# Patient Record
Sex: Female | Born: 1950 | Race: White | Hispanic: No | Smoking: Former smoker
Health system: Southern US, Community
[De-identification: ages and names within clinical notes are randomized; demographics above are authoritative.]

## PROBLEM LIST (undated history)

## (undated) DIAGNOSIS — T8859XA Other complications of anesthesia, initial encounter: Secondary | ICD-10-CM

## (undated) DIAGNOSIS — R251 Tremor, unspecified: Secondary | ICD-10-CM

## (undated) DIAGNOSIS — M1990 Unspecified osteoarthritis, unspecified site: Secondary | ICD-10-CM

## (undated) DIAGNOSIS — R519 Headache, unspecified: Secondary | ICD-10-CM

## (undated) DIAGNOSIS — G709 Myoneural disorder, unspecified: Secondary | ICD-10-CM

## (undated) DIAGNOSIS — Z9889 Other specified postprocedural states: Secondary | ICD-10-CM

## (undated) DIAGNOSIS — R112 Nausea with vomiting, unspecified: Secondary | ICD-10-CM

## (undated) DIAGNOSIS — F419 Anxiety disorder, unspecified: Secondary | ICD-10-CM

## (undated) DIAGNOSIS — K219 Gastro-esophageal reflux disease without esophagitis: Secondary | ICD-10-CM

## (undated) DIAGNOSIS — I10 Essential (primary) hypertension: Secondary | ICD-10-CM

## (undated) DIAGNOSIS — Z8719 Personal history of other diseases of the digestive system: Secondary | ICD-10-CM

## (undated) HISTORY — PX: BUNIONECTOMY: SHX129

## (undated) HISTORY — PX: COLONOSCOPY: SHX174

---

## 1998-06-17 ENCOUNTER — Other Ambulatory Visit: Admission: RE | Admit: 1998-06-17 | Discharge: 1998-06-17 | Payer: Self-pay | Admitting: Internal Medicine

## 2001-07-12 HISTORY — PX: OTHER SURGICAL HISTORY: SHX169

## 2008-07-12 DIAGNOSIS — Z8711 Personal history of peptic ulcer disease: Secondary | ICD-10-CM

## 2008-07-12 HISTORY — DX: Personal history of peptic ulcer disease: Z87.11

## 2008-12-11 ENCOUNTER — Encounter (INDEPENDENT_AMBULATORY_CARE_PROVIDER_SITE_OTHER): Payer: Self-pay | Admitting: *Deleted

## 2009-08-15 ENCOUNTER — Telehealth: Payer: Self-pay | Admitting: Internal Medicine

## 2010-02-09 ENCOUNTER — Encounter (INDEPENDENT_AMBULATORY_CARE_PROVIDER_SITE_OTHER): Payer: Self-pay | Admitting: *Deleted

## 2010-03-24 ENCOUNTER — Encounter: Payer: Self-pay | Admitting: Internal Medicine

## 2010-03-24 ENCOUNTER — Telehealth: Payer: Self-pay | Admitting: Internal Medicine

## 2010-04-03 ENCOUNTER — Encounter (INDEPENDENT_AMBULATORY_CARE_PROVIDER_SITE_OTHER): Payer: Self-pay | Admitting: *Deleted

## 2010-04-15 ENCOUNTER — Encounter (INDEPENDENT_AMBULATORY_CARE_PROVIDER_SITE_OTHER): Payer: Self-pay | Admitting: *Deleted

## 2010-04-16 ENCOUNTER — Ambulatory Visit: Payer: Self-pay | Admitting: Internal Medicine

## 2010-04-22 ENCOUNTER — Telehealth: Payer: Self-pay | Admitting: Internal Medicine

## 2010-08-11 NOTE — Letter (Signed)
Summary: Previsit letter  [facility]  [address] [place], [place] [address]   Phone: [phone]  Fax: [phone]       02/09/2010 MRN: [id number]  Mckenzie Guerrero [address] [place], [place]  [address]  [place]  Dear Ms. [name],  Welcome to the Gastroenterology Division at [facility].    You are scheduled to see a nurse for your pre-procedure visit on March 19, 2010 at 4:00pm on the 3rd floor at [facility], [address].  We ask that you try to arrive at our office 15 minutes prior to your appointment time to allow for check-in.  Your nurse visit will consist of discussing your medical and surgical history, your immediate family medical history, and your medications.    Please bring a complete list of all your medications or, if you prefer, bring the medication bottles and we will list them.  We will need to be aware of both prescribed and over the counter drugs.  We will need to know exact dosage information as well.  If you are on blood thinners (Coumadin, Plavix, Aggrenox, Ticlid, etc.) please call our office today/prior to your appointment, as we need to consult with your physician about holding your medication.   Please be prepared to read and sign documents such as consent forms, a financial agreement, and acknowledgement forms.  If necessary, and with your consent, a friend or relative is welcome to sit-in on the nurse visit with you.  Please bring your insurance card so that we may make a copy of it.  If your insurance requires a referral to see a specialist, please bring your referral form from your primary care physician.  No co-pay is required for this nurse visit.     If you cannot keep your appointment, please call [phone] to cancel or reschedule prior to your appointment date.  This allows [place] the opportunity to schedule an appointment for another patient in need of care.    Thank you for choosing [facility] for your  medical needs.  We appreciate the opportunity to care for you.  Please visit [place] at our website  to learn more about our practice.                     Sincerely.                                                                                                                   The Gastroenterology Division

## 2010-08-11 NOTE — Miscellaneous (Signed)
Summary: [facility] previsit  Clinical Lists Changes  Medications: Added new medication of MOVIPREP 100 GM  SOLR (PEG-KCL-NACL-NASULF-NA ASC-C) As per prep instructions. - Signed Rx of MOVIPREP 100 GM  SOLR (PEG-KCL-NACL-NASULF-NA ASC-C) As per prep instructions.;  #1 x 0;  Signed;  Entered by: Karl Bales RN;  Authorized by: Hart Carwin MD;  Method used: Electronically to [facility]. [phone]*, [address], [place], [place], [place]  [address], Ph: [phone] or [phone], Fax: [phone] Allergies: Added new allergy or adverse reaction of CODEINE Added new allergy or adverse reaction of STEROIDS Added new allergy or adverse reaction of * SULFA DRUGS Observations: Added new observation of NKA: F (04/16/2010 15:59)    Prescriptions: MOVIPREP 100 GM  SOLR (PEG-KCL-NACL-NASULF-NA ASC-C) As per prep instructions.  #1 x 0   Entered by:   Karl Bales RN   Authorized by:   Hart Carwin MD   Signed by:   Karl Bales RN on 04/16/2010   Method used:   Electronically to        [facility]. [phone]* (retail)       [address]       [place], [place]  [address]       Ph: [phone] or [phone]       Fax: [phone]   RxID:   [id number]

## 2010-08-11 NOTE — Progress Notes (Signed)
Summary: Schedule recall colon  Phone Note Outgoing Call Call back at [facility] Phone [phone]   Call placed by: Christie Nottingham CMA Duncan Dull),  August 15, 2009 10:45 AM Call placed to: Patient Summary of Call:  called pt on 08/05/09 and left message for pt  to call back to schedule recall colon Initial call taken by: Christie Nottingham CMA Duncan Dull),  August 15, 2009 10:45 AM  Follow-up for Phone Call        left message for pt  to call back  Follow-up by: Christie Nottingham CMA Duncan Dull),  August 15, 2009 10:47 AM

## 2010-08-11 NOTE — Progress Notes (Signed)
Summary: triage  Phone Note Call from Patient Call back at [phone]   Caller: Patient Call For: Dr. Juanda Chance Reason for Call: Talk to Nurse Summary of Call: would like to discuss dehydration and possible GI bleed Initial call taken by: Vallarie Mare,  April 22, 2010 12:12 PM  Follow-up for Phone Call        Patient  was seen in the ER for an inner ear infection and dehydration.  She was told that she is anemic and asked could her procedure be moved up.  They drew labs this am and will call her with the results tomorrow.  I have asked her to have her primary care office fax [place] the results of the blood work.  She will keep her colon as scheduled for 05/04/10 Follow-up by: Darcey Nora RN, CGRN,  April 22, 2010 1:26 PM

## 2010-08-11 NOTE — Letter (Signed)
Summary: New Patient letter  [facility]  [address] [place], [place] [address]   Phone: [phone]  Fax: [phone]       03/24/2010 MRN: [id number]  Mckenzie Guerrero [address] [place], [place]  [address]  [place]  Dear Ms. [name],  Welcome to the Gastroenterology Division at [facility].    You are scheduled to see Dr. Juanda Chance  on 04/03/10 at 10:15 a.m.  on the 3rd floor at [facility], [address].  We ask that you try to arrive at our office 15 minutes prior to your appointment time to allow for check-in.  We would like you to complete the enclosed self-administered evaluation form prior to your visit and bring it with you on the day of your appointment.  We will review it with you.  Also, please bring a complete list of all your medications or, if you prefer, bring the medication bottles and we will list them.  Please bring your insurance card so that we may make a copy of it.  If your insurance requires a referral to see a specialist, please bring your referral form from your primary care physician.  Co-payments are due at the time of your visit and may be paid by cash, check or credit card.     Your office visit will consist of a consult with your physician (includes a physical exam), any laboratory testing he/she may order, scheduling of any necessary diagnostic testing (e.g. x-ray, ultrasound, CT-scan), and scheduling of a procedure (e.g. Endoscopy, Colonoscopy) if required.  Please allow enough time on your schedule to allow for any/all of these possibilities.    If you cannot keep your appointment, please call [phone] to cancel or reschedule prior to your appointment date.  This allows [place] the opportunity to schedule an appointment for another patient in need of care.  If you do not cancel or reschedule by 5 p.m. the business day prior to your appointment date, you will be charged a $50.00 late cancellation/no-show fee.    Thank you for  choosing [facility] for your medical needs.  We appreciate the opportunity to care for you.  Please visit [place] at our website  to learn more about our practice.                     Sincerely,                                                             The Gastroenterology Division

## 2010-08-11 NOTE — Progress Notes (Signed)
Summary: hemorrhoids  Phone Note Call from Patient Call back at [phone]   Caller: Patient Call For: Dr. Juanda Chance Reason for Call: Talk to Nurse Summary of Call: hemorrhoids bothering pt and wants to know if she should see Dr. Juanda Chance or get something OTC Initial call taken by: Vallarie Mare,  March 24, 2010 3:14 PM  Follow-up for Phone Call        Patient  is scheduled to see Dr Juanda Chance on 04/03/10 10:00.  New patient letter sent to the patient  Follow-up by: Darcey Nora RN, CGRN,  March 24, 2010 3:36 PM

## 2010-08-11 NOTE — Letter (Signed)
Summary: Pre Visit Letter Revised  [facility]  [address] [place], [place] [address]   Phone: [phone]  Fax: [phone]        04/03/2010 MRN: [id number] Mckenzie Guerrero [address] [place], [place]  [address]  [place]             Procedure Date:  05/04/2010   Welcome to the Gastroenterology Division at [facility].    You are scheduled to see a nurse for your pre-procedure visit on 04/16/2010 at 4:30PM on the 3rd floor at [facility], [address].  We ask that you try to arrive at our office 15 minutes prior to your appointment time to allow for check-in.  Please take a minute to review the attached form.  If you answer "Yes" to one or more of the questions on the first page, we ask that you call the person listed at your earliest opportunity.  If you answer "No" to all of the questions, please complete the rest of the form and bring it to your appointment.    Your nurse visit will consist of discussing your medical and surgical history, your immediate family medical history, and your medications.   If you are unable to list all of your medications on the form, please bring the medication bottles to your appointment and we will list them.  We will need to be aware of both prescribed and over the counter drugs.  We will need to know exact dosage information as well.    Please be prepared to read and sign documents such as consent forms, a financial agreement, and acknowledgement forms.  If necessary, and with your consent, a friend or relative is welcome to sit-in on the nurse visit with you.  Please bring your insurance card so that we may make a copy of it.  If your insurance requires a referral to see a specialist, please bring your referral form from your primary care physician.  No co-pay is required for this nurse visit.     If you cannot keep your appointment, please call [phone] to cancel or reschedule prior to your appointment date.  This  allows [place] the opportunity to schedule an appointment for another patient in need of care.    Thank you for choosing  Gastroenterology for your medical needs.  We appreciate the opportunity to care for you.  Please visit [place] at our website  to learn more about our practice.  Sincerely, The Gastroenterology Division

## 2010-08-11 NOTE — Letter (Signed)
Summary: [facility] Instructions  [facility]  [address] [place], [place] [address]   Phone: [phone]  Fax: [phone]       Mckenzie Guerrero    1951-07-08    MRN: [id number]        Procedure Day /Date: Monday 05-04-10     Arrival Time: 1:30 p.m.     Procedure Time: 2:30 p.m.     Location of Procedure:                      [facility] (4th Floor)  PREPARATION FOR COLONOSCOPY WITH MOVIPREP   Starting 5 days prior to your procedure  04-29-10 do not eat nuts, seeds, popcorn, corn, beans, peas,  salads, or any raw vegetables.  Do not take any fiber supplements (e.g. Metamucil, Citrucel, and Benefiber).  THE DAY BEFORE YOUR PROCEDURE         DATE:  05-03-10   DAY:  Sunday  1.  Drink clear liquids the entire day-NO SOLID FOOD  2.  Do not drink anything colored red or purple.  Avoid juices with pulp.  No orange juice.  3.  Drink at least 64 oz. (8 glasses) of fluid/clear liquids during the day to prevent dehydration and help the prep work efficiently.  CLEAR LIQUIDS INCLUDE: Water Jello Ice Popsicles Tea (sugar ok, no milk/cream) Powdered fruit flavored drinks Coffee (sugar ok, no milk/cream) Gatorade Juice: apple, white grape, white cranberry  Lemonade Clear bullion, consomm, broth Carbonated beverages (any kind) Strained chicken noodle soup Hard Candy                             4.  In the morning, mix first dose of MoviPrep solution:    Empty 1 Pouch A and 1 Pouch B into the disposable container    Add lukewarm drinking water to the top line of the container. Mix to dissolve    Refrigerate (mixed solution should be used within 24 hrs)  5.  Begin drinking the prep at 5:00 p.m. The MoviPrep container is divided by 4 marks.   Every 15 minutes drink the solution down to the next mark (approximately 8 oz) until the full liter is complete.   6.  Follow completed prep with 16 oz of clear liquid of your choice (Nothing red or purple).   Continue to drink clear liquids until bedtime.  7.  Before going to bed, mix second dose of MoviPrep solution:    Empty 1 Pouch A and 1 Pouch B into the disposable container    Add lukewarm drinking water to the top line of the container. Mix to dissolve    Refrigerate  THE DAY OF YOUR PROCEDURE      DATE:  05-04-10   DAY: Monday  Beginning at  9:30 a.m. (5 hours before procedure):         1. Every 15 minutes, drink the solution down to the next mark (approx 8 oz) until the full liter is complete.  2. Follow completed prep with 16 oz. of clear liquid of your choice.    3. You may drink clear liquids until  12:30 p.m. (2 HOURS BEFORE PROCEDURE).   MEDICATION INSTRUCTIONS  Unless otherwise instructed, you should take regular prescription medications with a small sip of water   as early as possible the morning of your procedure.  _         OTHER INSTRUCTIONS  You will need a responsible adult at least 60 years of age to accompany you and drive you home.   This person must remain in the waiting room during your procedure.  Wear loose fitting clothing that is easily removed.  Leave jewelry and other valuables at home.  However, you may wish to bring a book to read or  an iPod/MP3 player to listen to music as you wait for your procedure to start.  Remove all body piercing jewelry and leave at home.  Total time from sign-in until discharge is approximately 2-3 hours.  You should go home directly after your procedure and rest.  You can resume normal activities the  day after your procedure.  The day of your procedure you should not:   Drive   Make legal decisions   Operate machinery   Drink alcohol   Return to work  You will receive specific instructions about eating, activities and medications before you leave.    The above instructions have been reviewed and explained to me by   Karl Bales RN  April 16, 2010 4:49 PM    I fully understand and can  verbalize these instructions _____________________________ Date _________

## 2012-01-24 ENCOUNTER — Encounter: Payer: Self-pay | Admitting: Internal Medicine

## 2016-01-20 DIAGNOSIS — H25813 Combined forms of age-related cataract, bilateral: Secondary | ICD-10-CM | POA: Diagnosis not present

## 2016-01-20 DIAGNOSIS — H401122 Primary open-angle glaucoma, left eye, moderate stage: Secondary | ICD-10-CM | POA: Diagnosis not present

## 2016-01-20 DIAGNOSIS — H40011 Open angle with borderline findings, low risk, right eye: Secondary | ICD-10-CM | POA: Diagnosis not present

## 2016-03-12 DIAGNOSIS — I10 Essential (primary) hypertension: Secondary | ICD-10-CM | POA: Diagnosis not present

## 2016-03-12 DIAGNOSIS — H6123 Impacted cerumen, bilateral: Secondary | ICD-10-CM | POA: Diagnosis not present

## 2016-03-12 DIAGNOSIS — J0140 Acute pansinusitis, unspecified: Secondary | ICD-10-CM | POA: Diagnosis not present

## 2016-03-16 DIAGNOSIS — H401122 Primary open-angle glaucoma, left eye, moderate stage: Secondary | ICD-10-CM | POA: Diagnosis not present

## 2016-03-16 DIAGNOSIS — H25813 Combined forms of age-related cataract, bilateral: Secondary | ICD-10-CM | POA: Diagnosis not present

## 2016-03-16 DIAGNOSIS — H40011 Open angle with borderline findings, low risk, right eye: Secondary | ICD-10-CM | POA: Diagnosis not present

## 2016-05-03 DIAGNOSIS — Z111 Encounter for screening for respiratory tuberculosis: Secondary | ICD-10-CM | POA: Diagnosis not present

## 2016-05-03 DIAGNOSIS — Z23 Encounter for immunization: Secondary | ICD-10-CM | POA: Diagnosis not present

## 2016-09-16 DIAGNOSIS — H40013 Open angle with borderline findings, low risk, bilateral: Secondary | ICD-10-CM | POA: Diagnosis not present

## 2016-09-16 DIAGNOSIS — H25813 Combined forms of age-related cataract, bilateral: Secondary | ICD-10-CM | POA: Diagnosis not present

## 2016-10-29 DIAGNOSIS — R251 Tremor, unspecified: Secondary | ICD-10-CM | POA: Diagnosis not present

## 2016-10-29 DIAGNOSIS — E7800 Pure hypercholesterolemia, unspecified: Secondary | ICD-10-CM | POA: Diagnosis not present

## 2016-10-29 DIAGNOSIS — R002 Palpitations: Secondary | ICD-10-CM | POA: Diagnosis not present

## 2016-10-29 DIAGNOSIS — R001 Bradycardia, unspecified: Secondary | ICD-10-CM | POA: Diagnosis not present

## 2016-10-29 DIAGNOSIS — F5101 Primary insomnia: Secondary | ICD-10-CM | POA: Diagnosis not present

## 2016-10-29 DIAGNOSIS — F320 Major depressive disorder, single episode, mild: Secondary | ICD-10-CM | POA: Diagnosis not present

## 2016-10-29 DIAGNOSIS — I10 Essential (primary) hypertension: Secondary | ICD-10-CM | POA: Diagnosis not present

## 2016-10-29 DIAGNOSIS — K219 Gastro-esophageal reflux disease without esophagitis: Secondary | ICD-10-CM | POA: Diagnosis not present

## 2016-10-29 DIAGNOSIS — G43709 Chronic migraine without aura, not intractable, without status migrainosus: Secondary | ICD-10-CM | POA: Diagnosis not present

## 2017-03-10 DIAGNOSIS — M1712 Unilateral primary osteoarthritis, left knee: Secondary | ICD-10-CM | POA: Diagnosis not present

## 2017-03-17 DIAGNOSIS — M2242 Chondromalacia patellae, left knee: Secondary | ICD-10-CM | POA: Diagnosis not present

## 2017-04-08 DIAGNOSIS — H25813 Combined forms of age-related cataract, bilateral: Secondary | ICD-10-CM | POA: Diagnosis not present

## 2017-04-08 DIAGNOSIS — H40013 Open angle with borderline findings, low risk, bilateral: Secondary | ICD-10-CM | POA: Diagnosis not present

## 2017-04-11 ENCOUNTER — Encounter: Payer: Self-pay | Admitting: Podiatry

## 2017-04-11 ENCOUNTER — Ambulatory Visit (INDEPENDENT_AMBULATORY_CARE_PROVIDER_SITE_OTHER): Payer: PPO | Admitting: Podiatry

## 2017-04-11 ENCOUNTER — Encounter (INDEPENDENT_AMBULATORY_CARE_PROVIDER_SITE_OTHER): Payer: Self-pay

## 2017-04-11 ENCOUNTER — Ambulatory Visit (INDEPENDENT_AMBULATORY_CARE_PROVIDER_SITE_OTHER): Payer: PPO

## 2017-04-11 VITALS — BP 139/85 | HR 66 | Ht 63.0 in | Wt 153.0 lb

## 2017-04-11 DIAGNOSIS — M779 Enthesopathy, unspecified: Secondary | ICD-10-CM

## 2017-04-11 DIAGNOSIS — M21962 Unspecified acquired deformity of left lower leg: Secondary | ICD-10-CM

## 2017-04-11 DIAGNOSIS — M7742 Metatarsalgia, left foot: Secondary | ICD-10-CM | POA: Diagnosis not present

## 2017-04-11 MED ORDER — DICLOFENAC SODIUM 1 % TD GEL: 2.0000 g | Freq: Four times a day (QID) | TRANSDERMAL | 0 refills | Status: DC

## 2017-04-11 NOTE — Progress Notes (Signed)
   Subjective:    Patient ID: Mckenzie Guerrero, female    DOB: 1950/08/10, 66 y.o.   MRN: [id number] Chief Complaint  Patient presents with  . Foot Pain    pain in ball of left foot hx 3-4 mo gradually getting worse internittent pain     HPI  I have been having pain in the ball of my left foot for 3 or 4 months.   Reports pain in the ball of the left foot pain occurs in all shoe gear. Your bunion correction by Dr. Amalia Hailey greater than 10 years ago. Complains she believes her left second toe is starting to overlap the great toe.Denies other pedal issues  66 y.o. female presents with the above complaint.  No past medical history on file. No past surgical history on file.  Current Outpatient Prescriptions:  .  ALPRAZolam (XANAX) 0.5 MG tablet, Take one-half to one by mouth at HS prn, Disp: , Rfl:  .  omeprazole (PRILOSEC) 20 MG capsule, TAKE ONE CAPSULE BY MOUTH TWICE DAILY, Disp: , Rfl:  .  ramipril (ALTACE) 5 MG capsule, TAKE 1 CAPSULE BY MOUTH ONCE DAILY, Disp: , Rfl:  .  SUMAtriptan (IMITREX) 50 MG tablet, TAKE 1 TABLET BY MOUTH FOR MIGRAINE HEADACHE. MAY REPEAT EVERY 2 HOURS. MAXIMUM OF 4 TABLETS DAILY., Disp: , Rfl:  .  diclofenac sodium (VOLTAREN) 1 % GEL, Apply 2 g topically 4 (four) times daily. To painful areas., Disp: 100 g, Rfl: 0  Allergies  Allergen Reactions  . Codeine     REACTION: itch and bumps  . Doxycycline Nausea And Vomiting  . Nsaids Other (See Comments)    Gi bleeding  . Sulfonamide Derivatives     REACTION: swelling     Review of Systems  HENT: Positive for hearing loss and tinnitus.   Neurological: Positive for tremors.  All other systems reviewed and are negative.     Objective:   Physical Exam Vitals:   04/11/17 1413  BP: 139/85  Pulse: 66     General AA&O x3. Normal mood and affect.  Vascular Dorsalis pedis and posterior tibial pulses  present 2+ bilaterally  Capillary refill normal to all digits. Pedal hair growth normal.  Neurologic  Epicritic sensation grossly present.  Dermatologic No open lesions. Interspaces clear of maceration. Nails well groomed and normal in appearance. No hyperkeratotic lesions   Orthopedic: MMT 5/5 in dorsiflexion, plantarflexion, inversion, and eversion. Normal joint ROM without pain or crepitus. Pain on palpation plantar left second third fourth metatarsal heads evidence of prior bunion correction left foot. Residual HAV deformity present. No pain on range of motion left first MPJ. Decreased range of motion noted to the left first MPJ      Radiographs: Taken and reviewed. No acute fractures or dislocations. Assessment & Plan:  Capsulitis/metatarsalgia left -X-rays reviewed with the patient -Discussed etiology including abnormal metatarsal parabola -Metatarsal pads dispensed -Voltaren gel eRxed. Patient states that she cannot take oral NSAIDs -If pain is alleviated by padding will consider custom molded orthotics with metatarsal pads -Ultimately patient would benefit from second third and fourth metatarsal osteotomies to realign the metatarsal parabola. Would consider concomitant bunion revision and second hammertoe correction due to overlapping second toe deformity. Patient wishes to avoid surgery at this time  Return in about 3 weeks (around 05/02/2017).

## 2017-04-19 DIAGNOSIS — Z23 Encounter for immunization: Secondary | ICD-10-CM | POA: Diagnosis not present

## 2017-05-03 ENCOUNTER — Ambulatory Visit (INDEPENDENT_AMBULATORY_CARE_PROVIDER_SITE_OTHER): Payer: PPO | Admitting: Podiatry

## 2017-05-03 DIAGNOSIS — M7742 Metatarsalgia, left foot: Secondary | ICD-10-CM | POA: Diagnosis not present

## 2017-05-03 DIAGNOSIS — M779 Enthesopathy, unspecified: Secondary | ICD-10-CM | POA: Diagnosis not present

## 2017-05-03 NOTE — Progress Notes (Signed)
  Subjective:  Patient ID: Mckenzie Guerrero, female    DOB: [birth year],  MRN: [id number]  Chief Complaint  Patient presents with  . Foot Problem    Pain in the left foot is not any better    66 y.o. female returns for the above complaint.  Her pain is not any better.  The Voltaren gel has not been working.  Unable to take oral NSAIDs . Complains of bruising near her big toenail from her first and second toes rubbing  Objective:   General AA&O x3. Normal mood and affect.  Vascular Foot warm and well-perfused  Neurologic Epicritic sensation grossly present.  Dermatologic  skin irritation lateral aspect of the left hallux  Orthopedic: Pain on palpation plantar left second third fourth metatarsal heads evidence of prior bunion correction left foot. Residual HAV deformity present. No pain on range of motion left first MPJ. Decreased range of motion noted to the left first MPJ      Assessment & Plan:  Patient was evaluated and treated and all questions answered.  Capsulitis/metatarsalgia; HAV deformity -Again discussed with custom molded orthotics.  Patient declined. -Patient was offered prefabricated orthotics.  Patient declined. -Patient was offered surgical intervention.  Patient declined. -Padding dispensed including metatarsal pad and bunion shield left foot -Discussed with patient that due to structural deformities the options for alleviating her pain include accommodating for areas of high pressure with padding and/or orthotics versus realignment of the bones with surgery.  Patient declines both options.  Discussed with patient that there is not much more I can offer her system alleviating her pain since she has failed padding, anti-inflammatory medicines, shoegear changes.  15 minutes of face to face time were spent with the patient. >50% of this was spent on counseling and coordination of care. Specifically discussed with patient the above treatment plan.  Patient declining suggested  therapy.  Advised that pain will likely persist without intervention.  Patient verbalized understanding.

## 2017-05-05 DIAGNOSIS — Z111 Encounter for screening for respiratory tuberculosis: Secondary | ICD-10-CM | POA: Diagnosis not present

## 2017-06-06 DIAGNOSIS — I10 Essential (primary) hypertension: Secondary | ICD-10-CM | POA: Diagnosis not present

## 2017-06-06 DIAGNOSIS — H66001 Acute suppurative otitis media without spontaneous rupture of ear drum, right ear: Secondary | ICD-10-CM | POA: Diagnosis not present

## 2017-06-06 DIAGNOSIS — J0100 Acute maxillary sinusitis, unspecified: Secondary | ICD-10-CM | POA: Diagnosis not present

## 2017-06-06 DIAGNOSIS — H65192 Other acute nonsuppurative otitis media, left ear: Secondary | ICD-10-CM | POA: Diagnosis not present

## 2017-08-31 DIAGNOSIS — I10 Essential (primary) hypertension: Secondary | ICD-10-CM | POA: Diagnosis not present

## 2017-08-31 DIAGNOSIS — J3089 Other allergic rhinitis: Secondary | ICD-10-CM | POA: Diagnosis not present

## 2017-10-03 DIAGNOSIS — Z79899 Other long term (current) drug therapy: Secondary | ICD-10-CM | POA: Diagnosis not present

## 2017-10-03 DIAGNOSIS — R5383 Other fatigue: Secondary | ICD-10-CM | POA: Diagnosis not present

## 2017-10-03 DIAGNOSIS — E7800 Pure hypercholesterolemia, unspecified: Secondary | ICD-10-CM | POA: Diagnosis not present

## 2017-10-03 DIAGNOSIS — I10 Essential (primary) hypertension: Secondary | ICD-10-CM | POA: Diagnosis not present

## 2017-10-03 DIAGNOSIS — G43709 Chronic migraine without aura, not intractable, without status migrainosus: Secondary | ICD-10-CM | POA: Diagnosis not present

## 2017-10-03 DIAGNOSIS — K219 Gastro-esophageal reflux disease without esophagitis: Secondary | ICD-10-CM | POA: Diagnosis not present

## 2017-10-03 DIAGNOSIS — R5381 Other malaise: Secondary | ICD-10-CM | POA: Diagnosis not present

## 2017-10-03 DIAGNOSIS — F320 Major depressive disorder, single episode, mild: Secondary | ICD-10-CM | POA: Diagnosis not present

## 2017-10-06 DIAGNOSIS — H40013 Open angle with borderline findings, low risk, bilateral: Secondary | ICD-10-CM | POA: Diagnosis not present

## 2017-10-06 DIAGNOSIS — H25813 Combined forms of age-related cataract, bilateral: Secondary | ICD-10-CM | POA: Diagnosis not present

## 2017-11-16 DIAGNOSIS — M546 Pain in thoracic spine: Secondary | ICD-10-CM | POA: Diagnosis not present

## 2017-11-16 DIAGNOSIS — M545 Low back pain: Secondary | ICD-10-CM | POA: Diagnosis not present

## 2017-11-24 DIAGNOSIS — M4306 Spondylolysis, lumbar region: Secondary | ICD-10-CM | POA: Diagnosis not present

## 2017-11-24 DIAGNOSIS — M9905 Segmental and somatic dysfunction of pelvic region: Secondary | ICD-10-CM | POA: Diagnosis not present

## 2017-11-24 DIAGNOSIS — M4304 Spondylolysis, thoracic region: Secondary | ICD-10-CM | POA: Diagnosis not present

## 2017-11-24 DIAGNOSIS — M4308 Spondylolysis, sacral and sacrococcygeal region: Secondary | ICD-10-CM | POA: Diagnosis not present

## 2017-11-24 DIAGNOSIS — M9902 Segmental and somatic dysfunction of thoracic region: Secondary | ICD-10-CM | POA: Diagnosis not present

## 2017-11-24 DIAGNOSIS — M9903 Segmental and somatic dysfunction of lumbar region: Secondary | ICD-10-CM | POA: Diagnosis not present

## 2017-11-29 DIAGNOSIS — M9903 Segmental and somatic dysfunction of lumbar region: Secondary | ICD-10-CM | POA: Diagnosis not present

## 2017-11-29 DIAGNOSIS — M9902 Segmental and somatic dysfunction of thoracic region: Secondary | ICD-10-CM | POA: Diagnosis not present

## 2017-11-29 DIAGNOSIS — M9905 Segmental and somatic dysfunction of pelvic region: Secondary | ICD-10-CM | POA: Diagnosis not present

## 2017-11-29 DIAGNOSIS — M4304 Spondylolysis, thoracic region: Secondary | ICD-10-CM | POA: Diagnosis not present

## 2017-11-29 DIAGNOSIS — M4308 Spondylolysis, sacral and sacrococcygeal region: Secondary | ICD-10-CM | POA: Diagnosis not present

## 2017-11-29 DIAGNOSIS — M4306 Spondylolysis, lumbar region: Secondary | ICD-10-CM | POA: Diagnosis not present

## 2017-12-06 DIAGNOSIS — M4308 Spondylolysis, sacral and sacrococcygeal region: Secondary | ICD-10-CM | POA: Diagnosis not present

## 2017-12-06 DIAGNOSIS — M4304 Spondylolysis, thoracic region: Secondary | ICD-10-CM | POA: Diagnosis not present

## 2017-12-06 DIAGNOSIS — M9903 Segmental and somatic dysfunction of lumbar region: Secondary | ICD-10-CM | POA: Diagnosis not present

## 2017-12-06 DIAGNOSIS — M9905 Segmental and somatic dysfunction of pelvic region: Secondary | ICD-10-CM | POA: Diagnosis not present

## 2017-12-06 DIAGNOSIS — M4306 Spondylolysis, lumbar region: Secondary | ICD-10-CM | POA: Diagnosis not present

## 2017-12-06 DIAGNOSIS — M9902 Segmental and somatic dysfunction of thoracic region: Secondary | ICD-10-CM | POA: Diagnosis not present

## 2017-12-22 DIAGNOSIS — M4306 Spondylolysis, lumbar region: Secondary | ICD-10-CM | POA: Diagnosis not present

## 2017-12-22 DIAGNOSIS — M9902 Segmental and somatic dysfunction of thoracic region: Secondary | ICD-10-CM | POA: Diagnosis not present

## 2017-12-22 DIAGNOSIS — M9905 Segmental and somatic dysfunction of pelvic region: Secondary | ICD-10-CM | POA: Diagnosis not present

## 2017-12-22 DIAGNOSIS — M9903 Segmental and somatic dysfunction of lumbar region: Secondary | ICD-10-CM | POA: Diagnosis not present

## 2017-12-22 DIAGNOSIS — M4308 Spondylolysis, sacral and sacrococcygeal region: Secondary | ICD-10-CM | POA: Diagnosis not present

## 2017-12-22 DIAGNOSIS — M4304 Spondylolysis, thoracic region: Secondary | ICD-10-CM | POA: Diagnosis not present

## 2018-01-10 DIAGNOSIS — M5388 Other specified dorsopathies, sacral and sacrococcygeal region: Secondary | ICD-10-CM | POA: Diagnosis not present

## 2018-01-10 DIAGNOSIS — M4724 Other spondylosis with radiculopathy, thoracic region: Secondary | ICD-10-CM | POA: Diagnosis not present

## 2018-01-10 DIAGNOSIS — M9903 Segmental and somatic dysfunction of lumbar region: Secondary | ICD-10-CM | POA: Diagnosis not present

## 2018-01-10 DIAGNOSIS — M4726 Other spondylosis with radiculopathy, lumbar region: Secondary | ICD-10-CM | POA: Diagnosis not present

## 2018-01-10 DIAGNOSIS — M9904 Segmental and somatic dysfunction of sacral region: Secondary | ICD-10-CM | POA: Diagnosis not present

## 2018-01-10 DIAGNOSIS — M9902 Segmental and somatic dysfunction of thoracic region: Secondary | ICD-10-CM | POA: Diagnosis not present

## 2018-02-15 DIAGNOSIS — M5388 Other specified dorsopathies, sacral and sacrococcygeal region: Secondary | ICD-10-CM | POA: Diagnosis not present

## 2018-02-15 DIAGNOSIS — M9904 Segmental and somatic dysfunction of sacral region: Secondary | ICD-10-CM | POA: Diagnosis not present

## 2018-02-15 DIAGNOSIS — M9902 Segmental and somatic dysfunction of thoracic region: Secondary | ICD-10-CM | POA: Diagnosis not present

## 2018-02-15 DIAGNOSIS — M4306 Spondylolysis, lumbar region: Secondary | ICD-10-CM | POA: Diagnosis not present

## 2018-02-15 DIAGNOSIS — M4304 Spondylolysis, thoracic region: Secondary | ICD-10-CM | POA: Diagnosis not present

## 2018-02-15 DIAGNOSIS — M9903 Segmental and somatic dysfunction of lumbar region: Secondary | ICD-10-CM | POA: Diagnosis not present

## 2018-03-09 DIAGNOSIS — L570 Actinic keratosis: Secondary | ICD-10-CM | POA: Diagnosis not present

## 2018-03-09 DIAGNOSIS — C4441 Basal cell carcinoma of skin of scalp and neck: Secondary | ICD-10-CM | POA: Diagnosis not present

## 2018-03-09 DIAGNOSIS — L209 Atopic dermatitis, unspecified: Secondary | ICD-10-CM | POA: Diagnosis not present

## 2018-03-09 DIAGNOSIS — D2239 Melanocytic nevi of other parts of face: Secondary | ICD-10-CM | POA: Diagnosis not present

## 2018-03-09 DIAGNOSIS — C44311 Basal cell carcinoma of skin of nose: Secondary | ICD-10-CM | POA: Diagnosis not present

## 2018-03-23 DIAGNOSIS — Z111 Encounter for screening for respiratory tuberculosis: Secondary | ICD-10-CM | POA: Diagnosis not present

## 2018-03-30 DIAGNOSIS — M9901 Segmental and somatic dysfunction of cervical region: Secondary | ICD-10-CM | POA: Diagnosis not present

## 2018-03-30 DIAGNOSIS — M4306 Spondylolysis, lumbar region: Secondary | ICD-10-CM | POA: Diagnosis not present

## 2018-03-30 DIAGNOSIS — M4722 Other spondylosis with radiculopathy, cervical region: Secondary | ICD-10-CM | POA: Diagnosis not present

## 2018-03-30 DIAGNOSIS — M9903 Segmental and somatic dysfunction of lumbar region: Secondary | ICD-10-CM | POA: Diagnosis not present

## 2018-03-30 DIAGNOSIS — M4304 Spondylolysis, thoracic region: Secondary | ICD-10-CM | POA: Diagnosis not present

## 2018-03-30 DIAGNOSIS — M9902 Segmental and somatic dysfunction of thoracic region: Secondary | ICD-10-CM | POA: Diagnosis not present

## 2018-04-04 DIAGNOSIS — D044 Carcinoma in situ of skin of scalp and neck: Secondary | ICD-10-CM | POA: Diagnosis not present

## 2018-04-06 DIAGNOSIS — H40013 Open angle with borderline findings, low risk, bilateral: Secondary | ICD-10-CM | POA: Diagnosis not present

## 2018-04-06 DIAGNOSIS — H25813 Combined forms of age-related cataract, bilateral: Secondary | ICD-10-CM | POA: Diagnosis not present

## 2018-04-10 DIAGNOSIS — E7800 Pure hypercholesterolemia, unspecified: Secondary | ICD-10-CM | POA: Diagnosis not present

## 2018-04-10 DIAGNOSIS — I10 Essential (primary) hypertension: Secondary | ICD-10-CM | POA: Diagnosis not present

## 2018-04-10 DIAGNOSIS — G43709 Chronic migraine without aura, not intractable, without status migrainosus: Secondary | ICD-10-CM | POA: Diagnosis not present

## 2018-04-10 DIAGNOSIS — Z23 Encounter for immunization: Secondary | ICD-10-CM | POA: Diagnosis not present

## 2018-04-10 DIAGNOSIS — K219 Gastro-esophageal reflux disease without esophagitis: Secondary | ICD-10-CM | POA: Diagnosis not present

## 2018-04-10 DIAGNOSIS — R5381 Other malaise: Secondary | ICD-10-CM | POA: Diagnosis not present

## 2018-04-10 DIAGNOSIS — R5383 Other fatigue: Secondary | ICD-10-CM | POA: Diagnosis not present

## 2018-04-10 DIAGNOSIS — F5101 Primary insomnia: Secondary | ICD-10-CM | POA: Diagnosis not present

## 2018-04-10 DIAGNOSIS — J3089 Other allergic rhinitis: Secondary | ICD-10-CM | POA: Diagnosis not present

## 2018-04-10 DIAGNOSIS — R251 Tremor, unspecified: Secondary | ICD-10-CM | POA: Diagnosis not present

## 2018-04-10 DIAGNOSIS — Z0000 Encounter for general adult medical examination without abnormal findings: Secondary | ICD-10-CM | POA: Diagnosis not present

## 2018-04-10 DIAGNOSIS — F320 Major depressive disorder, single episode, mild: Secondary | ICD-10-CM | POA: Diagnosis not present

## 2018-04-24 DIAGNOSIS — C44311 Basal cell carcinoma of skin of nose: Secondary | ICD-10-CM | POA: Diagnosis not present

## 2018-05-16 DIAGNOSIS — M4306 Spondylolysis, lumbar region: Secondary | ICD-10-CM | POA: Diagnosis not present

## 2018-05-16 DIAGNOSIS — M9902 Segmental and somatic dysfunction of thoracic region: Secondary | ICD-10-CM | POA: Diagnosis not present

## 2018-05-16 DIAGNOSIS — M9903 Segmental and somatic dysfunction of lumbar region: Secondary | ICD-10-CM | POA: Diagnosis not present

## 2018-05-16 DIAGNOSIS — M4304 Spondylolysis, thoracic region: Secondary | ICD-10-CM | POA: Diagnosis not present

## 2018-05-16 DIAGNOSIS — M9904 Segmental and somatic dysfunction of sacral region: Secondary | ICD-10-CM | POA: Diagnosis not present

## 2018-05-16 DIAGNOSIS — M5388 Other specified dorsopathies, sacral and sacrococcygeal region: Secondary | ICD-10-CM | POA: Diagnosis not present

## 2018-08-03 DIAGNOSIS — M4307 Spondylolysis, lumbosacral region: Secondary | ICD-10-CM | POA: Diagnosis not present

## 2018-08-03 DIAGNOSIS — M9902 Segmental and somatic dysfunction of thoracic region: Secondary | ICD-10-CM | POA: Diagnosis not present

## 2018-08-03 DIAGNOSIS — M4305 Spondylolysis, thoracolumbar region: Secondary | ICD-10-CM | POA: Diagnosis not present

## 2018-08-03 DIAGNOSIS — M9904 Segmental and somatic dysfunction of sacral region: Secondary | ICD-10-CM | POA: Diagnosis not present

## 2018-08-03 DIAGNOSIS — M5388 Other specified dorsopathies, sacral and sacrococcygeal region: Secondary | ICD-10-CM | POA: Diagnosis not present

## 2018-08-03 DIAGNOSIS — M9903 Segmental and somatic dysfunction of lumbar region: Secondary | ICD-10-CM | POA: Diagnosis not present

## 2018-08-17 DIAGNOSIS — J0100 Acute maxillary sinusitis, unspecified: Secondary | ICD-10-CM | POA: Diagnosis not present

## 2018-09-18 DIAGNOSIS — R0981 Nasal congestion: Secondary | ICD-10-CM | POA: Diagnosis not present

## 2018-09-18 DIAGNOSIS — R05 Cough: Secondary | ICD-10-CM | POA: Diagnosis not present

## 2018-09-25 DIAGNOSIS — M9902 Segmental and somatic dysfunction of thoracic region: Secondary | ICD-10-CM | POA: Diagnosis not present

## 2018-09-25 DIAGNOSIS — M9901 Segmental and somatic dysfunction of cervical region: Secondary | ICD-10-CM | POA: Diagnosis not present

## 2018-09-25 DIAGNOSIS — M4304 Spondylolysis, thoracic region: Secondary | ICD-10-CM | POA: Diagnosis not present

## 2018-09-25 DIAGNOSIS — M4306 Spondylolysis, lumbar region: Secondary | ICD-10-CM | POA: Diagnosis not present

## 2018-09-25 DIAGNOSIS — M9903 Segmental and somatic dysfunction of lumbar region: Secondary | ICD-10-CM | POA: Diagnosis not present

## 2018-09-25 DIAGNOSIS — M4302 Spondylolysis, cervical region: Secondary | ICD-10-CM | POA: Diagnosis not present

## 2018-11-13 DIAGNOSIS — J0100 Acute maxillary sinusitis, unspecified: Secondary | ICD-10-CM | POA: Diagnosis not present

## 2018-11-28 DIAGNOSIS — M4302 Spondylolysis, cervical region: Secondary | ICD-10-CM | POA: Diagnosis not present

## 2018-11-28 DIAGNOSIS — M4306 Spondylolysis, lumbar region: Secondary | ICD-10-CM | POA: Diagnosis not present

## 2018-11-28 DIAGNOSIS — M9902 Segmental and somatic dysfunction of thoracic region: Secondary | ICD-10-CM | POA: Diagnosis not present

## 2018-11-28 DIAGNOSIS — M4304 Spondylolysis, thoracic region: Secondary | ICD-10-CM | POA: Diagnosis not present

## 2018-11-28 DIAGNOSIS — M9903 Segmental and somatic dysfunction of lumbar region: Secondary | ICD-10-CM | POA: Diagnosis not present

## 2018-11-28 DIAGNOSIS — M9901 Segmental and somatic dysfunction of cervical region: Secondary | ICD-10-CM | POA: Diagnosis not present

## 2019-01-01 DIAGNOSIS — E7800 Pure hypercholesterolemia, unspecified: Secondary | ICD-10-CM | POA: Diagnosis not present

## 2019-01-01 DIAGNOSIS — R5381 Other malaise: Secondary | ICD-10-CM | POA: Diagnosis not present

## 2019-01-01 DIAGNOSIS — I10 Essential (primary) hypertension: Secondary | ICD-10-CM | POA: Diagnosis not present

## 2019-01-01 DIAGNOSIS — R5383 Other fatigue: Secondary | ICD-10-CM | POA: Diagnosis not present

## 2019-01-01 DIAGNOSIS — M4722 Other spondylosis with radiculopathy, cervical region: Secondary | ICD-10-CM | POA: Diagnosis not present

## 2019-01-30 DIAGNOSIS — I10 Essential (primary) hypertension: Secondary | ICD-10-CM | POA: Diagnosis not present

## 2019-01-30 DIAGNOSIS — F5101 Primary insomnia: Secondary | ICD-10-CM | POA: Diagnosis not present

## 2019-01-30 DIAGNOSIS — G43709 Chronic migraine without aura, not intractable, without status migrainosus: Secondary | ICD-10-CM | POA: Diagnosis not present

## 2019-01-30 DIAGNOSIS — J0140 Acute pansinusitis, unspecified: Secondary | ICD-10-CM | POA: Diagnosis not present

## 2019-03-01 DIAGNOSIS — H40013 Open angle with borderline findings, low risk, bilateral: Secondary | ICD-10-CM | POA: Diagnosis not present

## 2019-03-01 DIAGNOSIS — H25813 Combined forms of age-related cataract, bilateral: Secondary | ICD-10-CM | POA: Diagnosis not present

## 2019-03-06 DIAGNOSIS — M19022 Primary osteoarthritis, left elbow: Secondary | ICD-10-CM | POA: Diagnosis not present

## 2019-03-06 DIAGNOSIS — I10 Essential (primary) hypertension: Secondary | ICD-10-CM | POA: Diagnosis not present

## 2019-03-06 DIAGNOSIS — M4722 Other spondylosis with radiculopathy, cervical region: Secondary | ICD-10-CM | POA: Diagnosis not present

## 2019-03-06 DIAGNOSIS — Z111 Encounter for screening for respiratory tuberculosis: Secondary | ICD-10-CM | POA: Diagnosis not present

## 2019-03-06 DIAGNOSIS — K219 Gastro-esophageal reflux disease without esophagitis: Secondary | ICD-10-CM | POA: Diagnosis not present

## 2019-03-06 DIAGNOSIS — R5381 Other malaise: Secondary | ICD-10-CM | POA: Diagnosis not present

## 2019-03-06 DIAGNOSIS — Z87891 Personal history of nicotine dependence: Secondary | ICD-10-CM | POA: Diagnosis not present

## 2019-03-06 DIAGNOSIS — M19021 Primary osteoarthritis, right elbow: Secondary | ICD-10-CM | POA: Diagnosis not present

## 2019-03-06 DIAGNOSIS — R5383 Other fatigue: Secondary | ICD-10-CM | POA: Diagnosis not present

## 2019-03-29 DIAGNOSIS — M9901 Segmental and somatic dysfunction of cervical region: Secondary | ICD-10-CM | POA: Diagnosis not present

## 2019-03-29 DIAGNOSIS — M4724 Other spondylosis with radiculopathy, thoracic region: Secondary | ICD-10-CM | POA: Diagnosis not present

## 2019-03-29 DIAGNOSIS — M5388 Other specified dorsopathies, sacral and sacrococcygeal region: Secondary | ICD-10-CM | POA: Diagnosis not present

## 2019-03-29 DIAGNOSIS — M9902 Segmental and somatic dysfunction of thoracic region: Secondary | ICD-10-CM | POA: Diagnosis not present

## 2019-03-29 DIAGNOSIS — M9904 Segmental and somatic dysfunction of sacral region: Secondary | ICD-10-CM | POA: Diagnosis not present

## 2019-03-29 DIAGNOSIS — M4722 Other spondylosis with radiculopathy, cervical region: Secondary | ICD-10-CM | POA: Diagnosis not present

## 2019-04-04 DIAGNOSIS — Z23 Encounter for immunization: Secondary | ICD-10-CM | POA: Diagnosis not present

## 2019-04-16 DIAGNOSIS — M19021 Primary osteoarthritis, right elbow: Secondary | ICD-10-CM | POA: Diagnosis not present

## 2019-04-16 DIAGNOSIS — M19022 Primary osteoarthritis, left elbow: Secondary | ICD-10-CM | POA: Diagnosis not present

## 2019-05-08 DIAGNOSIS — M159 Polyosteoarthritis, unspecified: Secondary | ICD-10-CM | POA: Diagnosis not present

## 2019-05-08 DIAGNOSIS — K219 Gastro-esophageal reflux disease without esophagitis: Secondary | ICD-10-CM | POA: Diagnosis not present

## 2019-05-08 DIAGNOSIS — I10 Essential (primary) hypertension: Secondary | ICD-10-CM | POA: Diagnosis not present

## 2019-05-24 DIAGNOSIS — M5136 Other intervertebral disc degeneration, lumbar region: Secondary | ICD-10-CM | POA: Diagnosis not present

## 2019-05-29 DIAGNOSIS — M9901 Segmental and somatic dysfunction of cervical region: Secondary | ICD-10-CM | POA: Diagnosis not present

## 2019-05-29 DIAGNOSIS — M4727 Other spondylosis with radiculopathy, lumbosacral region: Secondary | ICD-10-CM | POA: Diagnosis not present

## 2019-05-29 DIAGNOSIS — M9902 Segmental and somatic dysfunction of thoracic region: Secondary | ICD-10-CM | POA: Diagnosis not present

## 2019-05-29 DIAGNOSIS — M47894 Other spondylosis, thoracic region: Secondary | ICD-10-CM | POA: Diagnosis not present

## 2019-05-29 DIAGNOSIS — M9903 Segmental and somatic dysfunction of lumbar region: Secondary | ICD-10-CM | POA: Diagnosis not present

## 2019-05-29 DIAGNOSIS — M47892 Other spondylosis, cervical region: Secondary | ICD-10-CM | POA: Diagnosis not present

## 2019-06-05 DIAGNOSIS — M19021 Primary osteoarthritis, right elbow: Secondary | ICD-10-CM | POA: Diagnosis not present

## 2019-06-05 DIAGNOSIS — M159 Polyosteoarthritis, unspecified: Secondary | ICD-10-CM | POA: Diagnosis not present

## 2019-06-05 DIAGNOSIS — M19022 Primary osteoarthritis, left elbow: Secondary | ICD-10-CM | POA: Diagnosis not present

## 2019-06-05 DIAGNOSIS — I10 Essential (primary) hypertension: Secondary | ICD-10-CM | POA: Diagnosis not present

## 2019-06-18 DIAGNOSIS — M542 Cervicalgia: Secondary | ICD-10-CM | POA: Diagnosis not present

## 2019-06-18 DIAGNOSIS — M159 Polyosteoarthritis, unspecified: Secondary | ICD-10-CM | POA: Diagnosis not present

## 2019-07-12 DIAGNOSIS — M5136 Other intervertebral disc degeneration, lumbar region: Secondary | ICD-10-CM | POA: Diagnosis not present

## 2019-07-26 DIAGNOSIS — M5412 Radiculopathy, cervical region: Secondary | ICD-10-CM | POA: Diagnosis not present

## 2019-07-26 DIAGNOSIS — M4802 Spinal stenosis, cervical region: Secondary | ICD-10-CM | POA: Diagnosis not present

## 2019-07-31 DIAGNOSIS — K5733 Diverticulitis of large intestine without perforation or abscess with bleeding: Secondary | ICD-10-CM | POA: Diagnosis not present

## 2019-07-31 DIAGNOSIS — K922 Gastrointestinal hemorrhage, unspecified: Secondary | ICD-10-CM | POA: Diagnosis not present

## 2019-08-02 DIAGNOSIS — M5136 Other intervertebral disc degeneration, lumbar region: Secondary | ICD-10-CM | POA: Diagnosis not present

## 2019-08-23 DIAGNOSIS — M4802 Spinal stenosis, cervical region: Secondary | ICD-10-CM | POA: Diagnosis not present

## 2019-09-05 DIAGNOSIS — R5381 Other malaise: Secondary | ICD-10-CM | POA: Diagnosis not present

## 2019-09-05 DIAGNOSIS — K219 Gastro-esophageal reflux disease without esophagitis: Secondary | ICD-10-CM | POA: Diagnosis not present

## 2019-09-05 DIAGNOSIS — E7800 Pure hypercholesterolemia, unspecified: Secondary | ICD-10-CM | POA: Diagnosis not present

## 2019-09-05 DIAGNOSIS — F5101 Primary insomnia: Secondary | ICD-10-CM | POA: Diagnosis not present

## 2019-09-05 DIAGNOSIS — F320 Major depressive disorder, single episode, mild: Secondary | ICD-10-CM | POA: Diagnosis not present

## 2019-09-05 DIAGNOSIS — R5383 Other fatigue: Secondary | ICD-10-CM | POA: Diagnosis not present

## 2019-09-05 DIAGNOSIS — I10 Essential (primary) hypertension: Secondary | ICD-10-CM | POA: Diagnosis not present

## 2019-09-06 ENCOUNTER — Other Ambulatory Visit: Payer: Self-pay | Admitting: Neurosurgery

## 2019-09-25 NOTE — Progress Notes (Signed)
Your procedure is scheduled on Friday September 28, 2019.  Report to [facility] Main Entrance "A" at 06:00 A.M., and check in at the Admitting office.  Call this number if you have problems the morning of surgery:  [phone]  Call [phone] if you have any questions prior to your surgery date Monday-Friday 8am-4pm   Remember: Do not eat or drink after midnight the night before your surgery  No medicines needed the morning of surgery.  Take as needed: acetaminophen (TYLENOL)  omeprazole (PRILOSEC)  SUMAtriptan (IMITREX)    As of today, STOP taking any diclofenac sodium (VOLTAREN) gel, Aspirin (unless otherwise instructed by your surgeon), Aleve, Naproxen, Ibuprofen, Motrin, Advil, Goody's, BC's, all herbal medications, fish oil, and all vitamins. (This includes your multi-vitamin, vitamin C, vitamin D3, vitamin E, and probiotic)    The Morning of Surgery  Do not wear jewelry, make-up or nail polish.  Do not wear lotions, powders, perfumes, or deodorant  Do not shave 48 hours prior to surgery.   Do not bring valuables to the hospital.  [facility] is not responsible for any belongings or valuables.  If you are a smoker, DO NOT Smoke 24 hours prior to surgery  If you wear a CPAP at night please bring your mask the morning of surgery   Remember that you must have someone to transport you home after your surgery, and remain with you for 24 hours if you are discharged the same day.   Please bring cases for contacts, glasses, hearing aids, dentures or bridgework because it cannot be worn into surgery.    Leave your suitcase in the car.  After surgery it may be brought to your room.  For patients admitted to the hospital, discharge time will be determined by your treatment team.  Patients discharged the day of surgery will not be allowed to drive home.    Special instructions:   [facility]- Preparing For Surgery  Before surgery, you can play an important role. Because  skin is not sterile, your skin needs to be as free of germs as possible. You can reduce the number of germs on your skin by washing with CHG (chlorahexidine gluconate) Soap before surgery.  CHG is an antiseptic cleaner which kills germs and bonds with the skin to continue killing germs even after washing.    Oral Hygiene is also important to reduce your risk of infection.  Remember - BRUSH YOUR TEETH THE MORNING OF SURGERY WITH YOUR REGULAR TOOTHPASTE  Please do not use if you have an allergy to CHG or antibacterial soaps. If your skin becomes reddened/irritated stop using the CHG.  Do not shave (including legs and underarms) for at least 48 hours prior to first CHG shower. It is OK to shave your face.  Please follow these instructions carefully.   1. Shower the NIGHT BEFORE SURGERY and the MORNING OF SURGERY with CHG Soap.   2. If you chose to wash your hair, wash your hair first as usual with your normal shampoo.  3. After you shampoo, rinse your hair and body thoroughly to remove the shampoo.  4. Use CHG as you would any other liquid soap. You can apply CHG directly to the skin and wash gently with a scrungie or a clean washcloth.   5. Apply the CHG Soap to your body ONLY FROM THE NECK DOWN.  Do not use on open wounds or open sores. Avoid contact with your eyes, ears, mouth and genitals (private parts). Wash  Face and genitals (private parts)  with your normal soap.   6. Wash thoroughly, paying special attention to the area where your surgery will be performed.  7. Thoroughly rinse your body with warm water from the neck down.  8. DO NOT shower/wash with your normal soap after using and rinsing off the CHG Soap.  9. Pat yourself dry with a CLEAN TOWEL.  10. Wear CLEAN PAJAMAS to bed the night before surgery.  11. Place CLEAN SHEETS on your bed the night of your first shower and DO NOT SLEEP WITH PETS. 12.  Wear comfortable clothes the morning of surgery    Day of  Surgery:  Please shower the morning of surgery with the CHG soap Do not apply any deodorants/lotions. Please wear clean clothes to the hospital/surgery center.   Remember to brush your teeth WITH YOUR REGULAR TOOTHPASTE.   Please read over the following fact sheets that you were given.

## 2019-09-26 ENCOUNTER — Other Ambulatory Visit (HOSPITAL_COMMUNITY)
Admission: RE | Admit: 2019-09-26 | Discharge: 2019-09-26 | Disposition: A | Discharge status: Home / Self Care | Payer: PPO | Source: Ambulatory Visit | Attending: Neurosurgery | Admitting: Neurosurgery

## 2019-09-26 ENCOUNTER — Encounter (HOSPITAL_COMMUNITY): Payer: Self-pay

## 2019-09-26 ENCOUNTER — Other Ambulatory Visit (HOSPITAL_COMMUNITY): Payer: Self-pay

## 2019-09-26 ENCOUNTER — Other Ambulatory Visit: Payer: Self-pay

## 2019-09-26 ENCOUNTER — Encounter (HOSPITAL_COMMUNITY)
Admission: RE | Admit: 2019-09-26 | Discharge: 2019-09-26 | Disposition: A | Discharge status: Home / Self Care | Payer: PPO | Source: Ambulatory Visit | Attending: Neurosurgery | Admitting: Neurosurgery

## 2019-09-26 DIAGNOSIS — M4802 Spinal stenosis, cervical region: Secondary | ICD-10-CM | POA: Diagnosis not present

## 2019-09-26 DIAGNOSIS — Z20822 Contact with and (suspected) exposure to covid-19: Secondary | ICD-10-CM | POA: Insufficient documentation

## 2019-09-26 DIAGNOSIS — Z01818 Encounter for other preprocedural examination: Secondary | ICD-10-CM | POA: Insufficient documentation

## 2019-09-26 DIAGNOSIS — I10 Essential (primary) hypertension: Secondary | ICD-10-CM | POA: Insufficient documentation

## 2019-09-26 HISTORY — DX: Essential (primary) hypertension: I10

## 2019-09-26 HISTORY — DX: Anxiety disorder, unspecified: F41.9

## 2019-09-26 HISTORY — DX: Personal history of other diseases of the digestive system: Z87.19

## 2019-09-26 HISTORY — DX: Tremor, unspecified: R25.1

## 2019-09-26 HISTORY — DX: Unspecified osteoarthritis, unspecified site: M19.90

## 2019-09-26 HISTORY — DX: Headache, unspecified: R51.9

## 2019-09-26 HISTORY — DX: Myoneural disorder, unspecified: G70.9

## 2019-09-26 HISTORY — DX: Gastro-esophageal reflux disease without esophagitis: K21.9

## 2019-09-26 LAB — BASIC METABOLIC PANEL
Anion gap: 10 (ref 5–15)
BUN: 12 mg/dL (ref 8–23)
CO2: 27 mmol/L (ref 22–32)
Calcium: 9.7 mg/dL (ref 8.9–10.3)
Chloride: 101 mmol/L (ref 98–111)
Creatinine, Ser: 0.69 mg/dL (ref 0.44–1.00)
GFR calc Af Amer: >60 mL/min (ref >60)
GFR calc non Af Amer: >60 mL/min (ref >60)
Glucose, Bld: 103 mg/dL — ABNORMAL HIGH (ref 70–99)
Potassium: 4.4 mmol/L (ref 3.5–5.1)
Sodium: 138 mmol/L (ref 135–145)

## 2019-09-26 LAB — CBC WITH DIFFERENTIAL/PLATELET
Abs Immature Granulocytes: 0.02 10*3/uL (ref 0.00–0.07)
Basophils Absolute: 0.1 10*3/uL (ref 0.0–0.1)
Basophils Relative: 1 %
Eosinophils Absolute: 0.3 10*3/uL (ref 0.0–0.5)
Eosinophils Relative: 3 %
HCT: 44.2 % (ref 36.0–46.0)
Hemoglobin: 14.1 g/dL (ref 12.0–15.0)
Immature Granulocytes: 0 %
Lymphocytes Relative: 29 %
Lymphs Abs: 2.9 10*3/uL (ref 0.7–4.0)
MCH: 30.3 pg (ref 26.0–34.0)
MCHC: 31.9 g/dL (ref 30.0–36.0)
MCV: 95.1 fL (ref 80.0–100.0)
Monocytes Absolute: 1.3 10*3/uL — ABNORMAL HIGH (ref 0.1–1.0)
Monocytes Relative: 13 %
Neutro Abs: 5.5 10*3/uL (ref 1.7–7.7)
Neutrophils Relative %: 54 %
Platelets: 444 10*3/uL — ABNORMAL HIGH (ref 150–400)
RBC: 4.65 MIL/uL (ref 3.87–5.11)
RDW: 12.6 % (ref 11.5–15.5)
WBC: 10.2 10*3/uL (ref 4.0–10.5)
nRBC: 0 % (ref 0.0–0.2)

## 2019-09-26 LAB — SARS CORONAVIRUS 2 (TAT 6-24 HRS): SARS Coronavirus 2: NEGATIVE

## 2019-09-26 LAB — SURGICAL PCR SCREEN
MRSA, PCR: NEGATIVE
Staphylococcus aureus: NEGATIVE

## 2019-09-26 NOTE — Progress Notes (Addendum)
PCP - Gilford Rile, MD Cardiologist - Denies  PPM/ICD - Denies  Chest x-ray - N/A EKG - 09/26/19 (*Note: patient has muscle tremors; slight artifact noted)  Stress Test - Patient unsure, thinks she may have had one done at [facility]. Records Requested.  ECHO - Denies Cardiac Cath - Denies  Sleep Study - Denies  Patient denies being a diabetic.  Blood Thinner Instructions: N/A Aspirin Instructions: N/A  ERAS Protcol - N/A PRE-SURGERY Ensure or G2- N/A  COVID TEST- 09/26/19   Anesthesia review: Yes, Stress test records requested; needs review, if available.  Patient denies shortness of breath, fever, cough and chest pain at PAT appointment   All instructions explained to the patient, with a verbal understanding of the material. Patient agrees to go over the instructions while at home for a better understanding. Patient also instructed to self quarantine after being tested for COVID-19. The opportunity to ask questions was provided.

## 2019-09-27 NOTE — Anesthesia Preprocedure Evaluation (Addendum)
Anesthesia Evaluation  Patient identified by MRN, date of birth, ID band Patient awake    Reviewed: Allergy & Precautions, NPO status , Patient's Chart, lab work & pertinent test results  Airway Mallampati: III  TM Distance: >3 FB Neck ROM: Limited    Dental  (+) Edentulous Upper, Edentulous Lower   Pulmonary former smoker,    Pulmonary exam normal breath sounds clear to auscultation       Cardiovascular hypertension, Pt. on medications Normal cardiovascular exam Rhythm:Regular Rate:Normal  ECG: NSR, rate 64   Neuro/Psych  Headaches, Anxiety    GI/Hepatic Neg liver ROS, hiatal hernia, GERD  Medicated and Controlled,  Endo/Other  negative endocrine ROS  Renal/GU negative Renal ROS     Musculoskeletal negative musculoskeletal ROS (+)   Abdominal   Peds  Hematology negative hematology ROS (+)   Anesthesia Other Findings Stenosis  Reproductive/Obstetrics                           Anesthesia Physical Anesthesia Plan  ASA: II  Anesthesia Plan: General   Post-op Pain Management:    Induction: Intravenous  PONV Risk Score and Plan: 3 and Midazolam, Dexamethasone, Ondansetron and Treatment may vary due to age or medical condition  Airway Management Planned: Oral ETT and Video Laryngoscope Planned  Additional Equipment:   Intra-op Plan:   Post-operative Plan: Extubation in OR  Informed Consent: I have reviewed the patients History and Physical, chart, labs and discussed the procedure including the risks, benefits and alternatives for the proposed anesthesia with the patient or authorized representative who has indicated his/her understanding and acceptance.       Plan Discussed with: CRNA  Anesthesia Plan Comments: (Nuclear stress 04/22/2014 (copy on chart): Summary and conclusions: 1.  No ischemia seen on the scan. 2.  Normal gated imaging. 3.  Normal left ventricle ejection  fraction calculated to be of 79%. Comments: Fixed defect involving inferior lateral wall is most likely related to diaphragmatic attenuation.)      Anesthesia Quick Evaluation

## 2019-09-28 ENCOUNTER — Encounter (HOSPITAL_COMMUNITY): Payer: Self-pay | Admitting: Neurosurgery

## 2019-09-28 ENCOUNTER — Encounter (HOSPITAL_COMMUNITY)
Admission: RE | Disposition: A | Discharge status: Home / Self Care | Payer: Self-pay | Source: Ambulatory Visit | Attending: Neurosurgery

## 2019-09-28 ENCOUNTER — Other Ambulatory Visit: Payer: Self-pay

## 2019-09-28 ENCOUNTER — Ambulatory Visit (HOSPITAL_COMMUNITY): Payer: PPO

## 2019-09-28 ENCOUNTER — Observation Stay (HOSPITAL_COMMUNITY)
Admission: RE | Admit: 2019-09-28 | Discharge: 2019-09-29 | Disposition: A | Discharge status: Home / Self Care | Payer: PPO | Source: Ambulatory Visit | Attending: Neurosurgery | Admitting: Neurosurgery

## 2019-09-28 ENCOUNTER — Ambulatory Visit (HOSPITAL_COMMUNITY): Payer: PPO | Admitting: Physician Assistant

## 2019-09-28 ENCOUNTER — Ambulatory Visit (HOSPITAL_COMMUNITY): Payer: PPO | Admitting: Certified Registered"

## 2019-09-28 DIAGNOSIS — Z886 Allergy status to analgesic agent status: Secondary | ICD-10-CM | POA: Diagnosis not present

## 2019-09-28 DIAGNOSIS — Z419 Encounter for procedure for purposes other than remedying health state, unspecified: Secondary | ICD-10-CM

## 2019-09-28 DIAGNOSIS — I10 Essential (primary) hypertension: Secondary | ICD-10-CM | POA: Diagnosis not present

## 2019-09-28 DIAGNOSIS — Z885 Allergy status to narcotic agent status: Secondary | ICD-10-CM | POA: Diagnosis not present

## 2019-09-28 DIAGNOSIS — Z888 Allergy status to other drugs, medicaments and biological substances status: Secondary | ICD-10-CM | POA: Insufficient documentation

## 2019-09-28 DIAGNOSIS — Z881 Allergy status to other antibiotic agents status: Secondary | ICD-10-CM | POA: Diagnosis not present

## 2019-09-28 DIAGNOSIS — M4712 Other spondylosis with myelopathy, cervical region: Principal | ICD-10-CM | POA: Diagnosis present

## 2019-09-28 DIAGNOSIS — M1990 Unspecified osteoarthritis, unspecified site: Secondary | ICD-10-CM | POA: Diagnosis not present

## 2019-09-28 DIAGNOSIS — F419 Anxiety disorder, unspecified: Secondary | ICD-10-CM | POA: Diagnosis not present

## 2019-09-28 DIAGNOSIS — M4722 Other spondylosis with radiculopathy, cervical region: Secondary | ICD-10-CM | POA: Insufficient documentation

## 2019-09-28 DIAGNOSIS — Z882 Allergy status to sulfonamides status: Secondary | ICD-10-CM | POA: Insufficient documentation

## 2019-09-28 DIAGNOSIS — Z87891 Personal history of nicotine dependence: Secondary | ICD-10-CM | POA: Diagnosis not present

## 2019-09-28 DIAGNOSIS — Z79899 Other long term (current) drug therapy: Secondary | ICD-10-CM | POA: Insufficient documentation

## 2019-09-28 DIAGNOSIS — M4322 Fusion of spine, cervical region: Secondary | ICD-10-CM | POA: Diagnosis not present

## 2019-09-28 DIAGNOSIS — Z981 Arthrodesis status: Secondary | ICD-10-CM | POA: Diagnosis not present

## 2019-09-28 DIAGNOSIS — M4802 Spinal stenosis, cervical region: Secondary | ICD-10-CM | POA: Diagnosis not present

## 2019-09-28 HISTORY — PX: ANTERIOR CERVICAL DECOMP/DISCECTOMY FUSION: SHX1161

## 2019-09-28 SURGERY — ANTERIOR CERVICAL DECOMPRESSION/DISCECTOMY FUSION 2 LEVELS
Anesthesia: General

## 2019-09-28 MED ORDER — SODIUM CHLORIDE 0.9% FLUSH: 3.0000 mL | INTRAVENOUS | Status: DC | PRN

## 2019-09-28 MED ORDER — 0.9 % SODIUM CHLORIDE (POUR BTL) OPTIME
TOPICAL | Status: DC | PRN
  Administered 2019-09-28: 1000 mL

## 2019-09-28 MED ORDER — CEFAZOLIN SODIUM-DEXTROSE 2-4 GM/100ML-% IV SOLN
2.0000 g | INTRAVENOUS | Status: AC
  Administered 2019-09-28: 08:00:00 2 g via INTRAVENOUS
  Filled 2019-09-28: qty 100

## 2019-09-28 MED ORDER — PHENYLEPHRINE 40 MCG/ML (10ML) SYRINGE FOR IV PUSH (FOR BLOOD PRESSURE SUPPORT)
PREFILLED_SYRINGE | INTRAVENOUS | Status: DC | PRN
  Administered 2019-09-28: 160 ug via INTRAVENOUS

## 2019-09-28 MED ORDER — DEXAMETHASONE SODIUM PHOSPHATE 10 MG/ML IJ SOLN
10.0000 mg | Freq: Once | INTRAMUSCULAR | Status: DC
  Filled 2019-09-28: qty 1

## 2019-09-28 MED ORDER — PHENYLEPHRINE HCL-NACL 10-0.9 MG/250ML-% IV SOLN
INTRAVENOUS | Status: DC | PRN
  Administered 2019-09-28: 25 ug/min via INTRAVENOUS

## 2019-09-28 MED ORDER — ADULT MULTIVITAMIN W/MINERALS CH: 1.0000 | ORAL_TABLET | Freq: Every day | ORAL | Status: DC

## 2019-09-28 MED ORDER — FENTANYL CITRATE (PF) 100 MCG/2ML IJ SOLN
INTRAMUSCULAR | Status: DC | PRN
  Administered 2019-09-28 (×2): 50 ug via INTRAVENOUS

## 2019-09-28 MED ORDER — LACTATED RINGERS IV SOLN: INTRAVENOUS | Status: DC

## 2019-09-28 MED ORDER — MIDAZOLAM HCL 2 MG/2ML IJ SOLN
INTRAMUSCULAR | Status: DC | PRN
  Administered 2019-09-28: 1 mg via INTRAVENOUS

## 2019-09-28 MED ORDER — POLYETHYL GLYCOL-PROPYL GLYCOL 0.4-0.3 % OP GEL: Freq: Once | OPHTHALMIC | Status: DC

## 2019-09-28 MED ORDER — DEXAMETHASONE SODIUM PHOSPHATE 10 MG/ML IJ SOLN
INTRAMUSCULAR | Status: AC
  Filled 2019-09-28: qty 1

## 2019-09-28 MED ORDER — HYDROCODONE-ACETAMINOPHEN 5-325 MG PO TABS: 1.0000 | ORAL_TABLET | ORAL | Status: DC | PRN

## 2019-09-28 MED ORDER — CYCLOBENZAPRINE HCL 10 MG PO TABS
10.0000 mg | ORAL_TABLET | Freq: Three times a day (TID) | ORAL | Status: DC | PRN
  Administered 2019-09-28: 10 mg via ORAL
  Filled 2019-09-28: qty 1

## 2019-09-28 MED ORDER — PHENYLEPHRINE-ACETAMINOPHEN 5-325 MG PO TABS: 1.0000 | ORAL_TABLET | Freq: Every day | ORAL | Status: DC | PRN

## 2019-09-28 MED ORDER — FENTANYL CITRATE (PF) 100 MCG/2ML IJ SOLN
25.0000 ug | INTRAMUSCULAR | Status: DC | PRN
  Administered 2019-09-28: 25 ug via INTRAVENOUS

## 2019-09-28 MED ORDER — HYDROCODONE-ACETAMINOPHEN 10-325 MG PO TABS
2.0000 | ORAL_TABLET | ORAL | Status: DC | PRN
  Administered 2019-09-28: 2 via ORAL
  Filled 2019-09-28: qty 2

## 2019-09-28 MED ORDER — ROCURONIUM BROMIDE 10 MG/ML (PF) SYRINGE
PREFILLED_SYRINGE | INTRAVENOUS | Status: AC
  Filled 2019-09-28: qty 10

## 2019-09-28 MED ORDER — SUMATRIPTAN SUCCINATE 50 MG PO TABS
50.0000 mg | ORAL_TABLET | ORAL | Status: DC | PRN
  Filled 2019-09-28: qty 1

## 2019-09-28 MED ORDER — THROMBIN 5000 UNITS EX SOLR
CUTANEOUS | Status: AC
  Filled 2019-09-28: qty 15000

## 2019-09-28 MED ORDER — HYDROCODONE-ACETAMINOPHEN 5-325 MG PO TABS: 1.0000 | ORAL_TABLET | ORAL | 0 refills | Status: DC | PRN

## 2019-09-28 MED ORDER — SUGAMMADEX SODIUM 200 MG/2ML IV SOLN
INTRAVENOUS | Status: DC | PRN
  Administered 2019-09-28: 140 mg via INTRAVENOUS

## 2019-09-28 MED ORDER — SALINE SPRAY 0.65 % NA SOLN
1.0000 | NASAL | Status: DC | PRN
  Filled 2019-09-28: qty 44

## 2019-09-28 MED ORDER — SODIUM CHLORIDE 0.9 % IV SOLN
INTRAVENOUS | Status: DC | PRN
  Administered 2019-09-28: 500 mL

## 2019-09-28 MED ORDER — FENTANYL CITRATE (PF) 250 MCG/5ML IJ SOLN
INTRAMUSCULAR | Status: AC
  Filled 2019-09-28: qty 5

## 2019-09-28 MED ORDER — HYDROMORPHONE HCL 1 MG/ML IJ SOLN: 1.0000 mg | INTRAMUSCULAR | Status: DC | PRN

## 2019-09-28 MED ORDER — THROMBIN 5000 UNITS EX SOLR: OROMUCOSAL | Status: DC | PRN

## 2019-09-28 MED ORDER — LIDOCAINE 2% (20 MG/ML) 5 ML SYRINGE
INTRAMUSCULAR | Status: AC
  Filled 2019-09-28: qty 5

## 2019-09-28 MED ORDER — CYCLOBENZAPRINE HCL 10 MG PO TABS: 10.0000 mg | ORAL_TABLET | Freq: Three times a day (TID) | ORAL | 0 refills | Status: DC | PRN

## 2019-09-28 MED ORDER — RISAQUAD PO CAPS
1.0000 | ORAL_CAPSULE | Freq: Every day | ORAL | Status: DC
  Filled 2019-09-28: qty 1

## 2019-09-28 MED ORDER — SODIUM CHLORIDE 0.9 % IV SOLN: 250.0000 mL | INTRAVENOUS | Status: DC

## 2019-09-28 MED ORDER — ASCORBIC ACID 500 MG PO TABS: 500.0000 mg | ORAL_TABLET | Freq: Every day | ORAL | Status: DC

## 2019-09-28 MED ORDER — ONDANSETRON HCL 4 MG PO TABS: 4.0000 mg | ORAL_TABLET | Freq: Four times a day (QID) | ORAL | Status: DC | PRN

## 2019-09-28 MED ORDER — THROMBIN 5000 UNITS EX SOLR
CUTANEOUS | Status: DC | PRN
  Administered 2019-09-28: 10000 [IU] via TOPICAL

## 2019-09-28 MED ORDER — FENTANYL CITRATE (PF) 100 MCG/2ML IJ SOLN
INTRAMUSCULAR | Status: AC
  Filled 2019-09-28: qty 2

## 2019-09-28 MED ORDER — PANTOPRAZOLE SODIUM 40 MG PO TBEC: 40.0000 mg | DELAYED_RELEASE_TABLET | Freq: Every day | ORAL | Status: DC

## 2019-09-28 MED ORDER — VITAMIN E 180 MG (400 UNIT) PO CAPS: 400.0000 [IU] | ORAL_CAPSULE | ORAL | Status: DC

## 2019-09-28 MED ORDER — ACETAMINOPHEN 650 MG RE SUPP: 650.0000 mg | RECTAL | Status: DC | PRN

## 2019-09-28 MED ORDER — CHLORHEXIDINE GLUCONATE CLOTH 2 % EX PADS: 6.0000 | MEDICATED_PAD | Freq: Once | CUTANEOUS | Status: DC

## 2019-09-28 MED ORDER — MENTHOL 3 MG MT LOZG: 1.0000 | LOZENGE | OROMUCOSAL | Status: DC | PRN

## 2019-09-28 MED ORDER — HEMOSTATIC AGENTS (NO CHARGE) OPTIME
TOPICAL | Status: DC | PRN
  Administered 2019-09-28: 1 via TOPICAL

## 2019-09-28 MED ORDER — SODIUM CHLORIDE 0.9% FLUSH
3.0000 mL | Freq: Two times a day (BID) | INTRAVENOUS | Status: DC
  Administered 2019-09-28: 3 mL via INTRAVENOUS

## 2019-09-28 MED ORDER — LIDOCAINE 2% (20 MG/ML) 5 ML SYRINGE
INTRAMUSCULAR | Status: DC | PRN
  Administered 2019-09-28: 60 mg via INTRAVENOUS

## 2019-09-28 MED ORDER — THROMBIN 5000 UNITS EX SOLR
CUTANEOUS | Status: AC
  Filled 2019-09-28: qty 5000

## 2019-09-28 MED ORDER — ALPRAZOLAM 0.25 MG PO TABS
0.1250 mg | ORAL_TABLET | Freq: Every evening | ORAL | Status: DC | PRN
  Administered 2019-09-28: 0.125 mg via ORAL
  Filled 2019-09-28: qty 1

## 2019-09-28 MED ORDER — HYDROXYZINE HCL 50 MG/ML IM SOLN
50.0000 mg | Freq: Four times a day (QID) | INTRAMUSCULAR | Status: DC | PRN
  Administered 2019-09-28: 50 mg via INTRAMUSCULAR
  Filled 2019-09-28: qty 1

## 2019-09-28 MED ORDER — MIDAZOLAM HCL 2 MG/2ML IJ SOLN
INTRAMUSCULAR | Status: AC
  Filled 2019-09-28: qty 2

## 2019-09-28 MED ORDER — PROPOFOL 10 MG/ML IV BOLUS
INTRAVENOUS | Status: AC
  Filled 2019-09-28: qty 20

## 2019-09-28 MED ORDER — DEXAMETHASONE SODIUM PHOSPHATE 10 MG/ML IJ SOLN
INTRAMUSCULAR | Status: DC | PRN
  Administered 2019-09-28: 10 mg via INTRAVENOUS

## 2019-09-28 MED ORDER — ONDANSETRON HCL 4 MG/2ML IJ SOLN
INTRAMUSCULAR | Status: DC | PRN
  Administered 2019-09-28: 4 mg via INTRAVENOUS

## 2019-09-28 MED ORDER — VITAMIN D 25 MCG (1000 UNIT) PO TABS: 1000.0000 [IU] | ORAL_TABLET | Freq: Every day | ORAL | Status: DC

## 2019-09-28 MED ORDER — ONDANSETRON HCL 4 MG/2ML IJ SOLN: 4.0000 mg | Freq: Four times a day (QID) | INTRAMUSCULAR | Status: DC | PRN

## 2019-09-28 MED ORDER — ACETAMINOPHEN 325 MG PO TABS
650.0000 mg | ORAL_TABLET | ORAL | Status: DC | PRN
  Administered 2019-09-28 – 2019-09-29 (×3): 650 mg via ORAL
  Filled 2019-09-28 (×3): qty 2

## 2019-09-28 MED ORDER — ACETAMINOPHEN 500 MG PO TABS
1000.0000 mg | ORAL_TABLET | Freq: Once | ORAL | Status: AC
  Administered 2019-09-28: 1000 mg via ORAL
  Filled 2019-09-28: qty 2

## 2019-09-28 MED ORDER — ONDANSETRON HCL 4 MG/2ML IJ SOLN
INTRAMUSCULAR | Status: AC
  Filled 2019-09-28: qty 2

## 2019-09-28 MED ORDER — PHENOL 1.4 % MT LIQD: 1.0000 | OROMUCOSAL | Status: DC | PRN

## 2019-09-28 MED ORDER — ROCURONIUM BROMIDE 10 MG/ML (PF) SYRINGE
PREFILLED_SYRINGE | INTRAVENOUS | Status: DC | PRN
  Administered 2019-09-28: 20 mg via INTRAVENOUS
  Administered 2019-09-28: 40 mg via INTRAVENOUS
  Administered 2019-09-28 (×2): 20 mg via INTRAVENOUS

## 2019-09-28 MED ORDER — CEFAZOLIN SODIUM-DEXTROSE 1-4 GM/50ML-% IV SOLN
1.0000 g | Freq: Three times a day (TID) | INTRAVENOUS | Status: AC
  Administered 2019-09-28 (×2): 1 g via INTRAVENOUS
  Filled 2019-09-28 (×2): qty 50

## 2019-09-28 MED ORDER — RAMIPRIL 5 MG PO CAPS
5.0000 mg | ORAL_CAPSULE | Freq: Two times a day (BID) | ORAL | Status: DC
  Administered 2019-09-28 (×2): 5 mg via ORAL
  Filled 2019-09-28 (×2): qty 1

## 2019-09-28 MED ORDER — PROPOFOL 10 MG/ML IV BOLUS
INTRAVENOUS | Status: DC | PRN
  Administered 2019-09-28: 150 mg via INTRAVENOUS

## 2019-09-28 MED ORDER — ONDANSETRON HCL 4 MG/2ML IJ SOLN: 4.0000 mg | Freq: Once | INTRAMUSCULAR | Status: DC | PRN

## 2019-09-28 SURGICAL SUPPLY — 57 items
BAG DECANTER FOR FLEXI CONT (MISCELLANEOUS) ×3 IMPLANT
BENZOIN TINCTURE PRP APPL 2/3 (GAUZE/BANDAGES/DRESSINGS) ×3 IMPLANT
BIT DRILL 13 (BIT) ×1 IMPLANT
BIT DRILL 13MM (BIT) ×1
BUR MATCHSTICK NEURO 3.0 LAGG (BURR) ×3 IMPLANT
CAGE PEEK 7X14X11 (Cage) ×3 IMPLANT
CANISTER SUCT 3000ML PPV (MISCELLANEOUS) ×3 IMPLANT
CARTRIDGE OIL MAESTRO DRILL (MISCELLANEOUS) ×1 IMPLANT
CLOSURE WOUND 1/2 X4 (GAUZE/BANDAGES/DRESSINGS) ×1
COVER WAND RF STERILE (DRAPES) ×3 IMPLANT
DIFFUSER DRILL AIR PNEUMATIC (MISCELLANEOUS) ×3 IMPLANT
DRAPE C-ARM 42X72 X-RAY (DRAPES) ×6 IMPLANT
DRAPE LAPAROTOMY 100X72 PEDS (DRAPES) ×3 IMPLANT
DRAPE MICROSCOPE LEICA (MISCELLANEOUS) ×3 IMPLANT
DURAPREP 6ML APPLICATOR 50/CS (WOUND CARE) ×3 IMPLANT
ELECT COATED BLADE 2.86 ST (ELECTRODE) ×3 IMPLANT
ELECT REM PT RETURN 9FT ADLT (ELECTROSURGICAL) ×3
ELECTRODE REM PT RTRN 9FT ADLT (ELECTROSURGICAL) ×1 IMPLANT
GAUZE 4X4 16PLY RFD (DISPOSABLE) IMPLANT
GAUZE SPONGE 4X4 12PLY STRL (GAUZE/BANDAGES/DRESSINGS) ×3 IMPLANT
GAUZE SPONGE 4X4 12PLY STRL LF (GAUZE/BANDAGES/DRESSINGS) ×2 IMPLANT
GLOVE ECLIPSE 9.0 STRL (GLOVE) ×3 IMPLANT
GLOVE EXAM NITRILE XL STR (GLOVE) IMPLANT
GOWN STRL REUS W/ TWL LRG LVL3 (GOWN DISPOSABLE) IMPLANT
GOWN STRL REUS W/ TWL XL LVL3 (GOWN DISPOSABLE) ×1 IMPLANT
GOWN STRL REUS W/TWL 2XL LVL3 (GOWN DISPOSABLE) IMPLANT
GOWN STRL REUS W/TWL LRG LVL3 (GOWN DISPOSABLE)
GOWN STRL REUS W/TWL XL LVL3 (GOWN DISPOSABLE) ×3
HALTER HD/CHIN CERV TRACTION D (MISCELLANEOUS) ×3 IMPLANT
HEMOSTAT POWDER KIT SURGIFOAM (HEMOSTASIS) ×2 IMPLANT
HEMOSTAT SURGICEL 2X14 (HEMOSTASIS) IMPLANT
KIT BASIN OR (CUSTOM PROCEDURE TRAY) ×3 IMPLANT
KIT TURNOVER KIT B (KITS) ×3 IMPLANT
NDL SPNL 20GX3.5 QUINCKE YW (NEEDLE) ×1 IMPLANT
NEEDLE SPNL 20GX3.5 QUINCKE YW (NEEDLE) ×3 IMPLANT
NS IRRIG 1000ML POUR BTL (IV SOLUTION) ×3 IMPLANT
OIL CARTRIDGE MAESTRO DRILL (MISCELLANEOUS) ×3
PACK LAMINECTOMY NEURO (CUSTOM PROCEDURE TRAY) ×3 IMPLANT
PAD ARMBOARD 7.5X6 YLW CONV (MISCELLANEOUS) ×9 IMPLANT
PEEK CAGE 7X14X11 (Cage) ×2 IMPLANT
PLATE ELITE 42MM (Plate) ×2 IMPLANT
RUBBERBAND STERILE (MISCELLANEOUS) ×6 IMPLANT
SCREW ST 13X4XST VA NS SPNE (Screw) IMPLANT
SCREW ST VAR 4 ATL (Screw) ×18 IMPLANT
SPACER SPNL 11X14X7XPEEK CVD (Cage) IMPLANT
SPCR SPNL 11X14X7XPEEK CVD (Cage) ×1 IMPLANT
SPONGE INTESTINAL PEANUT (DISPOSABLE) ×3 IMPLANT
SPONGE SURGIFOAM ABS GEL SZ50 (HEMOSTASIS) ×3 IMPLANT
STRIP CLOSURE SKIN 1/2X4 (GAUZE/BANDAGES/DRESSINGS) ×2 IMPLANT
SUT VIC AB 3-0 SH 8-18 (SUTURE) ×3 IMPLANT
SUT VIC AB 4-0 RB1 18 (SUTURE) ×3 IMPLANT
TAPE CLOTH 4X10 WHT NS (GAUZE/BANDAGES/DRESSINGS) ×3 IMPLANT
TAPE CLOTH SURG 6X10 WHT LF (GAUZE/BANDAGES/DRESSINGS) ×2 IMPLANT
TOWEL GREEN STERILE (TOWEL DISPOSABLE) ×3 IMPLANT
TOWEL GREEN STERILE FF (TOWEL DISPOSABLE) ×3 IMPLANT
TRAP SPECIMEN MUCOUS 40CC (MISCELLANEOUS) ×3 IMPLANT
WATER STERILE IRR 1000ML POUR (IV SOLUTION) ×3 IMPLANT

## 2019-09-28 NOTE — Transfer of Care (Signed)
Immediate Anesthesia Transfer of Care Note  Patient: Mckenzie Guerrero  Procedure(s) Performed: Anterior Cervical Discectomy Fusion - Cervical Four-Cervical Five- Cervical Five-Cervical Six (N/A )  Patient Location: PACU  Anesthesia Type:General  Level of Consciousness: awake  Airway & Oxygen Therapy: Patient Spontanous Breathing  Post-op Assessment: Report given to RN and Patient moving all extremities X 4  Post vital signs: Reviewed and stable  Last Vitals:  Vitals Value Taken Time  BP 137/71 09/28/19 1015  Temp    Pulse 80 09/28/19 1015  Resp 14 09/28/19 1015  SpO2 91 % 09/28/19 1015  Vitals shown include unvalidated device data.  Last Pain: There were no vitals filed for this visit.       Complications: No apparent anesthesia complications

## 2019-09-28 NOTE — Op Note (Signed)
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  Asst. Surgeon: Reinaldo Meeker, NP  Anesthesia: General  Indication: 69 year old female with progressive neck and bilateral upper extremity pain paresthesias and weakness consistent with a myelo radiculopathy involving both upper extremities.  Work-up demonstrates evidence of marked disc degeneration with associated spondylosis and moderately severe central stenosis and severe foraminal stenosis bilaterally at C4-5 and C5-6.  Patient presents now for two-level anterior cervical decompression and fusion in hopes of improving her symptoms.  Operative note: After induction anesthesia, patient positioned supine with neck slightly extended and held in place with halter traction.  Patient's anterior cervical region prepped draped sterilely.  Incision made overlying C5.  Dissection performed on the right.  Retractor placed.  Fluoroscopy used.  Levels confirmed.  The spaces incised at both levels.  Discectomy was performed using various instruments down to level the posterior annulus.  Microscope then brought to the field and used throughout the remainder of the annulus.  Remaining aspects annulus and osteophytes removed using high-speed drill down to level the posterior longitudinal ligament.  Posterior logical is not elevated and resected in piecemeal fashion.  Underlying thecal sac was identified.  Wide central decompression was then performed undercutting the bodies of C4 and C5.  Decompression then proceeded into each neural foramina.  Wide anterior foraminotomies were performed along course exiting C5 nerve roots bilaterally.  At this  point a very thorough decompression had been achieved.  There was no evidence of injury to the thecal sac or nerve roots.  Wound is then irrigated one final time.  Gelfoam was placed topically and hemostasis was assured.  Procedures then repeated at C5-6 again without complications.  Gelfoam was removed.  7 mm Medtronic anatomic peek cages were packed with locally harvested autograft and impacted in the place at both levels.  Each cage was recessed slightly from the anterior cortical margin.  Medtronic anterior cervical plate was then placed over the C4, C5 and C6 levels.  The plate was then attached under fluoroscopic guidance using 13 mm variable angle screws to each at all 3 levels.  All 6 screws given final tightening found to be solid within the bone.  Locking screws engaged at all levels.  Final images reveal good position of the cages and the hardware at the proper upper level with normal alignment of the spine.  Wound is then irrigated one final time.  Hemostasis was assured.  Wound is closed in layers with Vicryl sutures.  Steri-Strips and sterile dressing were applied.  No apparent complications.  Patient tolerated the procedure well and she returned to the recovery room postop.

## 2019-09-28 NOTE — Progress Notes (Signed)
Orthopedic Tech Progress Note Patient Details:  Mckenzie Guerrero 03/16/1951 [id number]  Ortho Devices Type of Ortho Device: Soft collar Ortho Device/Splint Interventions: Application   Post Interventions Patient Tolerated: Well Instructions Provided: Care of device   [place] Pink 09/28/2019, 12:33 PM

## 2019-09-28 NOTE — Discharge Summary (Signed)
Physician Discharge Summary  Patient ID: Mckenzie Guerrero MRN: [id number] DOB/AGE: [birth year] 69 y.o.  Admit date: 09/28/2019 Discharge date: 09/28/2019  Admission Diagnoses:  Discharge Diagnoses:  Active Problems:   Cervical spondylosis with myelopathy and radiculopathy   Discharged Condition: good  Hospital Course: Patient mated to the hospital where she underwent uncomplicated two-level anterior cervical decompression and fusion.  Postop Mckenzie Guerrero doing well.  Some posterior neck pain but no upper extremity pain.  Strength and sensation improved.  Standing and walking well.  Patient desires discharge home tonight.  Consults:   Significant Diagnostic Studies:   Treatments:   Discharge Exam: Blood pressure 121/65, pulse 71, temperature 97.7 F (36.5 C), temperature source Oral, resp. rate 18, height 5\' 5"  (1.651 m), weight 70.8 kg, SpO2 95 %. Awake and alert.  Oriented and appropriate.  Motor and sensory function intact.  Wound clean and dry.  Neck soft.  Chest and abdomen benign  Disposition: Discharge disposition: 01-Home or Self Care        Allergies as of 09/28/2019      Reactions   Codeine Hives, Itching   Doxycycline Nausea And Vomiting   Nsaids Other (See Comments)   Gi bleeding   Sulfonamide Derivatives Swelling   Prednisone Anxiety      Medication List    TAKE these medications   acetaminophen 500 MG tablet Commonly known as: TYLENOL Take 500-1,000 mg by mouth 2 (two) times daily as needed for moderate pain.   ALPRAZolam 0.25 MG tablet Commonly known as: XANAX Take 0.125-0.25 mg by mouth at bedtime as needed for anxiety or sleep.   ascorbic acid 500 MG tablet Commonly known as: VITAMIN C Take 500 mg by mouth daily.   cholecalciferol 25 MCG (1000 UNIT) tablet Commonly known as: VITAMIN D3 Take 1,000 Units by mouth daily.   cyclobenzaprine 10 MG tablet Commonly known as: FLEXERIL Take 1 tablet (10 mg total) by mouth 3 (three) times daily as needed for  muscle spasms.   diclofenac sodium 1 % Gel Commonly known as: Voltaren Apply 2 g topically 4 (four) times daily. To painful areas.   HYDROcodone-acetaminophen 5-325 MG tablet Commonly known as: NORCO/VICODIN Take 1-2 tablets by mouth every 4 (four) hours as needed for moderate pain ((score 4 to 6)).   multivitamin with minerals Tabs tablet Take 1 tablet by mouth daily.   omeprazole 20 MG capsule Commonly known as: PRILOSEC Take 20 mg by mouth See admin instructions. Take 20 mg daily, may take a second 20 mg dose as needed for acid reflux   Probiotic Caps Take 1 capsule by mouth daily.   ramipril 5 MG capsule Commonly known as: ALTACE Take 5 mg by mouth 2 (two) times daily.   Sinus Pressure + Pain 5-325 MG Tabs Generic drug: Phenylephrine-Acetaminophen Take 1-2 tablets by mouth daily as needed (congestion).   sodium chloride 0.65 % Soln nasal spray Commonly known as: OCEAN Place 1 spray into both nostrils as needed for congestion.   SUMAtriptan 50 MG tablet Commonly known as: IMITREX Take 50 mg by mouth every 2 (two) hours as needed for migraine.   SYSTANE OP Place 1 drop into both eyes in the morning and at bedtime.   vitamin E 180 MG (400 UNITS) capsule Take 400 Units by mouth 3 (three) times a week.        Signed: Cooper Render Jenissa Tyrell 09/28/2019, 3:15 PM

## 2019-09-28 NOTE — Brief Op Note (Signed)
09/28/2019  10:01 AM  PATIENT:  Mckenzie Guerrero  69 y.o. female  PRE-OPERATIVE DIAGNOSIS:  Stenosis  POST-OPERATIVE DIAGNOSIS:  Stenosis  PROCEDURE:  Procedure(s) with comments: Anterior Cervical Discectomy Fusion - Cervical Four-Cervical Five- Cervical Five-Cervical Six (N/A) - Anterior Cervical Discectomy Fusion - Cervical Four-Cervical Five- Cervical Five-Cervical Six  SURGEON:  Surgeon(s) and Role:    Earnie Larsson, MD - Primary  PHYSICIAN ASSISTANT:   ASSISTANTS: Bergman,np   ANESTHESIA:   general  EBL:  150 mL   BLOOD ADMINISTERED:none  DRAINS: none   LOCAL MEDICATIONS USED:  MARCAINE     SPECIMEN:  No Specimen  DISPOSITION OF SPECIMEN:  N/A  COUNTS:  YES  TOURNIQUET:  * No tourniquets in log *  DICTATION: .Dragon Dictation  PLAN OF CARE: Admit for overnight observation  PATIENT DISPOSITION:  PACU - hemodynamically stable.   Delay start of Pharmacological VTE agent (>24hrs) due to surgical blood loss or risk of bleeding: yes

## 2019-09-28 NOTE — H&P (Signed)
Mckenzie Guerrero is an 69 y.o. female.   Chief Complaint: Neck pain HPI: 69 year old female with neck pain with radiation to both upper extremities.  Patient with associated numbness and some weakness.  Work-up demonstrates evidence of significant disc degeneration with disc space collapse and associated spondylosis with stenosis at C4-5 and C5-6.  Patient presents now for two-level anterior cervical decompression and fusion in hopes of improving her symptoms.  Past Medical History:  Diagnosis Date  . Anxiety   . Arthritis    osteoarthritis  . GERD (gastroesophageal reflux disease)   . Headache    migraines  . History of bleeding ulcers 2010  . History of hiatal hernia   . Hypertension   . Neuromuscular disorder (HCC)    tremors  . Occasional tremors     Past Surgical History:  Procedure Laterality Date  . benign tumor removal from left breast 2003  2003  . BUNIONECTOMY Left    Great toe  . COLONOSCOPY      History reviewed. No pertinent family history. Social History:  reports that she has quit smoking. She has never used smokeless tobacco. She reports that she does not drink alcohol or use drugs.  Allergies:  Allergies  Allergen Reactions  . Codeine Hives and Itching  . Doxycycline Nausea And Vomiting  . Nsaids Other (See Comments)    Gi bleeding  . Sulfonamide Derivatives Swelling  . Prednisone Anxiety    Medications Prior to Admission  Medication Sig Dispense Refill  . acetaminophen (TYLENOL) 500 MG tablet Take 500-1,000 mg by mouth 2 (two) times daily as needed for moderate pain.    Marland Kitchen ALPRAZolam (XANAX) 0.25 MG tablet Take 0.125-0.25 mg by mouth at bedtime as needed for anxiety or sleep.    Marland Kitchen ascorbic acid (VITAMIN C) 500 MG tablet Take 500 mg by mouth daily.    . cholecalciferol (VITAMIN D3) 25 MCG (1000 UNIT) tablet Take 1,000 Units by mouth daily.    . Multiple Vitamin (MULTIVITAMIN WITH MINERALS) TABS tablet Take 1 tablet by mouth daily.    Marland Kitchen omeprazole  (PRILOSEC) 20 MG capsule Take 20 mg by mouth See admin instructions. Take 20 mg daily, may take a second 20 mg dose as needed for acid reflux    . Phenylephrine-Acetaminophen (SINUS PRESSURE + PAIN) 5-325 MG TABS Take 1-2 tablets by mouth daily as needed (congestion).    Vladimir Faster Glycol-Propyl Glycol (SYSTANE OP) Place 1 drop into both eyes in the morning and at bedtime.    . Probiotic CAPS Take 1 capsule by mouth daily.    . ramipril (ALTACE) 5 MG capsule Take 5 mg by mouth 2 (two) times daily.     . sodium chloride (OCEAN) 0.65 % SOLN nasal spray Place 1 spray into both nostrils as needed for congestion.    . vitamin E 180 MG (400 UNITS) capsule Take 400 Units by mouth 3 (three) times a week.    . diclofenac sodium (VOLTAREN) 1 % GEL Apply 2 g topically 4 (four) times daily. To painful areas. (Patient not taking: Reported on 09/19/2019) 100 g 0  . SUMAtriptan (IMITREX) 50 MG tablet Take 50 mg by mouth every 2 (two) hours as needed for migraine.       Results for orders placed or performed during the hospital encounter of 09/26/19 (from the past 48 hour(s))  SARS CORONAVIRUS 2 (TAT 6-24 HRS) Nasopharyngeal Nasopharyngeal Swab     Status: None   Collection Time: 09/26/19 11:58 AM  Specimen: Nasopharyngeal Swab  Result Value Ref Range   SARS Coronavirus 2 NEGATIVE NEGATIVE    Comment: (NOTE) SARS-CoV-2 target nucleic acids are NOT DETECTED. The SARS-CoV-2 RNA is generally detectable in upper and lower respiratory specimens during the acute phase of infection. Negative results do not preclude SARS-CoV-2 infection, do not rule out co-infections with other pathogens, and should not be used as the sole basis for treatment or other patient management decisions. Negative results must be combined with clinical observations, patient history, and epidemiological information. The expected result is Negative. Fact Sheet for Patients: SugarRoll.be Fact Sheet for  Healthcare Providers: https://www.woods-mathews.com/ This test is not yet approved or cleared by the [place] FDA and  has been authorized for detection and/or diagnosis of SARS-CoV-2 by FDA under an Emergency Use Authorization (EUA). This EUA will remain  in effect (meaning this test can be used) for the duration of the COVID-19 declaration under Section 56 4(b)(1) of the Act, 21 U.S.C. section 360bbb-3(b)(1), unless the authorization is terminated or revoked sooner. Performed at [facility], [place] [address] [place], [place] [address]    No results found.  Pertinent items noted in HPI and remainder of comprehensive ROS otherwise negative.  Blood pressure (!) 174/62, pulse 78, temperature 98.4 F (36.9 C), resp. rate 18, height 5\' 5"  (1.651 m), weight 70.8 kg, SpO2 95 %.  Patient is awake and alert.  She is oriented and appropriate.  Speech is fluent.  Judgment and insight are intact.  Cranial nerve function normal bilateral.  Motor examination reveals some mild weakness in her grips and intrinsics bilaterally otherwise motor strength intact.  Sensory damage with patchy distal sensory loss in both upper extremities.  Reflexes are brisk.  No evidence of long track signs.  Gait and posture reasonably normal peer examination head ears eyes nose and throat is unremarked.  Chest and abdomen are benign.  Extremities are free from injury or deformity. Assessment/Plan C4-5, C5-6 spondylosis with stenosis.  Plan C4-5, C5-6 anterior cervical decompression and fusion with interbody cage, locally harvested autograft, and anterior plate instrumentation.  Risks and benefits of been explained.  Patient wishes to proceed.  Mallie Mussel A Kassey Laforest 09/28/2019, 7:56 AM

## 2019-09-28 NOTE — Evaluation (Addendum)
Occupational Therapy Evaluation Patient Details Name: Mckenzie Guerrero MRN: [id number] DOB: 21-Sep-1950 Today's Date: 09/28/2019    History of Present Illness 69 yo female s/p C4-5 C5-6 ACDF PMH anxiety, arthritis, hiatal hernia, tremors, bunionectomy L great toe   Clinical Impression   Patient is s/p C4-5 C5-6 ACDF surgery resulting in functional limitations due to the deficits listed below (see OT problem list). Pt educated on cervical precautions and need to sustain soft collar Patient will benefit from skilled OT acutely to increase independence and safety with ADLS to allow discharge home.     Follow Up Recommendations  No OT follow up    Equipment Recommendations  None recommended by OT    Recommendations for Other Services       Precautions / Restrictions Precautions Precautions: Cervical Precaution Booklet Issued: Yes (comment) Precaution Comments: adls with soft collar Required Braces or Orthoses: Cervical Brace Cervical Brace: Soft collar;At all times      Mobility Bed Mobility Overal bed mobility: Needs Assistance Bed Mobility: Supine to Sit;Rolling;Sit to Supine Rolling: Min assist   Supine to sit: Min assist Sit to supine: Min assist   General bed mobility comments: cues for sequence and avoid rolling onto back prior to lifting legs  Transfers Overall transfer level: Needs assistance Equipment used: 1 person hand held assist Transfers: Sit to/from Stand Sit to Stand: Min assist         General transfer comment: pt able to power up but needs (A) to balance    Balance                                           ADL either performed or assessed with clinical judgement   ADL Overall ADL's : Needs assistance/impaired Eating/Feeding: Set up;Sitting Eating/Feeding Details (indicate cue type and reason): pt near drop of cup drinking from straw with bil ue Grooming: Set up   Upper Body Bathing: Minimal assistance   Lower Body  Bathing: Minimal assistance   Upper Body Dressing : Minimal assistance   Lower Body Dressing: Minimal assistance   Toilet Transfer: Minimal assistance           Functional mobility during ADLs: Minimal assistance General ADL Comments: pt supine on arrival with soft collar. pt educated on wearing collar at all times against gravity and sleeping next session to focus on don doff brace. pt too sleepy to sustain attention to brace at that portion of eval.      Vision Baseline Vision/History: Wears glasses Wears Glasses: At all times       Perception     Praxis      Pertinent Vitals/Pain Pain Assessment: Faces Faces Pain Scale: Hurts a little bit Pain Location: neck Pain Descriptors / Indicators: Operative site guarding Pain Intervention(s): Monitored during session;Repositioned     Hand Dominance Right   Extremity/Trunk Assessment Upper Extremity Assessment Upper Extremity Assessment: RUE deficits/detail;LUE deficits/detail RUE Deficits / Details: decrease speed with fine motor pad to pad and decrease grasp on vup RUE Coordination: decreased fine motor;decreased gross motor LUE Coordination: decreased fine motor;decreased gross motor   Lower Extremity Assessment Lower Extremity Assessment: Defer to PT evaluation   Cervical / Trunk Assessment Cervical / Trunk Assessment: Other exceptions Cervical / Trunk Exceptions: s/p surg   Communication Communication Communication: No difficulties   Cognition Arousal/Alertness: Awake/alert Behavior During Therapy: Flat affect Overall Cognitive Status: Within Functional  Limits for tasks assessed                                 General Comments: pt reports feeling "sleepy"   General Comments  soft collar in place    Exercises  pt educated on pad to pad especially R hand for fine motor and scapula retractions ( lap slides)   Shoulder Instructions      Home Living Family/patient expects to be discharged to::  Private residence Living Arrangements: Alone Available Help at Discharge: Family;Available PRN/intermittently Type of Home: House       Home Layout: One level;Able to live on main level with bedroom/bathroom     Bathroom Shower/Tub: Teacher, early years/pre: Standard     Home Equipment: None   Additional Comments: reports son sister and boyfriend can help her upon d/c. drives at baseline      Prior Functioning/Environment Level of Independence: Independent                 OT Problem List: Decreased strength;Decreased activity tolerance;Impaired balance (sitting and/or standing);Decreased cognition;Decreased safety awareness;Decreased knowledge of use of DME or AE;Pain;Impaired UE functional use      OT Treatment/Interventions: Self-care/ADL training;Therapeutic exercise;Neuromuscular education;Energy conservation;DME and/or AE instruction;Manual therapy;Modalities;Therapeutic activities;Patient/family education;Balance training    OT Goals(Current goals can be found in the care plan section) Acute Rehab OT Goals Patient Stated Goal: to take a nap OT Goal Formulation: With patient/family Time For Goal Achievement: 10/12/19 Potential to Achieve Goals: Good  OT Frequency: Min 2X/week   Barriers to D/C:            Co-evaluation              AM-PAC OT "6 Clicks" Daily Activity     Outcome Measure Help from another person eating meals?: A Little Help from another person taking care of personal grooming?: A Little Help from another person toileting, which includes using toliet, bedpan, or urinal?: A Lot Help from another person bathing (including washing, rinsing, drying)?: A Lot Help from another person to put on and taking off regular upper body clothing?: A Little Help from another person to put on and taking off regular lower body clothing?: A Lot 6 Click Score: 15   End of Session Equipment Utilized During Treatment: Cervical collar Nurse  Communication: Mobility status;Precautions  Activity Tolerance: Patient tolerated treatment well Patient left: in bed;with call bell/phone within reach;with bed alarm set;with family/visitor present  OT Visit Diagnosis: Unsteadiness on feet (R26.81);Muscle weakness (generalized) (M62.81)                Time: [id number] OT Time Calculation (min): 17 min Charges:  OT General Charges $OT Visit: 1 Visit OT Evaluation $OT Eval Moderate Complexity: 1 Mod   Mckenzie Guerrero, OTR/L  Acute Rehabilitation Services Pager: [phone] Office: [phone] .   Jeri Modena 09/28/2019, 3:28 PM

## 2019-09-28 NOTE — Anesthesia Procedure Notes (Signed)
Procedure Name: Intubation Date/Time: 09/28/2019 8:17 AM Performed by: Barrington Ellison, CRNA Pre-anesthesia Checklist: Patient identified, Emergency Drugs available, Suction available and Patient being monitored Patient Re-evaluated:Patient Re-evaluated prior to induction Oxygen Delivery Method: Circle System Utilized Preoxygenation: Pre-oxygenation with 100% oxygen Induction Type: IV induction Ventilation: Mask ventilation without difficulty Laryngoscope Size: Glidescope and 3 Grade View: Grade I Tube type: Oral Tube size: 7.0 mm Number of attempts: 1 Airway Equipment and Method: Stylet and Oral airway Placement Confirmation: ETT inserted through vocal cords under direct vision,  positive ETCO2 and breath sounds checked- equal and bilateral Secured at: 21 cm Tube secured with: Tape Dental Injury: Teeth and Oropharynx as per pre-operative assessment  Comments: Elective glidescope used d/t neck surgery

## 2019-09-28 NOTE — Discharge Instructions (Addendum)
Wound Care °Keep incision area dry.  °You may remove outer bandage after 2 days and shower.  ° If you shower prior cover incision with plastic wrap.  °Do not put any creams, lotions, or ointments on incision. °Leave steri-strips on neck.  They will fall off by themselves. °Activity °Walk each and every day, increasing distance each day. °No lifting greater than 5 lbs.  Avoid excessive neck motion. °No driving for 2 weeks; may ride as a passenger locally. °Wear neck brace at all times except when showering. °Diet °Resume your normal diet.  °Return to Work °Will be discussed at you follow up appointment. °Call Your Doctor If Any of These Occur °Redness, drainage, or swelling at the wound.  °Temperature greater than 101 degrees. °Severe pain not relieved by pain medication. °Increased difficulty swallowing.  °Incision starts to come apart. °Follow Up Appt °Call [phone]) or for problems.  If you have any hardware placed in your spine, you will need an x-ray before your appointment.  °

## 2019-09-28 NOTE — Anesthesia Postprocedure Evaluation (Signed)
Anesthesia Post Note  Patient: RHYANNON BARRINEAU  Procedure(s) Performed: Anterior Cervical Discectomy Fusion - Cervical Four-Cervical Five- Cervical Five-Cervical Six (N/A )     Patient location during evaluation: PACU Anesthesia Type: General Level of consciousness: awake and alert Pain management: pain level controlled Vital Signs Assessment: post-procedure vital signs reviewed and stable Respiratory status: spontaneous breathing, nonlabored ventilation, respiratory function stable and patient connected to nasal cannula oxygen Cardiovascular status: blood pressure returned to baseline and stable Postop Assessment: no apparent nausea or vomiting Anesthetic complications: no    Last Vitals:  Vitals:   09/28/19 1243 09/28/19 1532  BP: 121/65 127/84  Pulse: 71 (!) 57  Resp: 18 18  Temp: 36.5 C 36.5 C  SpO2: 95% 95%    Last Pain:  Vitals:   09/28/19 1532  TempSrc: Oral  PainSc:                  Adonai Selsor P Tanara Turvey

## 2019-09-29 DIAGNOSIS — M4712 Other spondylosis with myelopathy, cervical region: Secondary | ICD-10-CM | POA: Diagnosis not present

## 2019-09-29 NOTE — Progress Notes (Signed)
Occupational Therapy Treatment Patient Details Name: Mckenzie Guerrero MRN: [id number] DOB: January 12, 1951 Today's Date: 09/29/2019    History of present illness 69 yo female s/p C4-5 C5-6 ACDF PMH anxiety, arthritis, hiatal hernia, tremors, bunionectomy L great toe   OT comments  Pt progressing to OOB ADL and ADL routine. Pt requiring assist to lift BUE upward to donn shirt; pt able to donn button up shirt and manage buttons Pt requiring assist for figure 4 technique; pt able to donn all LB dressing with set-upA to minA due to BUE weakness and poor ROM at shoulders. Pt performing own toilet hygiene abiding by precautions. Pt shown her and her son the process of donning/doffing soft collar and ways to clean it. Back handout provided and reviewed  ADL in detail. Pt educated on: clothing between brace, never sleep in brace, set an alarm at night for medication, avoid sitting for long periods of time, correct bed positioning for sleeping, correct sequence for bed mobility, avoiding lifting more than 5 pounds and never wash directly over incision. All education is complete and patient indicates understanding. Pt does not require continued OT skilled services. Education and goals met. OT d/c at this time.    Follow Up Recommendations  No OT follow up    Equipment Recommendations  None recommended by OT    Recommendations for Other Services      Precautions / Restrictions Precautions Precautions: Cervical Precaution Booklet Issued: Yes (comment) Precaution Comments: pt requiring cues to resist twisting Required Braces or Orthoses: Cervical Brace Cervical Brace: Soft collar;At all times Restrictions Weight Bearing Restrictions: No       Mobility Bed Mobility Overal bed mobility: Needs Assistance Bed Mobility: Supine to Sit     Supine to sit: Modified independent (Device/Increase time)     General bed mobility comments: pt use of rail minimally  Transfers Overall transfer level: Needs  assistance Equipment used: None Transfers: Sit to/from Stand Sit to Stand: Supervision              Balance Overall balance assessment: No apparent balance deficits (not formally assessed)                                         ADL either performed or assessed with clinical judgement   ADL Overall ADL's : Needs assistance/impaired     Grooming: Modified independent;Standing           Upper Body Dressing : Minimal assistance;Sitting Upper Body Dressing Details (indicate cue type and reason): Pt requiring assist to lift BUE upward to donn shirt; pt able to donn button up shirt and manage buttons Lower Body Dressing: Min guard;Minimal assistance;Cueing for safety;Sitting/lateral leans;Sit to/from stand Lower Body Dressing Details (indicate cue type and reason): Assist for figure 4 technique; pt able to donn all LB dressing with set-upA to [employer] Toilet Transfer: Min guard   Toileting- Clothing Manipulation and Hygiene: Supervision/safety       Functional mobility during ADLs: Modified independent General ADL Comments: Pt shown her and her son the process of donning/doffing soft collar and ways to clean it. Pt performing LB dress technique with a combination of hip hike and figure 4 technique.     Vision   Vision Assessment?: No apparent visual deficits   Perception     Praxis      Cognition Arousal/Alertness: Awake/alert Behavior During Therapy: Flat affect Overall Cognitive Status:  Within Functional Limits for tasks assessed                                 General Comments: HOH having to repeat instructions        Exercises     Shoulder Instructions       General Comments      Pertinent Vitals/ Pain       Pain Assessment: Faces Faces Pain Scale: Hurts a little bit Pain Location: neck; arms Pain Descriptors / Indicators: Discomfort Pain Intervention(s): Limited activity within patient's tolerance;Monitored during  session  Home Living                                          Prior Functioning/Environment              Frequency  Min 2X/week        Progress Toward Goals  OT Goals(current goals can now be found in the care plan section)  Progress towards OT goals: Progressing toward goals  Acute Rehab OT Goals Patient Stated Goal: to go home OT Goal Formulation: With patient/family Time For Goal Achievement: 10/12/19 Potential to Achieve Goals: Good ADL Goals Pt Will Transfer to Toilet: with supervision Pt/caregiver will Perform Home Exercise Program: Independently;With written HEP provided Additional ADL Goal #1: pt will complete don doff cervical collar mod i  Plan All goals met and education completed, patient discharged from OT services    Co-evaluation                 AM-PAC OT "6 Clicks" Daily Activity     Outcome Measure   Help from another person eating meals?: A Little Help from another person taking care of personal grooming?: A Little Help from another person toileting, which includes using toliet, bedpan, or urinal?: A Lot Help from another person bathing (including washing, rinsing, drying)?: A Lot Help from another person to put on and taking off regular upper body clothing?: A Little Help from another person to put on and taking off regular lower body clothing?: A Lot 6 Click Score: 15    End of Session Equipment Utilized During Treatment: Cervical collar  OT Visit Diagnosis: Unsteadiness on feet (R26.81);Muscle weakness (generalized) (M62.81)   Activity Tolerance Patient tolerated treatment well   Patient Left in bed;with call bell/phone within reach;with family/visitor present   Nurse Communication Mobility status;Precautions        Time: 9935-7017 OT Time Calculation (min): 29 min  Charges: OT General Charges $OT Visit: 1 Visit OT Treatments $Self Care/Home Management : 23-37 mins  Jefferey Pica, OTR/L Acute  Rehabilitation Services Pager: [phone] Office: [phone]    Emanual Lamountain C 09/29/2019, 10:41 AM

## 2019-09-29 NOTE — Plan of Care (Signed)
Patient alert and oriented, mae's well, voiding adequate amount of urine, swallowing without difficulty, no c/o pain at time of discharge. Patient discharged home with family. Script and discharged instructions given to patient. Patient and family stated understanding of instructions given. Patient has an appointment with Dr. Pool  

## 2019-10-02 ENCOUNTER — Encounter: Payer: Self-pay | Admitting: *Deleted

## 2019-11-01 DIAGNOSIS — Z981 Arthrodesis status: Secondary | ICD-10-CM | POA: Diagnosis not present

## 2019-11-29 DIAGNOSIS — M4802 Spinal stenosis, cervical region: Secondary | ICD-10-CM | POA: Diagnosis not present

## 2019-12-27 DIAGNOSIS — M4802 Spinal stenosis, cervical region: Secondary | ICD-10-CM | POA: Diagnosis not present

## 2020-01-21 DIAGNOSIS — R5381 Other malaise: Secondary | ICD-10-CM | POA: Diagnosis not present

## 2020-01-21 DIAGNOSIS — M25532 Pain in left wrist: Secondary | ICD-10-CM | POA: Diagnosis not present

## 2020-01-21 DIAGNOSIS — M25531 Pain in right wrist: Secondary | ICD-10-CM | POA: Diagnosis not present

## 2020-01-21 DIAGNOSIS — I10 Essential (primary) hypertension: Secondary | ICD-10-CM | POA: Diagnosis not present

## 2020-01-21 DIAGNOSIS — R5383 Other fatigue: Secondary | ICD-10-CM | POA: Diagnosis not present

## 2020-01-21 DIAGNOSIS — F5101 Primary insomnia: Secondary | ICD-10-CM | POA: Diagnosis not present

## 2020-01-21 DIAGNOSIS — M159 Polyosteoarthritis, unspecified: Secondary | ICD-10-CM | POA: Diagnosis not present

## 2020-01-21 DIAGNOSIS — F330 Major depressive disorder, recurrent, mild: Secondary | ICD-10-CM | POA: Diagnosis not present

## 2020-01-21 DIAGNOSIS — E7800 Pure hypercholesterolemia, unspecified: Secondary | ICD-10-CM | POA: Diagnosis not present

## 2020-01-21 DIAGNOSIS — J3089 Other allergic rhinitis: Secondary | ICD-10-CM | POA: Diagnosis not present

## 2020-03-13 DIAGNOSIS — M4802 Spinal stenosis, cervical region: Secondary | ICD-10-CM | POA: Diagnosis not present

## 2020-03-18 DIAGNOSIS — R300 Dysuria: Secondary | ICD-10-CM | POA: Diagnosis not present

## 2020-03-18 DIAGNOSIS — R5383 Other fatigue: Secondary | ICD-10-CM | POA: Diagnosis not present

## 2020-03-18 DIAGNOSIS — R5381 Other malaise: Secondary | ICD-10-CM | POA: Diagnosis not present

## 2020-03-18 DIAGNOSIS — I10 Essential (primary) hypertension: Secondary | ICD-10-CM | POA: Diagnosis not present

## 2020-03-27 DIAGNOSIS — H40013 Open angle with borderline findings, low risk, bilateral: Secondary | ICD-10-CM | POA: Diagnosis not present

## 2020-03-27 DIAGNOSIS — H25813 Combined forms of age-related cataract, bilateral: Secondary | ICD-10-CM | POA: Diagnosis not present

## 2020-04-28 DIAGNOSIS — Z23 Encounter for immunization: Secondary | ICD-10-CM | POA: Diagnosis not present

## 2020-05-09 DIAGNOSIS — M79641 Pain in right hand: Secondary | ICD-10-CM | POA: Diagnosis not present

## 2020-05-09 DIAGNOSIS — M180 Bilateral primary osteoarthritis of first carpometacarpal joints: Secondary | ICD-10-CM | POA: Diagnosis not present

## 2020-05-09 DIAGNOSIS — M79642 Pain in left hand: Secondary | ICD-10-CM | POA: Diagnosis not present

## 2020-06-03 DIAGNOSIS — J0140 Acute pansinusitis, unspecified: Secondary | ICD-10-CM | POA: Diagnosis not present

## 2020-07-12 HISTORY — PX: ESOPHAGOGASTRODUODENOSCOPY: SHX1529

## 2020-08-12 DIAGNOSIS — Z0001 Encounter for general adult medical examination with abnormal findings: Secondary | ICD-10-CM | POA: Diagnosis not present

## 2020-08-12 DIAGNOSIS — K219 Gastro-esophageal reflux disease without esophagitis: Secondary | ICD-10-CM | POA: Diagnosis not present

## 2020-08-12 DIAGNOSIS — G43709 Chronic migraine without aura, not intractable, without status migrainosus: Secondary | ICD-10-CM | POA: Diagnosis not present

## 2020-08-12 DIAGNOSIS — R5381 Other malaise: Secondary | ICD-10-CM | POA: Diagnosis not present

## 2020-08-12 DIAGNOSIS — I10 Essential (primary) hypertension: Secondary | ICD-10-CM | POA: Diagnosis not present

## 2020-08-12 DIAGNOSIS — E7800 Pure hypercholesterolemia, unspecified: Secondary | ICD-10-CM | POA: Diagnosis not present

## 2020-08-12 DIAGNOSIS — Z8249 Family history of ischemic heart disease and other diseases of the circulatory system: Secondary | ICD-10-CM | POA: Diagnosis not present

## 2020-08-12 DIAGNOSIS — Z0000 Encounter for general adult medical examination without abnormal findings: Secondary | ICD-10-CM | POA: Diagnosis not present

## 2020-08-12 DIAGNOSIS — Z1321 Encounter for screening for nutritional disorder: Secondary | ICD-10-CM | POA: Diagnosis not present

## 2020-08-12 DIAGNOSIS — M25531 Pain in right wrist: Secondary | ICD-10-CM | POA: Diagnosis not present

## 2020-08-12 DIAGNOSIS — M25532 Pain in left wrist: Secondary | ICD-10-CM | POA: Diagnosis not present

## 2020-08-12 DIAGNOSIS — R5383 Other fatigue: Secondary | ICD-10-CM | POA: Diagnosis not present

## 2020-08-12 DIAGNOSIS — F5101 Primary insomnia: Secondary | ICD-10-CM | POA: Diagnosis not present

## 2020-08-21 DIAGNOSIS — R0981 Nasal congestion: Secondary | ICD-10-CM | POA: Diagnosis not present

## 2020-08-21 DIAGNOSIS — R070 Pain in throat: Secondary | ICD-10-CM | POA: Diagnosis not present

## 2020-08-21 DIAGNOSIS — Z20828 Contact with and (suspected) exposure to other viral communicable diseases: Secondary | ICD-10-CM | POA: Diagnosis not present

## 2020-08-21 DIAGNOSIS — R051 Acute cough: Secondary | ICD-10-CM | POA: Diagnosis not present

## 2020-08-21 DIAGNOSIS — J0100 Acute maxillary sinusitis, unspecified: Secondary | ICD-10-CM | POA: Diagnosis not present

## 2020-08-21 DIAGNOSIS — M791 Myalgia, unspecified site: Secondary | ICD-10-CM | POA: Diagnosis not present

## 2020-09-22 DIAGNOSIS — U099 Post covid-19 condition, unspecified: Secondary | ICD-10-CM | POA: Diagnosis not present

## 2020-09-22 DIAGNOSIS — M19022 Primary osteoarthritis, left elbow: Secondary | ICD-10-CM | POA: Diagnosis not present

## 2020-09-22 DIAGNOSIS — M19021 Primary osteoarthritis, right elbow: Secondary | ICD-10-CM | POA: Diagnosis not present

## 2020-09-22 DIAGNOSIS — M19042 Primary osteoarthritis, left hand: Secondary | ICD-10-CM | POA: Diagnosis not present

## 2020-09-22 DIAGNOSIS — J0140 Acute pansinusitis, unspecified: Secondary | ICD-10-CM | POA: Diagnosis not present

## 2020-09-22 DIAGNOSIS — M19041 Primary osteoarthritis, right hand: Secondary | ICD-10-CM | POA: Diagnosis not present

## 2020-10-14 DIAGNOSIS — K2100 Gastro-esophageal reflux disease with esophagitis, without bleeding: Secondary | ICD-10-CM | POA: Diagnosis not present

## 2020-10-14 DIAGNOSIS — R1013 Epigastric pain: Secondary | ICD-10-CM | POA: Diagnosis not present

## 2020-10-14 DIAGNOSIS — Z8719 Personal history of other diseases of the digestive system: Secondary | ICD-10-CM | POA: Diagnosis not present

## 2020-11-24 DIAGNOSIS — D500 Iron deficiency anemia secondary to blood loss (chronic): Secondary | ICD-10-CM | POA: Diagnosis not present

## 2020-12-05 DIAGNOSIS — H40013 Open angle with borderline findings, low risk, bilateral: Secondary | ICD-10-CM | POA: Diagnosis not present

## 2020-12-05 DIAGNOSIS — H25813 Combined forms of age-related cataract, bilateral: Secondary | ICD-10-CM | POA: Diagnosis not present

## 2020-12-19 DIAGNOSIS — R1013 Epigastric pain: Secondary | ICD-10-CM | POA: Diagnosis not present

## 2020-12-19 DIAGNOSIS — I10 Essential (primary) hypertension: Secondary | ICD-10-CM | POA: Diagnosis not present

## 2020-12-19 DIAGNOSIS — K5733 Diverticulitis of large intestine without perforation or abscess with bleeding: Secondary | ICD-10-CM | POA: Diagnosis not present

## 2020-12-19 DIAGNOSIS — K222 Esophageal obstruction: Secondary | ICD-10-CM | POA: Diagnosis not present

## 2020-12-19 DIAGNOSIS — K5730 Diverticulosis of large intestine without perforation or abscess without bleeding: Secondary | ICD-10-CM | POA: Diagnosis not present

## 2020-12-19 DIAGNOSIS — K648 Other hemorrhoids: Secondary | ICD-10-CM | POA: Diagnosis not present

## 2020-12-19 DIAGNOSIS — Z1211 Encounter for screening for malignant neoplasm of colon: Secondary | ICD-10-CM | POA: Diagnosis not present

## 2020-12-19 DIAGNOSIS — K219 Gastro-esophageal reflux disease without esophagitis: Secondary | ICD-10-CM | POA: Diagnosis not present

## 2020-12-19 DIAGNOSIS — K643 Fourth degree hemorrhoids: Secondary | ICD-10-CM | POA: Diagnosis not present

## 2020-12-19 DIAGNOSIS — Z800 Family history of malignant neoplasm of digestive organs: Secondary | ICD-10-CM | POA: Diagnosis not present

## 2020-12-19 DIAGNOSIS — K449 Diaphragmatic hernia without obstruction or gangrene: Secondary | ICD-10-CM | POA: Diagnosis not present

## 2020-12-29 DIAGNOSIS — R1310 Dysphagia, unspecified: Secondary | ICD-10-CM | POA: Diagnosis not present

## 2020-12-29 DIAGNOSIS — R102 Pelvic and perineal pain: Secondary | ICD-10-CM | POA: Diagnosis not present

## 2021-02-02 DIAGNOSIS — Z800 Family history of malignant neoplasm of digestive organs: Secondary | ICD-10-CM | POA: Diagnosis not present

## 2021-02-02 DIAGNOSIS — K649 Unspecified hemorrhoids: Secondary | ICD-10-CM | POA: Diagnosis not present

## 2021-02-02 DIAGNOSIS — K5790 Diverticulosis of intestine, part unspecified, without perforation or abscess without bleeding: Secondary | ICD-10-CM | POA: Diagnosis not present

## 2021-02-02 DIAGNOSIS — K219 Gastro-esophageal reflux disease without esophagitis: Secondary | ICD-10-CM | POA: Diagnosis not present

## 2021-02-02 DIAGNOSIS — K449 Diaphragmatic hernia without obstruction or gangrene: Secondary | ICD-10-CM | POA: Diagnosis not present

## 2021-02-02 DIAGNOSIS — R1013 Epigastric pain: Secondary | ICD-10-CM | POA: Diagnosis not present

## 2021-02-10 DIAGNOSIS — R350 Frequency of micturition: Secondary | ICD-10-CM | POA: Diagnosis not present

## 2021-02-10 DIAGNOSIS — R5381 Other malaise: Secondary | ICD-10-CM | POA: Diagnosis not present

## 2021-02-10 DIAGNOSIS — K219 Gastro-esophageal reflux disease without esophagitis: Secondary | ICD-10-CM | POA: Diagnosis not present

## 2021-02-10 DIAGNOSIS — M159 Polyosteoarthritis, unspecified: Secondary | ICD-10-CM | POA: Diagnosis not present

## 2021-02-10 DIAGNOSIS — I10 Essential (primary) hypertension: Secondary | ICD-10-CM | POA: Diagnosis not present

## 2021-02-10 DIAGNOSIS — F320 Major depressive disorder, single episode, mild: Secondary | ICD-10-CM | POA: Diagnosis not present

## 2021-02-10 DIAGNOSIS — R5383 Other fatigue: Secondary | ICD-10-CM | POA: Diagnosis not present

## 2021-02-10 DIAGNOSIS — G43709 Chronic migraine without aura, not intractable, without status migrainosus: Secondary | ICD-10-CM | POA: Diagnosis not present

## 2021-02-10 DIAGNOSIS — F5101 Primary insomnia: Secondary | ICD-10-CM | POA: Diagnosis not present

## 2021-02-10 DIAGNOSIS — Z87898 Personal history of other specified conditions: Secondary | ICD-10-CM | POA: Diagnosis not present

## 2021-02-10 DIAGNOSIS — Z8719 Personal history of other diseases of the digestive system: Secondary | ICD-10-CM | POA: Diagnosis not present

## 2021-03-01 IMAGING — RF DG CERVICAL SPINE 1V
1 series · 1 of 1 positions shown · non-contrast
Comparison: MR cervical spine 07/26/2019

FLUOROSCOPY TIME:  0 minutes 5 seconds

Images submitted: 1

CLINICAL DATA: Anterior cervical discectomy and fusion C4-C6

EXAM:
DG CERVICAL SPINE - 1 VIEW; DG C-ARM 1-60 MIN

[Series 1: run · 1 of 1 slices shown]
[im 1/1]
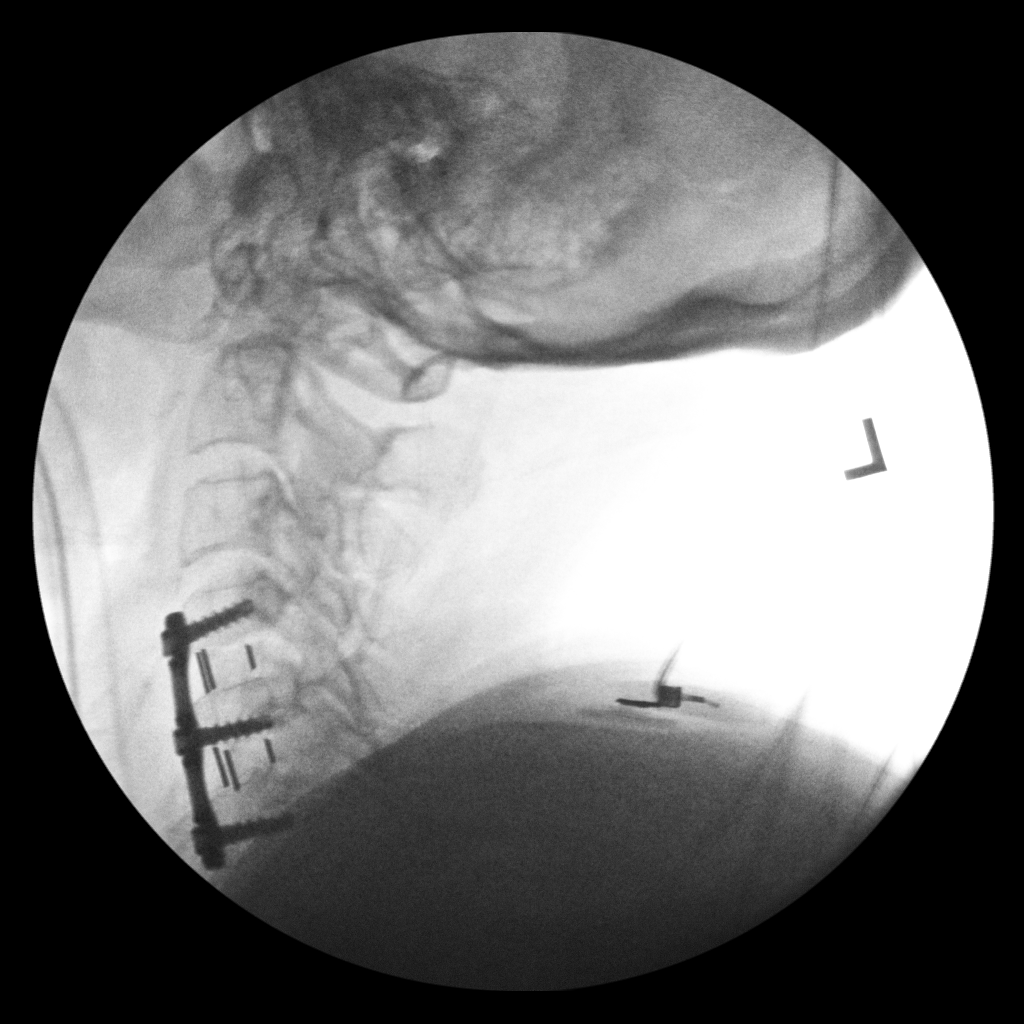

[1 of 1 positions shown; findings below may reference images not displayed]

FINDINGS: Anterior plate and screws identified at C4-C5-C6 with intervening
disc prostheses at the C4-C5 and C5-C6 disc spaces.

Bones demineralized.

Vertebral body heights maintained.

No fracture or subluxation.
IMPRESSION: Anterior fusion C4-C6.

## 2021-04-10 DIAGNOSIS — K449 Diaphragmatic hernia without obstruction or gangrene: Secondary | ICD-10-CM | POA: Diagnosis not present

## 2021-05-04 NOTE — Progress Notes (Signed)
Sent message, via epic in basket, requesting orders in epic from surgeon.  

## 2021-05-05 ENCOUNTER — Ambulatory Visit: Payer: Self-pay | Admitting: Surgery

## 2021-05-06 NOTE — Progress Notes (Addendum)
Anesthesia Review:  PCP: DR Gilford Rile  Cardiologist : Chest x-ray : EKG :05/08/21  Echo : Stress test: Cardiac Cath :  Activity level: can do a flight of stairs witout difficulty  Sleep Study/ CPAP : none  Fasting Blood Sugar :      / Checks Blood Sugar -- times a day:   Blood Thinner/ Instructions /Last Dose: ASA / Instructions/ Last Dose :   Covid tstr on 05/21/2021.

## 2021-05-06 NOTE — Progress Notes (Signed)
DUE TO COVID-19 ONLY ONE VISITOR IS ALLOWED TO COME WITH YOU AND STAY IN THE WAITING ROOM ONLY DURING PRE OP AND PROCEDURE DAY OF SURGERY.  2 VISITOR  MAY VISIT WITH YOU AFTER SURGERY IN YOUR PRIVATE ROOM DURING VISITING HOURS ONLY!  YOU NEED TO HAVE A COVID 19 TEST ON__ 05/21/2021 @_  @_from  8am-3pm _____, THIS TEST MUST BE DONE BEFORE SURGERY,  Covid test is done at [place], [place] [address].  This is a drive thru.  No appt required. Please see map.                 Your procedure is scheduled on:  05/25/2021   Report to [facility] Main  Entrance   Report to admitting at    1100AM     Call this number if you have problems the morning of surgery [phone]    REMEMBER: NO  SOLID FOOD CANDY OR GUM AFTER MIDNIGHT. CLEAR LIQUIDS UNTIL   1015 AM         . NOTHING BY MOUTH EXCEPT CLEAR LIQUIDS UNTIL  1015AM   . PLEASE FINISH ENSURE DRINK PER SURGEON ORDER  WHICH NEEDS TO BE COMPLETED AT  1015AM     .      CLEAR LIQUID DIET   Foods Allowed                                                                    Coffee and tea, regular and decaf                            Fruit ices (not with fruit pulp)                                      Iced Popsicles                                    Carbonated beverages, regular and diet                                    Cranberry, grape and apple juices Sports drinks like Gatorade Lightly seasoned clear broth or consume(fat free) Sugar, honey syrup ___________________________________________________________________      BRUSH YOUR TEETH MORNING OF SURGERY AND RINSE YOUR MOUTH OUT, NO CHEWING GUM CANDY OR MINTS.     Take these medicines the morning of surgery with A SIP OF WATER:  CLARITIN, PRILOSEC   DO NOT TAKE ANY DIABETIC MEDICATIONS DAY OF YOUR SURGERY                               You may not have any metal on your body including hair pins and              piercings  Do not wear jewelry, make-up, lotions,  powders or perfumes, deodorant  Do not wear nail polish on your fingernails.  Do not shave  48 hours prior to surgery.              Men may shave face and neck.   Do not bring valuables to the hospital. [place].  Contacts, dentures or bridgework may not be worn into surgery.  Leave suitcase in the car. After surgery it may be brought to your room.     Patients discharged the day of surgery will not be allowed to drive home. IF YOU ARE HAVING SURGERY AND GOING HOME THE SAME DAY, YOU MUST HAVE AN ADULT TO DRIVE YOU HOME AND BE WITH YOU FOR 24 HOURS. YOU MAY GO HOME BY TAXI OR UBER OR ORTHERWISE, BUT AN ADULT MUST ACCOMPANY YOU HOME AND STAY WITH YOU FOR 24 HOURS.  Name and phone number of your driver:  Special Instructions: N/A              Please read over the following fact sheets you were given: _____________________________________________________________________  [facility] - Preparing for Surgery Before surgery, you can play an important role.  Because skin is not sterile, your skin needs to be as free of germs as possible.  You can reduce the number of germs on your skin by washing with CHG (chlorahexidine gluconate) soap before surgery.  CHG is an antiseptic cleaner which kills germs and bonds with the skin to continue killing germs even after washing. Please DO NOT use if you have an allergy to CHG or antibacterial soaps.  If your skin becomes reddened/irritated stop using the CHG and inform your nurse when you arrive at Short Stay. Do not shave (including legs and underarms) for at least 48 hours prior to the first CHG shower.  You may shave your face/neck. Please follow these instructions carefully:  1.  Shower with CHG Soap the night before surgery and the  morning of Surgery.  2.  If you choose to wash your hair, wash your hair first as usual with your  normal  shampoo.  3.  After you shampoo, rinse your hair and body  thoroughly to remove the  shampoo.                           4.  Use CHG as you would any other liquid soap.  You can apply chg directly  to the skin and wash                       Gently with a scrungie or clean washcloth.  5.  Apply the CHG Soap to your body ONLY FROM THE NECK DOWN.   Do not use on face/ open                           Wound or open sores. Avoid contact with eyes, ears mouth and genitals (private parts).                       Wash face,  Genitals (private parts) with your normal soap.             6.  Wash thoroughly, paying special attention to the area where your surgery  will be performed.  7.  Thoroughly rinse your body with  warm water from the neck down.  8.  DO NOT shower/wash with your normal soap after using and rinsing off  the CHG Soap.                9.  Pat yourself dry with a clean towel.            10.  Wear clean pajamas.            11.  Place clean sheets on your bed the night of your first shower and do not  sleep with pets. Day of Surgery : Do not apply any lotions/deodorants the morning of surgery.  Please wear clean clothes to the hospital/surgery center.  FAILURE TO FOLLOW THESE INSTRUCTIONS MAY RESULT IN THE CANCELLATION OF YOUR SURGERY PATIENT SIGNATURE_________________________________  NURSE SIGNATURE__________________________________  ________________________________________________________________________

## 2021-05-08 ENCOUNTER — Encounter (HOSPITAL_COMMUNITY)
Admission: RE | Admit: 2021-05-08 | Discharge: 2021-05-08 | Disposition: A | Discharge status: Home / Self Care | Payer: PPO | Source: Ambulatory Visit | Attending: Surgery | Admitting: Surgery

## 2021-05-08 ENCOUNTER — Encounter (HOSPITAL_COMMUNITY): Payer: Self-pay

## 2021-05-08 ENCOUNTER — Other Ambulatory Visit: Payer: Self-pay

## 2021-05-08 VITALS — BP 156/90 | HR 67 | Temp 98.2°F | Resp 16 | Ht 65.0 in

## 2021-05-08 DIAGNOSIS — I10 Essential (primary) hypertension: Secondary | ICD-10-CM

## 2021-05-08 DIAGNOSIS — Z01818 Encounter for other preprocedural examination: Secondary | ICD-10-CM | POA: Insufficient documentation

## 2021-05-08 HISTORY — DX: Other specified postprocedural states: Z98.890

## 2021-05-08 HISTORY — DX: Nausea with vomiting, unspecified: R11.2

## 2021-05-08 HISTORY — DX: Other complications of anesthesia, initial encounter: T88.59XA

## 2021-05-08 LAB — BASIC METABOLIC PANEL
Anion gap: 7 (ref 5–15)
BUN: 17 mg/dL (ref 8–23)
CO2: 27 mmol/L (ref 22–32)
Calcium: 9.6 mg/dL (ref 8.9–10.3)
Chloride: 103 mmol/L (ref 98–111)
Creatinine, Ser: 0.73 mg/dL (ref 0.44–1.00)
GFR, Estimated: >60 mL/min (ref >60)
Glucose, Bld: 87 mg/dL (ref 70–99)
Potassium: 4.6 mmol/L (ref 3.5–5.1)
Sodium: 137 mmol/L (ref 135–145)

## 2021-05-08 LAB — CBC
HCT: 44.5 % (ref 36.0–46.0)
Hemoglobin: 14.5 g/dL (ref 12.0–15.0)
MCH: 30.4 pg (ref 26.0–34.0)
MCHC: 32.6 g/dL (ref 30.0–36.0)
MCV: 93.3 fL (ref 80.0–100.0)
Platelets: 393 10*3/uL (ref 150–400)
RBC: 4.77 MIL/uL (ref 3.87–5.11)
RDW: 12.6 % (ref 11.5–15.5)
WBC: 11.3 10*3/uL — ABNORMAL HIGH (ref 4.0–10.5)
nRBC: 0 % (ref 0.0–0.2)

## 2021-05-12 DIAGNOSIS — Z23 Encounter for immunization: Secondary | ICD-10-CM | POA: Diagnosis not present

## 2021-05-21 ENCOUNTER — Other Ambulatory Visit: Payer: Self-pay | Admitting: Surgery

## 2021-05-21 LAB — SARS CORONAVIRUS 2 (TAT 6-24 HRS): SARS Coronavirus 2: NEGATIVE

## 2021-05-22 NOTE — H&P (Signed)
REFERRING PHYSICIAN: Misenheimer, Kathlene November,*  PROVIDER: Joya San, MD  MRN: [id number] DOB: 1950/12/10 DATE OF ENCOUNTER: 04/10/2021  Subjective   Chief Complaint: Hernia   History of Present Illness: Mckenzie Guerrero is a 70 y.o. female who is seen today as an office consultation at the request of Dr. Lyda Jester for evaluation of Hernia .   She has an upper GI series that shows about half or more of her stomach in her chest. She has a history of gastroesophageal reflux disease. She is got more problems with shifting pain when she moves from side to side and the way she sleeps. She is on multiple medications. She had an upper GI endoscopy with biopsy by Dr. Lyda Jester and he kept her on omeprazole 40 mg every morning and as needed antacids. It was after that finding the large hiatal hernia that he got an upper GI series that showed half of her stomach in her chest.  I explained how we do robotic and laparoscopic hiatal hernia repair. She is at a point where she would like to go ahead and proceed with that. We will look towards scheduling on the XI robot at [facility] under general anesthesia.  Review of Systems: See HPI as well for other ROS.  ROS   Medical History: Past Medical History:  Diagnosis Date   Anxiety   Arthritis   GERD (gastroesophageal reflux disease)   Patient Active Problem List  Diagnosis   Arthrodesis status   Cervical spondylosis with myelopathy and radiculopathy   Essential hypertension   GERD without esophagitis   History of hiatal hernia   Major depressive disorder with current active episode   Pure hypercholesterolemia   Past Surgical History:  Procedure Laterality Date   Bone spers  09/28/2019   skin cancer on nose  2 1/2 years ago    Allergies  Allergen Reactions   Aspirin Other (See Comments)  GI bleeding   Sulfa (Sulfonamide Antibiotics) Swelling   Prednisone Anxiety   Current Outpatient Medications on File Prior to Visit   Medication Sig Dispense Refill   ramipriL (ALTACE) 5 MG capsule TAKE 1 CAPSULE BY MOUTH bid   SUMAtriptan (IMITREX) 50 MG tablet Take by mouth every 2 (two) hours as needed   ALPRAZolam (XANAX) 0.25 MG tablet TAKE 1/2 TO 1 (ONE-HALF TO ONE) TABLET BY MOUTH AT NIGHT AS NEEDED FOR RESCUE/ANXIETY   omeprazole (PRILOSEC) 40 MG DR capsule   No current facility-administered medications on file prior to visit.   Family History  Family history unknown: Yes    Social History   Tobacco Use  Smoking Status Former Smoker   Quit date: 1985   Years since quitting: 37.7  Smokeless Tobacco Former Systems developer    Social History   Socioeconomic History   Marital status: Divorced  Tobacco Use   Smoking status: Former Smoker  Quit date: 1985  Years since quitting: 37.7   Smokeless tobacco: Former Network engineer and Sexual Activity   Alcohol use: Never   Drug use: Never   Objective:   Vitals:  04/10/21 1634  BP: (!) 176/88  Pulse: 87  Temp: 36.8 C (98.2 F)  SpO2: 98%  Weight: 64.1 kg (141 lb 6.4 oz)  Height: 165.1 cm (5\' 5" )   Body mass index is 23.53 kg/m.  Physical Exam General: A thin white female no acute distress HEENT : Unremarkable Chest: Clear to auscultation Heart: Sinus rhythm without murmurs Breast: Not examined Abdomen: She has some tenderness at her xiphoid  but no masses noted GU not examined Rectal not performed Extremities full range of motion Neuro alert and oriented x3. Motor and sensory function grossly intact  Labs, Imaging and Diagnostic Testing: Upper GI [facility] [place] [facility] was reviewed  Assessment and Plan:   Hiatal hernia-I have discussed repair of diaphragm with possible fundoplication with her.      Large type III hiatal hernia that is symptomatic. We will schedule for robotic repair of hiatal hernia at [facility].    Jamekia Gannett Donia Pounds, MD

## 2021-05-25 ENCOUNTER — Inpatient Hospital Stay (HOSPITAL_COMMUNITY)
Admission: RE | Admit: 2021-05-25 | Discharge: 2021-05-27 | DRG: 328 | Disposition: A | Discharge status: Home / Self Care | Payer: PPO | Attending: Surgery | Admitting: Surgery

## 2021-05-25 ENCOUNTER — Ambulatory Visit (HOSPITAL_COMMUNITY): Payer: PPO | Admitting: Anesthesiology

## 2021-05-25 ENCOUNTER — Encounter (HOSPITAL_COMMUNITY)
Admission: RE | Disposition: A | Discharge status: Home / Self Care | Payer: Self-pay | Source: Home / Self Care | Attending: Surgery

## 2021-05-25 ENCOUNTER — Encounter (HOSPITAL_COMMUNITY): Payer: Self-pay | Admitting: Surgery

## 2021-05-25 DIAGNOSIS — Z886 Allergy status to analgesic agent status: Secondary | ICD-10-CM

## 2021-05-25 DIAGNOSIS — Z981 Arthrodesis status: Secondary | ICD-10-CM | POA: Diagnosis not present

## 2021-05-25 DIAGNOSIS — K219 Gastro-esophageal reflux disease without esophagitis: Secondary | ICD-10-CM | POA: Diagnosis present

## 2021-05-25 DIAGNOSIS — Z882 Allergy status to sulfonamides status: Secondary | ICD-10-CM

## 2021-05-25 DIAGNOSIS — Z888 Allergy status to other drugs, medicaments and biological substances status: Secondary | ICD-10-CM

## 2021-05-25 DIAGNOSIS — K449 Diaphragmatic hernia without obstruction or gangrene: Principal | ICD-10-CM | POA: Diagnosis present

## 2021-05-25 DIAGNOSIS — I10 Essential (primary) hypertension: Secondary | ICD-10-CM | POA: Diagnosis present

## 2021-05-25 DIAGNOSIS — Z79899 Other long term (current) drug therapy: Secondary | ICD-10-CM | POA: Diagnosis not present

## 2021-05-25 DIAGNOSIS — F419 Anxiety disorder, unspecified: Secondary | ICD-10-CM | POA: Diagnosis not present

## 2021-05-25 DIAGNOSIS — Z85828 Personal history of other malignant neoplasm of skin: Secondary | ICD-10-CM | POA: Diagnosis not present

## 2021-05-25 DIAGNOSIS — Z9889 Other specified postprocedural states: Secondary | ICD-10-CM

## 2021-05-25 DIAGNOSIS — F329 Major depressive disorder, single episode, unspecified: Secondary | ICD-10-CM | POA: Diagnosis present

## 2021-05-25 DIAGNOSIS — E7800 Pure hypercholesterolemia, unspecified: Secondary | ICD-10-CM | POA: Diagnosis present

## 2021-05-25 DIAGNOSIS — Z87891 Personal history of nicotine dependence: Secondary | ICD-10-CM | POA: Diagnosis not present

## 2021-05-25 HISTORY — PX: ESOPHAGOGASTRODUODENOSCOPY: SHX5428

## 2021-05-25 HISTORY — PX: XI ROBOTIC ASSISTED HIATAL HERNIA REPAIR: SHX6889

## 2021-05-25 LAB — CBC
HCT: 40.3 % (ref 36.0–46.0)
Hemoglobin: 13.2 g/dL (ref 12.0–15.0)
MCH: 30.4 pg (ref 26.0–34.0)
MCHC: 32.8 g/dL (ref 30.0–36.0)
MCV: 92.9 fL (ref 80.0–100.0)
Platelets: 362 10*3/uL (ref 150–400)
RBC: 4.34 MIL/uL (ref 3.87–5.11)
RDW: 12.9 % (ref 11.5–15.5)
WBC: 23.3 10*3/uL — ABNORMAL HIGH (ref 4.0–10.5)
nRBC: 0 % (ref 0.0–0.2)

## 2021-05-25 LAB — CREATININE, SERUM
Creatinine, Ser: 0.79 mg/dL (ref 0.44–1.00)
GFR, Estimated: >60 mL/min (ref >60)

## 2021-05-25 SURGERY — REPAIR, HERNIA, HIATAL, ROBOT-ASSISTED
Anesthesia: General | Site: Esophagus

## 2021-05-25 MED ORDER — ROCURONIUM BROMIDE 10 MG/ML (PF) SYRINGE
PREFILLED_SYRINGE | INTRAVENOUS | Status: AC
  Filled 2021-05-25: qty 10

## 2021-05-25 MED ORDER — SODIUM CHLORIDE 0.9 % IV SOLN
2.0000 g | Freq: Two times a day (BID) | INTRAVENOUS | Status: AC
  Administered 2021-05-26: 2 g via INTRAVENOUS
  Filled 2021-05-25: qty 2

## 2021-05-25 MED ORDER — ONDANSETRON HCL 4 MG/2ML IJ SOLN
4.0000 mg | Freq: Four times a day (QID) | INTRAMUSCULAR | Status: DC | PRN
  Administered 2021-05-26: 4 mg via INTRAVENOUS
  Filled 2021-05-25: qty 2

## 2021-05-25 MED ORDER — PANTOPRAZOLE SODIUM 40 MG IV SOLR
40.0000 mg | Freq: Every day | INTRAVENOUS | Status: DC
  Administered 2021-05-25: 40 mg via INTRAVENOUS
  Filled 2021-05-25: qty 40

## 2021-05-25 MED ORDER — PROCHLORPERAZINE EDISYLATE 10 MG/2ML IJ SOLN: 5.0000 mg | Freq: Four times a day (QID) | INTRAMUSCULAR | Status: DC | PRN

## 2021-05-25 MED ORDER — PHENYLEPHRINE HCL-NACL 20-0.9 MG/250ML-% IV SOLN
INTRAVENOUS | Status: DC | PRN
  Administered 2021-05-25: 60 ug/min via INTRAVENOUS

## 2021-05-25 MED ORDER — DEXAMETHASONE SODIUM PHOSPHATE 10 MG/ML IJ SOLN
INTRAMUSCULAR | Status: AC
  Filled 2021-05-25: qty 1

## 2021-05-25 MED ORDER — FENTANYL CITRATE (PF) 100 MCG/2ML IJ SOLN
INTRAMUSCULAR | Status: AC
  Filled 2021-05-25: qty 2

## 2021-05-25 MED ORDER — FENTANYL CITRATE (PF) 100 MCG/2ML IJ SOLN
INTRAMUSCULAR | Status: DC | PRN
  Administered 2021-05-25 (×2): 50 ug via INTRAVENOUS

## 2021-05-25 MED ORDER — 0.9 % SODIUM CHLORIDE (POUR BTL) OPTIME
TOPICAL | Status: DC | PRN
  Administered 2021-05-25: 1000 mL

## 2021-05-25 MED ORDER — PROPOFOL 10 MG/ML IV BOLUS
INTRAVENOUS | Status: AC
  Filled 2021-05-25: qty 20

## 2021-05-25 MED ORDER — KETAMINE HCL 10 MG/ML IJ SOLN
INTRAMUSCULAR | Status: AC
  Filled 2021-05-25: qty 1

## 2021-05-25 MED ORDER — ONDANSETRON 4 MG PO TBDP: 4.0000 mg | ORAL_TABLET | Freq: Four times a day (QID) | ORAL | Status: DC | PRN

## 2021-05-25 MED ORDER — EPHEDRINE SULFATE-NACL 50-0.9 MG/10ML-% IV SOSY
PREFILLED_SYRINGE | INTRAVENOUS | Status: DC | PRN
  Administered 2021-05-25 (×3): 5 mg via INTRAVENOUS

## 2021-05-25 MED ORDER — HYDROMORPHONE HCL 1 MG/ML IJ SOLN: 0.2500 mg | INTRAMUSCULAR | Status: DC | PRN

## 2021-05-25 MED ORDER — BUPIVACAINE LIPOSOME 1.3 % IJ SUSP
INTRAMUSCULAR | Status: DC | PRN
  Administered 2021-05-25: 20 mL

## 2021-05-25 MED ORDER — EPHEDRINE 5 MG/ML INJ
INTRAVENOUS | Status: AC
  Filled 2021-05-25: qty 5

## 2021-05-25 MED ORDER — LACTATED RINGERS IV SOLN
INTRAVENOUS | Status: AC | PRN
  Administered 2021-05-25: 1

## 2021-05-25 MED ORDER — BUPIVACAINE LIPOSOME 1.3 % IJ SUSP: 20.0000 mL | Freq: Once | INTRAMUSCULAR | Status: DC

## 2021-05-25 MED ORDER — SUCCINYLCHOLINE CHLORIDE 200 MG/10ML IV SOSY
PREFILLED_SYRINGE | INTRAVENOUS | Status: DC | PRN
  Administered 2021-05-25: 120 mg via INTRAVENOUS

## 2021-05-25 MED ORDER — ACETAMINOPHEN 500 MG PO TABS
1000.0000 mg | ORAL_TABLET | ORAL | Status: AC
  Administered 2021-05-25: 1000 mg via ORAL
  Filled 2021-05-25: qty 2

## 2021-05-25 MED ORDER — KCL IN DEXTROSE-NACL 20-5-0.45 MEQ/L-%-% IV SOLN
INTRAVENOUS | Status: DC
  Filled 2021-05-25 (×3): qty 1000

## 2021-05-25 MED ORDER — HEPARIN SODIUM (PORCINE) 5000 UNIT/ML IJ SOLN
5000.0000 [IU] | Freq: Once | INTRAMUSCULAR | Status: AC
  Administered 2021-05-25: 5000 [IU] via SUBCUTANEOUS
  Filled 2021-05-25: qty 1

## 2021-05-25 MED ORDER — SUGAMMADEX SODIUM 200 MG/2ML IV SOLN
INTRAVENOUS | Status: DC | PRN
  Administered 2021-05-25: 200 mg via INTRAVENOUS

## 2021-05-25 MED ORDER — SCOPOLAMINE 1 MG/3DAYS TD PT72
1.0000 | MEDICATED_PATCH | TRANSDERMAL | Status: DC
  Administered 2021-05-25: 1.5 mg via TRANSDERMAL
  Filled 2021-05-25: qty 1

## 2021-05-25 MED ORDER — METOPROLOL TARTRATE 5 MG/5ML IV SOLN: 5.0000 mg | Freq: Four times a day (QID) | INTRAVENOUS | Status: DC | PRN

## 2021-05-25 MED ORDER — SODIUM CHLORIDE 0.9 % IV SOLN
2.0000 g | INTRAVENOUS | Status: AC
  Administered 2021-05-25: 2 g via INTRAVENOUS
  Filled 2021-05-25: qty 2

## 2021-05-25 MED ORDER — ONDANSETRON HCL 4 MG/2ML IJ SOLN
INTRAMUSCULAR | Status: AC
  Filled 2021-05-25: qty 2

## 2021-05-25 MED ORDER — PROCHLORPERAZINE MALEATE 10 MG PO TABS
10.0000 mg | ORAL_TABLET | Freq: Four times a day (QID) | ORAL | Status: DC | PRN
  Filled 2021-05-25: qty 1

## 2021-05-25 MED ORDER — LACTATED RINGERS IV SOLN: INTRAVENOUS | Status: DC

## 2021-05-25 MED ORDER — KETAMINE HCL 10 MG/ML IJ SOLN
INTRAMUSCULAR | Status: DC | PRN
  Administered 2021-05-25 (×2): 20 mg via INTRAVENOUS

## 2021-05-25 MED ORDER — PROPOFOL 10 MG/ML IV BOLUS
INTRAVENOUS | Status: DC | PRN
  Administered 2021-05-25: 110 mg via INTRAVENOUS

## 2021-05-25 MED ORDER — DEXAMETHASONE SODIUM PHOSPHATE 10 MG/ML IJ SOLN
INTRAMUSCULAR | Status: DC | PRN
  Administered 2021-05-25: 4 mg via INTRAVENOUS

## 2021-05-25 MED ORDER — CHLORHEXIDINE GLUCONATE CLOTH 2 % EX PADS: 6.0000 | MEDICATED_PAD | Freq: Once | CUTANEOUS | Status: DC

## 2021-05-25 MED ORDER — ROCURONIUM BROMIDE 10 MG/ML (PF) SYRINGE
PREFILLED_SYRINGE | INTRAVENOUS | Status: DC | PRN
Start: 2021-05-25 — End: 2021-05-25
  Administered 2021-05-25: 50 mg via INTRAVENOUS
  Administered 2021-05-25 (×2): 20 mg via INTRAVENOUS

## 2021-05-25 MED ORDER — ORAL CARE MOUTH RINSE
15.0000 mL | Freq: Once | OROMUCOSAL | Status: AC
  Administered 2021-05-25: 15 mL via OROMUCOSAL

## 2021-05-25 MED ORDER — OXYCODONE HCL 5 MG PO TABS
5.0000 mg | ORAL_TABLET | ORAL | Status: DC | PRN
  Administered 2021-05-25 – 2021-05-26 (×3): 5 mg via ORAL
  Filled 2021-05-25 (×3): qty 1

## 2021-05-25 MED ORDER — HEPARIN SODIUM (PORCINE) 5000 UNIT/ML IJ SOLN
5000.0000 [IU] | Freq: Three times a day (TID) | INTRAMUSCULAR | Status: DC
  Administered 2021-05-26 – 2021-05-27 (×4): 5000 [IU] via SUBCUTANEOUS
  Filled 2021-05-25 (×4): qty 1

## 2021-05-25 MED ORDER — SODIUM CHLORIDE (PF) 0.9 % IJ SOLN
INTRAMUSCULAR | Status: DC | PRN
  Administered 2021-05-25: 10 mL

## 2021-05-25 MED ORDER — BUPIVACAINE LIPOSOME 1.3 % IJ SUSP
INTRAMUSCULAR | Status: AC
  Filled 2021-05-25: qty 20

## 2021-05-25 MED ORDER — FENTANYL CITRATE PF 50 MCG/ML IJ SOSY: 12.5000 ug | PREFILLED_SYRINGE | INTRAMUSCULAR | Status: DC | PRN

## 2021-05-25 MED ORDER — CHLORHEXIDINE GLUCONATE 0.12 % MT SOLN: 15.0000 mL | Freq: Once | OROMUCOSAL | Status: AC

## 2021-05-25 MED ORDER — ONDANSETRON HCL 4 MG/2ML IJ SOLN
INTRAMUSCULAR | Status: DC | PRN
  Administered 2021-05-25: 4 mg via INTRAVENOUS

## 2021-05-25 MED ORDER — ONDANSETRON HCL 4 MG/2ML IJ SOLN: 4.0000 mg | Freq: Once | INTRAMUSCULAR | Status: DC | PRN

## 2021-05-25 MED ORDER — LIDOCAINE HCL (PF) 2 % IJ SOLN
INTRAMUSCULAR | Status: DC | PRN
  Administered 2021-05-25: 100 mg via INTRADERMAL

## 2021-05-25 SURGICAL SUPPLY — 72 items
ADH SKN CLS APL DERMABOND .7 (GAUZE/BANDAGES/DRESSINGS) ×2
APPLIER CLIP 5 13 M/L LIGAMAX5 (MISCELLANEOUS)
APPLIER CLIP ROT 13.4 12 LRG (CLIP)
APR CLP LRG 13.4X12 ROT 20 MLT (CLIP)
APR CLP MED LRG 5 ANG JAW (MISCELLANEOUS)
BLADE SURG 15 STRL LF DISP TIS (BLADE) ×2 IMPLANT
BLADE SURG 15 STRL SS (BLADE) ×4
BUTTON OLYMPUS DEFENDO 5 PIECE (MISCELLANEOUS) ×4 IMPLANT
CLIP APPLIE 5 13 M/L LIGAMAX5 (MISCELLANEOUS) IMPLANT
CLIP APPLIE ROT 13.4 12 LRG (CLIP) IMPLANT
COVER SURGICAL LIGHT HANDLE (MISCELLANEOUS) ×4 IMPLANT
COVER TIP SHEARS 8 DVNC (MISCELLANEOUS) ×2 IMPLANT
COVER TIP SHEARS 8MM DA VINCI (MISCELLANEOUS) ×4
DECANTER SPIKE VIAL GLASS SM (MISCELLANEOUS) ×4 IMPLANT
DERMABOND ADVANCED (GAUZE/BANDAGES/DRESSINGS) ×2
DERMABOND ADVANCED .7 DNX12 (GAUZE/BANDAGES/DRESSINGS) ×2 IMPLANT
DEVICE TROCAR PUNCTURE CLOSURE (ENDOMECHANICALS) IMPLANT
DRAIN PENROSE 0.5X18 (DRAIN) ×4 IMPLANT
DRAPE ARM DVNC X/XI (DISPOSABLE) ×8 IMPLANT
DRAPE COLUMN DVNC XI (DISPOSABLE) ×2 IMPLANT
DRAPE DA VINCI XI ARM (DISPOSABLE) ×16
DRAPE DA VINCI XI COLUMN (DISPOSABLE) ×4
ELECT REM PT RETURN 15FT ADLT (MISCELLANEOUS) ×4 IMPLANT
GAUZE 4X4 16PLY ~~LOC~~+RFID DBL (SPONGE) ×4 IMPLANT
GLOVE SURG ENC MOIS LTX SZ6.5 (GLOVE) ×4 IMPLANT
GLOVE SURG ENC MOIS LTX SZ7 (GLOVE) ×4 IMPLANT
GLOVE SURG ENC TEXT LTX SZ8 (GLOVE) ×8 IMPLANT
GLOVE SURG POLY ORTHO LF SZ7.5 (GLOVE) ×4 IMPLANT
GLOVE SURG UNDER POLY LF SZ6.5 (GLOVE) ×4 IMPLANT
GLOVE SURG UNDER POLY LF SZ7 (GLOVE) ×4 IMPLANT
GOWN STRL REUS W/TWL LRG LVL3 (GOWN DISPOSABLE) ×8 IMPLANT
GOWN STRL REUS W/TWL XL LVL3 (GOWN DISPOSABLE) ×4 IMPLANT
GRASPER SUT TROCAR 14GX15 (MISCELLANEOUS) IMPLANT
IRRIG SUCT STRYKERFLOW 2 WTIP (MISCELLANEOUS) ×4
IRRIGATION SUCT STRKRFLW 2 WTP (MISCELLANEOUS) ×2 IMPLANT
IV LACTATED RINGERS 1000ML (IV SOLUTION) ×4 IMPLANT
KIT BASIN OR (CUSTOM PROCEDURE TRAY) ×4 IMPLANT
KIT TURNOVER KIT A (KITS) IMPLANT
MARKER SKIN DUAL TIP RULER LAB (MISCELLANEOUS) ×4 IMPLANT
NEEDLE HYPO 22GX1.5 SAFETY (NEEDLE) ×4 IMPLANT
NS IRRIG 1000ML POUR BTL (IV SOLUTION) ×4 IMPLANT
OBTURATOR OPTICAL STANDARD 8MM (TROCAR) ×4
OBTURATOR OPTICAL STND 8 DVNC (TROCAR) ×2
OBTURATOR OPTICALSTD 8 DVNC (TROCAR) ×2 IMPLANT
PACK CARDIOVASCULAR III (CUSTOM PROCEDURE TRAY) ×4 IMPLANT
PAD POSITIONING PINK XL (MISCELLANEOUS) IMPLANT
SCISSORS LAP 5X45 EPIX DISP (ENDOMECHANICALS) IMPLANT
SEAL CANN UNIV 5-8 DVNC XI (MISCELLANEOUS) ×8 IMPLANT
SEAL XI 5MM-8MM UNIVERSAL (MISCELLANEOUS) ×16
SEALER VESSEL DA VINCI XI (MISCELLANEOUS) ×4
SEALER VESSEL EXT DVNC XI (MISCELLANEOUS) ×2 IMPLANT
SOL ANTI FOG 6CC (MISCELLANEOUS) ×2 IMPLANT
SOLUTION ANTI FOG 6CC (MISCELLANEOUS) ×2
SOLUTION ELECTROLUBE (MISCELLANEOUS) ×4 IMPLANT
SUT ETHIBOND 0 36 GRN (SUTURE) ×16 IMPLANT
SUT MNCRL AB 4-0 PS2 18 (SUTURE) ×8 IMPLANT
SUT VICRYL 0 UR6 27IN ABS (SUTURE) IMPLANT
SYR 10ML ECCENTRIC (SYRINGE) ×4 IMPLANT
SYR 20ML LL LF (SYRINGE) ×4 IMPLANT
TIP INNERVISION DETACH 40FR (MISCELLANEOUS) IMPLANT
TIP INNERVISION DETACH 50FR (MISCELLANEOUS) IMPLANT
TIP INNERVISION DETACH 56FR (MISCELLANEOUS) IMPLANT
TIPS INNERVISION DETACH 40FR (MISCELLANEOUS)
TOWEL OR 17X26 10 PK STRL BLUE (TOWEL DISPOSABLE) ×4 IMPLANT
TRAY FOLEY MTR SLVR 16FR STAT (SET/KITS/TRAYS/PACK) IMPLANT
TROCAR ADV FIXATION 12X100MM (TROCAR) ×4 IMPLANT
TROCAR ADV FIXATION 5X100MM (TROCAR) IMPLANT
TROCAR BLADELESS OPT 5 100 (ENDOMECHANICALS) ×4 IMPLANT
TUBING CONNECTING 10 (TUBING) ×3 IMPLANT
TUBING CONNECTING 10' (TUBING) ×1
TUBING ENDO SMARTCAP (MISCELLANEOUS) ×4 IMPLANT
TUBING INSUFFLATION 10FT LAP (TUBING) ×4 IMPLANT

## 2021-05-25 NOTE — Interval H&P Note (Signed)
History and Physical Interval Note:  05/25/2021 12:54 PM  Mckenzie Guerrero  has presented today for surgery, with the diagnosis of LARGE TYPE III HIATAL HERNIA.  The various methods of treatment have been discussed with the patient and family. After consideration of risks, benefits and other options for treatment, the patient has consented to  Procedure(s): XI ROBOTIC ASSISTED HIATAL HERNIA REPAIR WITH FUNDOPLICATION (N/A) as a surgical intervention.  The patient's history has been reviewed, patient examined, no change in status, stable for surgery.  I have reviewed the patient's chart and labs.  Questions were answered to the patient's satisfaction.     Pedro Earls

## 2021-05-25 NOTE — Transfer of Care (Signed)
Immediate Anesthesia Transfer of Care Note  Patient: Mckenzie Guerrero  Procedure(s) Performed: Procedure(s): XI ROBOTIC ASSISTED HIATAL HERNIA REPAIR WITH FUNDOPLICATION (N/A) ESOPHAGOGASTRODUODENOSCOPY (EGD) (N/A)  Patient Location: PACU  Anesthesia Type:General  Level of Consciousness: Alert, Awake, Oriented  Airway & Oxygen Therapy: Patient Spontanous Breathing  Post-op Assessment: Report given to RN  Post vital signs: Reviewed and stable  Last Vitals: There were no vitals filed for this visit.  Complications: No apparent anesthesia complications

## 2021-05-25 NOTE — Anesthesia Procedure Notes (Signed)
Procedure Name: Intubation Date/Time: 05/25/2021 2:01 PM Performed by: Milford Cage, CRNA Pre-anesthesia Checklist: Patient identified, Emergency Drugs available, Suction available and Patient being monitored Patient Re-evaluated:Patient Re-evaluated prior to induction Oxygen Delivery Method: Circle system utilized Preoxygenation: Pre-oxygenation with 100% oxygen Induction Type: IV induction, Rapid sequence and Cricoid Pressure applied Laryngoscope Size: Miller and 2 Grade View: Grade I Tube type: Oral Tube size: 7.0 mm Number of attempts: 1 Airway Equipment and Method: Stylet Placement Confirmation: ETT inserted through vocal cords under direct vision, positive ETCO2 and breath sounds checked- equal and bilateral Secured at: 21 cm Tube secured with: Tape Dental Injury: Teeth and Oropharynx as per pre-operative assessment

## 2021-05-25 NOTE — Anesthesia Postprocedure Evaluation (Signed)
Anesthesia Post Note  Patient: Mckenzie Guerrero  Procedure(s) Performed: XI ROBOTIC ASSISTED HIATAL HERNIA REPAIR WITH FUNDOPLICATION (Abdomen) ESOPHAGOGASTRODUODENOSCOPY (EGD) (Esophagus)     Patient location during evaluation: PACU Anesthesia Type: General Level of consciousness: awake and alert Pain management: pain level controlled Vital Signs Assessment: post-procedure vital signs reviewed and stable Respiratory status: spontaneous breathing, nonlabored ventilation, respiratory function stable and patient connected to nasal cannula oxygen Cardiovascular status: blood pressure returned to baseline and stable Postop Assessment: no apparent nausea or vomiting Anesthetic complications: no   No notable events documented.  Last Vitals:  Vitals:   05/25/21 1830 05/25/21 1845  BP: 123/64 127/71  Pulse: 83 70  Resp: 16 14  Temp:    SpO2: 96% 91%    Last Pain: There were no vitals filed for this visit.               Willow Reczek S

## 2021-05-25 NOTE — Anesthesia Preprocedure Evaluation (Signed)
Anesthesia Evaluation  Patient identified by MRN, date of birth, ID band Patient awake    Reviewed: Allergy & Precautions, NPO status , Patient's Chart, lab work & pertinent test results  History of Anesthesia Complications (+) PONV  Airway Mallampati: II  TM Distance: >3 FB Neck ROM: Full    Dental no notable dental hx.    Pulmonary neg pulmonary ROS, former smoker,    Pulmonary exam normal breath sounds clear to auscultation       Cardiovascular hypertension, Pt. on medications Normal cardiovascular exam Rhythm:Regular Rate:Normal     Neuro/Psych negative neurological ROS  negative psych ROS   GI/Hepatic Neg liver ROS, GERD  Medicated,  Endo/Other  negative endocrine ROS  Renal/GU negative Renal ROS  negative genitourinary   Musculoskeletal  (+) Arthritis , Osteoarthritis,    Abdominal   Peds negative pediatric ROS (+)  Hematology negative hematology ROS (+)   Anesthesia Other Findings   Reproductive/Obstetrics negative OB ROS                             Anesthesia Physical Anesthesia Plan  ASA: 2  Anesthesia Plan: General   Post-op Pain Management:    Induction: Intravenous  PONV Risk Score and Plan: 3 and Ondansetron, Dexamethasone, Droperidol and Treatment may vary due to age or medical condition  Airway Management Planned: Oral ETT  Additional Equipment:   Intra-op Plan:   Post-operative Plan: Extubation in OR  Informed Consent: I have reviewed the patients History and Physical, chart, labs and discussed the procedure including the risks, benefits and alternatives for the proposed anesthesia with the patient or authorized representative who has indicated his/her understanding and acceptance.     Dental advisory given  Plan Discussed with: CRNA and Surgeon  Anesthesia Plan Comments:         Anesthesia Quick Evaluation

## 2021-05-25 NOTE — Op Note (Signed)
CAYDEN RAUTIO  1950-09-21   05/25/2021    PCP:  Raina Mina., MD   Surgeon: Kaylyn Lim, MD, FACS  Asst:  Gurney Maxin, MD, FACS  Anes:  general  Preop Dx: Large type III mixed hiatal hernia Postop Dx: same  Procedure: Xi robotic takedown and reduction of hiatal hernia; endoscopy x 2, Nissen fundoplication over a 28 Fr bougie Location Surgery: [facility] OR 2 Complications: None noted  EBL:   30 cc  Drains: none  Description of Procedure:  The patient was taken to OR 2 .  After anesthesia was administered and the patient was prepped  with chloroprep  and a timeout was performed.  Access to the abdomen was achieved with a 5 mm Optiview through the left upper quadrant.  I then put in a tap block on both sides with a total of 30 cc of Exparel diluted by 10 cc's with saline.  The 4 robotic trocar trochars were placed approximately 8 cm away from each other transversely and an assist port was placed on the right side which was a 10 and then of a placed the Nathanson retractor initially and upon docking found that there was clotting with the medium liver retractor.  I went ahead and removed it and placed the small Susan Moore and this gave the profile low enough height so that the robot could be successfully docked.  Following docking of the scope we could not get it to target but went ahead and docked the remaining arms.  The robot was brought in from the patient's right side.  The instruments included a fenestrated bipolar on the far right the camera the vessel sealer and then a tip up on the far left.  Dr. Kieth Brightly had Prestige grasper.  Patient had an extremely large type III mixed hiatal hernia.  It was large on both the right and particularly the side where the hernia went up into the right chest.  The right gastric was actually pulled up into the hernia.  I went ahead and began removing the sac which turned out to be very thin and stringy and did not just stripped out so easily.  However we  got this down and then mobilized the esophagus laterally posteriorly to get length.  I passed a Penrose around.  The opening of the hiatus was more transverse than vertical.  I elected to close that with a series of figure-of-eight sutures using the 0 Ethibond with 3 figure-of-eight's and then 2 samples with the last one placed after I had passed the 56 bougie.  I had the 50s that 6 bougie and pulled it back in the esophagus.  Prior to doing this closure I did pass the endoscope to verify the anatomy and make sure that the esophagus was at length which it was.  I will add a Penrose around the the esophagogastric junction and after doing several maneuvers came around from the patient's right side with the tip up and grasp a portion of the stomach and brought it around and then created a Nissen fundoplication with 3 sutures over the 56 lighted dilator.  When this was completed it was removed.  I passed the endoscope a second time to check for any evidence of perforations and leaks and an and looked at where the wrap was not appear to be right of the esophagogastric junction.  The robot was undocked.  The trochars were removed and the skin was closed with 4 Monocryl and Dermabond  The patient tolerated  the procedure well and was taken to the PACU in stable condition.     Matt B. Hassell Done, [place], [facility] Surgery, [place]

## 2021-05-26 ENCOUNTER — Encounter (HOSPITAL_COMMUNITY): Payer: Self-pay | Admitting: Surgery

## 2021-05-26 ENCOUNTER — Other Ambulatory Visit: Payer: Self-pay

## 2021-05-26 LAB — CBC
HCT: 37.7 % (ref 36.0–46.0)
Hemoglobin: 12.5 g/dL (ref 12.0–15.0)
MCH: 30.9 pg (ref 26.0–34.0)
MCHC: 33.2 g/dL (ref 30.0–36.0)
MCV: 93.1 fL (ref 80.0–100.0)
Platelets: 316 10*3/uL (ref 150–400)
RBC: 4.05 MIL/uL (ref 3.87–5.11)
RDW: 13 % (ref 11.5–15.5)
WBC: 16.7 10*3/uL — ABNORMAL HIGH (ref 4.0–10.5)
nRBC: 0 % (ref 0.0–0.2)

## 2021-05-26 MED ORDER — ACETAMINOPHEN 500 MG PO TABS: 1000.0000 mg | ORAL_TABLET | Freq: Four times a day (QID) | ORAL | Status: DC | PRN

## 2021-05-26 MED ORDER — PANTOPRAZOLE SODIUM 40 MG PO TBEC
40.0000 mg | DELAYED_RELEASE_TABLET | Freq: Every day | ORAL | Status: DC
  Administered 2021-05-26: 40 mg via ORAL
  Filled 2021-05-26: qty 1

## 2021-05-26 MED ORDER — ACETAMINOPHEN 325 MG PO TABS
650.0000 mg | ORAL_TABLET | Freq: Four times a day (QID) | ORAL | Status: DC | PRN
  Administered 2021-05-26 – 2021-05-27 (×2): 650 mg via ORAL
  Filled 2021-05-26 (×2): qty 2

## 2021-05-26 NOTE — Progress Notes (Signed)
Patient ID: Mckenzie Guerrero, female   DOB: [birth year], 70 y.o.   MRN: [id number] [facility] Surgery Progress Note:   1 Day Post-Op  Subjective: Mental status is clear .  Complaints shoulder pain. Objective: Vital signs in last 24 hours: Temp:  [97.3 F (36.3 C)-99.1 F (37.3 C)] 98.4 F (36.9 C) (11/15 1000) Pulse Rate:  [64-83] 65 (11/15 1000) Resp:  [14-18] 18 (11/15 1000) BP: (123-155)/(64-89) 145/80 (11/15 1000) SpO2:  [94 %-100 %] 98 % (11/15 1000) Weight:  [68.3 kg] 68.3 kg (11/14 2040)  Intake/Output from previous day: 11/14 0701 - 11/15 0700 In: 2031.7 [I.V.:1831.7; IV Piggyback:200] Out: 1270 [Urine:1250; Blood:20] Intake/Output this shift: Total I/O In: 627.5 [P.O.:240; I.V.:387.5] Out: 200 [Urine:200]  Physical Exam: Work of breathing is normal.  Incisions OK.  Advancing diet to full liquids and hopeful discharge tomorrow  Lab Results:  Results for orders placed or performed during the hospital encounter of 05/25/21 (from the past 48 hour(s))  CBC     Status: Abnormal   Collection Time: 05/25/21  8:28 PM  Result Value Ref Range   WBC 23.3 (H) 4.0 - 10.5 K/uL   RBC 4.34 3.87 - 5.11 MIL/uL   Hemoglobin 13.2 12.0 - 15.0 g/dL   HCT 40.3 36.0 - 46.0 %   MCV 92.9 80.0 - 100.0 fL   MCH 30.4 26.0 - 34.0 pg   MCHC 32.8 30.0 - 36.0 g/dL   RDW 12.9 11.5 - 15.5 %   Platelets 362 150 - 400 K/uL   nRBC 0.0 0.0 - 0.2 %    Comment: Performed at [facility], [place] [address] [place], [place] [address]  Creatinine, serum     Status: None   Collection Time: 05/25/21  8:28 PM  Result Value Ref Range   Creatinine, Ser 0.79 0.44 - 1.00 mg/dL   GFR, Estimated >60 >60 mL/min    Comment: (NOTE) Calculated using the CKD-EPI Creatinine Equation (2021) Performed at [facility], [place] [address] [place], [place] [address]   CBC     Status: Abnormal   Collection Time: 05/26/21  4:22 AM  Result Value Ref Range   WBC 16.7 (H) 4.0 - 10.5  K/uL   RBC 4.05 3.87 - 5.11 MIL/uL   Hemoglobin 12.5 12.0 - 15.0 g/dL   HCT 37.7 36.0 - 46.0 %   MCV 93.1 80.0 - 100.0 fL   MCH 30.9 26.0 - 34.0 pg   MCHC 33.2 30.0 - 36.0 g/dL   RDW 13.0 11.5 - 15.5 %   Platelets 316 150 - 400 K/uL   nRBC 0.0 0.0 - 0.2 %    Comment: Performed at [facility], [place] [address] [place], [place] [address]    Radiology/Results: No results found.  Anti-infectives: Anti-infectives (From admission, onward)    Start     Dose/Rate Route Frequency Ordered Stop   05/26/21 0200  cefoTEtan (CEFOTAN) 2 g in sodium chloride 0.9 % 100 mL IVPB        2 g 200 mL/hr over 30 Minutes Intravenous Every 12 hours 05/25/21 2000 05/26/21 0209   05/25/21 1200  cefoTEtan (CEFOTAN) 2 g in sodium chloride 0.9 % 100 mL IVPB        2 g 200 mL/hr over 30 Minutes Intravenous On call to O.R. 05/25/21 1153 05/25/21 1429       Assessment/Plan: Problem List: Patient Active Problem List   Diagnosis Date Noted   Status post laparoscopic Nissen fundoplication  05/25/2021   Cervical spondylosis with myelopathy and radiculopathy 09/28/2019    Doing well.  Not adequate po intake today for discharge.   1 Day Post-Op    LOS: 1 day   Matt B. Hassell Done, MD, [facility] [phone] to reach the surgeon on call.    05/26/2021 1:49 PM

## 2021-05-27 MED ORDER — HYDROCODONE-ACETAMINOPHEN 5-325 MG PO TABS: 1.0000 | ORAL_TABLET | Freq: Four times a day (QID) | ORAL | 0 refills | Status: DC | PRN

## 2021-05-27 MED ORDER — ONDANSETRON 4 MG PO TBDP: 4.0000 mg | ORAL_TABLET | Freq: Three times a day (TID) | ORAL | 0 refills | Status: DC | PRN

## 2021-05-27 NOTE — Discharge Summary (Signed)
Physician Discharge Summary  Patient ID: Mckenzie Guerrero MRN: [id number] DOB/AGE: [birth year] 70 y.o.  PCP: Raina Mina., MD  Admit date: 05/25/2021 Discharge date: 05/27/2021  Admission Diagnoses:  hiatal hernia and GER  Discharge Diagnoses:  same  Active Problems:   Status post laparoscopic Nissen fundoplication   Surgery:  Xi robotic repair of large type III mixed hiatal hernia with Nissen fundoplication  Discharged Condition: improved  Hospital Course:   Had surgery on Monday afternoon and begun on clears and advanced to full liquids.  Ready for discharge on Wednesday.    Consults: none  Significant Diagnostic Studies: none    Discharge Exam: Blood pressure (!) 141/80, pulse 64, temperature 98.9 F (37.2 C), temperature source Oral, resp. rate 18, height 5\' 5"  (1.651 m), weight 68.3 kg, SpO2 93 %. Incisions healing OK  Disposition: Discharge disposition: 01-Home or Self Care       Discharge Instructions     Call MD for:  redness, tenderness, or signs of infection (pain, swelling, redness, odor or green/yellow discharge around incision site)   Complete by: As directed    Diet full liquid   Complete by: As directed    Full liquids for one week then pureed foods for three weeks.   Discharge instructions   Complete by: As directed    You may shower when you get home.  The Dermabond will peel off   Increase activity slowly   Complete by: As directed       Allergies as of 05/27/2021       Reactions   Codeine Hives, Itching   Doxycycline Nausea And Vomiting   Nsaids Other (See Comments)   Gi bleeding   Sulfonamide Derivatives Swelling   Prednisone Anxiety        Medication List     STOP taking these medications    ALPRAZolam 0.25 MG tablet Commonly known as: XANAX   omeprazole 20 MG capsule Commonly known as: PRILOSEC   ramipril 5 MG capsule Commonly known as: ALTACE       TAKE these medications    acetaminophen 500 MG  tablet Commonly known as: TYLENOL Take 1,000 mg by mouth 2 (two) times daily. Rapid release   ASCORBIC ACID PO Take 1 mg by mouth daily. Gummy   cholecalciferol 25 MCG (1000 UNIT) tablet Commonly known as: VITAMIN D3 Take 1,000 Units by mouth daily.   diclofenac Sodium 1 % Gel Commonly known as: VOLTAREN Apply 2 g topically daily as needed (pain on Knees and hands).   HYDROcodone-acetaminophen 5-325 MG tablet Commonly known as: NORCO/VICODIN Take 1 tablet by mouth every 6 (six) hours as needed for moderate pain.   loratadine 10 MG tablet Commonly known as: CLARITIN Take 10 mg by mouth daily.   multivitamin with minerals Tabs tablet Take 1 tablet by mouth daily. Centrum Silver Woman   ondansetron 4 MG disintegrating tablet Commonly known as: Zofran ODT Take 1 tablet (4 mg total) by mouth every 8 (eight) hours as needed for nausea or vomiting.   Probiotic Caps Take 1 capsule by mouth daily. renew life probiotics   Sinus Pressure + Pain 5-325 MG Tabs Generic drug: Phenylephrine-Acetaminophen Take 1-2 tablets by mouth daily as needed (congestion).   sodium chloride 0.65 % Soln nasal spray Commonly known as: OCEAN Place 1 spray into both nostrils daily as needed for congestion (allergies). Simple saline   SUMAtriptan 50 MG tablet Commonly known as: IMITREX Take 50 mg by mouth every 2 (two) hours as  needed for migraine.   SYSTANE OP Place 1 drop into both eyes daily as needed (dry eyes).        Follow-up Information     Johnathan Hausen, MD Follow up.   Specialty: General Surgery Why: For routine follow up with Dr. Carter Kitten information: [address] [place] [address] [place] [id number] [phone]                 Signed: Pedro Earls 05/27/2021, 6:50 AM

## 2021-05-27 NOTE — Plan of Care (Signed)

## 2021-06-08 ENCOUNTER — Emergency Department (HOSPITAL_COMMUNITY): Payer: PPO

## 2021-06-08 ENCOUNTER — Inpatient Hospital Stay (HOSPITAL_COMMUNITY)
Admission: EM | Admit: 2021-06-08 | Discharge: 2021-06-13 | DRG: 064 | Disposition: A | Discharge status: Rehabilitation Facility | Payer: PPO | Attending: Internal Medicine | Admitting: Internal Medicine

## 2021-06-08 ENCOUNTER — Inpatient Hospital Stay (HOSPITAL_COMMUNITY): Payer: PPO

## 2021-06-08 DIAGNOSIS — I63412 Cerebral infarction due to embolism of left middle cerebral artery: Secondary | ICD-10-CM | POA: Diagnosis not present

## 2021-06-08 DIAGNOSIS — I4891 Unspecified atrial fibrillation: Secondary | ICD-10-CM | POA: Diagnosis present

## 2021-06-08 DIAGNOSIS — G8191 Hemiplegia, unspecified affecting right dominant side: Secondary | ICD-10-CM | POA: Diagnosis not present

## 2021-06-08 DIAGNOSIS — K219 Gastro-esophageal reflux disease without esophagitis: Secondary | ICD-10-CM | POA: Diagnosis present

## 2021-06-08 DIAGNOSIS — M4722 Other spondylosis with radiculopathy, cervical region: Secondary | ICD-10-CM | POA: Diagnosis present

## 2021-06-08 DIAGNOSIS — Z981 Arthrodesis status: Secondary | ICD-10-CM

## 2021-06-08 DIAGNOSIS — Z882 Allergy status to sulfonamides status: Secondary | ICD-10-CM | POA: Diagnosis not present

## 2021-06-08 DIAGNOSIS — Z87891 Personal history of nicotine dependence: Secondary | ICD-10-CM

## 2021-06-08 DIAGNOSIS — I10 Essential (primary) hypertension: Secondary | ICD-10-CM | POA: Diagnosis not present

## 2021-06-08 DIAGNOSIS — K5909 Other constipation: Secondary | ICD-10-CM | POA: Diagnosis present

## 2021-06-08 DIAGNOSIS — Z885 Allergy status to narcotic agent status: Secondary | ICD-10-CM

## 2021-06-08 DIAGNOSIS — Z881 Allergy status to other antibiotic agents status: Secondary | ICD-10-CM

## 2021-06-08 DIAGNOSIS — Z8669 Personal history of other diseases of the nervous system and sense organs: Secondary | ICD-10-CM

## 2021-06-08 DIAGNOSIS — K449 Diaphragmatic hernia without obstruction or gangrene: Secondary | ICD-10-CM | POA: Diagnosis not present

## 2021-06-08 DIAGNOSIS — F419 Anxiety disorder, unspecified: Secondary | ICD-10-CM | POA: Diagnosis present

## 2021-06-08 DIAGNOSIS — R32 Unspecified urinary incontinence: Secondary | ICD-10-CM | POA: Diagnosis present

## 2021-06-08 DIAGNOSIS — R4701 Aphasia: Secondary | ICD-10-CM | POA: Diagnosis not present

## 2021-06-08 DIAGNOSIS — Z8719 Personal history of other diseases of the digestive system: Secondary | ICD-10-CM | POA: Diagnosis not present

## 2021-06-08 DIAGNOSIS — R29818 Other symptoms and signs involving the nervous system: Secondary | ICD-10-CM | POA: Diagnosis not present

## 2021-06-08 DIAGNOSIS — I63312 Cerebral infarction due to thrombosis of left middle cerebral artery: Secondary | ICD-10-CM | POA: Diagnosis not present

## 2021-06-08 DIAGNOSIS — I69391 Dysphagia following cerebral infarction: Secondary | ICD-10-CM | POA: Diagnosis not present

## 2021-06-08 DIAGNOSIS — M545 Low back pain, unspecified: Secondary | ICD-10-CM | POA: Diagnosis present

## 2021-06-08 DIAGNOSIS — R220 Localized swelling, mass and lump, head: Secondary | ICD-10-CM | POA: Diagnosis not present

## 2021-06-08 DIAGNOSIS — G319 Degenerative disease of nervous system, unspecified: Secondary | ICD-10-CM | POA: Diagnosis not present

## 2021-06-08 DIAGNOSIS — Z79899 Other long term (current) drug therapy: Secondary | ICD-10-CM

## 2021-06-08 DIAGNOSIS — Z515 Encounter for palliative care: Secondary | ICD-10-CM | POA: Diagnosis not present

## 2021-06-08 DIAGNOSIS — E7800 Pure hypercholesterolemia, unspecified: Secondary | ICD-10-CM | POA: Diagnosis not present

## 2021-06-08 DIAGNOSIS — I639 Cerebral infarction, unspecified: Principal | ICD-10-CM | POA: Diagnosis present

## 2021-06-08 DIAGNOSIS — I6501 Occlusion and stenosis of right vertebral artery: Secondary | ICD-10-CM | POA: Diagnosis not present

## 2021-06-08 DIAGNOSIS — R339 Retention of urine, unspecified: Secondary | ICD-10-CM

## 2021-06-08 DIAGNOSIS — E871 Hypo-osmolality and hyponatremia: Secondary | ICD-10-CM

## 2021-06-08 DIAGNOSIS — Z886 Allergy status to analgesic agent status: Secondary | ICD-10-CM

## 2021-06-08 DIAGNOSIS — R7303 Prediabetes: Secondary | ICD-10-CM | POA: Diagnosis present

## 2021-06-08 DIAGNOSIS — I6389 Other cerebral infarction: Secondary | ICD-10-CM | POA: Diagnosis not present

## 2021-06-08 DIAGNOSIS — E785 Hyperlipidemia, unspecified: Secondary | ICD-10-CM

## 2021-06-08 DIAGNOSIS — I480 Paroxysmal atrial fibrillation: Secondary | ICD-10-CM | POA: Diagnosis not present

## 2021-06-08 DIAGNOSIS — R29810 Facial weakness: Secondary | ICD-10-CM | POA: Diagnosis present

## 2021-06-08 DIAGNOSIS — I619 Nontraumatic intracerebral hemorrhage, unspecified: Secondary | ICD-10-CM | POA: Diagnosis present

## 2021-06-08 DIAGNOSIS — I739 Peripheral vascular disease, unspecified: Secondary | ICD-10-CM | POA: Diagnosis present

## 2021-06-08 DIAGNOSIS — R29724 NIHSS score 24: Secondary | ICD-10-CM | POA: Diagnosis present

## 2021-06-08 DIAGNOSIS — D72829 Elevated white blood cell count, unspecified: Secondary | ICD-10-CM | POA: Diagnosis not present

## 2021-06-08 DIAGNOSIS — I69320 Aphasia following cerebral infarction: Secondary | ICD-10-CM | POA: Diagnosis not present

## 2021-06-08 DIAGNOSIS — R1311 Dysphagia, oral phase: Secondary | ICD-10-CM | POA: Diagnosis not present

## 2021-06-08 DIAGNOSIS — G936 Cerebral edema: Secondary | ICD-10-CM | POA: Diagnosis present

## 2021-06-08 DIAGNOSIS — I69351 Hemiplegia and hemiparesis following cerebral infarction affecting right dominant side: Secondary | ICD-10-CM | POA: Diagnosis present

## 2021-06-08 DIAGNOSIS — I69392 Facial weakness following cerebral infarction: Secondary | ICD-10-CM | POA: Diagnosis not present

## 2021-06-08 DIAGNOSIS — Z20822 Contact with and (suspected) exposure to covid-19: Secondary | ICD-10-CM | POA: Diagnosis not present

## 2021-06-08 DIAGNOSIS — R1310 Dysphagia, unspecified: Secondary | ICD-10-CM | POA: Diagnosis present

## 2021-06-08 DIAGNOSIS — G43909 Migraine, unspecified, not intractable, without status migrainosus: Secondary | ICD-10-CM | POA: Diagnosis present

## 2021-06-08 DIAGNOSIS — I63512 Cerebral infarction due to unspecified occlusion or stenosis of left middle cerebral artery: Secondary | ICD-10-CM | POA: Diagnosis not present

## 2021-06-08 DIAGNOSIS — D75839 Thrombocytosis, unspecified: Secondary | ICD-10-CM

## 2021-06-08 DIAGNOSIS — Z9889 Other specified postprocedural states: Secondary | ICD-10-CM

## 2021-06-08 DIAGNOSIS — Z888 Allergy status to other drugs, medicaments and biological substances status: Secondary | ICD-10-CM

## 2021-06-08 DIAGNOSIS — I4892 Unspecified atrial flutter: Secondary | ICD-10-CM | POA: Diagnosis not present

## 2021-06-08 DIAGNOSIS — R451 Restlessness and agitation: Secondary | ICD-10-CM | POA: Diagnosis present

## 2021-06-08 DIAGNOSIS — D75838 Other thrombocytosis: Secondary | ICD-10-CM | POA: Diagnosis present

## 2021-06-08 DIAGNOSIS — F802 Mixed receptive-expressive language disorder: Secondary | ICD-10-CM | POA: Diagnosis present

## 2021-06-08 DIAGNOSIS — M4712 Other spondylosis with myelopathy, cervical region: Secondary | ICD-10-CM | POA: Diagnosis present

## 2021-06-08 LAB — DIFFERENTIAL
Abs Immature Granulocytes: 0.06 10*3/uL (ref 0.00–0.07)
Basophils Absolute: 0.1 10*3/uL (ref 0.0–0.1)
Basophils Relative: 1 %
Eosinophils Absolute: 0 10*3/uL (ref 0.0–0.5)
Eosinophils Relative: 0 %
Immature Granulocytes: 0 %
Lymphocytes Relative: 11 %
Lymphs Abs: 1.7 10*3/uL (ref 0.7–4.0)
Monocytes Absolute: 1 10*3/uL (ref 0.1–1.0)
Monocytes Relative: 7 %
Neutro Abs: 12.5 10*3/uL — ABNORMAL HIGH (ref 1.7–7.7)
Neutrophils Relative %: 81 %

## 2021-06-08 LAB — COMPREHENSIVE METABOLIC PANEL
ALT: 20 U/L (ref 0–44)
AST: 21 U/L (ref 15–41)
Albumin: 3.5 g/dL (ref 3.5–5.0)
Alkaline Phosphatase: 77 U/L (ref 38–126)
Anion gap: 8 (ref 5–15)
BUN: 10 mg/dL (ref 8–23)
CO2: 23 mmol/L (ref 22–32)
Calcium: 8.7 mg/dL — ABNORMAL LOW (ref 8.9–10.3)
Chloride: 105 mmol/L (ref 98–111)
Creatinine, Ser: 0.61 mg/dL (ref 0.44–1.00)
GFR, Estimated: >60 mL/min (ref >60)
Glucose, Bld: 125 mg/dL — ABNORMAL HIGH (ref 70–99)
Potassium: 3.5 mmol/L (ref 3.5–5.1)
Sodium: 136 mmol/L (ref 135–145)
Total Bilirubin: 0.5 mg/dL (ref 0.3–1.2)
Total Protein: 6.3 g/dL — ABNORMAL LOW (ref 6.5–8.1)

## 2021-06-08 LAB — CBC
HCT: 39.9 % (ref 36.0–46.0)
Hemoglobin: 13 g/dL (ref 12.0–15.0)
MCH: 30.3 pg (ref 26.0–34.0)
MCHC: 32.6 g/dL (ref 30.0–36.0)
MCV: 93 fL (ref 80.0–100.0)
Platelets: 442 10*3/uL — ABNORMAL HIGH (ref 150–400)
RBC: 4.29 MIL/uL (ref 3.87–5.11)
RDW: 12.5 % (ref 11.5–15.5)
WBC: 15.4 10*3/uL — ABNORMAL HIGH (ref 4.0–10.5)
nRBC: 0 % (ref 0.0–0.2)

## 2021-06-08 LAB — I-STAT CHEM 8, ED
BUN: 10 mg/dL (ref 8–23)
Calcium, Ion: 1.05 mmol/L — ABNORMAL LOW (ref 1.15–1.40)
Chloride: 107 mmol/L (ref 98–111)
Creatinine, Ser: 0.5 mg/dL (ref 0.44–1.00)
Glucose, Bld: 121 mg/dL — ABNORMAL HIGH (ref 70–99)
HCT: 39 % (ref 36.0–46.0)
Hemoglobin: 13.3 g/dL (ref 12.0–15.0)
Potassium: 3.6 mmol/L (ref 3.5–5.1)
Sodium: 138 mmol/L (ref 135–145)
TCO2: 22 mmol/L (ref 22–32)

## 2021-06-08 LAB — URINALYSIS, ROUTINE W REFLEX MICROSCOPIC
Bilirubin Urine: NEGATIVE
Glucose, UA: NEGATIVE mg/dL
Hgb urine dipstick: NEGATIVE
Ketones, ur: 5 mg/dL — AB
Leukocytes,Ua: NEGATIVE
Nitrite: NEGATIVE
Protein, ur: NEGATIVE mg/dL
Specific Gravity, Urine: >1.046 — ABNORMAL HIGH (ref 1.005–1.030)
pH: 9 — ABNORMAL HIGH (ref 5.0–8.0)

## 2021-06-08 LAB — PROTIME-INR
INR: 1.1 (ref 0.8–1.2)
Prothrombin Time: 13.9 seconds (ref 11.4–15.2)

## 2021-06-08 LAB — APTT: aPTT: 24 seconds (ref 24–36)

## 2021-06-08 LAB — CBG MONITORING, ED: Glucose-Capillary: 122 mg/dL — ABNORMAL HIGH (ref 70–99)

## 2021-06-08 MED ORDER — LABETALOL HCL 5 MG/ML IV SOLN
5.0000 mg | INTRAVENOUS | Status: DC | PRN
  Filled 2021-06-08: qty 4

## 2021-06-08 MED ORDER — LABETALOL HCL 5 MG/ML IV SOLN: 5.0000 mg | INTRAVENOUS | Status: DC | PRN

## 2021-06-08 MED ORDER — ASPIRIN EC 81 MG PO TBEC: 81.0000 mg | DELAYED_RELEASE_TABLET | Freq: Every day | ORAL | Status: DC

## 2021-06-08 MED ORDER — ACETAMINOPHEN 650 MG RE SUPP: 650.0000 mg | RECTAL | Status: DC | PRN

## 2021-06-08 MED ORDER — ACETAMINOPHEN 160 MG/5ML PO SOLN: 650.0000 mg | ORAL | Status: DC | PRN

## 2021-06-08 MED ORDER — STROKE: EARLY STAGES OF RECOVERY BOOK
Freq: Once | Status: AC
  Filled 2021-06-08: qty 1

## 2021-06-08 MED ORDER — ACETAMINOPHEN 325 MG PO TABS
650.0000 mg | ORAL_TABLET | ORAL | Status: DC | PRN
  Administered 2021-06-12: 650 mg via ORAL
  Filled 2021-06-08: qty 2

## 2021-06-08 MED ORDER — SODIUM CHLORIDE 0.9 % IV SOLN: INTRAVENOUS | Status: DC

## 2021-06-08 MED ORDER — ATORVASTATIN CALCIUM 80 MG PO TABS
80.0000 mg | ORAL_TABLET | Freq: Every day | ORAL | Status: DC
  Administered 2021-06-10 – 2021-06-11 (×2): 80 mg via ORAL
  Filled 2021-06-08 (×3): qty 1

## 2021-06-08 MED ORDER — ASPIRIN EC 325 MG PO TBEC: 325.0000 mg | DELAYED_RELEASE_TABLET | Freq: Every day | ORAL | Status: DC

## 2021-06-08 MED ORDER — ASPIRIN 81 MG PO CHEW: 81.0000 mg | CHEWABLE_TABLET | Freq: Every day | ORAL | Status: DC

## 2021-06-08 MED ORDER — ASPIRIN 300 MG RE SUPP
300.0000 mg | Freq: Every day | RECTAL | Status: DC
Start: 2021-06-09 — End: 2021-06-09
  Administered 2021-06-09: 300 mg via RECTAL

## 2021-06-08 MED ORDER — IOHEXOL 350 MG/ML SOLN
100.0000 mL | Freq: Once | INTRAVENOUS | Status: AC | PRN
  Administered 2021-06-08: 19:00:00 100 mL via INTRAVENOUS

## 2021-06-08 MED ORDER — SODIUM CHLORIDE 0.9% FLUSH
3.0000 mL | Freq: Once | INTRAVENOUS | Status: AC
Start: 2021-06-08 — End: 2021-06-08
  Administered 2021-06-08: 19:00:00 3 mL via INTRAVENOUS

## 2021-06-08 NOTE — Consult Note (Signed)
NEURO HOSPITALIST CONSULT NOTE   Requestig physician: Dr. Melina Copa  Reason for Consult: Acute onset of aphasia, right hemiplegia, right facial droop and left gaze preference.  History obtained from:  EMS and Chart     HPI:                                                                                                                                          Mckenzie Guerrero is an 70 y.o. female with a PMHx of migraine headaches, anxiety, gleeding ulcers, HTN and tremor, presenting to the ED via EMS as a Code Stroke after family found her in her recliner at home with aphasia, right hemiplegia, right facial droop and left gaze preference. LKN per family was 0900 when they spoke with her on the phone. Later in the day family was unable to contact her by phone and she was also not answering her door, so a family member went to the house and opened the door with a spare key to check on her. When he entered, he found her as above and EMS was called. EMS noted the same on scene. Code Stroke was called in the field. On arrival to the ED her condition was unchanged. No obvious signs of trauma. STAT CT head was obtained. CBG 122.   Past Medical History:  Diagnosis Date   Anxiety    Arthritis    osteoarthritis   Complication of anesthesia    GERD (gastroesophageal reflux disease)    Headache    migraines   History of bleeding ulcers 2010   History of hiatal hernia    Hypertension    Neuromuscular disorder (HCC)    tremors   Occasional tremors    PONV (postoperative nausea and vomiting)     Past Surgical History:  Procedure Laterality Date   ANTERIOR CERVICAL DECOMP/DISCECTOMY FUSION N/A 09/28/2019   Procedure: Anterior Cervical Discectomy Fusion - Cervical Four-Cervical Five- Cervical Five-Cervical Six;  Surgeon: Earnie Larsson, MD;  Location: [place];  Service: Neurosurgery;  Laterality: N/A;  Anterior Cervical Discectomy Fusion - Cervical Four-Cervical Five- Cervical Five-Cervical  Six   benign tumor removal from left breast 2003  2003   BUNIONECTOMY Left    Great toe   COLONOSCOPY     ESOPHAGOGASTRODUODENOSCOPY N/A 05/25/2021   Procedure: ESOPHAGOGASTRODUODENOSCOPY (EGD);  Surgeon: Johnathan Hausen, MD;  Location: [facility];  Service: General;  Laterality: N/A;   XI ROBOTIC ASSISTED HIATAL HERNIA REPAIR N/A 05/25/2021   Procedure: XI ROBOTIC ASSISTED HIATAL HERNIA REPAIR WITH FUNDOPLICATION;  Surgeon: Johnathan Hausen, MD;  Location: [facility];  Service: General;  Laterality: N/A;    No family history on file.           Social History:  reports that she has quit smoking. She has  never used smokeless tobacco. She reports that she does not drink alcohol and does not use drugs.  Allergies  Allergen Reactions   Codeine Hives and Itching   Doxycycline Nausea And Vomiting   Nsaids Other (See Comments)    Gi bleeding   Sulfonamide Derivatives Swelling   Prednisone Anxiety    HOME MEDICATIONS:                                                                                                                      No current facility-administered medications on file prior to encounter.   Current Outpatient Medications on File Prior to Encounter  Medication Sig Dispense Refill   acetaminophen (TYLENOL) 500 MG tablet Take 1,000 mg by mouth 2 (two) times daily. Rapid release     ASCORBIC ACID PO Take 1 mg by mouth daily. Gummy     cholecalciferol (VITAMIN D3) 25 MCG (1000 UNIT) tablet Take 1,000 Units by mouth daily.     diclofenac Sodium (VOLTAREN) 1 % GEL Apply 2 g topically daily as needed (pain on Knees and hands).     HYDROcodone-acetaminophen (NORCO/VICODIN) 5-325 MG tablet Take 1 tablet by mouth every 6 (six) hours as needed for moderate pain. 15 tablet 0   loratadine (CLARITIN) 10 MG tablet Take 10 mg by mouth daily.     Multiple Vitamin (MULTIVITAMIN WITH MINERALS) TABS tablet Take 1 tablet by mouth daily. Centrum Silver Woman     ondansetron (ZOFRAN ODT) 4 MG  disintegrating tablet Take 1 tablet (4 mg total) by mouth every 8 (eight) hours as needed for nausea or vomiting. 20 tablet 0   Phenylephrine-Acetaminophen (SINUS PRESSURE + PAIN) 5-325 MG TABS Take 1-2 tablets by mouth daily as needed (congestion).     Polyethyl Glycol-Propyl Glycol (SYSTANE OP) Place 1 drop into both eyes daily as needed (dry eyes).     Probiotic CAPS Take 1 capsule by mouth daily. renew life probiotics     sodium chloride (OCEAN) 0.65 % SOLN nasal spray Place 1 spray into both nostrils daily as needed for congestion (allergies). Simple saline     SUMAtriptan (IMITREX) 50 MG tablet Take 50 mg by mouth every 2 (two) hours as needed for migraine.       ROS:  Unable to obtain due to aphasia.   Weight 61.4 kg.  General Examination:                                                                                                       Physical Exam  HEENT-  [place]/AT   Lungs- Respirations unlabored Extremities- No edema  Neurological Examination Mental Status: Awake with decreased level of alertness. Dense expressive and receptive aphasia. Unable to follow any commands. Mute.  Cranial Nerves: II: No blink to threat on the right. Blinks to threat on the left. PERRL.  III,IV, VI: Leftward gaze. Does not volitionally cross to the right but can be overcome with oculocephalic reflex. No nystagmus.  V: No reaction to stimulation on the right.  VII: Right facial droop.  VIII: Attends to some left sided vocal stimuli. .  IX,X: Deferred gag reflex. Does not open mouth for assessment of palate/uvula.  XI: Head is turned to the left.  XII: Does not protrude tongue Motor/Sensory: RUE with no movement to any stimuli; drifts within 1 second to bed when elevated and released; some tone is present. RLE: Weak withdrawal to noxious rated as 2/5 LUE and LLE: Move  normally to noxious stimuli Deep Tendon Reflexes: Unremarkable Plantars: Equivocal bilaterally  Cerebellar: Unable to assess in the context of inability to follow commands.  Gait: Unable to assess   Lab Results: Basic Metabolic Panel: No results for input(s): NA, K, CL, CO2, GLUCOSE, BUN, CREATININE, CALCIUM, MG, PHOS in the last 168 hours.  CBC: No results for input(s): WBC, NEUTROABS, HGB, HCT, MCV, PLT in the last 168 hours.  Cardiac Enzymes: No results for input(s): CKTOTAL, CKMB, CKMBINDEX, TROPONINI in the last 168 hours.  Lipid Panel: No results for input(s): CHOL, TRIG, HDL, CHOLHDL, VLDL, LDLCALC in the last 168 hours.  Imaging: No results found.   Assessment: 70 y.o. female presenting to the ED via EMS as a Code Stroke after family found her in her recliner at home with aphasia, right hemiplegia, right facial droop and left gaze preference. LKN per family was 0900 when they spoke with her on the phone. Later in the day family was unable to contact her by phone and she was also not answering her door, so a family member went to the house and opened the door with a spare key to check on her. When he entered, he found her as above and EMS was called. EMS noted the same on scene. Code Stroke was called in the field. On arrival to the ED her condition was unchanged. No obvious signs of trauma.   1. Exam reveals dense receptive and expressive aphasia, right hemiplegia, right facial droop and left gaze preference. 2. CT head preliminary read reveals a large left MCA territory ischemic infarction. ASPECTS of 3.  3. CTA of head and neck with CTP: No intracranial large vessel occlusion. 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 4. Out of the IV thrombolysis time window.  5. Not a thrombectomy candidate due to large completed stroke with ASPECTS of 3.   Recommendations: 1.  HgbA1c, fasting lipid panel 2. MRI of the brain without contrast 3. PT consult, OT consult, Speech consult 4. TTE 5. Atorvastatin 6. Start ASA 81 mg po qd.  7. Risk factor modification 8. Telemetry monitoring 9. Frequent neuro checks 10. NPO until passes stroke swallow screen 11. BP management with modified permissive HTN protocol given large stroke with high risk of bleeding. Treat SBP if > 180. After 24 hours reduce SBP by 15% per day to goal of 120-140.     Electronically signed: Dr. Kerney Elbe 06/08/2021, 6:43 PM

## 2021-06-08 NOTE — Code Documentation (Signed)
Stroke Response Nurse Documentation Code Documentation  Mckenzie Guerrero is a 70 y.o. female arriving to [facility] ED via [place] [facility] on 06/08/21 with past medical hx of GERD, HTN, recent hiatal hernia repair. On No antithrombotic. Code stroke was activated by EMS.   Patient from home where she was LKW at 0900 talking on phone to family. Family became concerned later in day when patient did not answer phone or door. She was found sitting in chair. It is unknown whether patient fell at any point as small red area noted to right forehead and abrasion to right elbow. Patient mute, weak on right side, left gaze preference.  Stroke team at the bedside on patient arrival. Labs drawn and patient cleared for CT by Dr. Melina Copa. Patient to CT with team. NIHSS 24, see documentation for details and code stroke times. Patient with disoriented, not following commands, left gaze preference , right hemianopia, right facial droop, right arm weakness, bilateral leg weakness, right decreased sensation, Global aphasia , dysarthria , and right neglect on exam.  The following imaging was completed: CT, CTA head and neck, CTP. Patient is not a candidate for IV Thrombolytic due to out of window. Patient is not a candidate for IR due to ASPECTS 3.   Care/Plan: q2h NIH, SBP <180.   Bedside handoff with ED RN Margreta Journey.    Candace Cruise K  Stroke Response RN

## 2021-06-08 NOTE — ED Triage Notes (Signed)
Brought by [facility] EMS after determining code stroke. LKW 0900 per family who spoke to her on the phone at that time. Family was then unable to reach her throughout the day and went to her house where they found her down in the floor, unresponsive. Pt has a small abrasion to right eye socket. Right-sided facial droop and weakness noted; right foot extension to stimulation, left foot flexion. Bilateral 18ga IVs present in [facility] on arrival. Able to follow some commands but no verbalization and complete right-sided gaze inattention. NIHSS 25 on arrival. Large MCA stroke identified on CT scan per provider.

## 2021-06-08 NOTE — ED Notes (Signed)
Unable to obtain MSE signature at time of triage due to pt's condition.

## 2021-06-08 NOTE — H&P (Signed)
History and Physical    Mckenzie Guerrero ZOX:[id number] DOB: 07/30/1950 DOA: 06/08/2021  PCP: Raina Mina., MD Patient coming from: Home  Chief Complaint: Code stroke  HPI: Mckenzie Guerrero is a 70 y.o. female with medical history significant of migraine headaches, bleeding ulcers, hypertension, tremors, hiatal hernia and GERD status post recent laparoscopic Nissen fundoplication on 81/19/1478.  She presents to the ED today via EMS as code stroke after family found her in her recliner at home with aphasia, right hemiplegia, right facial droop, and left gaze preference. LKN 0900 per family when they spoke to her on the phone.  Later in the day family was not able to contact her by phone and she was not answering her door, so a family member went into the house and open the door with a spare key to check on her.  When he entered, he found her as above and EMS was called.  EMS noted the same on scene.  Code stroke was called and on arrival to the ED patient's condition was unchanged.  CT head revealed acute to early subacute left MCA territory infarct without evidence of hemorrhagic transformation.  CTA head and neck with CTP: No intracranial LVO.  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  Out of tPA window at the time of presentation.  Not a thrombectomy candidate due to large completed stroke.  Labs notable for WBC 15.4, platelet count 442k.   Review of Systems:  All systems reviewed and apart from history of presenting illness, are negative.  Past Medical History:  Diagnosis Date   Anxiety    Arthritis    osteoarthritis   Complication of anesthesia    GERD (gastroesophageal reflux disease)    Headache    migraines   History of bleeding ulcers 2010   History of hiatal hernia    Hypertension    Neuromuscular disorder (HCC)    tremors   Occasional tremors    PONV  (postoperative nausea and vomiting)     Past Surgical History:  Procedure Laterality Date   ANTERIOR CERVICAL DECOMP/DISCECTOMY FUSION N/A 09/28/2019   Procedure: Anterior Cervical Discectomy Fusion - Cervical Four-Cervical Five- Cervical Five-Cervical Six;  Surgeon: Earnie Larsson, MD;  Location: [place] [facility];  Service: Neurosurgery;  Laterality: N/A;  Anterior Cervical Discectomy Fusion - Cervical Four-Cervical Five- Cervical Five-Cervical Six   benign tumor removal from left breast 2003  2003   BUNIONECTOMY Left    Great toe   COLONOSCOPY     ESOPHAGOGASTRODUODENOSCOPY N/A 05/25/2021   Procedure: ESOPHAGOGASTRODUODENOSCOPY (EGD);  Surgeon: Johnathan Hausen, MD;  Location: [facility];  Service: General;  Laterality: N/A;   XI ROBOTIC ASSISTED HIATAL HERNIA REPAIR N/A 05/25/2021   Procedure: XI ROBOTIC ASSISTED HIATAL HERNIA REPAIR WITH FUNDOPLICATION;  Surgeon: Johnathan Hausen, MD;  Location: [facility];  Service: General;  Laterality: N/A;     reports that she has quit smoking. She has never used smokeless tobacco. She reports that she does not drink alcohol and does not use drugs.  Allergies  Allergen Reactions   Codeine Hives and Itching   Doxycycline Nausea And Vomiting   Nsaids Other (See Comments)    Gi bleeding   Sulfonamide Derivatives Swelling   Prednisone Anxiety    Family history: Unable to obtain at this time due to patient's aphasia.  Prior to Admission medications   Medication Sig Start Date  End Date Taking? Authorizing Provider  acetaminophen (TYLENOL) 500 MG tablet Take 1,000 mg by mouth 2 (two) times daily. Rapid release    [provider]  ASCORBIC ACID PO Take 1 mg by mouth daily. Gummy    [provider]  cholecalciferol (VITAMIN D3) 25 MCG (1000 UNIT) tablet Take 1,000 Units by mouth daily.    [provider]  diclofenac Sodium (VOLTAREN) 1 % GEL Apply 2 g topically daily as needed (pain on Knees and hands).    [provider]   HYDROcodone-acetaminophen (NORCO/VICODIN) 5-325 MG tablet Take 1 tablet by mouth every 6 (six) hours as needed for moderate pain. 05/27/21   Johnathan Hausen, MD  loratadine (CLARITIN) 10 MG tablet Take 10 mg by mouth daily.    [provider]  Multiple Vitamin (MULTIVITAMIN WITH MINERALS) TABS tablet Take 1 tablet by mouth daily. Centrum [place]    [provider]  ondansetron (ZOFRAN ODT) 4 MG disintegrating tablet Take 1 tablet (4 mg total) by mouth every 8 (eight) hours as needed for nausea or vomiting. 05/27/21   Johnathan Hausen, MD  Phenylephrine-Acetaminophen (SINUS PRESSURE + PAIN) 5-325 MG TABS Take 1-2 tablets by mouth daily as needed (congestion).    [provider]  Polyethyl Glycol-Propyl Glycol (SYSTANE OP) Place 1 drop into both eyes daily as needed (dry eyes).    [provider]  Probiotic CAPS Take 1 capsule by mouth daily. renew life probiotics    [provider]  sodium chloride (OCEAN) 0.65 % SOLN nasal spray Place 1 spray into both nostrils daily as needed for congestion (allergies). Simple saline    [provider]  SUMAtriptan (IMITREX) 50 MG tablet Take 50 mg by mouth every 2 (two) hours as needed for migraine.  03/13/17   [provider]    Physical Exam: Vitals:   06/08/21 1930 06/08/21 2011 06/08/21 2145 06/08/21 2200  BP: (!) 164/92 (!) 164/107 (!) 174/86 (!) 167/94  Pulse: 91 89 85 77  Resp: (!) 21 (!) 21 18 16   Temp:      TempSrc:      SpO2: 97% 97% 94% 94%  Weight:      Height:        Physical Exam Constitutional:      General: She is not in acute distress. HENT:     Head: Normocephalic and atraumatic.  Eyes:     Conjunctiva/sclera: Conjunctivae normal.  Cardiovascular:     Rate and Rhythm: Normal rate and regular rhythm.     Pulses: Normal pulses.  Pulmonary:     Effort: Pulmonary effort is normal. No respiratory distress.     Breath sounds: No wheezing or rales.  Abdominal:      General: Bowel sounds are normal. There is no distension.     Palpations: Abdomen is soft.     Tenderness: There is no abdominal tenderness.  Musculoskeletal:        General: No swelling or tenderness.     Cervical back: Normal range of motion and neck supple.  Skin:    General: Skin is warm and dry.  Neurological:     Comments: Aphasia Left gaze preference Right facial droop Right hemiplegia     Labs on Admission: I have personally reviewed following labs and imaging studies  CBC: Recent Labs  Lab 06/08/21 1839 06/08/21 1844  WBC 15.4*  --   NEUTROABS 12.5*  --   HGB 13.0 13.3  HCT 39.9 39.0  MCV 93.0  --  PLT 442*  --    Basic Metabolic Panel: Recent Labs  Lab 06/08/21 1839 06/08/21 1844  NA 136 138  K 3.5 3.6  CL 105 107  CO2 23  --   GLUCOSE 125* 121*  BUN 10 10  CREATININE 0.61 0.50  CALCIUM 8.7*  --    GFR: Estimated Creatinine Clearance: 58.9 mL/min (by C-G formula based on SCr of 0.5 mg/dL). Liver Function Tests: Recent Labs  Lab 06/08/21 1839  AST 21  ALT 20  ALKPHOS 77  BILITOT 0.5  PROT 6.3*  ALBUMIN 3.5   No results for input(s): LIPASE, AMYLASE in the last 168 hours. No results for input(s): AMMONIA in the last 168 hours. Coagulation Profile: Recent Labs  Lab 06/08/21 1839  INR 1.1   Cardiac Enzymes: No results for input(s): CKTOTAL, CKMB, CKMBINDEX, TROPONINI in the last 168 hours. BNP (last 3 results) No results for input(s): PROBNP in the last 8760 hours. HbA1C: No results for input(s): HGBA1C in the last 72 hours. CBG: Recent Labs  Lab 06/08/21 1838  GLUCAP 122*   Lipid Profile: No results for input(s): CHOL, HDL, LDLCALC, TRIG, CHOLHDL, LDLDIRECT in the last 72 hours. Thyroid Function Tests: No results for input(s): TSH, T4TOTAL, FREET4, T3FREE, THYROIDAB in the last 72 hours. Anemia Panel: No results for input(s): VITAMINB12, FOLATE, FERRITIN, TIBC, IRON, RETICCTPCT in the last 72 hours. Urine analysis: No results  found for: COLORURINE, APPEARANCEUR, LABSPEC, [place], GLUCOSEU, [place], [place], [place], PROTEINUR, UROBILINOGEN, NITRITE, LEUKOCYTESUR  Radiological Exams on Admission: CT HEAD CODE STROKE WO CONTRAST  Result Date: 06/08/2021 CLINICAL DATA:  Code stroke.  Right-sided weakness EXAM: CT HEAD WITHOUT CONTRAST TECHNIQUE: Contiguous axial images were obtained from the base of the skull through the vertex without intravenous contrast. COMPARISON:  CT head 04/19/2010 FINDINGS: Brain: There is confluent hypodensity throughout the left MCA territory consistent with acute to early subacute infarct. There is no evidence of hemorrhagic transformation. There is swelling of the involved gyri with effacement of the sulci but no midline shift. There is no evidence of acute intracranial hemorrhage. The ventricles are normal in size. There is no solid mass lesion. Vascular: No definite dense vessel sign is seen. Skull: Normal. Negative for fracture or focal lesion. Sinuses/Orbits: The paranasal sinuses are clear. The globes and orbits are unremarkable. Other: None. ASPECTS [facility] Stroke Program Early CT Score) - Ganglionic level infarction (caudate, lentiform nuclei, internal capsule, insula, M1-M3 cortex): 3 - Supraganglionic infarction (M4-M6 cortex): 1 (M5 appears to be spared) Total score (0-10 with 10 being normal): 4 IMPRESSION: 1. Acute to early subacute left MCA territorial infarct without evidence of hemorrhagic transformation. 2. ASPECTS is 4 These results were called by telephone at the time of interpretation on 06/08/2021 at 6:56 pm to provider Dr Cheral Marker, who verbally acknowledged these results. Electronically Signed   By: Valetta Mole M.D.   On: 06/08/2021 19:05   CT ANGIO HEAD NECK W WO CM W PERF (CODE STROKE)  Result Date: 06/08/2021 CLINICAL DATA:  Acute neurologic deficit EXAM: CT ANGIOGRAPHY HEAD AND NECK CT PERFUSION BRAIN TECHNIQUE: Multidetector CT imaging of the head and neck was performed  using the standard protocol during bolus administration of intravenous contrast. Multiplanar CT image reconstructions and MIPs were obtained to evaluate the vascular anatomy. Carotid stenosis measurements (when applicable) are obtained utilizing NASCET criteria, using the distal internal carotid diameter as the denominator. Multiphase CT imaging of the brain was performed following IV bolus contrast injection. Subsequent parametric perfusion maps were calculated  using RAPID software. CONTRAST:  136mL OMNIPAQUE IOHEXOL 350 MG/ML SOLN COMPARISON:  Head CT 06/08/2021 FINDINGS: CTA NECK FINDINGS SKELETON: There is no bony spinal canal stenosis. No lytic or blastic lesion. OTHER NECK: Normal pharynx, larynx and major salivary glands. No cervical lymphadenopathy. Unremarkable thyroid gland. UPPER CHEST: No pneumothorax or pleural effusion. No nodules or masses. AORTIC ARCH: There is calcific atherosclerosis of the aortic arch. There is no aneurysm, dissection or hemodynamically significant stenosis of the visualized portion of the aorta. Conventional 3 vessel aortic branching pattern. The visualized proximal subclavian arteries are widely patent. RIGHT CAROTID SYSTEM: Normal without aneurysm, dissection or stenosis. LEFT CAROTID SYSTEM: Normal without aneurysm, dissection or stenosis. VERTEBRAL ARTERIES: Left dominant configuration. 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 Left vertebral artery is normal. CTA HEAD FINDINGS POSTERIOR CIRCULATION: --Vertebral arteries: Normal V4 segments. --Inferior cerebellar arteries: Normal. --Basilar artery: Normal. --Superior cerebellar arteries: Normal. --Posterior cerebral arteries (PCA): Normal. ANTERIOR CIRCULATION: --Intracranial internal carotid arteries: Normal. --Anterior cerebral arteries (ACA): Normal. Both A1 segments are present. Patent anterior communicating artery (a-comm). --Middle cerebral arteries (MCA): Normal. VENOUS SINUSES: As permitted  by contrast timing, patent. ANATOMIC VARIANTS: None Review of the MIP images confirms the above findings. CT Brain Perfusion Findings: ASPECTS: 4 CBF (<30%) Volume: 57mL Perfusion (Tmax>6.0s) volume: 83mL Mismatch Volume: 90mL Infarction Location:Left MCA territory IMPRESSION: 1. No intracranial large vessel occlusion. 2. 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 3. 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 Aortic Atherosclerosis (ICD10-I70.0). Electronically Signed   By: Ulyses Jarred M.D.   On: 06/08/2021 19:23    EKG: Independently reviewed.  Sinus rhythm with first-degree AV block, borderline QT prolongation.  No acute ischemic changes.  Assessment/Plan Principal Problem:   Acute CVA (cerebrovascular accident) ([place]) Active Problems:   HTN (hypertension)   Leukocytosis   Acute CVA Patient is here for evaluation of aphasia, right hemiplegia, right facial droop, and left gaze preference. LKW 0900 per family. CT head revealed acute to early subacute left MCA territory infarct without evidence of hemorrhagic transformation.  CTA head and neck with CTP: No intracranial LVO.  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  Out of tPA window at the time of presentation.  Not a thrombectomy candidate due to large completed stroke.  -Neurology following, appreciate recommendations -Telemetry monitoring -MRI of the brain without contrast -Transthoracic echocardiogram -Hemoglobin A1c, fasting lipid panel -Start aspirin 81 mg daily and atorvastatin 80 mg daily. -Frequent neurochecks -PT, OT, speech therapy. -N.p.o. until cleared by bedside swallow evaluation or formal speech evaluation   Hypertension -Allow for permissive hypertension at this time, treat only if SBP >180.  After 24 hours, reduce SBP by 15 % per day to goal  of 120-140.  Leukocytosis WBC 15.4.  No fever or tachycardia to suggest sepsis.  Recently underwent laparoscopic Nissen fundoplication for hiatal hernia 2 weeks ago, no obvious signs of infection at surgical incision site.  Abdominal exam benign. -Chest x-ray, UA  DVT prophylaxis: SCDs Code Status: Full code Family Communication: No family available at this time. Disposition Plan: Status is: Inpatient  Remains inpatient appropriate because: Acute CVA  Level of care:  Level of care: Telemetry Medical  The medical decision making on this patient was of high complexity and the patient is at high risk  for clinical deterioration, therefore this is a level 3 visit.  Shela Leff MD [facility]  If 7PM-7AM, please contact night-coverage [email or web address]  06/08/2021, 10:17 PM

## 2021-06-08 NOTE — ED Provider Notes (Signed)
[facility] EMERGENCY DEPARTMENT Provider Note   CSN: [id number] Arrival date & time: 06/08/21  2876  An emergency department physician performed an initial assessment on this suspected stroke patient at [facility].  History Chief Complaint  Patient presents with   Code Stroke    Mckenzie Guerrero is a 70 y.o. female.  The history is provided by the EMS personnel and medical records.  70 year old female with history of GERD and recent hiatal hernia repair who presents as an activated code stroke.  She was last seen normal at 9:00 this morning.  Family had called to check in on her around 5:00 this evening, when they noted that she was aphasic and slurring her speech.  EMS was called and found the patient to be aphasic w/ right upper and lower extremity weakness and L sided gaze.  She had no signs of head injury.  She is not on any anticoagulation.  She was brought to the ED for further evaluation.  POC glucose normal in route.  She was otherwise afebrile and hemodynamically stable.     Past Medical History:  Diagnosis Date   Anxiety    Arthritis    osteoarthritis   Complication of anesthesia    GERD (gastroesophageal reflux disease)    Headache    migraines   History of bleeding ulcers 2010   History of hiatal hernia    Hypertension    Neuromuscular disorder (HCC)    tremors   Occasional tremors    PONV (postoperative nausea and vomiting)     Patient Active Problem List   Diagnosis Date Noted   Acute CVA (cerebrovascular accident) ([place]) 06/08/2021   Status post laparoscopic Nissen fundoplication 81/15/7262   Cervical spondylosis with myelopathy and radiculopathy 09/28/2019    Past Surgical History:  Procedure Laterality Date   ANTERIOR CERVICAL DECOMP/DISCECTOMY FUSION N/A 09/28/2019   Procedure: Anterior Cervical Discectomy Fusion - Cervical Four-Cervical Five- Cervical Five-Cervical Six;  Surgeon: Earnie Larsson, MD;  Location: [facility];  Service: Neurosurgery;   Laterality: N/A;  Anterior Cervical Discectomy Fusion - Cervical Four-Cervical Five- Cervical Five-Cervical Six   benign tumor removal from left breast 2003  2003   BUNIONECTOMY Left    Great toe   COLONOSCOPY     ESOPHAGOGASTRODUODENOSCOPY N/A 05/25/2021   Procedure: ESOPHAGOGASTRODUODENOSCOPY (EGD);  Surgeon: Johnathan Hausen, MD;  Location: [facility];  Service: General;  Laterality: N/A;   XI ROBOTIC ASSISTED HIATAL HERNIA REPAIR N/A 05/25/2021   Procedure: XI ROBOTIC ASSISTED HIATAL HERNIA REPAIR WITH FUNDOPLICATION;  Surgeon: Johnathan Hausen, MD;  Location: [facility];  Service: General;  Laterality: N/A;     OB History   No obstetric history on file.     No family history on file.  Social History   Tobacco Use   Smoking status: Former   Smokeless tobacco: Never  Scientific laboratory technician Use: Never used  Substance Use Topics   Alcohol use: Never   Drug use: Never    Home Medications Prior to Admission medications   Medication Sig Start Date End Date Taking? Authorizing Provider  acetaminophen (TYLENOL) 500 MG tablet Take 1,000 mg by mouth 2 (two) times daily. Rapid release    [provider]  ASCORBIC ACID PO Take 1 mg by mouth daily. Gummy    [provider]  cholecalciferol (VITAMIN D3) 25 MCG (1000 UNIT) tablet Take 1,000 Units by mouth daily.    [provider]  diclofenac Sodium (VOLTAREN) 1 % GEL Apply 2  g topically daily as needed (pain on Knees and hands).    [provider]  HYDROcodone-acetaminophen (NORCO/VICODIN) 5-325 MG tablet Take 1 tablet by mouth every 6 (six) hours as needed for moderate pain. 05/27/21   Johnathan Hausen, MD  loratadine (CLARITIN) 10 MG tablet Take 10 mg by mouth daily.    [provider]  Multiple Vitamin (MULTIVITAMIN WITH MINERALS) TABS tablet Take 1 tablet by mouth daily. Centrum Silver Woman    [provider]  ondansetron (ZOFRAN ODT) 4 MG disintegrating tablet Take 1 tablet (4 mg total) by  mouth every 8 (eight) hours as needed for nausea or vomiting. 05/27/21   Johnathan Hausen, MD  Phenylephrine-Acetaminophen (SINUS PRESSURE + PAIN) 5-325 MG TABS Take 1-2 tablets by mouth daily as needed (congestion).    [provider]  Polyethyl Glycol-Propyl Glycol (SYSTANE OP) Place 1 drop into both eyes daily as needed (dry eyes).    [provider]  Probiotic CAPS Take 1 capsule by mouth daily. renew life probiotics    [provider]  sodium chloride (OCEAN) 0.65 % SOLN nasal spray Place 1 spray into both nostrils daily as needed for congestion (allergies). Simple saline    [provider]  SUMAtriptan (IMITREX) 50 MG tablet Take 50 mg by mouth every 2 (two) hours as needed for migraine.  03/13/17   [provider]    Allergies    Codeine, Doxycycline, Nsaids, Sulfonamide derivatives, and Prednisone  Review of Systems   Review of Systems  Unable to perform ROS: Mental status change  Neurological:  Positive for speech difficulty and weakness.   Physical Exam Updated Vital Signs BP (!) 164/107   Pulse 89   Temp 99.1 F (37.3 C) (Oral)   Resp (!) 21   Ht 5\' 5"  (1.651 m)   Wt 61.4 kg   SpO2 97%   BMI 22.53 kg/m   Physical Exam Vitals and nursing note reviewed.  Constitutional:      General: She is not in acute distress.    Appearance: She is well-developed.     Comments: Leftward gaze deviation, will not track to the right  HENT:     Head: Normocephalic and atraumatic.     Right Ear: External ear normal.     Left Ear: External ear normal.     Nose: Nose normal. No congestion or rhinorrhea.     Mouth/Throat:     Mouth: Mucous membranes are moist.  Eyes:     Conjunctiva/sclera: Conjunctivae normal.     Pupils: Pupils are equal, round, and reactive to light.  Cardiovascular:     Rate and Rhythm: Normal rate and regular rhythm.     Pulses: Normal pulses.     Heart sounds: No murmur heard. Pulmonary:     Effort: Pulmonary effort  is normal. No respiratory distress.     Breath sounds: Normal breath sounds. No wheezing, rhonchi or rales.  Abdominal:     General: Abdomen is flat. Bowel sounds are normal.     Palpations: Abdomen is soft.     Tenderness: There is no abdominal tenderness. There is no guarding or rebound.  Musculoskeletal:        General: No swelling, tenderness or deformity.     Cervical back: Normal range of motion and neck supple. No rigidity.  Skin:    General: Skin is warm and dry.     Capillary Refill: Capillary refill takes less than 2 seconds.  Neurological:     Mental  Status: She is alert. She is confused.     GCS: GCS eye subscore is 4. GCS verbal subscore is 2. GCS motor subscore is 5.     Cranial Nerves: Dysarthria and facial asymmetry (L facial droop) present.     Motor: Weakness (R upper and lower extremity 1/5 strength compared to L) present. No tremor, abnormal muscle tone or seizure activity.     Coordination: Coordination is intact.    ED Results / Procedures / Treatments   Labs (all labs ordered are listed, but only abnormal results are displayed) Labs Reviewed  CBC - Abnormal; Notable for the following components:      Result Value   WBC 15.4 (*)    Platelets 442 (*)    All other components within normal limits  DIFFERENTIAL - Abnormal; Notable for the following components:   Neutro Abs 12.5 (*)    All other components within normal limits  COMPREHENSIVE METABOLIC PANEL - Abnormal; Notable for the following components:   Glucose, Bld 125 (*)    Calcium 8.7 (*)    Total Protein 6.3 (*)    All other components within normal limits  I-STAT CHEM 8, ED - Abnormal; Notable for the following components:   Glucose, Bld 121 (*)    Calcium, Ion 1.05 (*)    All other components within normal limits  CBG MONITORING, ED - Abnormal; Notable for the following components:   Glucose-Capillary 122 (*)    All other components within normal limits  PROTIME-INR  APTT    EKG EKG  Interpretation  Date/Time:  Monday June 08 2021 19:21:15 EST Ventricular Rate:  90 PR Interval:  256 QRS Duration: 102 QT Interval:  398 QTC Calculation: 487 R Axis:   37 Text Interpretation: Sinus rhythm Prolonged PR interval Borderline T wave abnormalities Borderline prolonged QT interval Confirmed by Aletta Edouard [phone]) on 06/08/2021 7:29:33 PM  Radiology CT HEAD CODE STROKE WO CONTRAST  Result Date: 06/08/2021 CLINICAL DATA:  Code stroke.  Right-sided weakness EXAM: CT HEAD WITHOUT CONTRAST TECHNIQUE: Contiguous axial images were obtained from the base of the skull through the vertex without intravenous contrast. COMPARISON:  CT head 04/19/2010 FINDINGS: Brain: There is confluent hypodensity throughout the left MCA territory consistent with acute to early subacute infarct. There is no evidence of hemorrhagic transformation. There is swelling of the involved gyri with effacement of the sulci but no midline shift. There is no evidence of acute intracranial hemorrhage. The ventricles are normal in size. There is no solid mass lesion. Vascular: No definite dense vessel sign is seen. Skull: Normal. Negative for fracture or focal lesion. Sinuses/Orbits: The paranasal sinuses are clear. The globes and orbits are unremarkable. Other: None. ASPECTS [facility] Stroke Program Early CT Score) - Ganglionic level infarction (caudate, lentiform nuclei, internal capsule, insula, M1-M3 cortex): 3 - Supraganglionic infarction (M4-M6 cortex): 1 (M5 appears to be spared) Total score (0-10 with 10 being normal): 4 IMPRESSION: 1. Acute to early subacute left MCA territorial infarct without evidence of hemorrhagic transformation. 2. ASPECTS is 4 These results were called by telephone at the time of interpretation on 06/08/2021 at 6:56 pm to provider Dr Cheral Marker, who verbally acknowledged these results. Electronically Signed   By: Valetta Mole M.D.   On: 06/08/2021 19:05   CT ANGIO HEAD NECK W WO CM W PERF (CODE  STROKE)  Result Date: 06/08/2021 CLINICAL DATA:  Acute neurologic deficit EXAM: CT ANGIOGRAPHY HEAD AND NECK CT PERFUSION BRAIN TECHNIQUE: Multidetector CT imaging of the head  and neck was performed using the standard protocol during bolus administration of intravenous contrast. Multiplanar CT image reconstructions and MIPs were obtained to evaluate the vascular anatomy. Carotid stenosis measurements (when applicable) are obtained utilizing NASCET criteria, using the distal internal carotid diameter as the denominator. Multiphase CT imaging of the brain was performed following IV bolus contrast injection. Subsequent parametric perfusion maps were calculated using RAPID software. CONTRAST:  139mL OMNIPAQUE IOHEXOL 350 MG/ML SOLN COMPARISON:  Head CT 06/08/2021 FINDINGS: CTA NECK FINDINGS SKELETON: There is no bony spinal canal stenosis. No lytic or blastic lesion. OTHER NECK: Normal pharynx, larynx and major salivary glands. No cervical lymphadenopathy. Unremarkable thyroid gland. UPPER CHEST: No pneumothorax or pleural effusion. No nodules or masses. AORTIC ARCH: There is calcific atherosclerosis of the aortic arch. There is no aneurysm, dissection or hemodynamically significant stenosis of the visualized portion of the aorta. Conventional 3 vessel aortic branching pattern. The visualized proximal subclavian arteries are widely patent. RIGHT CAROTID SYSTEM: Normal without aneurysm, dissection or stenosis. LEFT CAROTID SYSTEM: Normal without aneurysm, dissection or stenosis. VERTEBRAL ARTERIES: Left dominant configuration. 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 Left vertebral artery is normal. CTA HEAD FINDINGS POSTERIOR CIRCULATION: --Vertebral arteries: Normal V4 segments. --Inferior cerebellar arteries: Normal. --Basilar artery: Normal. --Superior cerebellar arteries: Normal. --Posterior cerebral arteries (PCA): Normal. ANTERIOR CIRCULATION: --Intracranial internal carotid  arteries: Normal. --Anterior cerebral arteries (ACA): Normal. Both A1 segments are present. Patent anterior communicating artery (a-comm). --Middle cerebral arteries (MCA): Normal. VENOUS SINUSES: As permitted by contrast timing, patent. ANATOMIC VARIANTS: None Review of the MIP images confirms the above findings. CT Brain Perfusion Findings: ASPECTS: 4 CBF (<30%) Volume: 36mL Perfusion (Tmax>6.0s) volume: 34mL Mismatch Volume: 27mL Infarction Location:Left MCA territory IMPRESSION: 1. No intracranial large vessel occlusion. 2. 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 3. 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 Aortic Atherosclerosis (ICD10-I70.0). Electronically Signed   By: Ulyses Jarred M.D.   On: 06/08/2021 19:23    Procedures Procedures   Medications Ordered in ED Medications  labetalol (NORMODYNE) injection 5 mg (has no administration in time range)  sodium chloride flush (NS) 0.9 % injection 3 mL (3 mLs Intravenous Given 06/08/21 1925)  iohexol (OMNIPAQUE) 350 MG/ML injection 100 mL (100 mLs Intravenous Contrast Given 06/08/21 1903)    ED Course  I have reviewed the triage vital signs and the nursing notes.  Pertinent labs & imaging results that were available during my care of the patient were reviewed by me and considered in my medical decision making (see chart for details).   MDM Rules/Calculators/A&P                          70 year old female presents as code stroke.  Last known normal 9 AM.  Presented with right-sided extremity weakness and left gaze preference.  Exam as above.  She was rapidly assessed by myself and the neurology team before transport to CT for emergent head imaging.  CT showed left M1 infarct.  She is outside of the window for thrombolytics or mechanical intervention.  Recommend admission for every 2 hours neurochecks and strict blood pressure control with SBP goal less than 180.  Medicine  contacted for admission. Hand off given. As needed labetalol ordered.  Final Clinical Impression(s) / ED Diagnoses Final diagnoses:  Acute ischemic stroke [facility])    Rx / DC Orders ED Discharge  Orders     None        Idamae Lusher, MD 06/08/21 2042    Hayden Rasmussen, MD 06/09/21 1045

## 2021-06-08 NOTE — ED Notes (Signed)
Pt had hernia repair surgery 2 weeks ago per family at bedside. Laparoscopic incisions are well-approximated and appear to be healing very well.

## 2021-06-08 NOTE — ED Notes (Addendum)
Pt unable to complete swallow screen safely per RN judgment. She is able to take very small sips of water with assistance but cannot safely swallow independently at this time. Will hold PO meds for now and place order for SLP eval per protocol. Admitting MD notified.

## 2021-06-09 ENCOUNTER — Inpatient Hospital Stay (HOSPITAL_COMMUNITY): Payer: PPO

## 2021-06-09 DIAGNOSIS — I6389 Other cerebral infarction: Secondary | ICD-10-CM

## 2021-06-09 DIAGNOSIS — I639 Cerebral infarction, unspecified: Secondary | ICD-10-CM

## 2021-06-09 LAB — ECHOCARDIOGRAM COMPLETE
AV Mean grad: 3 mmHg
AV Peak grad: 4.7 mmHg
Ao pk vel: 1.08 m/s
Area-P 1/2: 3.45 cm2
Calc EF: 59.3 %
Height: 65 in
S' Lateral: 1.9 cm
Single Plane A2C EF: 58.4 %
Single Plane A4C EF: 60.7 %
Weight: 2165.8 oz

## 2021-06-09 LAB — CBC
HCT: 44.7 % (ref 36.0–46.0)
Hemoglobin: 14.6 g/dL (ref 12.0–15.0)
MCH: 29.9 pg (ref 26.0–34.0)
MCHC: 32.7 g/dL (ref 30.0–36.0)
MCV: 91.6 fL (ref 80.0–100.0)
Platelets: 515 10*3/uL — ABNORMAL HIGH (ref 150–400)
RBC: 4.88 MIL/uL (ref 3.87–5.11)
RDW: 12.5 % (ref 11.5–15.5)
WBC: 21.9 10*3/uL — ABNORMAL HIGH (ref 4.0–10.5)
nRBC: 0 % (ref 0.0–0.2)

## 2021-06-09 LAB — CBG MONITORING, ED: Glucose-Capillary: 132 mg/dL — ABNORMAL HIGH (ref 70–99)

## 2021-06-09 LAB — RESP PANEL BY RT-PCR (FLU A&B, COVID) ARPGX2
Influenza A by PCR: NEGATIVE
Influenza B by PCR: NEGATIVE
SARS Coronavirus 2 by RT PCR: NEGATIVE

## 2021-06-09 LAB — LIPID PANEL
Cholesterol: 200 mg/dL (ref 0–200)
HDL: 46 mg/dL (ref >40)
LDL Cholesterol: 139 mg/dL — ABNORMAL HIGH (ref 0–99)
Total CHOL/HDL Ratio: 4.3 RATIO
Triglycerides: 75 mg/dL (ref <150)
VLDL: 15 mg/dL (ref 0–40)

## 2021-06-09 LAB — HEMOGLOBIN A1C
Hgb A1c MFr Bld: 5.8 % — ABNORMAL HIGH (ref 4.8–5.6)
Mean Plasma Glucose: 120 mg/dL

## 2021-06-09 LAB — HIV ANTIBODY (ROUTINE TESTING W REFLEX): HIV Screen 4th Generation wRfx: NONREACTIVE

## 2021-06-09 MED ORDER — LORAZEPAM 2 MG/ML IJ SOLN
0.5000 mg | Freq: Once | INTRAMUSCULAR | Status: AC
  Administered 2021-06-09: 0.5 mg via INTRAVENOUS

## 2021-06-09 MED ORDER — LABETALOL HCL 5 MG/ML IV SOLN
5.0000 mg | INTRAVENOUS | Status: DC | PRN
  Administered 2021-06-09: 5 mg via INTRAVENOUS

## 2021-06-09 MED ORDER — ASPIRIN 300 MG RE SUPP
300.0000 mg | Freq: Every day | RECTAL | Status: DC
  Filled 2021-06-09: qty 1

## 2021-06-09 MED ORDER — DEXTROSE IN LACTATED RINGERS 5 % IV SOLN: INTRAVENOUS | Status: AC

## 2021-06-09 MED ORDER — LORAZEPAM 2 MG/ML IJ SOLN
INTRAMUSCULAR | Status: AC
  Filled 2021-06-09: qty 1

## 2021-06-09 MED ORDER — ASPIRIN 300 MG RE SUPP
RECTAL | Status: AC
  Filled 2021-06-09: qty 1

## 2021-06-09 MED ORDER — SODIUM CHLORIDE 0.9 % IV SOLN: INTRAVENOUS | Status: DC

## 2021-06-09 NOTE — Progress Notes (Signed)
Inpatient Rehab Admissions Coordinator:   Per PT recommendation, patient was screened for CIR candidacy by Clemens Catholic, MS, CCC-SLP. At this time, Pt. Has not yet attempted OOB and it is not clear if she is able to tolerate the intensity of CIR.   Pt. may have potential to progress to becoming a potential CIR candidate, so CIR admissions team will follow and monitor for progress and participation with therapies and place consult order if Pt. appears to be an appropriate candidate. Please contact me with any questions.   Clemens Catholic, [place], [place] Admissions Coordinator  [phone] ([place]) [phone] (office)

## 2021-06-09 NOTE — Evaluation (Signed)
Physical Therapy Evaluation Patient Details Name: Mckenzie Guerrero MRN: [id number] DOB: 01/05/1951 Today's Date: 06/09/2021  History of Present Illness  Pt is a 70 y/o female admitted 11/28 secondary to R weakness and decreased responsiveness. CT showed L MCA infarct. PMH includes HTN, migraines, tremors, recent hernia repair and s/p ACDF.  Clinical Impression  Pt admitted secondary to problem above with deficits below. Pt requiring mod A to perform bed mobility tasks. Pt with R lateral lean requiring at least min A to maintain sitting balance. Pt not following verbal commands this session, but with manual cues, would perform some BLE movement. Pt was previously independent with mobility tasks. Recommending CIR level therapies to address current deficits. Will continue to follow acutely.        Recommendations for follow up therapy are one component of a multi-disciplinary discharge planning process, led by the attending physician.  Recommendations may be updated based on patient status, additional functional criteria and insurance authorization.  Follow Up Recommendations Acute inpatient rehab (3hours/day)    Assistance Recommended at Discharge Frequent or constant Supervision/Assistance  Functional Status Assessment Patient has had a recent decline in their functional status and demonstrates the ability to make significant improvements in function in a reasonable and predictable amount of time.  Equipment Recommendations  Wheelchair (measurements PT);Wheelchair cushion (measurements PT);Hospital bed    Recommendations for Other Services       Precautions / Restrictions Precautions Precautions: Fall Precaution Comments: Pt with recent R hernia repair Restrictions Weight Bearing Restrictions: No      Mobility  Bed Mobility Overal bed mobility: Needs Assistance Bed Mobility: Supine to Sit;Sit to Supine     Supine to sit: Mod assist Sit to supine: Mod assist   General bed  mobility comments: Required assist for trunk and LEs. Increased time required. Upon sitting, pt with R lateral lean and required at least min A to maintain balance. Pt closing eyes in sitting, so further mobility deferred.    Transfers                        Ambulation/Gait                  Stairs            Wheelchair Mobility    Modified Rankin (Stroke Patients Only) Modified Rankin (Stroke Patients Only) Pre-Morbid Rankin Score: No symptoms Modified Rankin: Moderately severe disability     Balance Overall balance assessment: Needs assistance Sitting-balance support: Feet supported Sitting balance-Leahy Scale: Poor Sitting balance - Comments: R lateral lean in sitting, reliant on at least min A.                                     Pertinent Vitals/Pain Pain Assessment: Faces Faces Pain Scale: No hurt    Home Living Family/patient expects to be discharged to:: Private residence Living Arrangements: Alone Available Help at Discharge: Family Type of Home: House Home Access: Stairs to enter Entrance Stairs-Rails: None Entrance Stairs-Number of Steps: 1 Alternate Level Stairs-Number of Steps: flight Home Layout: Two level Home Equipment: None      Prior Function Prior Level of Function : Independent/Modified Independent                     Hand Dominance        Extremity/Trunk Assessment   Upper Extremity Assessment Upper Extremity  Assessment: Difficult to assess due to impaired cognition    Lower Extremity Assessment Lower Extremity Assessment: RLE deficits/detail;Difficult to assess due to impaired cognition RLE Deficits / Details: Pt with non purposeful movement at times. Also would move RLE when PT was assisting. Could not respond to verbal cues to move RLE appropriately. Did not have response to painful stimuli    Cervical / Trunk Assessment Cervical / Trunk Assessment: Kyphotic  Communication    Communication: Expressive difficulties;Receptive difficulties  Cognition Arousal/Alertness: Lethargic Behavior During Therapy: Flat affect Overall Cognitive Status: Difficult to assess                                 General Comments: Pt very lethargic throughout. Would open eyes, but not following verbal commands. When moving LEs, pt would assist with movement in BLE, but could not follow verbal commands to perform independently.        General Comments      Exercises     Assessment/Plan    PT Assessment Patient needs continued PT services  PT Problem List Decreased strength;Decreased activity tolerance;Decreased balance;Decreased mobility;Decreased cognition;Decreased coordination;Decreased knowledge of use of DME;Decreased safety awareness;Decreased knowledge of precautions;Impaired sensation       PT Treatment Interventions DME instruction;Gait training;Functional mobility training;Therapeutic activities;Therapeutic exercise;Balance training;Cognitive remediation;Neuromuscular re-education;Patient/family education    PT Goals (Current goals can be found in the Care Plan section)  Acute Rehab PT Goals Patient Stated Goal: for pt to get better per son PT Goal Formulation: With family Time For Goal Achievement: 06/23/21 Potential to Achieve Goals: Fair    Frequency Min 4X/week   Barriers to discharge        Co-evaluation               AM-PAC PT "6 Clicks" Mobility  Outcome Measure Help needed turning from your back to your side while in a flat bed without using bedrails?: A Lot Help needed moving from lying on your back to sitting on the side of a flat bed without using bedrails?: A Lot Help needed moving to and from a bed to a chair (including a wheelchair)?: A Lot Help needed standing up from a chair using your arms (e.g., wheelchair or bedside chair)?: A Lot Help needed to walk in hospital room?: Total Help needed climbing 3-5 steps with a  railing? : Total 6 Click Score: 10    End of Session   Activity Tolerance: Patient limited by lethargy Patient left: in bed;with call bell/phone within reach;with family/visitor present;with bed alarm set (on stretcher in ED with chair alarm pad under sheet) Nurse Communication: Mobility status PT Visit Diagnosis: Other abnormalities of gait and mobility (R26.89);Muscle weakness (generalized) (M62.81);Difficulty in walking, not elsewhere classified (R26.2);Other symptoms and signs involving the nervous system (C78.938)    Time: 1017-5102 PT Time Calculation (min) (ACUTE ONLY): 19 min   Charges:   PT Evaluation $PT Eval Moderate Complexity: 1 Mod          Reuel Derby, PT, DPT  Acute Rehabilitation Services  Pager: [phone] Office: [phone]   Rudean Hitt 06/09/2021, 1:22 PM

## 2021-06-09 NOTE — ED Notes (Signed)
Report given to Calton Dach, RN of [phone]

## 2021-06-09 NOTE — Progress Notes (Signed)
Bilateral lower extremity venous duplex completed. Refer to "CV Proc" under chart review to view preliminary results.  06/09/2021 4:25 PM Kelby Aline., MHA, RVT, RDCS, RDMS

## 2021-06-09 NOTE — Progress Notes (Addendum)
STROKE TEAM PROGRESS NOTE   ATTENDING NOTE: I reviewed above note and agree with the assessment and plan. Pt was seen and examined.   70 year old female with history of migraine, hypertension, tremor, gastric ulcer admitted for aphasia, right-sided weakness, right facial droop and left gaze.  CT showed left large MCA infarct.  CTA head and neck no LVO, right VA chronic occlusion.  CTP 42 cc core and 56 cc of penumbra.  MRI showed large left MCA infarct with petechial hemorrhage.  EF 60 to 65%.  LE venous Doppler negative for DVT.  LDL 139, A1c pending.  Creatinine 0.50.  WBC 15.4.  On exam, patient somnolent, not open eyes on voice or pain summation.  With forced eye opening, left gaze preference, not able to have right gaze.  Not blinking to visual threat.  Right facial droop, right upper and lower extremity hemiplegia.  Left upper extremity able to against gravity briefly, left lower extremity strong withdraw to pain. Sensation, coordination and gait not tested.  Etiology for patient stroke likely cardioembolic given no clear vascular stenosis/occlusion.  Will consider loop recorder versus 30-day cardiac event monitoring for A. fib evaluation.  She did not pass swallow, currently on aspirin PR 300 and IV fluid.  MRI showed petechial hemorrhagic conversion.  We will repeat CT in a.m.  PT/OT recommend CIR.  We will follow.  For detailed assessment and plan, please refer to above as I have made changes wherever appropriate.   Rosalin Hawking, MD PhD Stroke Neurology 06/09/2021 8:07 PM  I discussed with Dr. Nevada Crane. I spent  35 minutes in total face-to-face time with the patient, more than 50% of which was spent in counseling and coordination of care, reviewing test results, images and medication, and discussing the diagnosis, treatment plan and potential prognosis. This patient's care requiresreview of multiple databases, neurological assessment, discussion with family, other specialists and medical  decision making of high complexity.      INTERVAL HISTORY Pt seen in ED after MRI. Exam highly limited by sedation prior to MRI. Patient's son presented to room during my exam. All questions answered.   Vitals:   06/09/21 0745 06/09/21 0800 06/09/21 0815 06/09/21 0830  BP: (!) 156/90 (!) 159/95 (!) 165/84 (!) 154/92  Pulse: 74 88 72 68  Resp: 17 18 14 15   Temp:      TempSrc:      SpO2: 95% 95% 97% 98%  Weight:      Height:       CBC:  Recent Labs  Lab 06/08/21 1839 06/08/21 1844 06/09/21 0433  WBC 15.4*  --  21.9*  NEUTROABS 12.5*  --   --   HGB 13.0 13.3 14.6  HCT 39.9 39.0 44.7  MCV 93.0  --  91.6  PLT 442*  --  170*   Basic Metabolic Panel:  Recent Labs  Lab 06/08/21 1839 06/08/21 1844  NA 136 138  K 3.5 3.6  CL 105 107  CO2 23  --   GLUCOSE 125* 121*  BUN 10 10  CREATININE 0.61 0.50  CALCIUM 8.7*  --      Lipid Panel:  Recent Labs  Lab 06/09/21 0433  CHOL 200  TRIG 75  HDL 46  CHOLHDL 4.3  VLDL 15  LDLCALC 139*    HgbA1c: No results for input(s): HGBA1C in the last 168 hours. Urine Drug Screen: No results for input(s): LABOPIA, COCAINSCRNUR, LABBENZ, AMPHETMU, THCU, LABBARB in the last 168 hours.  Alcohol Level No results for  input(s): ETH in the last 168 hours.  IMAGING past 24 hours DG CHEST PORT 1 VIEW  Result Date: 06/08/2021 CLINICAL DATA:  Leukocytosis, code stroke. EXAM: PORTABLE CHEST 1 VIEW COMPARISON:  Chest x-ray 04/22/2014. FINDINGS: There is a very large hiatal hernia which has increased in size compared to the prior study. The lungs are clear. There is no pleural effusion or pneumothorax. Cardiomediastinal silhouette is grossly unchanged. Cervical spinal fusion plate is present. No acute fractures are seen. IMPRESSION: 1. Very large hiatal hernia which has increased in size compared to the prior study. 2. The lungs are clear. Electronically Signed   By: Ronney Asters M.D.   On: 06/08/2021 22:58   CT HEAD CODE STROKE WO  CONTRAST  Result Date: 06/08/2021 CLINICAL DATA:  Code stroke.  Right-sided weakness EXAM: CT HEAD WITHOUT CONTRAST TECHNIQUE: Contiguous axial images were obtained from the base of the skull through the vertex without intravenous contrast. COMPARISON:  CT head 04/19/2010 FINDINGS: Brain: There is confluent hypodensity throughout the left MCA territory consistent with acute to early subacute infarct. There is no evidence of hemorrhagic transformation. There is swelling of the involved gyri with effacement of the sulci but no midline shift. There is no evidence of acute intracranial hemorrhage. The ventricles are normal in size. There is no solid mass lesion. Vascular: No definite dense vessel sign is seen. Skull: Normal. Negative for fracture or focal lesion. Sinuses/Orbits: The paranasal sinuses are clear. The globes and orbits are unremarkable. Other: None. ASPECTS [facility] Stroke Program Early CT Score) - Ganglionic level infarction (caudate, lentiform nuclei, internal capsule, insula, M1-M3 cortex): 3 - Supraganglionic infarction (M4-M6 cortex): 1 (M5 appears to be spared) Total score (0-10 with 10 being normal): 4 IMPRESSION: 1. Acute to early subacute left MCA territorial infarct without evidence of hemorrhagic transformation. 2. ASPECTS is 4 These results were called by telephone at the time of interpretation on 06/08/2021 at 6:56 pm to provider Dr Cheral Marker, who verbally acknowledged these results. Electronically Signed   By: Valetta Mole M.D.   On: 06/08/2021 19:05   CT ANGIO HEAD NECK W WO CM W PERF (CODE STROKE)  Result Date: 06/08/2021 CLINICAL DATA:  Acute neurologic deficit EXAM: CT ANGIOGRAPHY HEAD AND NECK CT PERFUSION BRAIN TECHNIQUE: Multidetector CT imaging of the head and neck was performed using the standard protocol during bolus administration of intravenous contrast. Multiplanar CT image reconstructions and MIPs were obtained to evaluate the vascular anatomy. Carotid stenosis  measurements (when applicable) are obtained utilizing NASCET criteria, using the distal internal carotid diameter as the denominator. Multiphase CT imaging of the brain was performed following IV bolus contrast injection. Subsequent parametric perfusion maps were calculated using RAPID software. CONTRAST:  142mL OMNIPAQUE IOHEXOL 350 MG/ML SOLN COMPARISON:  Head CT 06/08/2021 FINDINGS: CTA NECK FINDINGS SKELETON: There is no bony spinal canal stenosis. No lytic or blastic lesion. OTHER NECK: Normal pharynx, larynx and major salivary glands. No cervical lymphadenopathy. Unremarkable thyroid gland. UPPER CHEST: No pneumothorax or pleural effusion. No nodules or masses. AORTIC ARCH: There is calcific atherosclerosis of the aortic arch. There is no aneurysm, dissection or hemodynamically significant stenosis of the visualized portion of the aorta. Conventional 3 vessel aortic branching pattern. The visualized proximal subclavian arteries are widely patent. RIGHT CAROTID SYSTEM: Normal without aneurysm, dissection or stenosis. LEFT CAROTID SYSTEM: Normal without aneurysm, dissection or stenosis. VERTEBRAL ARTERIES: Left dominant configuration. 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 Left vertebral artery is normal. CTA HEAD  FINDINGS POSTERIOR CIRCULATION: --Vertebral arteries: Normal V4 segments. --Inferior cerebellar arteries: Normal. --Basilar artery: Normal. --Superior cerebellar arteries: Normal. --Posterior cerebral arteries (PCA): Normal. ANTERIOR CIRCULATION: --Intracranial internal carotid arteries: Normal. --Anterior cerebral arteries (ACA): Normal. Both A1 segments are present. Patent anterior communicating artery (a-comm). --Middle cerebral arteries (MCA): Normal. VENOUS SINUSES: As permitted by contrast timing, patent. ANATOMIC VARIANTS: None Review of the MIP images confirms the above findings. CT Brain Perfusion Findings: ASPECTS: 4 CBF (<30%) Volume: 62mL Perfusion (Tmax>6.0s)  volume: 85mL Mismatch Volume: 23mL Infarction Location:Left MCA territory IMPRESSION: 1. No intracranial large vessel occlusion. 2. 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 3. 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 Aortic Atherosclerosis (ICD10-I70.0). Electronically Signed   By: Ulyses Jarred M.D.   On: 06/08/2021 19:23    PHYSICAL EXAM   Mental Status: Awake with decreased level of alertness. Dense expressive and receptive aphasia. Unable to follow any commands. Mute.  Cranial Nerves: II: No blink to threat on the right. Blinks to threat on the left. PERRL.  III,IV, VI: Leftward gaze. Does not volitionally cross to the right but can be overcome with oculocephalic reflex. No nystagmus.  V: No reaction to stimulation on the right.  VII: Right facial droop.  VIII: Attends to some left sided vocal stimuli. .  IX,X: Deferred gag reflex. Does not open mouth for assessment of palate/uvula.  XI: Head is turned to the left.  XII: Does not protrude tongue Motor/Sensory: RUE with no movement to any stimuli; drifts within 1 second to bed when elevated and released; some tone is present. RLE: Weak withdrawal to noxious rated as 2/5 LUE and LLE: Move normally to noxious stimuli Deep Tendon Reflexes: Unremarkable Plantars: Equivocal bilaterally  Cerebellar: Unable to assess in the context of inability to follow commands.  Gait: Unable to assess   Lab Results: ASSESSMENT/PLAN Ms. CHANI GHANEM is a 70 y.o. female with history of migraine headaches, anxiety, gleeding ulcers, HTN and tremor, presenting to the ED via EMS as a Code Stroke after family found her in her recliner at home with aphasia, right hemiplegia, right facial droop and left gaze preference. LKN per family was 0900 when they spoke with her on the phone. Later in the day family was unable to contact her by phone and she was also not answering her door, so a family  member went to the house and opened the door with a spare key to check on her. When he entered, he found her as above and EMS was called. EMS noted the same on scene. Code Stroke was called in the field. On arrival to the ED her condition was unchanged. No obvious signs of trauma. STAT CT head was obtained.    Stroke:   of left middle cerebral artery infarct   likely secondary due to thrombosis  Code Stroke  CT head : acute to early subacute left MCA territorial infarct without hemorrhagic transformation. Aspects 4.  CTA head & neck   1. No intracranial large vessel occlusion. 2. 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  CT perfusion   3. 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  MRI  BRAIN: Evolving left MCA territorial infarct with evidence of hemorrhagic transformation and trace rightward midline shift. 2D Echo results pending    LDL 139 HgbA1c No results found for requested labs within last 26280  hours. VTE prophylaxis -  scd     Diet   Diet NPO time specified   No antithrombotic prior to admission, now on aspirin 300 mg suppository daily.   Therapy recommendations:  pending Disposition:  pending   Hypertension Home meds:  ramipril Unstable Permissive hypertension (OK if < 220/120) but gradually normalize in 5-7 days Long-term BP goal normotensive  Hyperlipidemia Home meds:  none LDL 139, goal < 70 Add lipitor   High intensity statin   Continue statin at discharge    HgbA1c No results found for requested labs within last 26280 hours., goal < 7.0 CBGs Recent Labs    06/08/21 1838  GLUCAP 122*    SSI  Other Stroke Risk Factors  Advanced Age >/= 52  Migraines     Other Active Problems Leukocytosis  WBC 15.4.  No fever or tachycardia to suggest sepsis.  Recently underwent laparoscopic Nissen fundoplication for hiatal hernia 2 weeks ago, no obvious signs of infection at surgical incision  site.  Abdominal exam benign.  Hospital day # 1

## 2021-06-09 NOTE — ED Notes (Signed)
Pt is resting intermittently in bed with bed alarm in place. Purewick is well-positioned with minimal urine output; IV fluids currently infusing at 87mL/hr. No reaction to threat towards right eye noted, unchanged since arrival to ED. Pt has attempted to make a few sounds in response to speech but has not verbalized any recognizable words. No apparent sensation to right lower leg but she does react to touch on bilateral feet, left greater than right. Pt is able to move all extremities but has no or minimal effort against gravity on the right side. See charted mNIHSS assessment scores.

## 2021-06-09 NOTE — Progress Notes (Signed)
Pt arrived to 3W16 at this time.  Responds to pain.  Restless.  Placed on progressive monitor, CCMD called and second verification completed.  Bed alarm set.  No family at bedside.

## 2021-06-09 NOTE — Progress Notes (Signed)
SLP Cancellation Note  Patient Details Name: Mckenzie Guerrero MRN: [id number] DOB: 1950-07-28   Cancelled treatment:       Reason Eval/Treat Not Completed: Patient's level of consciousness. Pt sedated and about to travel to MRI.    Maridee Slape, Katherene Ponto 06/09/2021, 10:33 AM

## 2021-06-09 NOTE — ED Notes (Signed)
Pt's family member took pt's bottom dentures home

## 2021-06-09 NOTE — Progress Notes (Addendum)
PROGRESS NOTE  Mckenzie Mckenzie Guerrero WYO:[id number] DOB: 05-04-1951 DOA: 06/08/2021 PCP: Mckenzie Mina., MD  HPI/Recap of past 24 hours: Mckenzie Mckenzie Guerrero is a 70 y.o. female with medical history significant of migraine headaches, bleeding ulcers, hypertension, tremors, hiatal hernia and GERD status post recent laparoscopic Nissen fundoplication on 77/41/2878.  She presents to [facility] ED on 06/08/2021 from home via EMS as code stroke after family found her in her recliner at home with aphasia, right hemiplegia, right facial droop, and left gaze preference. LKN 0900 per family when they spoke to her on the phone.  No prior history of TIA or CVA.  Not on aspirin prior to presentation.  Code stroke called in the field.  Upon presentation to the ED, work-up revealed a large left MCA territory ischemic infarction.  No intracranial large vessel occlusion.  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  She was out of window for IV tPA at the time of presentation, past 4.5 hours and she was not a thrombectomy candidate due to large completed stroke with aspects of 3.  Seen by stroke team.  Admitted for stroke work-up.  06/09/2021: Patient was seen and examined at her bedside in the ED.  She is somnolent but arousable to voices and stimuli.  She has some difficulty following commands.  She has persistent right-sided weakness.  One of her twin sons was present in the room.  She has 3 sons.    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 Code stroke called in the field.   CT head showed large left MCA territory ischemic infarction.   No  intracranial large vessel occlusion seen on CTA head and neck.  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   She was out of window for IV tPA at the time of presentation, past 4.5 hours and she was not a thrombectomy candidate due to large completed stroke with aspects of 3.   MRI brain showing petechial hemorrhage at the site of the acute CVA Discussed with neurology, continue aspirin 300 mg per rectum or 81 mg daily p.o. if passes swallow evaluation. Goal SBP less than 160, IV labetalol as needed with parameters. LDL 139, goal less than 70, continue high intensity statin, Lipitor 80 mg daily, if she passes a swallow evaluation Hemoglobin A1c, pending, goal less than 7.0. Continue frequent neurochecks 2D echo pending, follow results PT/OT/speech therapist assessment pending Will likely require therapies Aspiration/fall precautions. Continue to monitor on telemetry, currently in sinus rhythm. Repeat CT head without contrast in the morning due to hemorrhagic conversion, follow results.  Hyperlipidemia LDL 139 Goal LDL less than 70 High intensity statin, Lipitor 80 mg daily after she passes her swallow evaluation.  Hyperglycemia Follow hemoglobin A1c Monitor for now and avoid hypoglycemia She is n.p.o. until she passes her swallow evaluation Currently on LR D5W at 50 cc/h x 2 days until she passes her swallow evaluation. Monitor CBGs every 6 hours  Leukocytosis, suspect reactive in the setting of acute stroke UA negative for pyuria Chest x-ray, personally reviewed  shows very large hiatal hernia which has increased in size compared to the prior study The lungs are clear Monitor WBC and monitor for fever  Repeat CBC in the morning  Thrombocytosis, likely reactive Platelet count 515K Repeat CBC in the morning  History of hypertension Home oral antihypertensive, ramipril  on hold. Continue to closely monitor vital signs  History of migraines Hold off home regimen for now Closely monitor for now.     Critical care time: 55 minutes.    Code Status: Full code  Family Communication: Son at bedside  Disposition Plan: Likely will discharge to rehab   Consultants: Neurology  Procedures: None  Antimicrobials: None  DVT prophylaxis: SCDs  Status is: Inpatient  Inpatient status.  Patient will require at least 2 midnights for further evaluation and treatment of present condition.      Objective: Vitals:   06/09/21 1000 06/09/21 1007 06/09/21 1015 06/09/21 1115  BP: (!) 152/85  (!) 145/88 (!) 157/80  Pulse: 71 90 66 74  Resp: (!) 22 17 16 19   Temp:  99.8 F (37.7 C)    TempSrc:  Rectal    SpO2: 96% 99% 97% 96%  Weight:      Height:        Intake/Output Summary (Last 24 hours) at 06/09/2021 1136 Last data filed at 06/09/2021 0759 Gross per 24 hour  Intake 668.24 ml  Output --  Net 668.24 ml   Filed Weights   06/08/21 1842 06/08/21 1922  Weight: 61.4 kg 61.4 kg    Exam:  General: 70 y.o. year-old female well developed well nourished in no acute distress.  Somnolent but arousable to voices and stimuli. Cardiovascular: Regular rate and rhythm with no rubs or gallops.  No thyromegaly or JVD noted.   Respiratory: Clear to auscultation with no wheezes or rales. Good inspiratory effort. Abdomen: Soft nontender nondistended with normal bowel sounds x4 quadrants. Musculoskeletal: No lower extremity edema. 2/4 pulses in all 4 extremities. Skin: No ulcerative lesions noted or rashes Psychiatry: Mood is appropriate for condition and setting Neuro: Not following commands.  Notable right sided weakness.   Data Reviewed: CBC: Recent Labs  Lab 06/08/21 1839 06/08/21 1844 06/09/21 0433  WBC 15.4*  --  21.9*  NEUTROABS 12.5*  --   --   HGB 13.0 13.3 14.6  HCT 39.9 39.0 44.7  MCV 93.0  --  91.6  PLT 442*  --  515*   Mckenzie Guerrero  Metabolic Panel: Recent Labs  Lab 06/08/21 1839 06/08/21 1844  NA 136 138  K 3.5 3.6  CL 105 107  CO2 23  --   GLUCOSE 125* 121*  BUN 10 10  CREATININE 0.61 0.50  CALCIUM 8.7*  --    GFR: Estimated Creatinine Clearance: 58.9 mL/min (by C-G formula based on SCr of 0.5 mg/dL). Liver Function Tests: Recent Labs  Lab 06/08/21 1839  AST 21  ALT 20  ALKPHOS 77  BILITOT 0.5  PROT 6.3*  ALBUMIN 3.5   No results for input(s): LIPASE, AMYLASE in the last 168 hours. No results for input(s): AMMONIA in the last 168 hours. Coagulation Profile: Recent Labs  Lab 06/08/21 1839  INR 1.1   Cardiac Enzymes: No results for input(s): CKTOTAL, CKMB, CKMBINDEX, TROPONINI in the last 168 hours. BNP (last 3 results) No results for input(s): PROBNP in the last 8760 hours. HbA1C: No results for input(s): HGBA1C in the last 72 hours. CBG: Recent Labs  Lab 06/08/21 1838  GLUCAP 122*   Lipid  Profile: Recent Labs    06/09/21 0433  CHOL 200  HDL 46  LDLCALC 139*  TRIG 75  CHOLHDL 4.3   Thyroid Function Tests: No results for input(s): TSH, T4TOTAL, FREET4, T3FREE, THYROIDAB in the last 72 hours. Anemia Panel: No results for input(s): VITAMINB12, FOLATE, FERRITIN, TIBC, IRON, RETICCTPCT in the last 72 hours. Urine analysis:    Component Value Date/Time   COLORURINE YELLOW 06/08/2021 2226   APPEARANCEUR CLOUDY (A) 06/08/2021 2226   LABSPEC >1.046 (H) 06/08/2021 2226   PHURINE 9.0 (H) 06/08/2021 2226   GLUCOSEU NEGATIVE 06/08/2021 2226   HGBUR NEGATIVE 06/08/2021 2226   BILIRUBINUR NEGATIVE 06/08/2021 2226   KETONESUR 5 (A) 06/08/2021 2226   PROTEINUR NEGATIVE 06/08/2021 2226   NITRITE NEGATIVE 06/08/2021 2226   LEUKOCYTESUR NEGATIVE 06/08/2021 2226   Sepsis Labs: @LABRCNTIP (procalcitonin:4,lacticidven:4)  ) Recent Results (from the past 240 hour(s))  Resp Panel by RT-PCR (Flu A&B, Covid) Urine, Clean Catch     Status: None   Collection Time: 06/08/21 10:45 PM    Specimen: Urine, Clean Catch; Nasopharyngeal(NP) swabs in vial transport medium  Result Value Ref Range Status   SARS Coronavirus 2 by RT PCR NEGATIVE NEGATIVE Final    Comment: (NOTE) SARS-CoV-2 target nucleic acids are NOT DETECTED.  The SARS-CoV-2 RNA is generally detectable in upper respiratory specimens during the acute phase of infection. The lowest concentration of SARS-CoV-2 viral copies this assay can detect is 138 copies/mL. A negative result does not preclude SARS-Cov-2 infection and should not be used as the sole basis for treatment or other patient management decisions. A negative result may occur with  improper specimen collection/handling, submission of specimen other than nasopharyngeal swab, presence of viral mutation(s) within the areas targeted by this assay, and inadequate number of viral copies(<138 copies/mL). A negative result must be combined with clinical observations, patient history, and epidemiological information. The expected result is Negative.  Fact Sheet for Patients:  EntrepreneurPulse.com.au  Fact Sheet for Healthcare Providers:  IncredibleEmployment.be  This test is no t yet approved or cleared by the [place] FDA and  has been authorized for detection and/or diagnosis of SARS-CoV-2 by FDA under an Emergency Use Authorization (EUA). This EUA will remain  in effect (meaning this test can be used) for the duration of the COVID-19 declaration under Section 564(b)(1) of the Act, 21 U.S.C.section 360bbb-3(b)(1), unless the authorization is terminated  or revoked sooner.       Influenza A by PCR NEGATIVE NEGATIVE Final   Influenza B by PCR NEGATIVE NEGATIVE Final    Comment: (NOTE) The Xpert Xpress SARS-CoV-2/FLU/RSV plus assay is intended as an aid in the diagnosis of influenza from Nasopharyngeal swab specimens and should not be used as a sole basis for treatment. Nasal washings and aspirates are  unacceptable for Xpert Xpress SARS-CoV-2/FLU/RSV testing.  Fact Sheet for Patients: EntrepreneurPulse.com.au  Fact Sheet for Healthcare Providers: IncredibleEmployment.be  This test is not yet approved or cleared by the [place] FDA and has been authorized for detection and/or diagnosis of SARS-CoV-2 by FDA under an Emergency Use Authorization (EUA). This EUA will remain in effect (meaning this test can be used) for the duration of the COVID-19 declaration under Section 564(b)(1) of the Act, 21 U.S.C. section 360bbb-3(b)(1), unless the authorization is terminated or revoked.  Performed at [facility], [place] [address] [place], [place] [address]       Studies: MR BRAIN WO CONTRAST  Result Date: 06/09/2021 CLINICAL DATA:  Stroke, follow-up, aphasia, right  hemiplegia, right facial droop EXAM: MRI HEAD WITHOUT CONTRAST TECHNIQUE: Multiplanar, multiecho pulse sequences of the brain and surrounding structures were obtained without intravenous contrast. COMPARISON:  CT/CTA head and neck dated 1 day prior FINDINGS: Brain: There is extensive diffusion restriction throughout the left MCA territory consistent with evolving early subacute MCA territory infarct there is significant T2* signal dropout consistent with associated hemorrhage. Edema from the infarct results in diffuse sulcal effacement, partial effacement of the left lateral ventricle, and minimal rightward midline shift. There is no other infarct. The ventricles are otherwise normal in size and configuration. There is no solid mass lesion. Vascular: The right V4 segment flow void is diminutive common better evaluated on the CTA from 1 day prior. The other major flow voids are present. Skull and upper cervical spine: Normal marrow signal. Sinuses/Orbits: Paranasal sinuses are clear. The globes and orbits are unremarkable. Other: None. IMPRESSION: Evolving left MCA territorial infarct with  evidence of hemorrhagic transformation and trace rightward midline shift. These results were called by telephone at the time of interpretation on 06/09/2021 at 11:21 am to provider Dr. Nevada Crane, who verbally acknowledged these results. Electronically Signed   By: Valetta Mole M.D.   On: 06/09/2021 11:22   DG CHEST PORT 1 VIEW  Result Date: 06/08/2021 CLINICAL DATA:  Leukocytosis, code stroke. EXAM: PORTABLE CHEST 1 VIEW COMPARISON:  Chest x-ray 04/22/2014. FINDINGS: There is a very large hiatal hernia which has increased in size compared to the prior study. The lungs are clear. There is no pleural effusion or pneumothorax. Cardiomediastinal silhouette is grossly unchanged. Cervical spinal fusion plate is present. No acute fractures are seen. IMPRESSION: 1. Very large hiatal hernia which has increased in size compared to the prior study. 2. The lungs are clear. Electronically Signed   By: Ronney Asters M.D.   On: 06/08/2021 22:58   CT HEAD CODE STROKE WO CONTRAST  Result Date: 06/08/2021 CLINICAL DATA:  Code stroke.  Right-sided weakness EXAM: CT HEAD WITHOUT CONTRAST TECHNIQUE: Contiguous axial images were obtained from the base of the skull through the vertex without intravenous contrast. COMPARISON:  CT head 04/19/2010 FINDINGS: Brain: There is confluent hypodensity throughout the left MCA territory consistent with acute to early subacute infarct. There is no evidence of hemorrhagic transformation. There is swelling of the involved gyri with effacement of the sulci but no midline shift. There is no evidence of acute intracranial hemorrhage. The ventricles are normal in size. There is no solid mass lesion. Vascular: No definite dense vessel sign is seen. Skull: Normal. Negative for fracture or focal lesion. Sinuses/Orbits: The paranasal sinuses are clear. The globes and orbits are unremarkable. Other: None. ASPECTS [facility] Stroke Program Early CT Score) - Ganglionic level infarction (caudate, lentiform  nuclei, internal capsule, insula, M1-M3 cortex): 3 - Supraganglionic infarction (M4-M6 cortex): 1 (M5 appears to be spared) Total score (0-10 with 10 being normal): 4 IMPRESSION: 1. Acute to early subacute left MCA territorial infarct without evidence of hemorrhagic transformation. 2. ASPECTS is 4 These results were called by telephone at the time of interpretation on 06/08/2021 at 6:56 pm to provider Dr Cheral Marker, who verbally acknowledged these results. Electronically Signed   By: Valetta Mole M.D.   On: 06/08/2021 19:05   CT ANGIO HEAD NECK W WO CM W PERF (CODE STROKE)  Result Date: 06/08/2021 CLINICAL DATA:  Acute neurologic deficit EXAM: CT ANGIOGRAPHY HEAD AND NECK CT PERFUSION BRAIN TECHNIQUE: Multidetector CT imaging of the head and neck was performed using the standard protocol  during bolus administration of intravenous contrast. Multiplanar CT image reconstructions and MIPs were obtained to evaluate the vascular anatomy. Carotid stenosis measurements (when applicable) are obtained utilizing NASCET criteria, using the distal internal carotid diameter as the denominator. Multiphase CT imaging of the brain was performed following IV bolus contrast injection. Subsequent parametric perfusion maps were calculated using RAPID software. CONTRAST:  178mL OMNIPAQUE IOHEXOL 350 MG/ML SOLN COMPARISON:  Head CT 06/08/2021 FINDINGS: CTA NECK FINDINGS SKELETON: There is no bony spinal canal stenosis. No lytic or blastic lesion. OTHER NECK: Normal pharynx, larynx and major salivary glands. No cervical lymphadenopathy. Unremarkable thyroid gland. UPPER CHEST: No pneumothorax or pleural effusion. No nodules or masses. AORTIC ARCH: There is calcific atherosclerosis of the aortic arch. There is no aneurysm, dissection or hemodynamically significant stenosis of the visualized portion of the aorta. Conventional 3 vessel aortic branching pattern. The visualized proximal subclavian arteries are widely patent. RIGHT CAROTID  SYSTEM: Normal without aneurysm, dissection or stenosis. LEFT CAROTID SYSTEM: Normal without aneurysm, dissection or stenosis. VERTEBRAL ARTERIES: Left dominant configuration. 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 Left vertebral artery is normal. CTA HEAD FINDINGS POSTERIOR CIRCULATION: --Vertebral arteries: Normal V4 segments. --Inferior cerebellar arteries: Normal. --Basilar artery: Normal. --Superior cerebellar arteries: Normal. --Posterior cerebral arteries (PCA): Normal. ANTERIOR CIRCULATION: --Intracranial internal carotid arteries: Normal. --Anterior cerebral arteries (ACA): Normal. Both A1 segments are present. Patent anterior communicating artery (a-comm). --Middle cerebral arteries (MCA): Normal. VENOUS SINUSES: As permitted by contrast timing, patent. ANATOMIC VARIANTS: None Review of the MIP images confirms the above findings. CT Brain Perfusion Findings: ASPECTS: 4 CBF (<30%) Volume: 20mL Perfusion (Tmax>6.0s) volume: 69mL Mismatch Volume: 39mL Infarction Location:Left MCA territory IMPRESSION: 1. No intracranial large vessel occlusion. 2. 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 3. 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 Aortic Atherosclerosis (ICD10-I70.0). Electronically Signed   By: Ulyses Jarred M.D.   On: 06/08/2021 19:23    Scheduled Meds:   stroke: mapping our early stages of recovery book   Does not apply Once   aspirin  300 mg Rectal Daily   atorvastatin  80 mg Oral Daily    Continuous Infusions:  sodium chloride 75 mL/hr at 06/09/21 0800   dextrose 5% lactated ringers       LOS: 1 day     Kayleen Memos, MD [facility] Pager [phone]  If 7PM-7AM, please contact night-coverage [email or web address] Password [facility] 06/09/2021, 11:36 AM

## 2021-06-10 ENCOUNTER — Inpatient Hospital Stay (HOSPITAL_COMMUNITY): Payer: PPO

## 2021-06-10 DIAGNOSIS — E785 Hyperlipidemia, unspecified: Secondary | ICD-10-CM

## 2021-06-10 DIAGNOSIS — Z8669 Personal history of other diseases of the nervous system and sense organs: Secondary | ICD-10-CM

## 2021-06-10 DIAGNOSIS — Z8719 Personal history of other diseases of the digestive system: Secondary | ICD-10-CM

## 2021-06-10 DIAGNOSIS — D75839 Thrombocytosis, unspecified: Secondary | ICD-10-CM

## 2021-06-10 LAB — GLUCOSE, CAPILLARY
Glucose-Capillary: 123 mg/dL — ABNORMAL HIGH (ref 70–99)
Glucose-Capillary: 133 mg/dL — ABNORMAL HIGH (ref 70–99)

## 2021-06-10 MED ORDER — HEPARIN SODIUM (PORCINE) 5000 UNIT/ML IJ SOLN
5000.0000 [IU] | Freq: Three times a day (TID) | INTRAMUSCULAR | Status: DC
  Administered 2021-06-10 – 2021-06-13 (×9): 5000 [IU] via SUBCUTANEOUS
  Filled 2021-06-10 (×9): qty 1

## 2021-06-10 MED ORDER — ASPIRIN 81 MG PO CHEW
81.0000 mg | CHEWABLE_TABLET | Freq: Every day | ORAL | Status: DC
  Administered 2021-06-10 – 2021-06-13 (×4): 81 mg via ORAL
  Filled 2021-06-10 (×4): qty 1

## 2021-06-10 NOTE — TOC Initial Note (Signed)
Transition of Care [facility]) - Initial/Assessment Note    Patient Details  Name: Mckenzie Guerrero MRN: [id number] Date of Birth: July 20, 1950  Transition of Care [facility]) CM/SW Contact:    Mckenzie Guerrero, [place] Phone Number: 06/10/2021, 2:56 PM  Clinical Narrative:                 CSW received consult for possible SNF placement at time of discharge of CIR is not able to accept patient. CSW spoke with patient sons at bedside. Family expressed understanding of PT recommendation and may be agreeable to SNF placement at time of discharge if CIR is unable to accept patient. No further questions reported at this time and CSW will continue to follow for medical readiness.   Skilled Nursing Rehab Facilities-   [email or web address]   Ratings out of 5 possible   Name Address  Phone # [employer] Ins[employer] Overall  [facility] [address] [place] 5 5 2 4   [facility]  [address], [place] [phone] 4 2 5 5   [facility] [place], [place] 4 1 1 1   [place], [place] 2 2 4 4   [facility] [address] [place] 2 1 1 1   [place] 3 1 4 3   [facility] [address], [place] 5 2 2 3   [facility] [address] [place] 4 1 2 1   [place] [facility] [place] 5 1 2 2   [facility] [phone], [place] [phone] 4 1 1 1   [facility] [address] [facility] [phone] 4 1 2 1   [place] [address] [place] 4 1 1 1       Expected Discharge Plan: IP Rehab Facility Barriers to Discharge: Continued Medical Work up, [phone]   Patient Goals and CMS Choice Patient states their goals for this  hospitalization and ongoing recovery are:: Rehab CMS Medicare.gov Compare Post Acute Care list provided to:: Patient Represenative (must comment) Choice offered to / list presented to : Adult Children  Expected Discharge Plan and Services Expected Discharge Plan: [place] In-house Referral: Clinical Social Work   Post Acute Care Choice: IP Rehab Living arrangements for the past 2 months: Single Family Home                                      Prior Living Arrangements/Services Living arrangements for the past 2 months: Single Family Home Lives with:: Self Patient language and need for interpreter reviewed:: Yes Do you feel safe going back to the place where you live?: Yes      Need for Family Participation in Patient Care: Yes (Comment) Care giver support system in place?: Yes (comment)   Criminal Activity/Legal Involvement Pertinent to Current Situation/Hospitalization: No - Comment as needed  Activities of Daily Living      Permission Sought/Granted Permission sought to share information with : Facility [phone], Family Supports Permission granted to share information with : Yes, Verbal Permission Granted  Share Information with NAME: Sport and exercise psychologist  Permission granted to share info w AGENCY: SNFs  Permission granted to share info w Relationship: Son  Permission granted to share info w Contact Information: [phone]  Emotional Assessment Appearance:: Appears stated age  Attitude/Demeanor/Rapport: Unable to Assess Affect (typically observed): Unable to Assess Orientation: :  (Aphasia) Alcohol / Substance Use: Not Applicable Psych Involvement: No (comment)  Admission diagnosis:  Leukocytosis [D72.829] Acute ischemic stroke [facility]) [I63.9] Acute CVA (cerebrovascular accident) [facility]) [I63.9] Patient Active Problem List   Diagnosis Date Noted   Acute CVA (cerebrovascular accident) ([place]) 06/08/2021   HTN (hypertension) 06/08/2021   Leukocytosis  06/08/2021   Status post laparoscopic Nissen fundoplication 51/83/4373   Cervical spondylosis with myelopathy and radiculopathy 09/28/2019   PCP:  Raina Mina., MD Pharmacy:   [facility] [address] [place] [address] [place] [address] Phone: [phone] Fax: [phone]     Social Determinants of Health (SDOH) Interventions    Readmission Risk Interventions No flowsheet data found.

## 2021-06-10 NOTE — Progress Notes (Signed)
Inpatient Rehab Admissions Coordinator:   Per therapy recommendations,  patient was screened for CIR candidacy by Clemens Catholic, MS, CCC-SLP. At this time, Pt. Appears to be a a potential candidate for CIR. I will place  order for rehab consult per protocol for full assessment. Note that Pt. Was living alone PTA, and family will need to be able to provide 24/7 support at d/c in order to be considered for CIR. Please contact me any with questions.  Clemens Catholic, [place], [place] Admissions Coordinator  [phone] ([place]) [phone] (office)

## 2021-06-10 NOTE — Progress Notes (Signed)
Physical Therapy Treatment Patient Details Name: Mckenzie Guerrero MRN: [id number] DOB: Mar 25, 1951 Today's Date: 06/10/2021   History of Present Illness Pt is a 70 y/o female admitted 11/28 secondary to R weakness and decreased responsiveness. CT showed L MCA infarct. PMH includes HTN, migraines, tremors, recent hernia repair and s/p ACDF.    PT Comments    Patient progressing this session able to stand, pivot to recliner and walk in the room with 2 assist.  She was nonverbal and requires extensive cues for participating in mobility.  She remains appropriate for acute inpatient rehab and family seems willing to work out plan for care even if needed long term.  PT will continue to follow acutely.     Recommendations for follow up therapy are one component of a multi-disciplinary discharge planning process, led by the attending physician.  Recommendations may be updated based on patient status, additional functional criteria and insurance authorization.  Follow Up Recommendations  Acute inpatient rehab (3hours/day)     Assistance Recommended at Discharge Frequent or constant Supervision/Assistance  Equipment Recommendations  Wheelchair (measurements PT);Wheelchair cushion (measurements PT);Hospital bed    Recommendations for Other Services       Precautions / Restrictions Precautions Precautions: Fall Precaution Comments: Pt with recent R hernia repairl R inattention, aphasic     Mobility  Bed Mobility Overal bed mobility: Needs Assistance Bed Mobility: Supine to Sit     Supine to sit: Mod assist;HOB elevated     General bed mobility comments: assist for initiation to bring legs off bed and for competing righting of her trunk    Transfers Overall transfer level: Needs assistance Equipment used: 2 person hand held assist Transfers: Sit to/from Stand;Bed to chair/wheelchair/BSC Sit to Stand: Mod assist;+2 physical assistance     Step pivot transfers: Mod assist;+2  safety/equipment     General transfer comment: Patient impulsive needing cues and hand for waiting to transfer to chair as she saw it on her L and immediately initiating movement; stood from chair to sink with +2 A for anterior weight shift to engage LE's; then stood again prior to ambulation in the room    Ambulation/Gait Ambulation/Gait assistance: Mod assist;+2 physical assistance Gait Distance (Feet): 6 Feet Assistive device: 2 person hand held assist Gait Pattern/deviations: Step-to pattern;Narrow base of support;Trunk flexed;Shuffle;Scissoring       General Gait Details: increased R side weight shift with facilitation to weight shift L for R foot progression, patient stepping more to R with L foot increaseing narrow BOS and eventualy scissoring   Stairs             Wheelchair Mobility    Modified Rankin (Stroke Patients Only) Modified Rankin (Stroke Patients Only) Pre-Morbid Rankin Score: No symptoms Modified Rankin: Moderately severe disability     Balance Overall balance assessment: Needs assistance Sitting-balance support: Feet supported Sitting balance-Leahy Scale: Poor Sitting balance - Comments: initially leaning R, then sitting with S after feet on floor Postural control: Right lateral lean Standing balance support: Bilateral upper extremity supported Standing balance-Leahy Scale: Poor Standing balance comment: assist for balance with bilateral UE support                            Cognition Arousal/Alertness: Lethargic Behavior During Therapy: Flat affect;Impulsive Overall Cognitive Status: Impaired/Different from baseline Area of Impairment: Attention;Following commands;Safety/judgement                   Current Attention Level:  Focused   Following Commands: Follows one step commands inconsistently Safety/Judgement: Decreased awareness of safety;Decreased awareness of deficits     General Comments: limited assessment due to  aphasia, patient able to follow commands with physical assist and max verbal/tactile and gestureing cues; three sons in the room and report patient completely independent prior        Exercises      General Comments General comments (skin integrity, edema, etc.): sons in the room; educated on rehab options and they continue to feel they will work out situation for her at d/c even if shorter LOS at acute inpatient rehab.      Pertinent Vitals/Pain Pain Assessment: Faces Faces Pain Scale: No hurt    Home Living                          Prior Function            PT Goals (current goals can now be found in the care plan section) Progress towards PT goals: Progressing toward goals    Frequency    Min 4X/week      PT Plan Current plan remains appropriate    Co-evaluation PT/OT/SLP Co-Evaluation/Treatment: Yes Reason for Co-Treatment: To address functional/ADL transfers;For patient/therapist safety PT goals addressed during session: Balance;Mobility/safety with mobility        AM-PAC PT "6 Clicks" Mobility   Outcome Measure  Help needed turning from your back to your side while in a flat bed without using bedrails?: A Lot Help needed moving from lying on your back to sitting on the side of a flat bed without using bedrails?: A Lot Help needed moving to and from a bed to a chair (including a wheelchair)?: A Lot Help needed standing up from a chair using your arms (e.g., wheelchair or bedside chair)?: A Lot Help needed to walk in hospital room?: Total Help needed climbing 3-5 steps with a railing? : Total 6 Click Score: 10    End of Session Equipment Utilized During Treatment: Gait belt Activity Tolerance: Patient tolerated treatment well Patient left: in chair;with call bell/phone within reach;with chair alarm set;with family/visitor present   PT Visit Diagnosis: Other abnormalities of gait and mobility (R26.89);Other symptoms and signs involving the  nervous system (R29.898);Hemiplegia and hemiparesis Hemiplegia - Right/Left: Right Hemiplegia - dominant/non-dominant: Dominant Hemiplegia - caused by: Cerebral infarction     Time: 5170-0174 PT Time Calculation (min) (ACUTE ONLY): 20 min  Charges:  $Therapeutic Activity: 8-22 mins                     Magda Kiel, PT Acute Rehabilitation Services BSWHQ:[phone] Office:[phone] 06/10/2021    Mckenzie Guerrero 06/10/2021, 11:33 AM

## 2021-06-10 NOTE — NC FL2 (Signed)
[place] LEVEL OF CARE SCREENING TOOL     IDENTIFICATION  Patient Name: Mckenzie Guerrero Birthdate: [birth year] Sex: female Admission Date (Current Location): 06/08/2021  [facility] and [place] Number:  [employer] and Address:  [facility], [place] [address], [place], [place] [address]      Provider Number: [id number]  Attending Physician Name and Address:  Caren Griffins, MD  Relative Name and Phone Number:  Dreyah Montrose (son) [phone]    Current Level of Care: Hospital Recommended Level of Care: [place] Prior Approval Number:    Date Approved/Denied:   PASRR Number: [id number]  Discharge Plan: SNF    Current Diagnoses: Patient Active Problem List   Diagnosis Date Noted   Hyperlipidemia, unspecified 06/10/2021   History of migraine 06/10/2021   Thrombocytosis 06/10/2021   History of repair of hiatal hernia 06/10/2021   Acute CVA (cerebrovascular accident) ([place]) 06/08/2021   HTN (hypertension) 06/08/2021   Leukocytosis 06/08/2021   Status post laparoscopic Nissen fundoplication 97/35/3299   Cervical spondylosis with myelopathy and radiculopathy 09/28/2019    Orientation RESPIRATION BLADDER Height & Weight      (Disoriented, unable to assess due to aphasia)  Normal Incontinent, External catheter Weight: 135 lb 5.8 oz (61.4 kg) Height:  5\' 5"  (165.1 cm)  BEHAVIORAL SYMPTOMS/MOOD NEUROLOGICAL BOWEL NUTRITION STATUS      Continent Diet (see dc summary)  AMBULATORY STATUS COMMUNICATION OF NEEDS Skin   Extensive Assist Non-Verbally Other (Comment) (6 ports abdomen 1: Umbilicus;Upper 2: Medial;Superior 3:Right;Lateral;Mid 4: Ritght;Lateral 5: Left;Lateral 6: Left;Lateral)                       Personal Care Assistance Level of Assistance  Bathing, Feeding, Dressing Bathing Assistance: Maximum assistance Feeding assistance: Maximum assistance Dressing Assistance: Maximum assistance      Functional Limitations Info  Sight, Hearing, Speech   Hearing Info: Adequate Speech Info: Impaired    SPECIAL CARE FACTORS FREQUENCY  PT (By licensed PT), OT (By licensed OT)     PT Frequency: 5x per week OT Frequency: 5x per week            Contractures Contractures Info: Not present    Additional Factors Info  Code Status, Allergies Code Status Info: Full Code Allergies Info: Aspirin, Codeine, Doxycycline, Nsaids, Sulfonamide Derivatives, Prednisone           Current Medications (06/10/2021):  This is the current hospital active medication list Current Facility-Administered Medications  Medication Dose Route Frequency Provider Last Rate Last Admin   acetaminophen (TYLENOL) tablet 650 mg  650 mg Oral Q4H PRN Shela Leff, MD       Or   acetaminophen (TYLENOL) 160 MG/5ML solution 650 mg  650 mg Per Tube Q4H PRN Shela Leff, MD       Or   acetaminophen (TYLENOL) suppository 650 mg  650 mg Rectal Q4H PRN Shela Leff, MD       aspirin chewable tablet 81 mg  81 mg Oral Daily Gherghe, Costin M, MD   81 mg at 06/10/21 1435   atorvastatin (LIPITOR) tablet 80 mg  80 mg Oral Daily Shela Leff, MD   80 mg at 06/10/21 1435   dextrose 5 % in lactated ringers infusion   Intravenous Continuous Irene Pap N, DO 50 mL/hr at 06/09/21 1153 New Bag at 06/09/21 1153   labetalol (NORMODYNE) injection 5 mg  5 mg Intravenous Q2H PRN Kayleen Memos, DO  5 mg at 06/09/21 1148     Discharge Medications: Please see discharge summary for a list of discharge medications.  Relevant Imaging Results:  Relevant Lab Results:   Additional Information SSN: [id number]. [place] vaccines 08/24/19, 09/21/19  Benard Halsted, LCSW

## 2021-06-10 NOTE — Progress Notes (Addendum)
PROGRESS NOTE  Mckenzie Guerrero QAS:[id number] DOB: 12/14/1950 DOA: 06/08/2021 PCP: Raina Mina., MD   LOS: 2 days   Brief Narrative / Interim history: 70 year old female with history of migraine headaches, bleeding ulcers, HTN, hiatal hernia status post recent laparoscopic Nissen fundoplication on 79/89/2119 comes into the hospital on 11/28 as a code stroke after the family found her in the recliner at home.  Son says that she was alert, her eyes were open, had facial droop and could not move the right side of the body.  Last known well is at 9 AM that morning, and son found her around 5 PM in the afternoon.  She was brought to the ED as a code stroke and work-up revealed a large left MCA territory ischemic infarction without any LVO.  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  She was out of the window for tPA without presentation and not a thrombectomy candidate due to large complex stroke with aspects of 3.  Subjective / 24h Interval events: This morning when I entered the room, upon interacting with her she tries to open her eyes and mumbles but cannot articulate any words or answer any questions.  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  An MRI of the brain obtained later on showed evolving left MCA territory infarct with evidence of hemorrhagic transformation trace rightward midline shift.  Neurology consulted.  She  failed swallow eval and currently she is on rectal aspirin.  LDL 139, continue high intensity statin once she passes a swallow eval.  A1c 5.8 suggesting prediabetes.  2D echo showed EF 60-65%, no WMA, grade 1 diastolic dysfunction.  RV is normal.  No intracardiac source of embolus detected.  Lower extremity Doppler ultrasound was negative for DVT. -Repeat CT scan this morning identifies again large left MCA territory with multifocal confluent petechial hemorrhages without significant changes, but it did show some increased intracranial mass-effect with a 3 mm rightward midline shift -Appreciate neurology follow-up, therapy preliminary recommending CIR  Active Problems Hyperlipidemia-continue high intensity statin once passes swallow eval Prediabetes-A1c 5.8 Leukocytosis-possibly reactive, she did have leukocytosis on 11/15 around her surgery, which improved.  Continue to monitor, if persists or develops a fever or culture, potentially abdominal imaging Hiatal hernia-status post robotic repair with fundoplication on 41/74 0814. Thrombocytosis-possibly reactive Essential hypertension-blood pressure stable, monitor off blood pressure medications, strict n.p.o. currently History of migraines-monitor  Scheduled Meds:   stroke: mapping our early stages of recovery book   Does not apply Once   aspirin  300 mg Rectal Daily   atorvastatin  80 mg Oral Daily   Continuous Infusions:  dextrose 5% lactated ringers 50 mL/hr at 06/09/21 1153   PRN Meds:.acetaminophen **OR** acetaminophen (TYLENOL) oral liquid 160 mg/5 mL **OR** acetaminophen, labetalol  Diet Orders (From admission, onward)     Start     Ordered   06/08/21 2222  Diet NPO time specified  Diet effective now  06/08/21 2226            DVT prophylaxis: SCD's Start: 06/08/21 2222     Code Status: Full Code  Family Communication: 2 sounds present at the bedside  Status is: Inpatient  Remains inpatient appropriate because:  Persistent symptoms  Level of care: Progressive  Consultants:  Neurology   Procedures:  2d echo  Microbiology  none  Antimicrobials: none    Objective: Vitals:   06/09/21 1936 06/09/21 2358 06/10/21 0350 06/10/21 0834  BP: 138/71 (!) 142/76 (!) 150/77 129/71  Pulse: 68 77 (!) 56 65  Resp: 19 16 14 15   Temp: 99.5 F (37.5 C) 98.8 F (37.1 C) 98.3 F (36.8 C) 98.2 F (36.8 C)  TempSrc: Axillary Axillary Axillary Axillary  SpO2: 97% 96% 97% 97%  Weight:      Height:        Intake/Output Summary (Last 24 hours) at 06/10/2021 1101 Last data filed at 06/10/2021 0600 Gross per 24 hour  Intake 833.92 ml  Output 950 ml  Net -116.08 ml   Filed Weights   06/08/21 1842 06/08/21 1922  Weight: 61.4 kg 61.4 kg    Examination:  Constitutional: Poorly responsive Eyes: no scleral icterus ENMT: Mucous membranes are moist.  Neck: normal, supple Respiratory: Clear, no wheezing or crackles heard, normal respiratory effort Cardiovascular: Regular rate and rhythm, no murmurs / rubs / gallops. No LE edema.  Abdomen: non distended, no tenderness. Bowel sounds positive.  Musculoskeletal: no clubbing / cyanosis.  Skin: no rashes Neurologic: Unable to fully assess due to lethargy, does squeeze my hand on the left side  Data Reviewed: I have independently reviewed following labs and imaging studies   CBC: Recent Labs  Lab 06/08/21 1839 06/08/21 1844 06/09/21 0433  WBC 15.4*  --  21.9*  NEUTROABS 12.5*  --   --   HGB 13.0 13.3 14.6  HCT 39.9 39.0 44.7  MCV 93.0  --  91.6  PLT 442*  --  876*   Basic Metabolic Panel: Recent Labs  Lab 06/08/21 1839 06/08/21 1844  NA 136 138  K 3.5 3.6  CL 105 107  CO2 23  --   GLUCOSE 125* 121*  BUN 10 10  CREATININE 0.61 0.50  CALCIUM 8.7*  --    Liver Function Tests: Recent Labs  Lab 06/08/21 1839  AST 21  ALT 20  ALKPHOS 77  BILITOT 0.5  PROT 6.3*  ALBUMIN 3.5   Coagulation Profile: Recent Labs  Lab 06/08/21 1839   INR 1.1   HbA1C: Recent Labs    06/09/21 0433  HGBA1C 5.8*   CBG: Recent Labs  Lab 06/08/21 1838 06/09/21 1209 06/10/21 0145  GLUCAP 122* 132* 123*    Recent Results (from the past 240 hour(s))  Resp Panel by RT-PCR (Flu A&B, Covid) Urine, Clean Catch     Status: None   Collection Time: 06/08/21 10:45 PM   Specimen: Urine, Clean Catch; Nasopharyngeal(NP) swabs in vial transport medium  Result Value Ref Range Status   SARS Coronavirus 2 by RT PCR NEGATIVE NEGATIVE Final    Comment: (NOTE) SARS-CoV-2 target nucleic acids are NOT DETECTED.  The SARS-CoV-2 RNA is generally detectable in upper respiratory specimens during the acute phase of infection. The lowest concentration of SARS-CoV-2 viral copies this assay can detect is 138 copies/mL. A negative result does not preclude SARS-Cov-2 infection and should not be used as the sole basis for treatment or other patient management decisions. A negative result may occur  with  improper specimen collection/handling, submission of specimen other than nasopharyngeal swab, presence of viral mutation(s) within the areas targeted by this assay, and inadequate number of viral copies(<138 copies/mL). A negative result must be combined with clinical observations, patient history, and epidemiological information. The expected result is Negative.  Fact Sheet for Patients:  EntrepreneurPulse.com.au  Fact Sheet for Healthcare Providers:  IncredibleEmployment.be  This test is no t yet approved or cleared by the [place] FDA and  has been authorized for detection and/or diagnosis of SARS-CoV-2 by FDA under an Emergency Use Authorization (EUA). This EUA will remain  in effect (meaning this test can be used) for the duration of the COVID-19 declaration under Section 564(b)(1) of the Act, 21 U.S.C.section 360bbb-3(b)(1), unless the authorization is terminated  or revoked sooner.       Influenza A  by PCR NEGATIVE NEGATIVE Final   Influenza B by PCR NEGATIVE NEGATIVE Final    Comment: (NOTE) The Xpert Xpress SARS-CoV-2/FLU/RSV plus assay is intended as an aid in the diagnosis of influenza from Nasopharyngeal swab specimens and should not be used as a sole basis for treatment. Nasal washings and aspirates are unacceptable for Xpert Xpress SARS-CoV-2/FLU/RSV testing.  Fact Sheet for Patients: EntrepreneurPulse.com.au  Fact Sheet for Healthcare Providers: IncredibleEmployment.be  This test is not yet approved or cleared by the [place] FDA and has been authorized for detection and/or diagnosis of SARS-CoV-2 by FDA under an Emergency Use Authorization (EUA). This EUA will remain in effect (meaning this test can be used) for the duration of the COVID-19 declaration under Section 564(b)(1) of the Act, 21 U.S.C. section 360bbb-3(b)(1), unless the authorization is terminated or revoked.  Performed at [facility], [place] [address] [place], [place] [address]      Radiology Studies: CT HEAD WO CONTRAST (5MM)  Result Date: 06/10/2021 CLINICAL DATA:  70 year old female status post left MCA infarct, hemorrhagic transformation. EXAM: CT HEAD WITHOUT CONTRAST TECHNIQUE: Contiguous axial images were obtained from the base of the skull through the vertex without intravenous contrast. COMPARISON:  Brain MRI 06/09/2021 and earlier. FINDINGS: Brain: Widespread left MCA territory cytotoxic edema has increased in extent since the CT on 06/08/2021, stable compared to the DWI abnormality yesterday. Patchy areas of hyperdense hemorrhagic transformation (series 3, image 20) appears to be primarily confluent petechial hemorrhage. Mass effect on the left lateral ventricle has increased since 06/08/2021, but there is only 3 mm of rightward midline shift. No intraventricular or extra-axial extension of blood. No ventriculomegaly. Basilar cisterns remain normal.  Outside of the left MCA territory gray-white matter differentiation remains stable. Vascular: Mild Calcified atherosclerosis at the skull base. No suspicious intracranial vascular hyperdensity. Skull: Negative. Sinuses/Orbits: Visualized paranasal sinuses and mastoids are clear. Other: Visualized orbits and scalp soft tissues are within normal limits. IMPRESSION: 1. Large Left MCA territory infarct with multifocal confluent petechial hemorrhage not significantly changed from the MRI yesterday. Increased intracranial mass effect from 2 days ago, but only 3 mm of rightward midline shift now. Continued CT surveillance may be valuable. 2. No new intracranial abnormality. Electronically Signed   By: Genevie Ann M.D.   On: 06/10/2021 05:48   MR BRAIN WO CONTRAST  Result Date: 06/09/2021 CLINICAL DATA:  Stroke, follow-up, aphasia, right hemiplegia, right facial droop EXAM: MRI HEAD WITHOUT CONTRAST TECHNIQUE: Multiplanar, multiecho pulse sequences of the brain and surrounding structures were obtained without intravenous contrast. COMPARISON:  CT/CTA head and neck dated 1 day prior FINDINGS: Brain: There is extensive diffusion restriction  throughout the left MCA territory consistent with evolving early subacute MCA territory infarct there is significant T2* signal dropout consistent with associated hemorrhage. Edema from the infarct results in diffuse sulcal effacement, partial effacement of the left lateral ventricle, and minimal rightward midline shift. There is no other infarct. The ventricles are otherwise normal in size and configuration. There is no solid mass lesion. Vascular: The right V4 segment flow void is diminutive common better evaluated on the CTA from 1 day prior. The other major flow voids are present. Skull and upper cervical spine: Normal marrow signal. Sinuses/Orbits: Paranasal sinuses are clear. The globes and orbits are unremarkable. Other: None. IMPRESSION: Evolving left MCA territorial infarct with  evidence of hemorrhagic transformation and trace rightward midline shift. These results were called by telephone at the time of interpretation on 06/09/2021 at 11:21 am to provider Dr. Nevada Crane, who verbally acknowledged these results. Electronically Signed   By: Valetta Mole M.D.   On: 06/09/2021 11:22   ECHOCARDIOGRAM COMPLETE  Result Date: 06/09/2021    ECHOCARDIOGRAM REPORT   Patient Name:   KRYSTIANA FORNES Date of Exam: 06/09/2021 Medical Rec #:  [id number]       Height:       65.0 in Accession #:    [id number]      Weight:       135.4 lb Date of Birth:  [birth year]       BSA:          1.676 m Patient Age:    36 years        BP:           148/96 mmHg Patient Gender: F               HR:           78 bpm. Exam Location:  Inpatient Procedure: 2D Echo, Cardiac Doppler and Color Doppler Indications:    Stroke  History:        Patient has no prior history of Echocardiogram examinations.                 Risk Factors:Hypertension.  Sonographer:    Glo Herring Referring Phys: [id number] [place]  1. Left ventricular ejection fraction, by estimation, is 60 to 65%. The left ventricle has normal function. The left ventricle has no regional wall motion abnormalities. Left ventricular diastolic parameters are consistent with Grade I diastolic dysfunction (impaired relaxation).  2. Right ventricular systolic function is normal. The right ventricular size is normal. There is normal pulmonary artery systolic pressure. The estimated right ventricular systolic pressure is 76.5 mmHg.  3. Left atrial size was mildly dilated.  4. Right atrial size was mildly dilated.  5. The mitral valve is grossly normal. Trivial mitral valve regurgitation. No evidence of mitral stenosis.  6. The aortic valve is tricuspid. Aortic valve regurgitation is trivial. No aortic stenosis is present.  7. There is moderate dilatation of the ascending aorta, measuring 43 mm.  8. The inferior vena cava is normal in size with greater than  50% respiratory variability, suggesting right atrial pressure of 3 mmHg. Conclusion(s)/Recommendation(s): No intracardiac source of embolism detected on this transthoracic study. Consider a transesophageal echocardiogram to exclude cardiac source of embolism if clinically indicated. FINDINGS  Left Ventricle: Left ventricular ejection fraction, by estimation, is 60 to 65%. The left ventricle has normal function. The left ventricle has no regional wall motion abnormalities. The left ventricular internal cavity size was normal in size. There is  no left ventricular hypertrophy. Left ventricular diastolic parameters are consistent with Grade I diastolic dysfunction (impaired relaxation). Right Ventricle: The right ventricular size is normal. No increase in right ventricular wall thickness. Right ventricular systolic function is normal. There is normal pulmonary artery systolic pressure. The tricuspid regurgitant velocity is 2.14 m/s, and  with an assumed right atrial pressure of 3 mmHg, the estimated right ventricular systolic pressure is 20.9 mmHg. Left Atrium: Left atrial size was mildly dilated. Right Atrium: Right atrial size was mildly dilated. Pericardium: Trivial pericardial effusion is present. Presence of epicardial fat layer. Mitral Valve: The mitral valve is grossly normal. Trivial mitral valve regurgitation. No evidence of mitral valve stenosis. Tricuspid Valve: The tricuspid valve is grossly normal. Tricuspid valve regurgitation is trivial. No evidence of tricuspid stenosis. Aortic Valve: The aortic valve is tricuspid. Aortic valve regurgitation is trivial. No aortic stenosis is present. Aortic valve mean gradient measures 3.0 mmHg. Aortic valve peak gradient measures 4.7 mmHg. Pulmonic Valve: The pulmonic valve was grossly normal. Pulmonic valve regurgitation is trivial. No evidence of pulmonic stenosis. Aorta: The aortic root is normal in size and structure. There is moderate dilatation of the ascending  aorta, measuring 43 mm. Venous: The inferior vena cava is normal in size with greater than 50% respiratory variability, suggesting right atrial pressure of 3 mmHg. IAS/Shunts: The atrial septum is grossly normal.  LEFT VENTRICLE PLAX 2D LVIDd:         3.00 cm     Diastology LVIDs:         1.90 cm     LV e' medial:    6.85 cm/s LV PW:         1.00 cm     LV E/e' medial:  10.5 LV IVS:        1.00 cm     LV e' lateral:   8.92 cm/s                            LV E/e' lateral: 8.0  LV Volumes (MOD) LV vol d, MOD A2C: 62.5 ml LV vol d, MOD A4C: 67.9 ml LV vol s, MOD A2C: 26.0 ml LV vol s, MOD A4C: 26.7 ml LV SV MOD A2C:     36.5 ml LV SV MOD A4C:     67.9 ml LV SV MOD BP:      38.6 ml RIGHT VENTRICLE             IVC RV Basal diam:  3.40 cm     IVC diam: 1.95 cm RV S prime:     12.10 cm/s LEFT ATRIUM           Index        RIGHT ATRIUM           Index LA diam:      2.40 cm 1.43 cm/m   RA Area:     20.50 cm LA Vol (A2C): 60.9 ml 36.34 ml/m  RA Volume:   52.90 ml  31.57 ml/m  AORTIC VALVE                   PULMONIC VALVE AV Vmax:           108.00 cm/s PV Vmax:       1.18 m/s AV Vmean:          82.000 cm/s PV Peak grad:  5.6 mmHg AV VTI:  0.221 m AV Peak Grad:      4.7 mmHg AV Mean Grad:      3.0 mmHg LVOT Vmax:         99.80 cm/s LVOT Vmean:        70.400 cm/s LVOT VTI:          0.202 m LVOT/AV VTI ratio: 0.91  AORTA Ao Root diam: 3.90 cm Ao Asc diam:  4.30 cm MITRAL VALVE               TRICUSPID VALVE MV Area (PHT): 3.45 cm    TR Peak grad:   18.3 mmHg MV Decel Time: 220 msec    TR Vmax:        214.00 cm/s MV E velocity: 71.60 cm/s MV A velocity: 71.60 cm/s  SHUNTS MV E/A ratio:  1.00        Systemic VTI: 0.20 m Eleonore Chiquito MD Electronically signed by Eleonore Chiquito MD Signature Date/Time: 06/09/2021/1:20:55 PM    Final    VAS [place] LOWER EXTREMITY VENOUS (DVT)  Result Date: 06/09/2021  Lower Venous DVT Study Patient Name:  DAZHA KEMPA  Date of Exam:   06/09/2021 Medical Rec #: [id number]         Accession #:    [id number] Date of Birth: [birth year]        Patient Gender: F Patient Age:   19 years Exam Location:  [facility] Procedure:      VAS [place] LOWER EXTREMITY VENOUS (DVT) Referring Phys: Cornelius Moras XU --------------------------------------------------------------------------------  Indications: Stroke.  Limitations: Poor patient cooperation. Comparison Study: No prior study Performing Technologist: Maudry Mayhew MHA, RDMS, RVT, RDCS  Examination Guidelines: A complete evaluation includes B-mode imaging, spectral Doppler, color Doppler, and power Doppler as needed of all accessible portions of each vessel. Bilateral testing is considered an integral part of a complete examination. Limited examinations for reoccurring indications may be performed as noted. The reflux portion of the exam is performed with the patient in reverse Trendelenburg.  +---------+---------------+---------+-----------+----------+--------------+ RIGHT    CompressibilityPhasicitySpontaneityPropertiesThrombus Aging +---------+---------------+---------+-----------+----------+--------------+ CFV      Full           Yes      Yes                                 +---------+---------------+---------+-----------+----------+--------------+ SFJ      Full                                                        +---------+---------------+---------+-----------+----------+--------------+ FV Prox  Full                                                        +---------+---------------+---------+-----------+----------+--------------+ FV Mid   Full                                                        +---------+---------------+---------+-----------+----------+--------------+ FV DistalFull                                                        +---------+---------------+---------+-----------+----------+--------------+  PFV      Full                                                         +---------+---------------+---------+-----------+----------+--------------+ POP      Full           Yes      Yes                                 +---------+---------------+---------+-----------+----------+--------------+ PTV      Full                                                        +---------+---------------+---------+-----------+----------+--------------+ PERO     Full                                                        +---------+---------------+---------+-----------+----------+--------------+   +---------+---------------+---------+-----------+----------+--------------+ LEFT     CompressibilityPhasicitySpontaneityPropertiesThrombus Aging +---------+---------------+---------+-----------+----------+--------------+ CFV      Full           Yes      Yes                                 +---------+---------------+---------+-----------+----------+--------------+ SFJ      Full                                                        +---------+---------------+---------+-----------+----------+--------------+ FV Prox  Full                                                        +---------+---------------+---------+-----------+----------+--------------+ FV Mid   Full                                                        +---------+---------------+---------+-----------+----------+--------------+ FV DistalFull                                                        +---------+---------------+---------+-----------+----------+--------------+ PFV      Full                                                        +---------+---------------+---------+-----------+----------+--------------+  POP      Full           Yes      Yes                                 +---------+---------------+---------+-----------+----------+--------------+ PTV      Full                                                         +---------+---------------+---------+-----------+----------+--------------+ PERO     Full                                                        +---------+---------------+---------+-----------+----------+--------------+  Limited evaluation of bilateral calf veins due to poor cooperation.    Summary: RIGHT: - There is no evidence of deep vein thrombosis in the lower extremity. However, portions of this examination were limited- see technologist comments above.  - No cystic structure found in the popliteal fossa.  LEFT: - There is no evidence of deep vein thrombosis in the lower extremity. However, portions of this examination were limited- see technologist comments above.  - No cystic structure found in the popliteal fossa.  *See table(s) above for measurements and observations. Electronically signed by Harold Barban MD on 06/09/2021 at 7:03:56 PM.    Final     Marzetta Board, MD, PhD [facility]  Between 7 am - 7 pm I am available, please contact me via Amion (for emergencies) or Securechat (non urgent messages)  Between 7 pm - 7 am I am not available, please contact night coverage MD/APP via Amion

## 2021-06-10 NOTE — Evaluation (Signed)
Speech Language Pathology Evaluation Patient Details Name: Mckenzie Guerrero MRN: [id number] DOB: 1951-06-14 Today's Date: 06/10/2021 Time: 1000-1030 SLP Time Calculation (min) (ACUTE ONLY): 30 min  Problem List:  Patient Active Problem List   Diagnosis Date Noted   Acute CVA (cerebrovascular accident) ([place]) 06/08/2021   HTN (hypertension) 06/08/2021   Leukocytosis 06/08/2021   Status post laparoscopic Nissen fundoplication 46/80/3212   Cervical spondylosis with myelopathy and radiculopathy 09/28/2019   Past Medical History:  Past Medical History:  Diagnosis Date   Anxiety    Arthritis    osteoarthritis   Complication of anesthesia    GERD (gastroesophageal reflux disease)    Headache    migraines   History of bleeding ulcers 2010   History of hiatal hernia    Hypertension    Neuromuscular disorder (HCC)    tremors   Occasional tremors    PONV (postoperative nausea and vomiting)    Past Surgical History:  Past Surgical History:  Procedure Laterality Date   ANTERIOR CERVICAL DECOMP/DISCECTOMY FUSION N/A 09/28/2019   Procedure: Anterior Cervical Discectomy Fusion - Cervical Four-Cervical Five- Cervical Five-Cervical Six;  Surgeon: Earnie Larsson, MD;  Location: [facility];  Service: Neurosurgery;  Laterality: N/A;  Anterior Cervical Discectomy Fusion - Cervical Four-Cervical Five- Cervical Five-Cervical Six   benign tumor removal from left breast 2003  2003   BUNIONECTOMY Left    Great toe   COLONOSCOPY     ESOPHAGOGASTRODUODENOSCOPY N/A 05/25/2021   Procedure: ESOPHAGOGASTRODUODENOSCOPY (EGD);  Surgeon: Johnathan Hausen, MD;  Location: [facility];  Service: General;  Laterality: N/A;   XI ROBOTIC ASSISTED HIATAL HERNIA REPAIR N/A 05/25/2021   Procedure: XI ROBOTIC ASSISTED HIATAL HERNIA REPAIR WITH FUNDOPLICATION;  Surgeon: Johnathan Hausen, MD;  Location: [facility];  Service: General;  Laterality: N/A;   HPI:  [name] is a 70 y.o. female presenting after after family found her in  her recliner at home with aphasia, right hemiplegia, right facial droop, and left gaze preference. CT head revealed acute to early subacute left MCA territory infarct without evidence of hemorrhagic transformation. Pt with medical history significant of migraine headaches, bleeding ulcers, hypertension, tremors, hiatal hernia and GERD status post recent laparoscopic Nissen fundoplication on 24/82/5003.   Assessment / Plan / Recommendation Clinical Impression  Pt demonstrates global aphasia with additional cognitive and motor planning impairment. Pt is inattentive to right viusal field, only displayed focused attention to external stimuli for 1-2 instances during the session despite heaving cueing. Pt does not appear to recognize objects or initiate their use functionally. She does not attempt to verbally communicate; no groping or attempts to gesture. She does phonate spontaneously. Will continue efforts to elicit attention and functional communication.    SLP Assessment  SLP Recommendation/Assessment: Patient needs continued Speech [facility] SLP Visit Diagnosis: Aphasia (R47.01)    Recommendations for follow up therapy are one component of a multi-disciplinary discharge planning process, led by the attending physician.  Recommendations may be updated based on patient status, additional functional criteria and insurance authorization.    Follow Up Recommendations  Acute inpatient rehab (3hours/day)    Assistance Recommended at Discharge  Frequent or constant Supervision/Assistance  Functional Status Assessment Patient has had a recent decline in their functional status and demonstrates the ability to make significant improvements in function in a reasonable and predictable amount of time.  Frequency and Duration min 2x/week  2 weeks      SLP Evaluation Cognition  Overall Cognitive Status: Impaired/Different from baseline Arousal/Alertness: Lethargic  Orientation Level:  Other (comment) (UTA due to aphasia) Attention: Focused Focused Attention: Impaired Focused Attention Impairment: Verbal basic;Functional basic Awareness: Impaired Awareness Impairment: Intellectual impairment Problem Solving: Impaired Problem Solving Impairment: Verbal basic;Functional basic Safety/Judgment: Impaired       Comprehension  Auditory Comprehension Overall Auditory Comprehension: Impaired Yes/No Questions: Impaired Basic Biographical Questions: 0-25% accurate Commands: Impaired One Step Basic Commands: 0-24% accurate Reading Comprehension Reading Status: Not tested    Expression Verbal Expression Overall Verbal Expression: Impaired Initiation: Impaired Repetition: Impaired Level of Impairment: Word level Naming: Impairment Confrontation: Impaired Convergent: 0-24% accurate Interfering Components: Attention   Oral / Motor  Oral Motor/Sensory Function Overall Oral Motor/Sensory Function: Moderate impairment Facial ROM: Reduced right Facial Symmetry: Abnormal symmetry right Facial Strength: Reduced right Lingual ROM: Within Functional Limits Lingual Symmetry: Within Functional Limits Lingual Strength: Within Functional Limits Lingual Sensation: Within Functional Limits Motor Speech Overall Motor Speech: Impaired Phonation: Normal Articulation:  (NT)   GO                    Amonda Brillhart, Katherene Ponto 06/10/2021, 2:45 PM

## 2021-06-10 NOTE — Evaluation (Signed)
Occupational Therapy Evaluation Patient Details Name: Mckenzie Guerrero MRN: [id number] DOB: 1951/01/13 Today's Date: 06/10/2021   History of Present Illness Pt is a 70 y/o female admitted 11/28 secondary to R weakness and decreased responsiveness. CT showed L MCA infarct. PMH includes HTN, migraines, tremors, recent hernia repair and s/p ACDF.   Clinical Impression   Zigmund Daniel was mod I/indep PTA per her 3 sons. She lives alone ina  1 level home with level entry. Upon evaluation pt demonstrated impaired expressive and receptive communication, following some functional commands with multimodal cues. Overall she was mod A for bed mobility, mod A +2 for transfers and short ambulation. Attempted grooming at the sink however pt was unable to sequence task despite increased cues. She is limited by impaired communication and cognition, R weakness, poor sensory motor coordination and visual impairment. She will beenfit from OT acutely. Recommend d/c to CIR, family is agreeable to plan with hopes to figure out more home care at d/c.     Recommendations for follow up therapy are one component of a multi-disciplinary discharge planning process, led by the attending physician.  Recommendations may be updated based on patient status, additional functional criteria and insurance authorization.   Follow Up Recommendations  Acute inpatient rehab (3hours/day)    Assistance Recommended at Discharge Frequent or constant Supervision/Assistance  Functional Status Assessment  Patient has had a recent decline in their functional status and/or demonstrates limited ability to make significant improvements in function in a reasonable and predictable amount of time  Equipment Recommendations  Wheelchair (measurements OT);Wheelchair cushion (measurements OT);BSC/3in1    Recommendations for Other Services Rehab consult     Precautions / Restrictions Precautions Precautions: Fall Precaution Comments: Pt with recent R  hernia repairl R inattention, aphasic Restrictions Weight Bearing Restrictions: No      Mobility Bed Mobility Overal bed mobility: Needs Assistance Bed Mobility: Supine to Sit     Supine to sit: Mod assist;HOB elevated     General bed mobility comments: assist for initiation to bring legs off bed and for competing righting of her trunk    Transfers Overall transfer level: Needs assistance Equipment used: 2 person hand held assist Transfers: Sit to/from Stand;Bed to chair/wheelchair/BSC Sit to Stand: Mod assist;+2 physical assistance     Step pivot transfers: Mod assist;+2 safety/equipment     General transfer comment: Patient impulsive needing cues and hand for waiting to transfer to chair as she saw it on her L and immediately initiating movement; stood from chair to sink with +2 A for anterior weight shift to engage LE's; then stood again prior to ambulation in the room      Balance Overall balance assessment: Needs assistance Sitting-balance support: Feet supported Sitting balance-Leahy Scale: Poor Sitting balance - Comments: initially leaning R, then sitting with S after feet on floor Postural control: Right lateral lean Standing balance support: Bilateral upper extremity supported Standing balance-Leahy Scale: Poor Standing balance comment: assist for balance with bilateral UE support                           ADL either performed or assessed with clinical judgement   ADL Overall ADL's : Needs assistance/impaired Eating/Feeding: NPO   Grooming: Wash/dry face;Minimal assistance;Sitting   Upper Body Bathing: Sitting;Moderate assistance   Lower Body Bathing: Maximal assistance;+2 for safety/equipment;Sit to/from stand   Upper Body Dressing : Moderate assistance;Sitting   Lower Body Dressing: Maximal assistance;+2 for safety/equipment;Sit to/from stand   Toilet  Transfer: Moderate assistance;Ambulation;+2 for safety/equipment;+2 for physical  assistance   Toileting- Clothing Manipulation and Hygiene: Maximal assistance;+2 for safety/equipment;Sit to/from stand       Functional mobility during ADLs: Moderate assistance;+2 for safety/equipment       Vision Baseline Vision/History: 0 No visual deficits Ability to See in Adequate Light: 0 Adequate Vision Assessment?: Vision impaired- to be further tested in functional context Additional Comments: difficult to assess. gaze fixed down and to the L. pt held eyes closed for the majority of the session. Able to briefly attend to midline stimuli, but did not cross midline            Pertinent Vitals/Pain Pain Assessment: Faces Faces Pain Scale: No hurt Pain Intervention(s): Monitored during session        Extremity/Trunk Assessment Upper Extremity Assessment Upper Extremity Assessment: Difficult to assess due to impaired cognition (difficult to assess due to limited command following. BUE PROM is [facility]. Pt reaching for functional items (comb) with L hand, noted RUE movement with bed mobility and transferring)   Lower Extremity Assessment Lower Extremity Assessment: Defer to PT evaluation   Cervical / Trunk Assessment Cervical / Trunk Assessment: Kyphotic   Communication Communication Communication: Expressive difficulties;Receptive difficulties   Cognition Arousal/Alertness: Awake/alert;Lethargic Behavior During Therapy: Flat affect;Impulsive Overall Cognitive Status: Impaired/Different from baseline Area of Impairment: Attention;Following commands;Safety/judgement                   Current Attention Level: Focused   Following Commands: Follows one step commands inconsistently Safety/Judgement: Decreased awareness of safety;Decreased awareness of deficits     General Comments: limited assessment due to aphasia, patient able to follow commands with physical assist and max verbal/tactile and gestureing cues; three sons in the room and report patient completely  independent prior     General Comments  VSS on RA, 3 sons present and supportive. They continue to agree CIR is the best option and they will work out care after d/c            Home Living Family/patient expects to be discharged to:: Private residence Living Arrangements: Alone Available Help at Discharge: Family Type of Home: House Home Access: Stairs to enter Technical brewer of Steps: 1 Entrance Stairs-Rails: None Home Layout: Two level Alternate Level Stairs-Number of Steps: flight Alternate Level Stairs-Rails: Right Bathroom Shower/Tub: Occupational psychologist: Standard     Home Equipment: None          Prior Functioning/Environment Prior Level of Function : Independent/Modified Independent                        OT Problem List: Decreased strength;Decreased range of motion;Decreased activity tolerance;Impaired balance (sitting and/or standing);Decreased safety awareness;Decreased knowledge of use of DME or AE;Decreased knowledge of precautions;Pain;Impaired UE functional use      OT Treatment/Interventions: Self-care/ADL training;Therapeutic exercise;Patient/family education;DME and/or AE instruction    OT Goals(Current goals can be found in the care plan section) Acute Rehab OT Goals Patient Stated Goal: unable to state OT Goal Formulation: With family Time For Goal Achievement: 06/24/21 Potential to Achieve Goals: Fair ADL Goals Pt Will Perform Grooming: with set-up;sitting Pt Will Perform Upper Body Dressing: sitting;with supervision Pt Will Perform Lower Body Dressing: with min guard assist;sit to/from stand Pt Will Transfer to Toilet: with min guard assist;ambulating Additional ADL Goal #1: Pt will obtain grooming items located on her R side with min cues  OT Frequency: Min 2X/week  Co-evaluation PT/OT/SLP Co-Evaluation/Treatment: Yes Reason for Co-Treatment: Complexity of the patient's impairments (multi-system  involvement);Necessary to address cognition/behavior during functional activity;For patient/therapist safety;To address functional/ADL transfers PT goals addressed during session: Balance;Mobility/safety with mobility OT goals addressed during session: ADL's and self-care;Strengthening/ROM      AM-PAC OT "6 Clicks" Daily Activity     Outcome Measure Help from another person eating meals?: Total Help from another person taking care of personal grooming?: A Lot Help from another person toileting, which includes using toliet, bedpan, or urinal?: A Lot Help from another person bathing (including washing, rinsing, drying)?: A Lot Help from another person to put on and taking off regular upper body clothing?: A Lot Help from another person to put on and taking off regular lower body clothing?: A Lot 6 Click Score: 11   End of Session Equipment Utilized During Treatment: Gait belt Nurse Communication: Mobility status  Activity Tolerance: Patient tolerated treatment well Patient left: in chair;with call bell/phone within reach;with chair alarm set;with family/visitor present  OT Visit Diagnosis: Unsteadiness on feet (R26.81);Other abnormalities of gait and mobility (R26.89);Muscle weakness (generalized) (M62.81);History of falling (Z91.81);Hemiplegia and hemiparesis;Pain Hemiplegia - Right/Left: Right Hemiplegia - dominant/non-dominant: Dominant Hemiplegia - caused by: Cerebral infarction                Time: 3474-2595 OT Time Calculation (min): 19 min Charges:  OT General Charges $OT Visit: 1 Visit OT Evaluation $OT Eval Moderate Complexity: 1 Mod   Lew Prout A Jaime Grizzell 06/10/2021, 1:00 PM

## 2021-06-10 NOTE — Evaluation (Signed)
Clinical/Bedside Swallow Evaluation Patient Details  Name: Mckenzie Guerrero MRN: [id number] Date of Birth: 07-22-1950  Today's Date: 06/10/2021 Time: SLP Start Time (ACUTE ONLY): 1000 SLP Stop Time (ACUTE ONLY): 1030 SLP Time Calculation (min) (ACUTE ONLY): 30 min  Past Medical History:  Past Medical History:  Diagnosis Date   Anxiety    Arthritis    osteoarthritis   Complication of anesthesia    GERD (gastroesophageal reflux disease)    Headache    migraines   History of bleeding ulcers 2010   History of hiatal hernia    Hypertension    Neuromuscular disorder (HCC)    tremors   Occasional tremors    PONV (postoperative nausea and vomiting)    Past Surgical History:  Past Surgical History:  Procedure Laterality Date   ANTERIOR CERVICAL DECOMP/DISCECTOMY FUSION N/A 09/28/2019   Procedure: Anterior Cervical Discectomy Fusion - Cervical Four-Cervical Five- Cervical Five-Cervical Six;  Surgeon: Earnie Larsson, MD;  Location: [place];  Service: Neurosurgery;  Laterality: N/A;  Anterior Cervical Discectomy Fusion - Cervical Four-Cervical Five- Cervical Five-Cervical Six   benign tumor removal from left breast 2003  2003   BUNIONECTOMY Left    Great toe   COLONOSCOPY     ESOPHAGOGASTRODUODENOSCOPY N/A 05/25/2021   Procedure: ESOPHAGOGASTRODUODENOSCOPY (EGD);  Surgeon: Johnathan Hausen, MD;  Location: [facility];  Service: General;  Laterality: N/A;   XI ROBOTIC ASSISTED HIATAL HERNIA REPAIR N/A 05/25/2021   Procedure: XI ROBOTIC ASSISTED HIATAL HERNIA REPAIR WITH FUNDOPLICATION;  Surgeon: Johnathan Hausen, MD;  Location: [facility];  Service: General;  Laterality: N/A;   HPI:  Mckenzie Guerrero is a 70 y.o. female presenting after after family found her in her recliner at home with aphasia, right hemiplegia, right facial droop, and left gaze preference. CT head revealed acute to early subacute left MCA territory infarct without evidence of hemorrhagic transformation. Pt with medical history  significant of migraine headaches, bleeding ulcers, hypertension, tremors, hiatal hernia and GERD status post recent laparoscopic Nissen fundoplication on 59/93/5701.    Assessment / Plan / Recommendation  Clinical Impression  Pt presents with significantly impaired awareness and automaticity with PO trials. Pt minimally aware of cup edge, spoon or straw to lips. Tactile feedback did trigger pt to open mouth enough for a small sip or bite to be given. When PO insterted into pts mouth she did have relatively good appearing oral control and no signs of aspiration observed following trials. Pt will ultimately need instrumental assessment of swallowing to determine safety with a diet. Will plan to proceed with MBS tomorrow. However, she can be given ice and meds crushed in puree in the meantime. Discussed the probability of limited intake with PO regardless of diet with family, unless significant gains in motor planning and attention are achieved. SLP Visit Diagnosis: Dysphagia, oropharyngeal phase (R13.12)    Aspiration Risk  Moderate aspiration risk    Diet Recommendation Dysphagia 1 (Puree);Thin liquid   Liquid Administration via: Cup;Straw Medication Administration: Whole meds with liquid Supervision: Full supervision/cueing for compensatory strategies Compensations: Slow rate;Small sips/bites;Minimize environmental distractions Postural Changes: Seated upright at 90 degrees    Other  Recommendations Oral Care Recommendations: Oral care BID Other Recommendations: Have oral suction available    Recommendations for follow up therapy are one component of a multi-disciplinary discharge planning process, led by the attending physician.  Recommendations may be updated based on patient status, additional functional criteria and insurance authorization.  Follow up Recommendations Acute inpatient rehab (3hours/day)  Assistance Recommended at Discharge    Functional Status Assessment Patient  has had a recent decline in their functional status and demonstrates the ability to make significant improvements in function in a reasonable and predictable amount of time.  Frequency and Duration min 2x/week  2 weeks       Prognosis        Swallow Study   General HPI: Mckenzie Guerrero is a 70 y.o. female presenting after after family found her in her recliner at home with aphasia, right hemiplegia, right facial droop, and left gaze preference. CT head revealed acute to early subacute left MCA territory infarct without evidence of hemorrhagic transformation. Pt with medical history significant of migraine headaches, bleeding ulcers, hypertension, tremors, hiatal hernia and GERD status post recent laparoscopic Nissen fundoplication on 30/94/0768. Type of Study: Bedside Swallow Evaluation Diet Prior to this Study: NPO;Other (Comment) (had been on full liquids after Nissen) Temperature Spikes Noted: No Respiratory Status: Room air History of Recent Intubation: No Behavior/Cognition: Alert;Requires cueing;Confused;Doesn't follow directions Oral Cavity Assessment: Within Functional Limits Oral Care Completed by SLP: No Oral Cavity - Dentition: Adequate natural dentition Self-Feeding Abilities: Total assist Patient Positioning: Upright in bed Baseline Vocal Quality: Normal Volitional Cough: Cognitively unable to elicit Volitional Swallow: Unable to elicit    Oral/Motor/Sensory Function Overall Oral Motor/Sensory Function: Within functional limits   Ice Chips Ice chips: Impaired Presentation: Spoon Oral Phase Impairments: Poor awareness of bolus   Thin Liquid Thin Liquid: Impaired Presentation: Straw;Cup Oral Phase Impairments: Poor awareness of bolus    Nectar Thick Nectar Thick Liquid: Not tested   Honey Thick Honey Thick Liquid: Not tested   Puree Puree: Impaired Presentation: Spoon Oral Phase Impairments: Poor awareness of bolus   Solid     Solid: Not tested      Lebaron Bautch,  Katherene Ponto 06/10/2021,2:36 PM

## 2021-06-10 NOTE — Progress Notes (Signed)
STROKE TEAM PROGRESS NOTE   INTERVAL HISTORY Multiple family members at bedside including sons.  Patient still drowsy sleepy, hard to arouse during rounding.  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  Reviewed neuro images with family and answered all questions.  They expressed understanding and appreciation.   Vitals:   06/09/21 2358 06/10/21 0350 06/10/21 0834 06/10/21 1225  BP: (!) 142/76 (!) 150/77 129/71   Pulse: 77 (!) 56 65   Resp: 16 14 15    Temp: 98.8 F (37.1 C) 98.3 F (36.8 C) 98.2 F (36.8 C) (!) 97.5 F (36.4 C)  TempSrc: Axillary Axillary Axillary Oral  SpO2: 96% 97% 97%   Weight:      Height:       CBC:  Recent Labs  Lab 06/08/21 1839 06/08/21 1844 06/09/21 0433  WBC 15.4*  --  21.9*  NEUTROABS 12.5*  --   --   HGB 13.0 13.3 14.6  HCT 39.9 39.0 44.7  MCV 93.0  --  91.6  PLT 442*  --  878*   Basic Metabolic Panel:  Recent Labs  Lab 06/08/21 1839 06/08/21 1844  NA 136 138  K 3.5 3.6  CL 105 107  CO2 23  --   GLUCOSE 125* 121*  BUN 10 10  CREATININE 0.61 0.50  CALCIUM 8.7*  --      Lipid Panel:  Recent Labs  Lab 06/09/21 0433  CHOL 200  TRIG 75  HDL 46  CHOLHDL 4.3  VLDL 15  LDLCALC 139*    HgbA1c:  Recent Labs  Lab 06/09/21 0433  HGBA1C 5.8*   Urine Drug Screen: No results for input(s): LABOPIA, COCAINSCRNUR, LABBENZ, AMPHETMU, THCU, LABBARB in the last 168 hours.  Alcohol Level No results for input(s): ETH in the last 168 hours.  IMAGING past 24 hours CT HEAD WO CONTRAST (5MM)  Result Date: 06/10/2021 CLINICAL DATA:  70 year old female status post left MCA infarct, hemorrhagic transformation. EXAM: CT HEAD WITHOUT CONTRAST TECHNIQUE: Contiguous axial images were obtained from the base of the skull through the vertex without intravenous contrast. COMPARISON:  Brain MRI 06/09/2021 and earlier. FINDINGS: Brain: Widespread left MCA territory cytotoxic edema has increased in extent since the CT on 06/08/2021, stable compared to  the DWI abnormality yesterday. Patchy areas of hyperdense hemorrhagic transformation (series 3, image 20) appears to be primarily confluent petechial hemorrhage. Mass effect on the left lateral ventricle has increased since 06/08/2021, but there is only 3 mm of rightward midline shift. No intraventricular or extra-axial extension of blood. No ventriculomegaly. Basilar cisterns remain normal. Outside of the left MCA territory gray-white matter differentiation remains stable. Vascular: Mild Calcified atherosclerosis at the skull base. No suspicious intracranial vascular hyperdensity. Skull: Negative. Sinuses/Orbits: Visualized paranasal sinuses and mastoids are clear. Other: Visualized orbits and scalp soft tissues are within normal limits. IMPRESSION: 1. Large Left MCA territory infarct with multifocal confluent petechial hemorrhage not significantly changed from the MRI yesterday. Increased intracranial mass effect from 2 days ago, but only 3 mm of rightward midline shift now. Continued CT surveillance may be valuable. 2. No new intracranial abnormality. Electronically Signed   By: Genevie Ann M.D.   On: 06/10/2021 05:48    PHYSICAL EXAM  Temp:  [97.5 F (36.4 C)-98.8 F (37.1 C)] 97.5 F (36.4 C) (11/30 1225) Pulse Rate:  [56-77] 65 (11/30 0834) Resp:  [14-16] 15 (11/30 0834) BP: (129-150)/(71-77) 129/71 (11/30 0834) SpO2:  [96 %-97 %] 97 % (11/30 0834)  General - Well nourished, well  developed, hard to arouse.  Ophthalmologic - fundi not visualized due to noncooperation.  Cardiovascular - Regular rhythm and rate.  Neuro - patient somnolent, not open eyes on voice or pain summation.  With forced eye opening, left gaze preference, not able to have right gaze.  Not blinking to visual threat.  Right facial droop, right upper and lower extremity hemiplegia.  Left upper extremity able to against gravity briefly, left lower extremity strong withdraw to pain. Sensation, coordination and gait not tested.     ASSESSMENT/PLAN [name] with history of migraine headaches, anxiety, gleeding ulcers, HTN and tremor, presenting to the ED via EMS as a Code Stroke after family found her in her recliner at home with aphasia, right hemiplegia, right facial droop and left gaze preference. LKN per family was 0900 when they spoke with her on the phone. Later in the day family was unable to contact her by phone and she was also not answering her door, so a family member went to the house and opened the door with a spare key to check on her. When he entered, he found her as above and EMS was called. EMS noted the same on scene. Code Stroke was called in the field. On arrival to the ED her condition was unchanged. No obvious signs of trauma. STAT CT head was obtained.    Stroke:  large left MCA infarcts with hemorrhagic conversion secondary unclear source, concerning for occult A. fib CT head : acute to early subacute left MCA territorial infarct without hemorrhagic transformation. Aspects 4.  CTA head & neck  - No intracranial large vessel occlusion. 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 CT perfusion  - 42 mL left MCA territory core infarct with 14 mL surrounding ischemic penumbra. MRI Evolving left MCA territorial infarct with evidence of hemorrhagic transformation and trace rightward midline shift. CT repeat large MCA infarct with petechial hemorrhage, no signal change slightly increased intracranial mass-effect, 3 mm midline shift Consider to repeat CT in 2 days to evaluate cerebral edema 2D Echo EF 60 to 65% LE venous Doppler no DVT Recommend 30-day cardiac event monitoring as outpatient to rule out A. fib LDL 139 HgbA1c 5.8 VTE prophylaxis -Heparin subcu No antithrombotic prior to admission, now on aspirin 300 mg suppository daily with NPO status.   Therapy recommendations:  CIR Disposition:  pending   Hypertension Home meds:   ramipril stable Long-term BP goal normotensive  Hyperlipidemia Home meds:  none LDL 139, goal < 70 Add lipitor once p.o. access Continue statin at discharge  Dysphagia Initially put on dysphagia 1 and thin liquid But now still n.p.o. due to mental status Speech on board On IV fluid CBC BMP pending in am  Other Stroke Risk Factors Advanced Age >/= 29  Migraines     Other Active Problems Leukocytosis WBC 15.4 -> pending hiatal hernia   Hospital day # 2  I had long discussion with sons at bedside, reviewed neuro images, updated pt current condition, treatment plan and potential prognosis, and answered all the questions.  They expressed understanding and appreciation.   I spent  35 minutes in total face-to-face time with the patient, more than 50% of which was spent in counseling and coordination of care, reviewing test results, images and medication, and discussing the diagnosis, treatment plan and potential prognosis. This patient's care requiresreview of multiple databases, neurological assessment, discussion with family, other specialists and medical decision  making of high complexity.  Rosalin Hawking, MD PhD Stroke Neurology 06/10/2021 8:05 PM

## 2021-06-11 ENCOUNTER — Inpatient Hospital Stay (HOSPITAL_COMMUNITY): Payer: PPO

## 2021-06-11 DIAGNOSIS — E871 Hypo-osmolality and hyponatremia: Secondary | ICD-10-CM

## 2021-06-11 DIAGNOSIS — I480 Paroxysmal atrial fibrillation: Secondary | ICD-10-CM

## 2021-06-11 LAB — CBC
HCT: 39.4 % (ref 36.0–46.0)
Hemoglobin: 12.9 g/dL (ref 12.0–15.0)
MCH: 30 pg (ref 26.0–34.0)
MCHC: 32.7 g/dL (ref 30.0–36.0)
MCV: 91.6 fL (ref 80.0–100.0)
Platelets: 418 10*3/uL — ABNORMAL HIGH (ref 150–400)
RBC: 4.3 MIL/uL (ref 3.87–5.11)
RDW: 12.7 % (ref 11.5–15.5)
WBC: 17.6 10*3/uL — ABNORMAL HIGH (ref 4.0–10.5)
nRBC: 0 % (ref 0.0–0.2)

## 2021-06-11 LAB — COMPREHENSIVE METABOLIC PANEL
ALT: 14 U/L (ref 0–44)
AST: 19 U/L (ref 15–41)
Albumin: 3.1 g/dL — ABNORMAL LOW (ref 3.5–5.0)
Alkaline Phosphatase: 77 U/L (ref 38–126)
Anion gap: 7 (ref 5–15)
BUN: 16 mg/dL (ref 8–23)
CO2: 23 mmol/L (ref 22–32)
Calcium: 8.7 mg/dL — ABNORMAL LOW (ref 8.9–10.3)
Chloride: 104 mmol/L (ref 98–111)
Creatinine, Ser: 0.61 mg/dL (ref 0.44–1.00)
GFR, Estimated: >60 mL/min (ref >60)
Glucose, Bld: 120 mg/dL — ABNORMAL HIGH (ref 70–99)
Potassium: 3.5 mmol/L (ref 3.5–5.1)
Sodium: 134 mmol/L — ABNORMAL LOW (ref 135–145)
Total Bilirubin: 0.7 mg/dL (ref 0.3–1.2)
Total Protein: 6.1 g/dL — ABNORMAL LOW (ref 6.5–8.1)

## 2021-06-11 LAB — GLUCOSE, CAPILLARY
Glucose-Capillary: 124 mg/dL — ABNORMAL HIGH (ref 70–99)
Glucose-Capillary: 131 mg/dL — ABNORMAL HIGH (ref 70–99)
Glucose-Capillary: 126 mg/dL — ABNORMAL HIGH (ref 70–99)

## 2021-06-11 LAB — MAGNESIUM: Magnesium: 2 mg/dL (ref 1.7–2.4)

## 2021-06-11 MED ORDER — METOPROLOL TARTRATE 12.5 MG HALF TABLET
12.5000 mg | ORAL_TABLET | Freq: Two times a day (BID) | ORAL | Status: DC
  Administered 2021-06-11 – 2021-06-12 (×4): 12.5 mg via ORAL
  Filled 2021-06-11 (×5): qty 1

## 2021-06-11 MED ORDER — ATORVASTATIN CALCIUM 40 MG PO TABS
40.0000 mg | ORAL_TABLET | Freq: Every day | ORAL | Status: DC
  Administered 2021-06-12 – 2021-06-13 (×2): 40 mg via ORAL
  Filled 2021-06-11 (×2): qty 1

## 2021-06-11 NOTE — Plan of Care (Signed)
  Problem: Education: Goal: Knowledge of disease or condition will improve Outcome: Progressing Goal: Knowledge of secondary prevention will improve (SELECT ALL) Outcome: Progressing Goal: Knowledge of patient specific risk factors will improve (INDIVIDUALIZE FOR PATIENT) Outcome: Progressing Goal: Individualized Educational Video(s) Outcome: Progressing   Problem: Ischemic Stroke/TIA Tissue Perfusion: Goal: Complications of ischemic stroke/TIA will be minimized Outcome: Progressing

## 2021-06-11 NOTE — Progress Notes (Signed)
Modified Barium Swallow Progress Note  Patient Details  Name: Mckenzie Guerrero MRN: [id number] Date of Birth: Nov 20, 1950  Today's Date: 06/11/2021  Modified Barium Swallow completed.  Full report located under Chart Review in the Imaging Section.  Brief recommendations include the following:  Clinical Impression  Pt demosntrates a severe oral dysphagia due primarily to cognitive impairment. Pt is very poorly aware of efforts to feed her and only minimally responds to hand over hand assisted feeding. It is very difficult to introduce liquids orally as pt does not fully open mouth to accept them and blocks entrance with tongue. If bolus can be inserted (via cup, spoon or syringe) pt does initiate lingual pumping and will eventually trigger a normal pharyngeal swallow once she gets some sensory feedback from bolus on base of tongue. Recommend pt initiate a puree/thin diet though adequate oral intake will be limited and effortful. Recommend a palliative consult for this pt and family to discuss plan of care given expectation of poor nutritional intake.   Swallow Evaluation Recommendations       SLP Diet Recommendations: Dysphagia 1 (Puree) solids;Thin liquid   Liquid Administration via: Cup;Other (Comment);Spoon (syringe)   Medication Administration: Crushed with puree   Supervision: Full supervision/cueing for compensatory strategies       Postural Changes: Seated upright at 90 degrees   Oral Care Recommendations: Oral care BID   Other Recommendations: Have oral suction available    Mckenzie Guerrero, Mckenzie Guerrero 06/11/2021,2:38 PM

## 2021-06-11 NOTE — Care Management Important Message (Signed)
Important Message  Patient Details  Name: Mckenzie Guerrero MRN: [id number] Date of Birth: 1951/03/08   Medicare Important Message Given:  Yes     Airi Copado 06/11/2021, 1:50 PM

## 2021-06-11 NOTE — PMR Pre-admission (Signed)
PMR Admission Coordinator Pre-Admission Assessment  Patient: Mckenzie Guerrero is an 70 y.o., female MRN: [id number] DOB: 08/26/1950 Height: 5' 5" (165.1 cm) Weight: 61.4 kg  Insurance Information HMO:     PPO: Yes     PCP:       IPA:       80/20:       OTHER:   PRIMARY: [employer]   Policy#: [id number]      Subscriber: patient CM Name: Colletta [place]      Phone#: [phone]     Fax#: [phone] Pre-Cert#: Approval # [id number] Received approval on 06/12/21 from [place]. Pt approved for 7 days.      Employer: Retired Dealer:  Phone #: [phone]     Name: April Eff. Date: 10/11/15     Deduct: $0      Out of Pocket Max: $3400 (met $352.36)      Life Max: N/A CIR: $325 days 1-6      SNF: $0 days 1-20; $184 days 21-100 Outpatient: medical necessity     Co-Pay: $15/visit Home Health: 100%      Co-Pay: none DME: 80%     Co-Pay: 20% Providers: in network  SECONDARY: Medicaid of [place]      Policy#: [id number]     Phone#: [phone]  Financial Counselor:        Phone#:    The "Data Collection Information Summary" for patients in Inpatient Rehabilitation Facilities with attached "Privacy Act Burket Records" was provided and verbally reviewed with: Patient and Family  Emergency Contact Information Contact Information     Name Relation Home Work [place] Son [phone]  [phone]   [name] [phone]  [phone]   Glorian, Mcdonell [phone]  [phone]   Brittley, Regner [phone]  [phone]       Current Medical History  Patient Admitting Diagnosis: L MCA infarct  History of Present Illness:  A 70 y.o. female with medical history significant of migraine headaches, bleeding ulcers, hypertension, tremors, hiatal hernia and GERD status post recent laparoscopic Nissen fundoplication on 03/54/6568.  She presents to the ED today via EMS as code stroke after family found her in her recliner at home with aphasia, right hemiplegia,  right facial droop, and left gaze preference. LKN 0900 per family when they spoke to her on the phone.  Later in the day family was not able to contact her by phone and she was not answering her door, so a family member went into the house and open the door with a spare key to check on her.  When he entered, he found her as above and EMS was called.  EMS noted the same on scene.  Code stroke was called and on arrival to the ED patient's condition was unchanged.  CT head revealed acute to early subacute left MCA territory infarct without evidence of hemorrhagic transformation.  CTA head and neck with CTP: No intracranial LVO.  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  Out of tPA window at the time of presentation.  Not a thrombectomy candidate due to large completed stroke.  Labs notable for WBC 15.4, platelet count 442k.   Complete NIHSS TOTAL: 14  Patient's medical [facility] from [facility] has been reviewed by the rehabilitation admission coordinator and physician.  Past Medical History  Past Medical History:  Diagnosis Date   Anxiety  Arthritis    osteoarthritis   Complication of anesthesia    GERD (gastroesophageal reflux disease)    Headache    migraines   History of bleeding ulcers 2010   History of hiatal hernia    Hypertension    Neuromuscular disorder (HCC)    tremors   Occasional tremors    PONV (postoperative nausea and vomiting)     Has the patient had major surgery during 100 days prior to admission? Yes  Family History   family history is not on file.  Current Medications  Current Facility-Administered Medications:    acetaminophen (TYLENOL) tablet 650 mg, 650 mg, Oral, Q4H PRN, 650 mg at 06/12/21 2118 **OR** acetaminophen (TYLENOL) 160 MG/5ML solution 650 mg, 650 mg, Per Tube, Q4H PRN **OR** acetaminophen (TYLENOL) suppository 650 mg, 650 mg, Rectal, Q4H  PRN, Shela Leff, MD   aspirin chewable tablet 81 mg, 81 mg, Oral, Daily, Gherghe, Costin M, MD, 81 mg at 06/12/21 1038   atorvastatin (LIPITOR) tablet 40 mg, 40 mg, Oral, Daily, Rosalin Hawking, MD, 40 mg at 06/12/21 1038   Chlorhexidine Gluconate Cloth 2 % PADS 6 each, 6 each, Topical, Daily, Chotiner, Yevonne Aline, MD, 6 each at 06/12/21 1044   heparin injection 5,000 Units, 5,000 Units, Subcutaneous, Q8H, Rosalin Hawking, MD, 5,000 Units at 06/13/21 0654   labetalol (NORMODYNE) injection 5 mg, 5 mg, Intravenous, Q2H PRN, Hall, Carole N, DO, 5 mg at 06/09/21 1148   metoprolol tartrate (LOPRESSOR) tablet 12.5 mg, 12.5 mg, Oral, BID, Cruzita Lederer, Costin M, MD, 12.5 mg at 06/12/21 2107  Patients Current Diet:  Diet Order             DIET - DYS 1 Room service appropriate? Yes; Fluid consistency: Thin  Diet effective now                   Precautions / Restrictions Precautions Precautions: Fall Precaution Comments: Pt with recent R hernia repairl R inattention, aphasic Restrictions Weight Bearing Restrictions: No   Has the patient had 2 or more falls or a fall with injury in the past year? Yes  Prior Activity Level Community (5-7x/wk): Went out daily.  Was driving.  Retired 1-2 yrs ago.  Prior Functional Level Self Care: Did the patient need help bathing, dressing, using the toilet or eating? Independent  Indoor Mobility: Did the patient need assistance with walking from room to room (with or without device)? Independent  Stairs: Did the patient need assistance with internal or external stairs (with or without device)? Independent  Functional Cognition: Did the patient need help planning regular tasks such as shopping or remembering to take medications? Independent  Patient Information Are you of Hispanic, Latino/a,or Spanish origin?: A. No, not of Hispanic, Latino/a, or Spanish origin What is your race?: A. White Do you need or want an interpreter to communicate with a doctor or  health care staff?: 0. No  Patient's Response To:  Health Literacy and Transportation Is the patient able to respond to health literacy and transportation needs?: Yes Health Literacy - How often do you need to have someone help you when you read instructions, pamphlets, or other written material from your doctor or pharmacy?: Never In the past 12 months, has lack of transportation kept you from medical appointments or from getting medications?: No In the past 12 months, has lack of transportation kept you from meetings, work, or from getting things needed for daily living?: No  Development worker, international aid / Equipment Home Equipment: None  Prior Device Use: Indicate devices/aids used by the patient prior to current illness, exacerbation or injury? None of the above  Current Functional Level Cognition  Arousal/Alertness: Lethargic Overall Cognitive Status: Impaired/Different from baseline Difficult to assess due to: Impaired communication Current Attention Level: Focused Orientation Level: Oriented to person Following Commands: Follows one step commands inconsistently Safety/Judgement: Decreased awareness of safety, Decreased awareness of deficits General Comments: increased time to follow commands Attention: Focused Focused Attention: Impaired Focused Attention Impairment: Verbal basic, Functional basic Awareness: Impaired Awareness Impairment: Intellectual impairment Problem Solving: Impaired Problem Solving Impairment: Verbal basic, Functional basic Safety/Judgment: Impaired    Extremity Assessment (includes Sensation/Coordination)  Upper Extremity Assessment: Difficult to assess due to impaired cognition (difficult to assess due to limited command following. BUE PROM is [facility]. Pt reaching for functional items (comb) with L hand, noted RUE movement with bed mobility and transferring)  Lower Extremity Assessment: Defer to PT evaluation RLE Deficits / Details: Pt with non purposeful  movement at times. Also would move RLE when PT was assisting. Could not respond to verbal cues to move RLE appropriately. Did not have response to painful stimuli    ADLs  Overall ADL's : Needs assistance/impaired Eating/Feeding: NPO Grooming: Wash/dry hands, Wash/dry face, Minimal assistance, Sitting, Cueing for sequencing Grooming Details (indicate cue type and reason): Patient stood at sink for grooming tasks. Upper Body Bathing: Sitting, Moderate assistance Lower Body Bathing: Maximal assistance, +2 for safety/equipment, Sit to/from stand Upper Body Dressing : Moderate assistance, Sitting Upper Body Dressing Details (indicate cue type and reason): doffed old gown and donned new gown Lower Body Dressing: Maximal assistance, +2 for safety/equipment, Sit to/from stand Toilet Transfer: Moderate assistance, +2 for safety/equipment, Regular Toilet, Ambulation, Grab bars Toilet Transfer Details (indicate cue type and reason): required assistance of 2 due to equipment Toileting- Clothing Manipulation and Hygiene: Maximal assistance, Sit to/from stand Toileting - Clothing Manipulation Details (indicate cue type and reason): patient used rail for balance during toilet hygiene Functional mobility during ADLs: Moderate assistance, +2 for safety/equipment General ADL Comments: Patient required increased time and often repeated cues to follow commands    Mobility  Overal bed mobility: Needs Assistance Bed Mobility: Sit to Supine Supine to sit: Mod assist, HOB elevated Sit to supine: Mod assist General bed mobility comments: required assistance with BLEs    Transfers  Overall transfer level: Needs assistance Equipment used: 1 person hand held assist Transfers: Sit to/from Stand Sit to Stand: Mod assist Bed to/from chair/wheelchair/BSC transfer type:: Step pivot Step pivot transfers: Mod assist General transfer comment: for safety +2 for equipment with mobilty and transfers    Ambulation /  Gait / Stairs / Wheelchair Mobility  Ambulation/Gait Ambulation/Gait assistance: Mod assist, +2 safety/equipment Gait Distance (Feet): 120 Feet Assistive device: 1 person hand held assist, 2 person hand held assist Gait Pattern/deviations: Step-to pattern, Step-through pattern, Decreased stride length, Ataxic, Drifts right/left General Gait Details: hand hold on L and cues for forward gaze, son helped with telemetry box and spouse walked with pt as well at times holding R elbow    Posture / Balance Dynamic Sitting Balance Sitting balance - Comments: right lateral lean when on EOB Balance Overall balance assessment: Needs assistance Sitting-balance support: Feet supported Sitting balance-Leahy Scale: Poor Sitting balance - Comments: right lateral lean when on EOB Postural control: Right lateral lean Standing balance support: Bilateral upper extremity supported Standing balance-Leahy Scale: Poor Standing balance comment: used rail in bathroom to assist with balance and used sink  Special needs/care consideration Skin Post op abdominal incisions with dressings in place.   Previous Home Environment (from acute therapy documentation) Living Arrangements: Alone Available Help at Discharge: Family Type of Home: House Home Layout: Two level Alternate Level Stairs-Rails: Right Alternate Level Stairs-Number of Steps: flight Home Access: Stairs to enter Entrance Stairs-Rails: None Entrance Stairs-Number of Steps: 1 Bathroom Shower/Tub: Multimedia programmer: Standard  Discharge Living Setting Plans for Discharge Living Setting: Patient's home, [place] (Family to work out 24/7 plan for supervision) Type of Home at Discharge: House Discharge Home Layout: Two level, Other (Comment) (Bedroom main level, sink upstairs in kitchen.) Alternate Level Stairs-Number of Steps: 10-13 steps Discharge Home Access: Level entry Discharge Bathroom Shower/Tub: Walk-in shower,  Curtain Discharge Bathroom Toilet: Standard Discharge Bathroom Accessibility: Yes How Accessible: Accessible via walker  Social/Family/Support Systems Patient Roles: Parent (Has sons and daughter in laws.) Contact Information: Rifka Ramey - son - [phone] Anticipated Caregiver: sons Ability/Limitations of Caregiver: Sons to coordinate 24/7 caregiver support after rehab stay Caregiver Availability: Other (Comment) (Family aware of need for 24/7 supervision after discharge) Discharge Plan Discussed with Primary Caregiver: Yes Is Caregiver In Agreement with Plan?: Yes Does Caregiver/Family have Issues with Lodging/Transportation while Pt is in Rehab?: No  Goals Patient/Family Goal for Rehab: PT/OT/SLP supervision goals Expected length of stay: 12 days Pt/Family Agrees to Admission and willing to participate: Yes Program Orientation Provided & Reviewed with Pt/Caregiver Including Roles  & Responsibilities: Yes  Decrease burden of Care through IP rehab admission: N/A  Possible need for SNF placement upon discharge: Not planned  Patient Condition: I have reviewed medical records from [facility], spoken with CM, and patient, son, and family member. I met with patient at the bedside for inpatient rehabilitation assessment.  Patient will benefit from ongoing PT, OT, and SLP, can actively participate in 3 hours of therapy a day 5 days of the week, and can make measurable gains during the admission.  Patient will also benefit from the coordinated team approach during an Inpatient Acute Rehabilitation admission.  The patient will receive intensive therapy as well as Rehabilitation physician, nursing, social worker, and care management interventions.  Due to bladder management, bowel management, safety, skin/wound care, disease management, medication administration, pain management, and patient education the patient requires 24 hour a day rehabilitation nursing.  The patient is currently Mod A with  mobility and Mod A with basic ADLs.  Discharge setting and therapy post discharge at home with home health is anticipated.  Patient has agreed to participate in the Acute Inpatient Rehabilitation Program and will admit today.  Preadmission Screen Completed By:  Retta Diones, with updates by Gayland Curry 06/13/2021 8:58 AM ______________________________________________________________________   Discussed status with Dr. Posey Pronto on 06/13/21  at 8:58 AM and received approval for admission today.  Admission Coordinator:  Retta Diones, RN, with updates by Gayland Curry, MS, CCC-SLP time 8:58 AM/Date 06/13/21    Assessment/Plan: Diagnosis: L MCA infarct Does the need for close, 24 hr/day Medical supervision in concert with the patient's rehab needs make it unreasonable for this patient to be served in a less intensive setting? Yes Co-Morbidities requiring supervision/potential complications: migraine headaches, bleeding ulcers, hypertension, tremors, hiatal hernia and GERD status post recent laparoscopic Nissen fundoplication  Due to bladder management, safety, disease management, medication administration, and patient education, does the patient require 24 hr/day rehab nursing? Yes Does the patient require coordinated care of a physician, rehab nurse, PT, OT, and SLP to address  physical and functional deficits in the context of the above medical diagnosis(es)? Yes Addressing deficits in the following areas: balance, endurance, locomotion, strength, transferring, bathing, dressing, toileting, cognition, swallowing, and psychosocial support Can the patient actively participate in an intensive therapy program of at least 3 hrs of therapy 5 days a week? Yes The potential for patient to make measurable gains while on inpatient rehab is excellent Anticipated functional outcomes upon discharge from inpatient rehab: supervision and min assist PT, supervision and min assist OT, supervision and  min assist SLP Estimated rehab length of stay to reach the above functional goals is: 12-15 days. Anticipated discharge destination: Home 10. Overall Rehab/Functional Prognosis: good   MD Signature: Delice Lesch, MD, ABPMR

## 2021-06-11 NOTE — Progress Notes (Signed)
IP rehab admissions - I met at the bedside with patient, her son and her daughter-in-law.  Son called other siblings and they are in agreement to inpatient rehab admission.  Patient has HTA insurance and we will need authorization for CIR.  I will fax information to request CIR admission.  Call me for questions.  [phone]

## 2021-06-11 NOTE — Progress Notes (Signed)
STROKE TEAM PROGRESS NOTE   INTERVAL HISTORY Son is at the bedside.  Patient lying in bed, eyes open, awake alert.  Still has left gaze preference but tracking bilaterally.  Right upper and lower extremity strength much improved, able to against gravity.  However, still has global aphasia and right hemianopia.  Telemetry monitoring showed A. fib and captured also by EKG.  Put on low-dose metoprolol for rate control.  Will consider anticoagulation 10 to 14 days post stroke due to large infarct with petechial hemorrhage   Vitals:   06/11/21 0400 06/11/21 0745 06/11/21 1324 06/11/21 1522  BP:   111/76 122/70  Pulse:   77 72  Resp: 20 18    Temp: 98 F (36.7 C) 98 F (36.7 C) 98 F (36.7 C) 98 F (36.7 C)  TempSrc: Oral Oral Oral Oral  SpO2:   98% 97%  Weight:      Height:       CBC:  Recent Labs  Lab 06/08/21 1839 06/08/21 1844 06/09/21 0433 06/11/21 0120  WBC 15.4*  --  21.9* 17.6*  NEUTROABS 12.5*  --   --   --   HGB 13.0   < > 14.6 12.9  HCT 39.9   < > 44.7 39.4  MCV 93.0  --  91.6 91.6  PLT 442*  --  515* 418*   < > = values in this interval not displayed.   Basic Metabolic Panel:  Recent Labs  Lab 06/08/21 1839 06/08/21 1844 06/11/21 0120  NA 136 138 134*  K 3.5 3.6 3.5  CL 105 107 104  CO2 23  --  23  GLUCOSE 125* 121* 120*  BUN 10 10 16   CREATININE 0.61 0.50 0.61  CALCIUM 8.7*  --  8.7*  MG  --   --  2.0     Lipid Panel:  Recent Labs  Lab 06/09/21 0433  CHOL 200  TRIG 75  HDL 46  CHOLHDL 4.3  VLDL 15  LDLCALC 139*    HgbA1c:  Recent Labs  Lab 06/09/21 0433  HGBA1C 5.8*   Urine Drug Screen: No results for input(s): LABOPIA, COCAINSCRNUR, LABBENZ, AMPHETMU, THCU, LABBARB in the last 168 hours.  Alcohol Level No results for input(s): ETH in the last 168 hours.  IMAGING past 24 hours DG Swallowing Func-Speech Pathology  Result Date: 06/11/2021 Table formatting from the original result was not included. Objective Swallowing Evaluation: Type of  Study: MBS-Modified Barium Swallow Study  Patient Details Name: Mckenzie Guerrero MRN: [id number] Date of Birth: [birth year] Today's Date: 06/11/2021 Time: SLP Start Time (ACUTE ONLY): 1200 -SLP Stop Time (ACUTE ONLY): 7824 SLP Time Calculation (min) (ACUTE ONLY): 20 min Past Medical History: Past Medical History: Diagnosis Date  Anxiety   Arthritis   osteoarthritis  Complication of anesthesia   GERD (gastroesophageal reflux disease)   Headache   migraines  History of bleeding ulcers 2010  History of hiatal hernia   Hypertension   Neuromuscular disorder (HCC)   tremors  Occasional tremors   PONV (postoperative nausea and vomiting)  Past Surgical History: Past Surgical History: Procedure Laterality Date  ANTERIOR CERVICAL DECOMP/DISCECTOMY FUSION N/A 09/28/2019  Procedure: Anterior Cervical Discectomy Fusion - Cervical Four-Cervical Five- Cervical Five-Cervical Six;  Surgeon: Earnie Larsson, MD;  Location: [facility];  Service: Neurosurgery;  Laterality: N/A;  Anterior Cervical Discectomy Fusion - Cervical Four-Cervical Five- Cervical Five-Cervical Six  benign tumor removal from left breast 2003  2003  BUNIONECTOMY Left   Great toe  COLONOSCOPY  ESOPHAGOGASTRODUODENOSCOPY N/A 05/25/2021  Procedure: ESOPHAGOGASTRODUODENOSCOPY (EGD);  Surgeon: Johnathan Hausen, MD;  Location: [facility];  Service: General;  Laterality: N/A;  XI ROBOTIC ASSISTED HIATAL HERNIA REPAIR N/A 05/25/2021  Procedure: XI ROBOTIC ASSISTED HIATAL HERNIA REPAIR WITH FUNDOPLICATION;  Surgeon: Johnathan Hausen, MD;  Location: [facility];  Service: General;  Laterality: N/A; HPI: Mckenzie Guerrero is a 70 y.o. female presenting after after family found her in her recliner at home with aphasia, right hemiplegia, right facial droop, and left gaze preference. CT head revealed acute to early subacute left MCA territory infarct without evidence of hemorrhagic transformation. Pt with medical history significant of migraine headaches, bleeding ulcers, hypertension, tremors, hiatal  hernia and GERD status post recent laparoscopic Nissen fundoplication on 43/15/4008.  No data recorded  Recommendations for follow up therapy are one component of a multi-disciplinary discharge planning process, led by the attending physician.  Recommendations may be updated based on patient status, additional functional criteria and insurance authorization. Assessment / Plan / Recommendation Clinical Impressions 06/11/2021 Clinical Impression Pt demosntrates a severe oral dysphagia due primarily to cognitive impairment. Pt is very poorly aware of efforts to feed her and only minimally responds to hand over hand assisted feeding. It is very difficult to introduce liquids orally as pt does not fully open mouth to accept them and blocks entrance with tongue. If bolus can be inserted (via cup, spoon or syringe) pt does initiate lingual pumping and will eventually trigger a normal pharyngeal swallow once she gets some sensory feedback from bolus on base of tongue. Recommend pt initiate a puree/thin diet though adequate oral intake will be limited and effortful. Recommend a palliative consult for this pt and family to discuss plan of care given expectation of poor nutritional intake SLP Visit Diagnosis Dysphagia, oral phase (R13.11) Attention and concentration deficit following -- Frontal lobe and executive function deficit following -- Impact on safety and function Risk for inadequate nutrition/hydration   Treatment Recommendations 06/11/2021 Treatment Recommendations Therapy as outlined in treatment plan below   No flowsheet data found. Diet Recommendations 06/11/2021 SLP Diet Recommendations Dysphagia 1 (Puree) solids;Thin liquid Liquid Administration via Cup;Other (Comment);Spoon Medication Administration Crushed with puree Compensations -- Postural Changes Seated upright at 90 degrees   Other Recommendations 06/11/2021 Recommended Consults -- Oral Care Recommendations Oral care BID Other Recommendations Have oral  suction available Follow Up Recommendations Acute inpatient rehab (3hours/day) Assistance recommended at discharge Frequent or constant Supervision/Assistance Functional Status Assessment Patient has had a recent decline in their functional status and demonstrates the ability to make significant improvements in function in a reasonable and predictable amount of time. Frequency and Duration  06/10/2021 Speech Therapy Frequency (ACUTE ONLY) min 2x/week Treatment Duration --   Oral Phase 06/11/2021 Oral Phase Impaired Oral - Pudding Teaspoon -- Oral - Pudding Cup -- Oral - Honey Teaspoon -- Oral - Honey Cup -- Oral - Nectar Teaspoon -- Oral - Nectar Cup -- Oral - Nectar Straw -- Oral - Thin Teaspoon -- Oral - Thin Cup Left anterior bolus loss;Right anterior bolus loss;Lingual pumping;Holding of bolus;Left pocketing in lateral sulci;Right pocketing in lateral sulci;Lingual/palatal residue;Delayed oral transit;Decreased bolus cohesion Oral - Thin Straw -- Oral - Puree Left anterior bolus loss;Right anterior bolus loss;Lingual pumping;Holding of bolus;Left pocketing in lateral sulci;Right pocketing in lateral sulci;Lingual/palatal residue;Delayed oral transit;Decreased bolus cohesion Oral - Mech Soft -- Oral - Regular -- Oral - Multi-Consistency -- Oral - Pill -- Oral Phase - Comment --  Pharyngeal Phase 06/11/2021 Pharyngeal Phase WFL Pharyngeal- Pudding  Teaspoon -- Pharyngeal -- Pharyngeal- Pudding Cup -- Pharyngeal -- Pharyngeal- Honey Teaspoon -- Pharyngeal -- Pharyngeal- Honey Cup -- Pharyngeal -- Pharyngeal- Nectar Teaspoon -- Pharyngeal -- Pharyngeal- Nectar Cup -- Pharyngeal -- Pharyngeal- Nectar Straw -- Pharyngeal -- Pharyngeal- Thin Teaspoon -- Pharyngeal -- Pharyngeal- Thin Cup -- Pharyngeal -- Pharyngeal- Thin Straw -- Pharyngeal -- Pharyngeal- Puree -- Pharyngeal -- Pharyngeal- Mechanical Soft -- Pharyngeal -- Pharyngeal- Regular -- Pharyngeal -- Pharyngeal- Multi-consistency -- Pharyngeal -- Pharyngeal- Pill  -- Pharyngeal -- Pharyngeal Comment --  No flowsheet data found. DeBlois, Katherene Ponto 06/11/2021, 2:41 PM                      PHYSICAL EXAM  Temp:  [98 F (36.7 C)-98.7 F (37.1 C)] 98 F (36.7 C) (12/01 1522) Pulse Rate:  [60-77] 72 (12/01 1522) Resp:  [18-20] 18 (12/01 0745) BP: (111-122)/(70-76) 122/70 (12/01 1522) SpO2:  [97 %-98 %] 97 % (12/01 1522)  General - Well nourished, well developed, not in acute distress  Ophthalmologic - fundi not visualized due to noncooperation.  Cardiovascular - irregularly irregular heart rate and rhythm.  Neuro - awake, alert, eyes open, however, still global aphasia, nonverbal, not following simple commands.  Not able to name and repeat. No gaze palsy, tracking bilaterally, however left gaze preference.  Not blinking to visual threat on the right but blinking to visual threat on the left. PERRL.  Mild right facial droop. Tongue midline not corporative. LUE and LLE no drift, right upper extremity against gravity but drifts to bed within 10 seconds, right lower extremity against gravity no drift but weaker than left. Sensation, coordination and gait not tested.    ASSESSMENT/PLAN Mckenzie Guerrero is a 70 y.o. female with history of migraine headaches, anxiety, gleeding ulcers, HTN and tremor, presenting to the ED via EMS as a Code Stroke after family found her in her recliner at home with aphasia, right hemiplegia, right facial droop and left gaze preference.  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 fib CT head : acute to early subacute left MCA territorial infarct without hemorrhagic transformation. Aspects 4.  CTA head & neck  - No intracranial large vessel occlusion. 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 CT perfusion  - 42 mL left MCA territory core infarct with 14 mL surrounding ischemic penumbra. MRI Evolving  left MCA territorial infarct with evidence of hemorrhagic transformation and trace rightward midline shift. CT repeat large MCA infarct with petechial hemorrhage, no signal change slightly increased intracranial mass-effect, 3 mm midline shift will repeat CT in am to evaluate cerebral edema 2D Echo EF 60 to 65% LE venous Doppler no DVT LDL 139 HgbA1c 5.8 VTE prophylaxis - Heparin subcu No antithrombotic prior to admission, now on aspirin 81 given large stroke with confluent medial hemorrhage.  Will consider DOAC 10-14 days post stroke if neuro stable Therapy recommendations:  CIR Disposition:  pending   New diagnosed A. Fib Telemetry and EKG confirmed A. Fib Likely the cause of current stroke Low-dose metoprolol 12.5 twice daily for rate control now on aspirin 81 given large stroke with confluent medial hemorrhage.  Will consider DOAC 10-14 days post stroke if neuro stable  Hypertension Home meds:  ramipril stable Long-term BP goal normotensive  Hyperlipidemia Home meds:  none LDL 139, goal < 70 Add lipitor 40 Continue statin at discharge  Dysphagia  on dysphagia 1 and thin liquid Speech on board Off IV fluid  Other Stroke Risk Factors Advanced Age >/= 90  Migraines     Other Active Problems Leukocytosis WBC 15.4 -> pending hiatal hernia   Hospital day # 3   Rosalin Hawking, MD PhD Stroke Neurology 06/11/2021 5:38 PM

## 2021-06-11 NOTE — Progress Notes (Signed)
PROGRESS NOTE  Mckenzie Guerrero ZHY:[id number] DOB: 07/18/1950 DOA: 06/08/2021 PCP: Raina Mina., MD   LOS: 3 days   Brief Narrative / Interim history: 70 year old female with history of migraine headaches, bleeding ulcers, HTN, hiatal hernia status post recent laparoscopic Nissen fundoplication on 29/52/8413 comes into the hospital on 11/28 as a code stroke after the family found her in the recliner at home.  Son says that she was alert, her eyes were open, had facial droop and could not move the right side of the body.  Last known well is at 9 AM that morning, and son found her around 5 PM in the afternoon.  She was brought to the ED as a code stroke and work-up revealed a large left MCA territory ischemic infarction without any LVO.  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  She was out of the window for tPA without presentation and not a thrombectomy candidate due to large complex stroke with aspects of 3.  Subjective / 24h Interval events: She is more alert today, eyes are open but remains aphasic  Assessment & Plan: Principal Problem:   Acute CVA (cerebrovascular accident) ([facility]) Active Problems:   HTN (hypertension)   Leukocytosis   Hyperlipidemia, unspecified   History of migraine   Thrombocytosis   History of repair of hiatal hernia   AF (paroxysmal atrial fibrillation) (HCC)   Hyponatremia   Principal Problem Acute large left MCA stroke likely secondary due to thrombosis, with hemorrhagic transformation -with aphasia, right hemiplegia, left gaze preference-CT scan on admission showed acute to early subacute left MCA territory infarct without hemorrhagic transformation.  An MRI of the brain obtained later on showed evolving left MCA territory infarct with evidence of hemorrhagic transformation trace rightward midline shift.  Neurology consulted.  She failed swallow eval and currently she is on rectal aspirin.  LDL 139, continue high  intensity statin once she passes a swallow eval.  A1c 5.8 suggesting prediabetes.  2D echo showed EF 60-65%, no WMA, grade 1 diastolic dysfunction.  RV is normal.  No intracardiac source of embolus detected.  Lower extremity Doppler ultrasound was negative for DVT. -Repeat CT scan 11/30 identifies again large left MCA territory with multifocal confluent petechial hemorrhages without significant changes, but it did show some increased intracranial mass-effect with a 3 mm rightward midline shift -Appreciate neurology follow-up, per neurology may need a repeat CT scan 12/2  Active Problems Hyperlipidemia-continue high intensity statin once passes swallow eval Prediabetes-A1c 5.8 Paroxysmal atrial fibrillation-on admission she was in sinus rhythm but overnight seems to be in A. fib/flutter.  Rates in the low 100s.  Start metoprolol.  Defer anticoagulation to neurology Leukocytosis-possibly reactive, she did have leukocytosis on 11/15 around her surgery, which improved.  Improving today Hiatal hernia-status post robotic repair with fundoplication on 24/40 1027. Thrombocytosis-possibly reactive Hyponatremia-mild, monitor Essential hypertension-blood pressure stable, start metoprolol low-dose as above, should not affect blood pressure too much History of migraines-monitor  Scheduled Meds:  aspirin  81 mg Oral Daily   atorvastatin  80 mg Oral Daily   heparin injection (subcutaneous)  5,000 Units Subcutaneous Q8H   Continuous Infusions:  dextrose 5% lactated ringers 50 mL/hr at 06/11/21 0251   PRN Meds:.acetaminophen **OR** acetaminophen (TYLENOL) oral liquid 160 mg/5 mL **OR** acetaminophen, labetalol  Diet Orders (From admission, onward)     Start     Ordered   06/10/21 1337  Diet NPO time specified Except for: [employer], Other (  See Comments)  Diet effective now       Comments: Meds crushed in puree  Question Answer Comment  Except for [employer]   Except for Other (See Comments)       06/10/21 1337            DVT prophylaxis: heparin injection 5,000 Units Start: 06/10/21 2200 SCD's Start: 06/08/21 2222     Code Status: Full Code  Family Communication: No family at bedside  Status is: Inpatient  Remains inpatient appropriate because: Persistent symptoms  Level of care: Progressive  Consultants:  Neurology   Procedures:  2d echo  Microbiology  none  Antimicrobials: none    Objective: Vitals:   06/10/21 1945 06/10/21 2328 06/11/21 0400 06/11/21 0745  BP:      Pulse: 60 60    Resp: 18  20 18   Temp: 98.1 F (36.7 C) 98.7 F (37.1 C) 98 F (36.7 C) 98 F (36.7 C)  TempSrc: Oral Oral Oral Oral  SpO2: 97% 97%    Weight:      Height:        Intake/Output Summary (Last 24 hours) at 06/11/2021 1110 Last data filed at 06/11/2021 0215 Gross per 24 hour  Intake 483.37 ml  Output 1000 ml  Net -516.63 ml    Filed Weights   06/08/21 1842 06/08/21 1922  Weight: 61.4 kg 61.4 kg    Examination:  Constitutional: No apparent distress Eyes: No scleral icterus ENMT: Moist mucous membranes Neck: normal, supple Respiratory: Clear to auscultation bilaterally without wheezing or crackles Cardiovascular: Regular rate and rhythm, no murmurs, no peripheral edema Abdomen: Soft, nontender, nondistended, bowel sounds positive Musculoskeletal: no clubbing / cyanosis.  Skin: No rashes appreciated Neurologic: Strength appears equal in her legs, flaccid right arm  Data Reviewed: I have independently reviewed following labs and imaging studies   CBC: Recent Labs  Lab 06/08/21 1839 06/08/21 1844 06/09/21 0433 06/11/21 0120  WBC 15.4*  --  21.9* 17.6*  NEUTROABS 12.5*  --   --   --   HGB 13.0 13.3 14.6 12.9  HCT 39.9 39.0 44.7 39.4  MCV 93.0  --  91.6 91.6  PLT 442*  --  515* 418*    Basic Metabolic Panel: Recent Labs  Lab 06/08/21 1839 06/08/21 1844 06/11/21 0120  NA 136 138 134*  K 3.5 3.6 3.5  CL 105 107 104  CO2 23  --  23  GLUCOSE  125* 121* 120*  BUN 10 10 16   CREATININE 0.61 0.50 0.61  CALCIUM 8.7*  --  8.7*  MG  --   --  2.0    Liver Function Tests: Recent Labs  Lab 06/08/21 1839 06/11/21 0120  AST 21 19  ALT 20 14  ALKPHOS 77 77  BILITOT 0.5 0.7  PROT 6.3* 6.1*  ALBUMIN 3.5 3.1*    Coagulation Profile: Recent Labs  Lab 06/08/21 1839  INR 1.1    HbA1C: Recent Labs    06/09/21 0433  HGBA1C 5.8*    CBG: Recent Labs  Lab 06/08/21 1838 06/09/21 1209 06/10/21 0145 06/10/21 1312 06/11/21 0647  GLUCAP 122* 132* 123* 133* 124*     Recent Results (from the past 240 hour(s))  Resp Panel by RT-PCR (Flu A&B, Covid) Urine, Clean Catch     Status: None   Collection Time: 06/08/21 10:45 PM   Specimen: Urine, Clean Catch; Nasopharyngeal(NP) swabs in vial transport medium  Result Value Ref Range Status   SARS Coronavirus 2 by RT  PCR NEGATIVE NEGATIVE Final    Comment: (NOTE) SARS-CoV-2 target nucleic acids are NOT DETECTED.  The SARS-CoV-2 RNA is generally detectable in upper respiratory specimens during the acute phase of infection. The lowest concentration of SARS-CoV-2 viral copies this assay can detect is 138 copies/mL. A negative result does not preclude SARS-Cov-2 infection and should not be used as the sole basis for treatment or other patient management decisions. A negative result may occur with  improper specimen collection/handling, submission of specimen other than nasopharyngeal swab, presence of viral mutation(s) within the areas targeted by this assay, and inadequate number of viral copies(<138 copies/mL). A negative result must be combined with clinical observations, patient history, and epidemiological information. The expected result is Negative.  Fact Sheet for Patients:  EntrepreneurPulse.com.au  Fact Sheet for Healthcare Providers:  IncredibleEmployment.be  This test is no t yet approved or cleared by the [place] FDA and   has been authorized for detection and/or diagnosis of SARS-CoV-2 by FDA under an Emergency Use Authorization (EUA). This EUA will remain  in effect (meaning this test can be used) for the duration of the COVID-19 declaration under Section 564(b)(1) of the Act, 21 U.S.C.section 360bbb-3(b)(1), unless the authorization is terminated  or revoked sooner.       Influenza A by PCR NEGATIVE NEGATIVE Final   Influenza B by PCR NEGATIVE NEGATIVE Final    Comment: (NOTE) The Xpert Xpress SARS-CoV-2/FLU/RSV plus assay is intended as an aid in the diagnosis of influenza from Nasopharyngeal swab specimens and should not be used as a sole basis for treatment. Nasal washings and aspirates are unacceptable for Xpert Xpress SARS-CoV-2/FLU/RSV testing.  Fact Sheet for Patients: EntrepreneurPulse.com.au  Fact Sheet for Healthcare Providers: IncredibleEmployment.be  This test is not yet approved or cleared by the [place] FDA and has been authorized for detection and/or diagnosis of SARS-CoV-2 by FDA under an Emergency Use Authorization (EUA). This EUA will remain in effect (meaning this test can be used) for the duration of the COVID-19 declaration under Section 564(b)(1) of the Act, 21 U.S.C. section 360bbb-3(b)(1), unless the authorization is terminated or revoked.  Performed at [facility], [place] [address] [place], [place] [address]       Radiology Studies: No results found.  Marzetta Board, MD, PhD [facility]  Between 7 am - 7 pm I am available, please contact me via Amion (for emergencies) or Securechat (non urgent messages)  Between 7 pm - 7 am I am not available, please contact night coverage MD/APP via Amion

## 2021-06-11 NOTE — Progress Notes (Signed)
Physical Therapy Treatment Patient Details Name: Mckenzie Guerrero MRN: [id number] DOB: 11/09/1950 Today's Date: 06/11/2021   History of Present Illness Pt is a 70 y/o female admitted 11/28 secondary to R weakness and decreased responsiveness. CT showed L MCA infarct. PMH includes HTN, migraines, tremors, recent hernia repair and s/p ACDF.    PT Comments    Patient progressing with ambulation and balance.  Noting less R lateral lean today with 2 person HHA to wheelchair and with hall rail on L hand side.  Still with episodes of scissoring at times.  Noted patient limited by either fatigue or headache as she held her head after walking a few feet.  Continue to recommend acute inpatient rehab at d/c.  PT to continue to follow acutely.    Recommendations for follow up therapy are one component of a multi-disciplinary discharge planning process, led by the attending physician.  Recommendations may be updated based on patient status, additional functional criteria and insurance authorization.  Follow Up Recommendations  Acute inpatient rehab (3hours/day)     Assistance Recommended at Discharge Frequent or constant Supervision/Assistance  Equipment Recommendations  Wheelchair (measurements PT);BSC/3in1;Hospital bed (if to d/c home)    Recommendations for Other Services       Precautions / Restrictions Precautions Precautions: Fall Precaution Comments: Pt with recent R hernia repairl R inattention, aphasic     Mobility  Bed Mobility Overal bed mobility: Needs Assistance Bed Mobility: Supine to Sit     Supine to sit: HOB elevated;Min assist     General bed mobility comments: initiating up to EOB after rail let down on L side, assist with L HHA to lift trunk    Transfers Overall transfer level: Needs assistance Equipment used: 2 person hand held assist;1 person hand held assist Transfers: Sit to/from Stand Sit to Stand: Mod assist;+2 safety/equipment           General  transfer comment: up from EOB initially HHA of 2 to step to wheelchair in front of her; stood in hallway with rail on L with HHA on R and increased time, verbal, visual cues, assist to place feet under her    Ambulation/Gait Ambulation/Gait assistance: Mod assist;+2 safety/equipment Gait Distance (Feet): 25 Feet (x 2) Assistive device: 1 person hand held assist (and wall rail) Gait Pattern/deviations: Step-to pattern;Narrow base of support;Scissoring       General Gait Details: R weight shift, but with rail on L steps farther with R foot and less episodes of scissoring (transported in w/c to hallway with rail on L   Stairs             Wheelchair Mobility    Modified Rankin (Stroke Patients Only) Modified Rankin (Stroke Patients Only) Pre-Morbid Rankin Score: No symptoms Modified Rankin: Moderately severe disability     Balance Overall balance assessment: Needs assistance Sitting-balance support: Feet supported Sitting balance-Leahy Scale: Fair Sitting balance - Comments: on EOB with S for safety, no LOB Postural control: Right lateral lean Standing balance support: Bilateral upper extremity supported Standing balance-Leahy Scale: Poor Standing balance comment: UE support and min A with R lateral bias                            Cognition Arousal/Alertness: Awake/alert Behavior During Therapy: Flat affect;Impulsive Overall Cognitive Status: Impaired/Different from baseline Area of Impairment: Attention;Following commands;Safety/judgement                   Current Attention  Level: Focused   Following Commands: Follows one step commands inconsistently Safety/Judgement: Decreased awareness of safety;Decreased awareness of deficits              Exercises      General Comments        Pertinent Vitals/Pain Pain Assessment: Faces Faces Pain Scale: Hurts even more Pain Location: squinting and holding her head at time Pain Descriptors /  Indicators: Grimacing Pain Intervention(s): Monitored during session    Home Living                          Prior Function            PT Goals (current goals can now be found in the care plan section) Progress towards PT goals: Progressing toward goals    Frequency    Min 4X/week      PT Plan Current plan remains appropriate    Co-evaluation              AM-PAC PT "6 Clicks" Mobility   Outcome Measure  Help needed turning from your back to your side while in a flat bed without using bedrails?: A Lot Help needed moving from lying on your back to sitting on the side of a flat bed without using bedrails?: A Lot Help needed moving to and from a bed to a chair (including a wheelchair)?: A Lot Help needed standing up from a chair using your arms (e.g., wheelchair or bedside chair)?: A Lot Help needed to walk in hospital room?: A Lot Help needed climbing 3-5 steps with a railing? : Total 6 Click Score: 11    End of Session Equipment Utilized During Treatment: Gait belt Activity Tolerance: Patient tolerated treatment well Patient left: in bed;with bed alarm set;with call bell/phone within reach   PT Visit Diagnosis: Other abnormalities of gait and mobility (R26.89);Other symptoms and signs involving the nervous system (R29.898);Hemiplegia and hemiparesis Hemiplegia - Right/Left: Right Hemiplegia - dominant/non-dominant: Dominant Hemiplegia - caused by: Cerebral infarction     Time: 1103-1130 PT Time Calculation (min) (ACUTE ONLY): 27 min  Charges:  $Gait Training: 8-22 mins $Therapeutic Activity: 8-22 mins                     Mckenzie Guerrero, PT Acute Rehabilitation Services Pager:[phone] Office:[phone] 06/11/2021    Mckenzie Guerrero 06/11/2021, 1:39 PM

## 2021-06-12 ENCOUNTER — Inpatient Hospital Stay (HOSPITAL_COMMUNITY): Payer: PPO

## 2021-06-12 DIAGNOSIS — E7800 Pure hypercholesterolemia, unspecified: Secondary | ICD-10-CM

## 2021-06-12 DIAGNOSIS — I480 Paroxysmal atrial fibrillation: Secondary | ICD-10-CM

## 2021-06-12 LAB — BASIC METABOLIC PANEL
Anion gap: 6 (ref 5–15)
BUN: 18 mg/dL (ref 8–23)
CO2: 24 mmol/L (ref 22–32)
Calcium: 8.7 mg/dL — ABNORMAL LOW (ref 8.9–10.3)
Chloride: 105 mmol/L (ref 98–111)
Creatinine, Ser: 0.69 mg/dL (ref 0.44–1.00)
GFR, Estimated: >60 mL/min (ref >60)
Glucose, Bld: 114 mg/dL — ABNORMAL HIGH (ref 70–99)
Potassium: 3.6 mmol/L (ref 3.5–5.1)
Sodium: 135 mmol/L (ref 135–145)

## 2021-06-12 LAB — GLUCOSE, CAPILLARY
Glucose-Capillary: 114 mg/dL — ABNORMAL HIGH (ref 70–99)
Glucose-Capillary: 119 mg/dL — ABNORMAL HIGH (ref 70–99)
Glucose-Capillary: 147 mg/dL — ABNORMAL HIGH (ref 70–99)

## 2021-06-12 LAB — CBC
HCT: 35.9 % — ABNORMAL LOW (ref 36.0–46.0)
Hemoglobin: 12 g/dL (ref 12.0–15.0)
MCH: 30.5 pg (ref 26.0–34.0)
MCHC: 33.4 g/dL (ref 30.0–36.0)
MCV: 91.3 fL (ref 80.0–100.0)
Platelets: 404 10*3/uL — ABNORMAL HIGH (ref 150–400)
RBC: 3.93 MIL/uL (ref 3.87–5.11)
RDW: 12.3 % (ref 11.5–15.5)
WBC: 14.8 10*3/uL — ABNORMAL HIGH (ref 4.0–10.5)
nRBC: 0 % (ref 0.0–0.2)

## 2021-06-12 MED ORDER — CHLORHEXIDINE GLUCONATE CLOTH 2 % EX PADS
6.0000 | MEDICATED_PAD | Freq: Every day | CUTANEOUS | Status: DC
  Administered 2021-06-12 – 2021-06-13 (×2): 6 via TOPICAL

## 2021-06-12 NOTE — H&P (Signed)
Physical Medicine and Rehabilitation Admission H&P    Chief Complaint  Patient presents with   Functional deficits due to stroke.     HPI: Mckenzie Guerrero. Schuelke is a 70  year old female with history of HTN, migraines, anxiety, PUD, tremors, large HH repair 05/25/21  who was admitted on 06/08/2021 after found in recline with right hemiparesis, right facial weakness with difficulty speaking and left gaze preference.  History taken from chart review and family due to cognition.  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 She was out of window for tPA and not thrombectomy candidate due to completed stroke.  MRI brain showed evolving left MCA infarct with hemorrhagic transformation and trace rightward midline shift. She was kept NPO till swallow eval and has been advanced to D1 thin liquids. Follow up CT head 12/02 showed large left MCA infarct with hemorrhagic transformation and 3 mm shift to the right. A fib was captured by [facility] and she was started on low dose beta-blocker. Dr. Erlinda Hong recommends anticoagulation 10-15 days post stroke due to hemorrhage and if neurologically stable. Dr. Erlinda Hong felt that stroke was due to newly diagnosed atrial fibrillation. She continues on low dose ASA with repeat CT head on 12/2 stable. She continues to be limited by right hemiparesis with left gaze preference with right inattention, balance deficits and is able to follow simple one step commands inconsistently. CIR recommended due to functional decline.  Please see preadmission assessment from earlier today as well.   Review of Systems  Unable to perform ROS: Language    Past Medical History:  Diagnosis Date   Anxiety    Arthritis    osteoarthritis   Complication of anesthesia    GERD (gastroesophageal reflux disease)    Headache    migraines   History of bleeding ulcers 2010   History of hiatal hernia     Hypertension    Neuromuscular disorder (HCC)    tremors   Occasional tremors    PONV (postoperative nausea and vomiting)     Past Surgical History:  Procedure Laterality Date   ANTERIOR CERVICAL DECOMP/DISCECTOMY FUSION N/A 09/28/2019   Procedure: Anterior Cervical Discectomy Fusion - Cervical Four-Cervical Five- Cervical Five-Cervical Six;  Surgeon: Earnie Larsson, MD;  Location: [facility];  Service: Neurosurgery;  Laterality: N/A;  Anterior Cervical Discectomy Fusion - Cervical Four-Cervical Five- Cervical Five-Cervical Six   benign tumor removal from left breast 2003  2003   BUNIONECTOMY Left    Great toe   COLONOSCOPY     ESOPHAGOGASTRODUODENOSCOPY N/A 05/25/2021   Procedure: ESOPHAGOGASTRODUODENOSCOPY (EGD);  Surgeon: Johnathan Hausen, MD;  Location: [facility];  Service: General;  Laterality: N/A;   XI ROBOTIC ASSISTED HIATAL HERNIA REPAIR N/A 05/25/2021   Procedure: XI ROBOTIC ASSISTED HIATAL HERNIA REPAIR WITH FUNDOPLICATION;  Surgeon: Johnathan Hausen, MD;  Location: [facility];  Service: General;  Laterality: N/A;    Family History: Unable to elicit due to aphasia.    Social History:  Was living alone and independent TPA. Per  reports that she has quit smoking. She has never used smokeless tobacco. She reports that she does not drink alcohol and does not use drugs.   Allergies  Allergen Reactions   Aspirin    Codeine Hives and Itching   Doxycycline Nausea And Vomiting   Nsaids Other (See Comments)    Gi bleeding   Sulfonamide Derivatives Swelling  Prednisone Anxiety    Medications Prior to Admission  Medication Sig Dispense Refill   ramipril (ALTACE) 5 MG capsule Take 5 mg by mouth 2 (two) times daily.     ASCORBIC ACID PO Take 1 mg by mouth daily. Gummy     cholecalciferol (VITAMIN D3) 25 MCG (1000 UNIT) tablet Take 1,000 Units by mouth daily.     diclofenac Sodium (VOLTAREN) 1 % GEL Apply 2 g topically daily as needed (pain on Knees and hands).     HYDROcodone-acetaminophen  (NORCO/VICODIN) 5-325 MG tablet Take 1 tablet by mouth every 6 (six) hours as needed for moderate pain. 15 tablet 0   loratadine (CLARITIN) 10 MG tablet Take 10 mg by mouth daily.     Multiple Vitamin (MULTIVITAMIN WITH MINERALS) TABS tablet Take 1 tablet by mouth daily. Centrum Silver Woman     ondansetron (ZOFRAN ODT) 4 MG disintegrating tablet Take 1 tablet (4 mg total) by mouth every 8 (eight) hours as needed for nausea or vomiting. 20 tablet 0   Phenylephrine-Acetaminophen (SINUS PRESSURE + PAIN) 5-325 MG TABS Take 1-2 tablets by mouth daily as needed (congestion).     Polyethyl Glycol-Propyl Glycol (SYSTANE OP) Place 1 drop into both eyes daily as needed (dry eyes).     Probiotic CAPS Take 1 capsule by mouth daily. renew life probiotics     sodium chloride (OCEAN) 0.65 % SOLN nasal spray Place 1 spray into both nostrils daily as needed for congestion (allergies). Simple saline     SUMAtriptan (IMITREX) 50 MG tablet Take 50 mg by mouth every 2 (two) hours as needed for migraine.      [DISCONTINUED] acetaminophen (TYLENOL) 500 MG tablet Take 1,000 mg by mouth 2 (two) times daily. Rapid release      Drug Regimen Review  Drug regimen was reviewed and remains appropriate with no significant issues identified  Home: Home Living Family/patient expects to be discharged to:: Private residence Living Arrangements: Alone Available Help at Discharge: Family Type of Home: House Home Access: Stairs to enter Technical brewer of Steps: 1 Entrance Stairs-Rails: None Home Layout: Two level Alternate Level Stairs-Number of Steps: flight Alternate Level Stairs-Rails: Right Bathroom Shower/Tub: Multimedia programmer: Standard Home Equipment: None   Functional History: Prior Function Prior Level of Function : Independent/Modified Independent  Functional Status:  Mobility: Bed Mobility Overal bed mobility: Needs Assistance Bed Mobility: Sit to Supine Supine to sit: Mod assist,  HOB elevated Sit to supine: Mod assist General bed mobility comments: required assistance with BLEs Transfers Overall transfer level: Needs assistance Equipment used: 1 person hand held assist Transfers: Sit to/from Stand Sit to Stand: Mod assist Bed to/from chair/wheelchair/BSC transfer type:: Step pivot Step pivot transfers: Mod assist General transfer comment: for safety +2 for equipment with mobilty and transfers Ambulation/Gait Ambulation/Gait assistance: Mod assist, +2 safety/equipment Gait Distance (Feet): 120 Feet Assistive device: 1 person hand held assist, 2 person hand held assist Gait Pattern/deviations: Step-to pattern, Step-through pattern, Decreased stride length, Ataxic, Drifts right/left General Gait Details: hand hold on L and cues for forward gaze, son helped with telemetry box and spouse walked with pt as well at times holding R elbow    ADL: ADL Overall ADL's : Needs assistance/impaired Eating/Feeding: NPO Grooming: Wash/dry hands, Wash/dry face, Minimal assistance, Sitting, Cueing for sequencing Grooming Details (indicate cue type and reason): Patient stood at sink for grooming tasks. Upper Body Bathing: Sitting, Moderate assistance Lower Body Bathing: Maximal assistance, +2 for safety/equipment, Sit to/from stand Upper Body  Dressing : Moderate assistance, Sitting Upper Body Dressing Details (indicate cue type and reason): doffed old gown and donned new gown Lower Body Dressing: Maximal assistance, +2 for safety/equipment, Sit to/from stand Toilet Transfer: Moderate assistance, +2 for safety/equipment, Regular Toilet, Ambulation, Grab bars Toilet Transfer Details (indicate cue type and reason): required assistance of 2 due to equipment Toileting- Clothing Manipulation and Hygiene: Maximal assistance, Sit to/from stand Toileting - Clothing Manipulation Details (indicate cue type and reason): patient used rail for balance during toilet hygiene Functional mobility  during ADLs: Moderate assistance, +2 for safety/equipment General ADL Comments: Patient required increased time and often repeated cues to follow commands  Cognition: Cognition Overall Cognitive Status: Impaired/Different from baseline Arousal/Alertness: Lethargic Orientation Level: Oriented to person Attention: Focused Focused Attention: Impaired Focused Attention Impairment: Verbal basic, Functional basic Awareness: Impaired Awareness Impairment: Intellectual impairment Problem Solving: Impaired Problem Solving Impairment: Verbal basic, Functional basic Safety/Judgment: Impaired Cognition Arousal/Alertness: Awake/alert Behavior During Therapy: Flat affect, Impulsive Overall Cognitive Status: Impaired/Different from baseline Area of Impairment: Attention, Following commands, Safety/judgement Current Attention Level: Focused Following Commands: Follows one step commands inconsistently Safety/Judgement: Decreased awareness of safety, Decreased awareness of deficits General Comments: increased time to follow commands Difficult to assess due to: Impaired communication   Blood pressure 100/68, pulse 69, temperature 97.9 F (36.6 C), temperature source Oral, resp. rate 16, height 5\' 5"  (1.651 m), weight 61.4 kg, SpO2 98 %. Physical Exam Vitals and nursing note reviewed.  Constitutional:      General: She is not in acute distress.    Appearance: Normal appearance. She is not ill-appearing.     Comments: Fatigued appearing.  HENT:     Head: Normocephalic and atraumatic.     Right Ear: External ear normal.     Left Ear: External ear normal.     Nose: Nose normal.  Eyes:     General:        Right eye: No discharge.        Left eye: No discharge.     Extraocular Movements: Extraocular movements intact.  Cardiovascular:     Comments: Irregularly irregular Pulmonary:     Effort: Pulmonary effort is normal. No respiratory distress.     Breath sounds: No stridor.  Abdominal:      General: Abdomen is flat. Bowel sounds are normal.  Genitourinary:    Comments: Foley in place Musculoskeletal:     Cervical back: Normal range of motion and neck supple.     Comments: No edema or tenderness in extremities  Skin:    General: Skin is warm and dry.  Neurological:     Mental Status: She is alert.     Comments: Global aphasia Alert Nods yes to all questions.  Unable to point or follow motor commands without tactile cues.  Right facial weakness.  Motor: Spontaneously moves all extremities, >/3/5 throughout  Psychiatric:     Comments: Unable to assess due to mentation    Results for orders placed or performed during the hospital encounter of 06/08/21 (from the past 48 hour(s))  Glucose, capillary     Status: Abnormal   Collection Time: 06/11/21  2:25 PM  Result Value Ref Range   Glucose-Capillary 131 (H) 70 - 99 mg/dL    Comment: Glucose reference range applies only to samples taken after fasting for at least 8 hours.  Glucose, capillary     Status: Abnormal   Collection Time: 06/11/21  6:13 PM  Result Value Ref Range   Glucose-Capillary 126 (H)  70 - 99 mg/dL    Comment: Glucose reference range applies only to samples taken after fasting for at least 8 hours.   Comment 1 Notify RN    Comment 2 Document in Chart   Glucose, capillary     Status: Abnormal   Collection Time: 06/12/21 12:37 AM  Result Value Ref Range   Glucose-Capillary 119 (H) 70 - 99 mg/dL    Comment: Glucose reference range applies only to samples taken after fasting for at least 8 hours.  Basic metabolic panel     Status: Abnormal   Collection Time: 06/12/21  3:50 AM  Result Value Ref Range   Sodium 135 135 - 145 mmol/L   Potassium 3.6 3.5 - 5.1 mmol/L   Chloride 105 98 - 111 mmol/L   CO2 24 22 - 32 mmol/L   Glucose, Bld 114 (H) 70 - 99 mg/dL    Comment: Glucose reference range applies only to samples taken after fasting for at least 8 hours.   BUN 18 8 - 23 mg/dL   Creatinine, Ser 0.69 0.44  - 1.00 mg/dL   Calcium 8.7 (L) 8.9 - 10.3 mg/dL   GFR, Estimated >60 >60 mL/min    Comment: (NOTE) Calculated using the CKD-EPI Creatinine Equation (2021)    Anion gap 6 5 - 15    Comment: Performed at [facility] [address], [place], [place] [address]  CBC     Status: Abnormal   Collection Time: 06/12/21  3:50 AM  Result Value Ref Range   WBC 14.8 (H) 4.0 - 10.5 K/uL   RBC 3.93 3.87 - 5.11 MIL/uL   Hemoglobin 12.0 12.0 - 15.0 g/dL   HCT 35.9 (L) 36.0 - 46.0 %   MCV 91.3 80.0 - 100.0 fL   MCH 30.5 26.0 - 34.0 pg   MCHC 33.4 30.0 - 36.0 g/dL   RDW 12.3 11.5 - 15.5 %   Platelets 404 (H) 150 - 400 K/uL   nRBC 0.0 0.0 - 0.2 %    Comment: Performed at [facility], [place] [address] [place], [place] [address]  Glucose, capillary     Status: Abnormal   Collection Time: 06/12/21  6:04 AM  Result Value Ref Range   Glucose-Capillary 114 (H) 70 - 99 mg/dL    Comment: Glucose reference range applies only to samples taken after fasting for at least 8 hours.  Glucose, capillary     Status: Abnormal   Collection Time: 06/12/21 12:08 PM  Result Value Ref Range   Glucose-Capillary 147 (H) 70 - 99 mg/dL    Comment: Glucose reference range applies only to samples taken after fasting for at least 8 hours.   Comment 1 Notify RN    Comment 2 Document in Chart   Glucose, capillary     Status: Abnormal   Collection Time: 06/13/21 12:48 AM  Result Value Ref Range   Glucose-Capillary 112 (H) 70 - 99 mg/dL    Comment: Glucose reference range applies only to samples taken after fasting for at least 8 hours.  Glucose, capillary     Status: None   Collection Time: 06/13/21  6:26 AM  Result Value Ref Range   Glucose-Capillary 97 70 - 99 mg/dL    Comment: Glucose reference range applies only to samples taken after fasting for at least 8 hours.  Glucose, capillary     Status: Abnormal   Collection Time: 06/13/21 12:39 PM  Result Value Ref Range   Glucose-Capillary  117 (H) 70 - 99 mg/dL     Comment: Glucose reference range applies only to samples taken after fasting for at least 8 hours.   CT HEAD WO CONTRAST (5MM)  Result Date: 06/12/2021 CLINICAL DATA:  Stroke follow-up EXAM: CT HEAD WITHOUT CONTRAST TECHNIQUE: Contiguous axial images were obtained from the base of the skull through the vertex without intravenous contrast. COMPARISON:  CT head 06/10/2021.  MRI 06/09/2021 FINDINGS: Brain: Large territory acute infarct left MCA territory with hemorrhagic transformation. No significant change in infarct distribution or hemorrhage since the prior CT. Mass-effect and 3 mm midline shift to the right unchanged Ventricle size normal. Mild atrophy and mild chronic microvascular ischemia. Vascular: Negative for hyperdense vessel Skull: Negative Sinuses/Orbits: Negative Other: None IMPRESSION: CT stable from 2 days prior. Acute large territory left MCA infarct with hemorrhagic transformation. 3 mm midline shift to the right unchanged. Electronically Signed   By: Franchot Gallo M.D.   On: 06/12/2021 12:56       Medical Problem List and Plan: 1. 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  -patient may shower  -ELOS/Goals: 16-19 days/min A  Admit to CIR 2.  Antithrombotics: -DVT/anticoagulation:  Pharmaceutical: Lovenox  -antiplatelet therapy:  ASA daily 3. Pain Management: Tylenol prn.  4. Mood: LCSW to follow for evaluation and support.   -antipsychotic agents: N/A 5. Neuropsych: This patient is not capable of making decisions on her own behalf. 6. Skin/Wound Care: Routine pressure relief measures.  7. Fluids/Electrolytes/Nutrition: Monitor I/Os.  CMP ordered 8. A fib: Will monitor BP TID--on metoprolol bid for rate cotnrol  Monitor with increased activity 9. H/o Migraines: Was on sumatriptan PTA.    Monitor for increase in migraines, prior to killing association with vascular headaches 10.  Leucocytosis: Monitor for fevers and other signs of infection.   CBC ordered 11. Urinary retention? Incontinence?: Foley in place   Will plan for voiding trial  Bary Leriche, PA-C 06/13/2021  I have personally performed a face to face diagnostic evaluation, including, but not limited to relevant history and physical exam findings, of this patient and developed relevant assessment and plan.  Additionally, I have reviewed and concur with the physician assistant's documentation above.  Delice Lesch, MD, ABPMR

## 2021-06-12 NOTE — Progress Notes (Signed)
Physical Therapy Treatment Patient Details Name: Mckenzie Guerrero MRN: [id number] DOB: September 12, 1950 Today's Date: 06/12/2021   History of Present Illness Pt is a 70 y/o female admitted 11/28 secondary to R weakness and decreased responsiveness. CT showed L MCA infarct. PMH includes HTN, migraines, tremors, recent hernia repair and s/p ACDF.    PT Comments    Patient progressing with ambulation able to walk with +1 A with HHA on R.  Still with some R lateral lean at times, but improved and even reaching for things with R UE at times.  Patient remains aphasic with pt's family assisting with meal today.  PT will continue to follow acutely.    Recommendations for follow up therapy are one component of a multi-disciplinary discharge planning process, led by the attending physician.  Recommendations may be updated based on patient status, additional functional criteria and insurance authorization.  Follow Up Recommendations  Acute inpatient rehab (3hours/day)     Assistance Recommended at Discharge Frequent or constant Supervision/Assistance  Equipment Recommendations  Wheelchair (measurements PT);BSC/3in1;Hospital bed    Recommendations for Other Services       Precautions / Restrictions Precautions Precautions: Fall Precaution Comments: Pt with recent R hernia repairl R inattention, aphasic     Mobility  Bed Mobility Overal bed mobility: Needs Assistance Bed Mobility: Supine to Sit     Supine to sit: Mod assist;HOB elevated     General bed mobility comments: to initiate legs off bed and lifting trunk    Transfers Overall transfer level: Needs assistance Equipment used: 1 person hand held assist Transfers: Sit to/from Stand Sit to Stand: Mod assist                Ambulation/Gait Ambulation/Gait assistance: Mod assist;+2 safety/equipment Gait Distance (Feet): 120 Feet Assistive device: 1 person hand held assist;2 person hand held assist Gait Pattern/deviations:  Step-to pattern;Step-through pattern;Decreased stride length;Ataxic;Drifts right/left       General Gait Details: hand hold on L and cues for forward gaze, son helped with telemetry box and spouse walked with pt as well at times holding R elbow   Stairs             Wheelchair Mobility    Modified Rankin (Stroke Patients Only) Modified Rankin (Stroke Patients Only) Pre-Morbid Rankin Score: No symptoms Modified Rankin: Moderately severe disability     Balance Overall balance assessment: Modified Independent Sitting-balance support: Feet supported Sitting balance-Leahy Scale: Poor Sitting balance - Comments: leaning R and spouse sat next to her for safety when PT left for gown                                    Cognition Arousal/Alertness: Awake/alert Behavior During Therapy: Flat affect;Impulsive Overall Cognitive Status: Impaired/Different from baseline Area of Impairment: Attention;Following commands;Safety/judgement                       Following Commands: Follows one step commands inconsistently Safety/Judgement: Decreased awareness of safety;Decreased awareness of deficits              Exercises      General Comments General comments (skin integrity, edema, etc.): patient with smear on bed pad from BM.  Ambulated to sink wtih PT/OT and assist to wash perineal area.  Still with BM so walked to bathroom wtih mod A +2 for equipment and sat on toilet.  Assisted with OT out of bathroom  back to chair in front of sink; HR max 124      Pertinent Vitals/Pain Pain Assessment: Faces Faces Pain Scale: Hurts little more Breathing: normal Negative Vocalization: none Facial Expression: smiling or inexpressive Body Language: relaxed Consolability: no need to console PAINAD Score: 0 Pain Location: reaching back for L hip Pain Descriptors / Indicators: Grimacing;Guarding Pain Intervention(s): Monitored during session;Repositioned    Home  Living                          Prior Function            PT Goals (current goals can now be found in the care plan section) Progress towards PT goals: Progressing toward goals    Frequency    Min 4X/week      PT Plan Current plan remains appropriate    Co-evaluation              AM-PAC PT "6 Clicks" Mobility   Outcome Measure  Help needed turning from your back to your side while in a flat bed without using bedrails?: A Lot Help needed moving from lying on your back to sitting on the side of a flat bed without using bedrails?: A Lot Help needed moving to and from a bed to a chair (including a wheelchair)?: A Lot Help needed standing up from a chair using your arms (e.g., wheelchair or bedside chair)?: A Lot Help needed to walk in hospital room?: A Lot Help needed climbing 3-5 steps with a railing? : Total 6 Click Score: 11    End of Session Equipment Utilized During Treatment: Gait belt Activity Tolerance: Patient limited by fatigue Patient left: in chair;Other (comment) (with OT at sink)   PT Visit Diagnosis: Other abnormalities of gait and mobility (R26.89);Other symptoms and signs involving the nervous system (R29.898);Hemiplegia and hemiparesis Hemiplegia - Right/Left: Right Hemiplegia - dominant/non-dominant: Dominant Hemiplegia - caused by: Cerebral infarction     Time: 1155-1220 PT Time Calculation (min) (ACUTE ONLY): 25 min  Charges:  $Gait Training: 8-22 mins                     Magda Kiel, PT Acute Rehabilitation Services Pager:[phone] Office:[phone] 06/12/2021    Mckenzie Guerrero 06/12/2021, 1:37 PM

## 2021-06-12 NOTE — Progress Notes (Signed)
PROGRESS NOTE  Mckenzie Guerrero:[id number] DOB: 03/31/1951 DOA: 06/08/2021 PCP: Raina Mina., MD   LOS: 4 days   Brief Narrative / Interim history: 70 year old female with history of migraine headaches, bleeding ulcers, HTN, hiatal hernia status post recent laparoscopic Nissen fundoplication on 45/62/5638 comes into the hospital on 11/28 as a code stroke after the family found her in the recliner at home.  Son says that she was alert, her eyes were open, had facial droop and could not move the right side of the body.  Last known well is at 9 AM that morning, and son found her around 5 PM in the afternoon.  She was brought to the ED as a code stroke and work-up revealed a large left MCA territory ischemic infarction without any LVO.  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  She was out of the window for tPA without presentation and not a thrombectomy candidate due to large complex stroke with aspects of 3.  Subjective / 24h Interval events: Remains aphasic but appears alert.  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  An MRI of the brain obtained later on showed evolving left MCA territory infarct with evidence of hemorrhagic transformation trace rightward midline shift.  Neurology consulted.  She failed swallow eval and  currently she is on rectal aspirin.  LDL 139, continue high intensity statin once she passes a swallow eval.  A1c 5.8 suggesting prediabetes.  2D echo showed EF 60-65%, no WMA, grade 1 diastolic dysfunction.  RV is normal.  No intracardiac source of embolus detected.  Lower extremity Doppler ultrasound was negative for DVT. -Repeat CT scan 11/30 identifies again large left MCA territory with multifocal confluent petechial hemorrhages without significant changes, but it did show some increased intracranial mass-effect with a 3 mm rightward midline shift -Appreciate neurology follow-up, repeat a CT scan of the head today -Pending CIR admission  Active Problems Hyperlipidemia-continue high intensity statin once passes swallow eval Prediabetes-A1c 5.8 Paroxysmal atrial fibrillation-on admission she was in sinus rhythm but overnight seems to be in A. fib/flutter.  Rates in the low 100s.  Start metoprolol.  Neurology recommends anticoagulation 10 to 14 days out given petechial hemorrhage in the stroke area Leukocytosis-possibly reactive, she did have leukocytosis on 11/15 around her surgery, which improved.  WBC continues to improve Hiatal hernia-status post robotic repair with fundoplication on 93/73 4287. Thrombocytosis-possibly reactive Hyponatremia-mild, monitor Essential hypertension-blood pressure stable, start metoprolol low-dose as above, should not affect blood pressure too much History of migraines-monitor  Scheduled Meds:  aspirin  81 mg Oral Daily   atorvastatin  40 mg Oral Daily   Chlorhexidine Gluconate Cloth  6 each Topical Daily   heparin injection (subcutaneous)  5,000 Units Subcutaneous Q8H   metoprolol tartrate  12.5 mg Oral BID   Continuous Infusions:   PRN Meds:.acetaminophen **OR** acetaminophen (TYLENOL) oral liquid 160 mg/5 mL **OR**  acetaminophen, labetalol  Diet Orders (From admission, onward)     Start     Ordered   06/11/21 1442  DIET - DYS 1 Room service  appropriate? Yes; Fluid consistency: Thin  Diet effective now       Question Answer Comment  Room service appropriate? Yes   Fluid consistency: Thin      06/11/21 1441            DVT prophylaxis: heparin injection 5,000 Units Start: 06/10/21 2200 SCD's Start: 06/08/21 2222     Code Status: Full Code  Family Communication: No family at bedside  Status is: Inpatient  Remains inpatient appropriate because: Persistent symptoms  Level of care: Progressive  Consultants:  Neurology   Procedures:  2d echo  Microbiology  none  Antimicrobials: none    Objective: Vitals:   06/12/21 0339 06/12/21 0500 06/12/21 0600 06/12/21 0745  BP: 110/70   110/79  Pulse: 83 67 74 74  Resp: 17 13 19 18   Temp: 98 F (36.7 C)   98.4 F (36.9 C)  TempSrc: Oral   Oral  SpO2: 97% 100% 98% 98%  Weight:      Height:        Intake/Output Summary (Last 24 hours) at 06/12/2021 7001 Last data filed at 06/11/2021 2358 Gross per 24 hour  Intake 120 ml  Output 700 ml  Net -580 ml    Filed Weights   06/08/21 1842 06/08/21 1922  Weight: 61.4 kg 61.4 kg    Examination:  Constitutional: NAD Eyes: Anicteric ENMT: Moist mucous membranes Neck: normal, supple Respiratory: Clear to auscultation bilaterally, no wheezing, no crackles Cardiovascular: Regular rate and rhythm, no murmurs, no edema Abdomen: Soft, NT, ND, bowel sounds positive Musculoskeletal: no clubbing / cyanosis.  Skin: No rashes appreciated Neurologic: Equal strength in her legs, flaccid right arm, no new deficits  Data Reviewed: I have independently reviewed following labs and imaging studies   CBC: Recent Labs  Lab 06/08/21 1839 06/08/21 1844 06/09/21 0433 06/11/21 0120 06/12/21 0350  WBC 15.4*  --  21.9* 17.6* 14.8*  NEUTROABS 12.5*  --   --   --   --   HGB 13.0 13.3 14.6 12.9 12.0  HCT 39.9 39.0 44.7 39.4 35.9*  MCV 93.0  --  91.6 91.6 91.3  PLT 442*  --  515* 418* 404*    Basic Metabolic  Panel: Recent Labs  Lab 06/08/21 1839 06/08/21 1844 06/11/21 0120 06/12/21 0350  NA 136 138 134* 135  K 3.5 3.6 3.5 3.6  CL 105 107 104 105  CO2 23  --  23 24  GLUCOSE 125* 121* 120* 114*  BUN 10 10 16 18   CREATININE 0.61 0.50 0.61 0.69  CALCIUM 8.7*  --  8.7* 8.7*  MG  --   --  2.0  --     Liver Function Tests: Recent Labs  Lab 06/08/21 1839 06/11/21 0120  AST 21 19  ALT 20 14  ALKPHOS 77 77  BILITOT 0.5 0.7  PROT 6.3* 6.1*  ALBUMIN 3.5 3.1*    Coagulation Profile: Recent Labs  Lab 06/08/21 1839  INR 1.1    HbA1C: No results for input(s): HGBA1C in the last 72 hours.  CBG: Recent Labs  Lab 06/11/21 0647 06/11/21 1425 06/11/21 1813 06/12/21 0037 06/12/21 0604  GLUCAP 124* 131* 126* 119* 114*     Recent Results (from the past 240 hour(s))  Resp Panel by RT-PCR (Flu A&B, Covid) Urine, Clean Catch  Status: None   Collection Time: 06/08/21 10:45 PM   Specimen: Urine, Clean Catch; Nasopharyngeal(NP) swabs in vial transport medium  Result Value Ref Range Status   SARS Coronavirus 2 by RT PCR NEGATIVE NEGATIVE Final    Comment: (NOTE) SARS-CoV-2 target nucleic acids are NOT DETECTED.  The SARS-CoV-2 RNA is generally detectable in upper respiratory specimens during the acute phase of infection. The lowest concentration of SARS-CoV-2 viral copies this assay can detect is 138 copies/mL. A negative result does not preclude SARS-Cov-2 infection and should not be used as the sole basis for treatment or other patient management decisions. A negative result may occur with  improper specimen collection/handling, submission of specimen other than nasopharyngeal swab, presence of viral mutation(s) within the areas targeted by this assay, and inadequate number of viral copies(<138 copies/mL). A negative result must be combined with clinical observations, patient history, and epidemiological information. The expected result is Negative.  Fact Sheet for  Patients:  EntrepreneurPulse.com.au  Fact Sheet for Healthcare Providers:  IncredibleEmployment.be  This test is no t yet approved or cleared by the [place] FDA and  has been authorized for detection and/or diagnosis of SARS-CoV-2 by FDA under an Emergency Use Authorization (EUA). This EUA will remain  in effect (meaning this test can be used) for the duration of the COVID-19 declaration under Section 564(b)(1) of the Act, 21 U.S.C.section 360bbb-3(b)(1), unless the authorization is terminated  or revoked sooner.       Influenza A by PCR NEGATIVE NEGATIVE Final   Influenza B by PCR NEGATIVE NEGATIVE Final    Comment: (NOTE) The Xpert Xpress SARS-CoV-2/FLU/RSV plus assay is intended as an aid in the diagnosis of influenza from Nasopharyngeal swab specimens and should not be used as a sole basis for treatment. Nasal washings and aspirates are unacceptable for Xpert Xpress SARS-CoV-2/FLU/RSV testing.  Fact Sheet for Patients: EntrepreneurPulse.com.au  Fact Sheet for Healthcare Providers: IncredibleEmployment.be  This test is not yet approved or cleared by the [place] FDA and has been authorized for detection and/or diagnosis of SARS-CoV-2 by FDA under an Emergency Use Authorization (EUA). This EUA will remain in effect (meaning this test can be used) for the duration of the COVID-19 declaration under Section 564(b)(1) of the Act, 21 U.S.C. section 360bbb-3(b)(1), unless the authorization is terminated or revoked.  Performed at [facility], [place] [address], [place], [place] [address]       Radiology Studies: DG Swallowing Func-Speech Pathology  Result Date: 06/11/2021 Table formatting from the original result was not included. Objective Swallowing Evaluation: Type of Study: MBS-Modified Barium Swallow Study  Patient Details Name: Mckenzie Guerrero MRN: [id number] Date of Birth: [birth year]  Today's Date: 06/11/2021 Time: SLP Start Time (ACUTE ONLY): 1200 -SLP Stop Time (ACUTE ONLY): 7341 SLP Time Calculation (min) (ACUTE ONLY): 20 min Past Medical History: Past Medical History: Diagnosis Date  Anxiety   Arthritis   osteoarthritis  Complication of anesthesia   GERD (gastroesophageal reflux disease)   Headache   migraines  History of bleeding ulcers 2010  History of hiatal hernia   Hypertension   Neuromuscular disorder (HCC)   tremors  Occasional tremors   PONV (postoperative nausea and vomiting)  Past Surgical History: Past Surgical History: Procedure Laterality Date  ANTERIOR CERVICAL DECOMP/DISCECTOMY FUSION N/A 09/28/2019  Procedure: Anterior Cervical Discectomy Fusion - Cervical Four-Cervical Five- Cervical Five-Cervical Six;  Surgeon: Earnie Larsson, MD;  Location: [facility];  Service: Neurosurgery;  Laterality: N/A;  Anterior Cervical Discectomy Fusion - Cervical  Four-Cervical Five- Cervical Five-Cervical Six  benign tumor removal from left breast 2003  2003  BUNIONECTOMY Left   Great toe  COLONOSCOPY    ESOPHAGOGASTRODUODENOSCOPY N/A 05/25/2021  Procedure: ESOPHAGOGASTRODUODENOSCOPY (EGD);  Surgeon: Johnathan Hausen, MD;  Location: [facility];  Service: General;  Laterality: N/A;  XI ROBOTIC ASSISTED HIATAL HERNIA REPAIR N/A 05/25/2021  Procedure: XI ROBOTIC ASSISTED HIATAL HERNIA REPAIR WITH FUNDOPLICATION;  Surgeon: Johnathan Hausen, MD;  Location: [facility];  Service: General;  Laterality: N/A; HPI: AMAIRANI SHUEY is a 70 y.o. female presenting after after family found her in her recliner at home with aphasia, right hemiplegia, right facial droop, and left gaze preference. CT head revealed acute to early subacute left MCA territory infarct without evidence of hemorrhagic transformation. Pt with medical history significant of migraine headaches, bleeding ulcers, hypertension, tremors, hiatal hernia and GERD status post recent laparoscopic Nissen fundoplication on 81/82/9937.  No data recorded  Recommendations  for follow up therapy are one component of a multi-disciplinary discharge planning process, led by the attending physician.  Recommendations may be updated based on patient status, additional functional criteria and insurance authorization. Assessment / Plan / Recommendation Clinical Impressions 06/11/2021 Clinical Impression Pt demosntrates a severe oral dysphagia due primarily to cognitive impairment. Pt is very poorly aware of efforts to feed her and only minimally responds to hand over hand assisted feeding. It is very difficult to introduce liquids orally as pt does not fully open mouth to accept them and blocks entrance with tongue. If bolus can be inserted (via cup, spoon or syringe) pt does initiate lingual pumping and will eventually trigger a normal pharyngeal swallow once she gets some sensory feedback from bolus on base of tongue. Recommend pt initiate a puree/thin diet though adequate oral intake will be limited and effortful. Recommend a palliative consult for this pt and family to discuss plan of care given expectation of poor nutritional intake SLP Visit Diagnosis Dysphagia, oral phase (R13.11) Attention and concentration deficit following -- Frontal lobe and executive function deficit following -- Impact on safety and function Risk for inadequate nutrition/hydration   Treatment Recommendations 06/11/2021 Treatment Recommendations Therapy as outlined in treatment plan below   No flowsheet data found. Diet Recommendations 06/11/2021 SLP Diet Recommendations Dysphagia 1 (Puree) solids;Thin liquid Liquid Administration via Cup;Other (Comment);Spoon Medication Administration Crushed with puree Compensations -- Postural Changes Seated upright at 90 degrees   Other Recommendations 06/11/2021 Recommended Consults -- Oral Care Recommendations Oral care BID Other Recommendations Have oral suction available Follow Up Recommendations Acute inpatient rehab (3hours/day) Assistance recommended at discharge Frequent or  constant Supervision/Assistance Functional Status Assessment Patient has had a recent decline in their functional status and demonstrates the ability to make significant improvements in function in a reasonable and predictable amount of time. Frequency and Duration  06/10/2021 Speech Therapy Frequency (ACUTE ONLY) min 2x/week Treatment Duration --   Oral Phase 06/11/2021 Oral Phase Impaired Oral - Pudding Teaspoon -- Oral - Pudding Cup -- Oral - Honey Teaspoon -- Oral - Honey Cup -- Oral - Nectar Teaspoon -- Oral - Nectar Cup -- Oral - Nectar Straw -- Oral - Thin Teaspoon -- Oral - Thin Cup Left anterior bolus loss;Right anterior bolus loss;Lingual pumping;Holding of bolus;Left pocketing in lateral sulci;Right pocketing in lateral sulci;Lingual/palatal residue;Delayed oral transit;Decreased bolus cohesion Oral - Thin Straw -- Oral - Puree Left anterior bolus loss;Right anterior bolus loss;Lingual pumping;Holding of bolus;Left pocketing in lateral sulci;Right pocketing in lateral sulci;Lingual/palatal residue;Delayed oral transit;Decreased bolus cohesion Oral - Mech Soft --  Oral - Regular -- Oral - Multi-Consistency -- Oral - Pill -- Oral Phase - Comment --  Pharyngeal Phase 06/11/2021 Pharyngeal Phase WFL Pharyngeal- Pudding Teaspoon -- Pharyngeal -- Pharyngeal- Pudding Cup -- Pharyngeal -- Pharyngeal- Honey Teaspoon -- Pharyngeal -- Pharyngeal- Honey Cup -- Pharyngeal -- Pharyngeal- Nectar Teaspoon -- Pharyngeal -- Pharyngeal- Nectar Cup -- Pharyngeal -- Pharyngeal- Nectar Straw -- Pharyngeal -- Pharyngeal- Thin Teaspoon -- Pharyngeal -- Pharyngeal- Thin Cup -- Pharyngeal -- Pharyngeal- Thin Straw -- Pharyngeal -- Pharyngeal- Puree -- Pharyngeal -- Pharyngeal- Mechanical Soft -- Pharyngeal -- Pharyngeal- Regular -- Pharyngeal -- Pharyngeal- Multi-consistency -- Pharyngeal -- Pharyngeal- Pill -- Pharyngeal -- Pharyngeal Comment --  No flowsheet data found. DeBlois, Katherene Ponto 06/11/2021, 2:41 PM                       Marzetta Board, MD, PhD [facility]  Between 7 am - 7 pm I am available, please contact me via Amion (for emergencies) or Securechat (non urgent messages)  Between 7 pm - 7 am I am not available, please contact night coverage MD/APP via Amion

## 2021-06-12 NOTE — Progress Notes (Signed)
Inpatient Rehab Admissions Coordinator:   I have a bed for this pt to admit to CIR on Saturday (12/3).  Dr. Cruzita Lederer in agreement.  Rehab MD (Dr. Letta Pate) to assess pt and confirm admission on Saturday.  Floor RN can call CIR at [phone] for report after 12pm on Saturday.  I have let case manager know.    Shann Medal, PT, DPT Admissions Coordinator [phone] 06/12/21  2:58 PM

## 2021-06-12 NOTE — Progress Notes (Signed)
Occupational Therapy Treatment Patient Details Name: NICHOLAS OSSA MRN: [id number] DOB: 04/02/1951 Today's Date: 06/12/2021   History of present illness Pt is a 70 y/o female admitted 11/28 secondary to R weakness and decreased responsiveness. CT showed L MCA infarct. PMH includes HTN, migraines, tremors, recent hernia repair and s/p ACDF.   OT comments  Assisted PT with toilet transfers and hygiene.  Patient stood at sink for cleaning and needed to transfer to toilet. Patient instructed on safety and rail use for toilet transfers and was max assist for hygiene.  Gown change performed from toilet. Grooming performed standing at sink with Patient requiring seated rest breaks due to increased HR. Therapist attempted to have patient transfer to recliner but patient walked to bed and indicated she would like to rest. Acute OT to continue to follow.    Recommendations for follow up therapy are one component of a multi-disciplinary discharge planning process, led by the attending physician.  Recommendations may be updated based on patient status, additional functional criteria and insurance authorization.    Follow Up Recommendations  Acute inpatient rehab (3hours/day)    Assistance Recommended at Discharge Frequent or constant Supervision/Assistance  Equipment Recommendations  Wheelchair (measurements OT);Wheelchair cushion (measurements OT);BSC/3in1    Recommendations for Other Services      Precautions / Restrictions Precautions Precautions: Fall Precaution Comments: Pt with recent R hernia repairl R inattention, aphasic Restrictions Weight Bearing Restrictions: No       Mobility Bed Mobility Overal bed mobility: Needs Assistance Bed Mobility: Sit to Supine     Supine to sit: Mod assist;HOB elevated Sit to supine: Mod assist   General bed mobility comments: required assistance with BLEs    Transfers Overall transfer level: Needs assistance Equipment used: 1 person hand held  assist Transfers: Sit to/from Stand Sit to Stand: Mod assist   Step pivot transfers: Mod assist       General transfer comment: for safety +2 for equipment with mobilty and transfers     Balance Overall balance assessment: Needs assistance Sitting-balance support: Feet supported Sitting balance-Leahy Scale: Poor Sitting balance - Comments: right lateral lean when on EOB   Standing balance support: Bilateral upper extremity supported Standing balance-Leahy Scale: Poor Standing balance comment: used rail in bathroom to assist with balance and used sink                           ADL either performed or assessed with clinical judgement   ADL Overall ADL's : Needs assistance/impaired     Grooming: Wash/dry hands;Wash/dry face;Minimal assistance;Sitting;Cueing for sequencing Grooming Details (indicate cue type and reason): Patient stood at sink for grooming tasks.         Upper Body Dressing : Moderate assistance;Sitting Upper Body Dressing Details (indicate cue type and reason): doffed old gown and donned new gown     Toilet Transfer: Moderate assistance;+2 for safety/equipment;Regular Toilet;Ambulation;Grab bars Toilet Transfer Details (indicate cue type and reason): required assistance of 2 due to equipment Toileting- Clothing Manipulation and Hygiene: Maximal assistance;Sit to/from stand Toileting - Clothing Manipulation Details (indicate cue type and reason): patient used rail for balance during toilet hygiene     Functional mobility during ADLs: Moderate assistance;+2 for safety/equipment General ADL Comments: Patient required increased time and often repeated cues to follow commands    Extremity/Trunk Assessment              Vision       Perception  Praxis      Cognition Arousal/Alertness: Awake/alert Behavior During Therapy: Flat affect;Impulsive Overall Cognitive Status: Impaired/Different from baseline Area of Impairment:  Attention;Following commands;Safety/judgement                   Current Attention Level: Focused   Following Commands: Follows one step commands inconsistently Safety/Judgement: Decreased awareness of safety;Decreased awareness of deficits     General Comments: increased time to follow commands          Exercises     Shoulder Instructions       General Comments HR increased to 120s towards end of session when standing    Pertinent Vitals/ Pain       Pain Assessment: Faces Faces Pain Scale: No hurt Breathing: normal Negative Vocalization: none Facial Expression: smiling or inexpressive Body Language: relaxed Consolability: no need to console PAINAD Score: 0 Pain Location: reaching back for L hip Pain Descriptors / Indicators: Grimacing;Guarding Pain Intervention(s): Monitored during session;Repositioned  Home Living                                          Prior Functioning/Environment              Frequency  Min 2X/week        Progress Toward Goals  OT Goals(current goals can now be found in the care plan section)  Progress towards OT goals: Progressing toward goals  Acute Rehab OT Goals OT Goal Formulation: Patient unable to participate in goal setting Time For Goal Achievement: 06/24/21 Potential to Achieve Goals: Fair ADL Goals Pt Will Perform Grooming: with set-up;sitting Pt Will Perform Upper Body Dressing: sitting;with supervision Pt Will Perform Lower Body Dressing: with min guard assist;sit to/from stand Pt Will Transfer to Toilet: with min guard assist;ambulating Additional ADL Goal #1: Pt will obtain grooming items located on her R side with min cues  Plan Discharge plan remains appropriate    Co-evaluation                 AM-PAC OT "6 Clicks" Daily Activity     Outcome Measure   Help from another person eating meals?: Total Help from another person taking care of personal grooming?: A Lot Help from  another person toileting, which includes using toliet, bedpan, or urinal?: A Lot Help from another person bathing (including washing, rinsing, drying)?: A Lot Help from another person to put on and taking off regular upper body clothing?: A Lot Help from another person to put on and taking off regular lower body clothing?: A Lot 6 Click Score: 11    End of Session Equipment Utilized During Treatment: Gait belt  OT Visit Diagnosis: Unsteadiness on feet (R26.81);Other abnormalities of gait and mobility (R26.89);Muscle weakness (generalized) (M62.81);History of falling (Z91.81);Hemiplegia and hemiparesis;Pain Hemiplegia - Right/Left: Right Hemiplegia - dominant/non-dominant: Dominant Hemiplegia - caused by: Cerebral infarction   Activity Tolerance Patient tolerated treatment well   Patient Left in bed;with call bell/phone within reach;with bed alarm set;with family/visitor present   Nurse Communication Mobility status        Time: 6389-3734 OT Time Calculation (min): 21 min  Charges: OT General Charges $OT Visit: 1 Visit OT Treatments $Self Care/Home Management : 8-22 mins  Lodema Hong, [place]  Pager [phone] Office [place] 06/12/2021, 2:30 PM

## 2021-06-12 NOTE — Progress Notes (Addendum)
STROKE TEAM PROGRESS NOTE   INTERVAL HISTORY Son is at the bedside.  Patient lying in bed, eyes open, awake alert.  Able to cross midline with eyes. Was able to mimic and raise arms off bed. Still globally aphasic. Awaiting inpatient rehab placement. Assessment has improved since initial stroke, but no change vs yesterday.   Vitals:   06/12/21 0500 06/12/21 0600 06/12/21 0745 06/12/21 1039  BP:   110/79 114/76  Pulse: 67 74 74 78  Resp: 13 19 18    Temp:   98.4 F (36.9 C)   TempSrc:   Oral   SpO2: 100% 98% 98%   Weight:      Height:       CBC:  Recent Labs  Lab 06/08/21 1839 06/08/21 1844 06/11/21 0120 06/12/21 0350  WBC 15.4*   < > 17.6* 14.8*  NEUTROABS 12.5*  --   --   --   HGB 13.0   < > 12.9 12.0  HCT 39.9   < > 39.4 35.9*  MCV 93.0   < > 91.6 91.3  PLT 442*   < > 418* 404*   < > = values in this interval not displayed.   Basic Metabolic Panel:  Recent Labs  Lab 06/11/21 0120 06/12/21 0350  NA 134* 135  K 3.5 3.6  CL 104 105  CO2 23 24  GLUCOSE 120* 114*  BUN 16 18  CREATININE 0.61 0.69  CALCIUM 8.7* 8.7*  MG 2.0  --      Lipid Panel:  Recent Labs  Lab 06/09/21 0433  CHOL 200  TRIG 75  HDL 46  CHOLHDL 4.3  VLDL 15  LDLCALC 139*    HgbA1c:  Recent Labs  Lab 06/09/21 0433  HGBA1C 5.8*    IMAGING past 24 hours CT HEAD WO CONTRAST (5MM)  Result Date: 06/12/2021 CLINICAL DATA:  Stroke follow-up EXAM: CT HEAD WITHOUT CONTRAST TECHNIQUE: Contiguous axial images were obtained from the base of the skull through the vertex without intravenous contrast. COMPARISON:  CT head 06/10/2021.  MRI 06/09/2021 FINDINGS: Brain: Large territory acute infarct left MCA territory with hemorrhagic transformation. No significant change in infarct distribution or hemorrhage since the prior CT. Mass-effect and 3 mm midline shift to the right unchanged Ventricle size normal. Mild atrophy and mild chronic microvascular ischemia. Vascular: Negative for hyperdense vessel  Skull: Negative Sinuses/Orbits: Negative Other: None IMPRESSION: CT stable from 2 days prior. Acute large territory left MCA infarct with hemorrhagic transformation. 3 mm midline shift to the right unchanged. Electronically Signed   By: Franchot Gallo M.D.   On: 06/12/2021 12:56    PHYSICAL EXAM  Temp:  [98 F (36.7 C)-98.9 F (37.2 C)] 98.4 F (36.9 C) (12/02 0745) Pulse Rate:  [67-86] 78 (12/02 1039) Resp:  [13-20] 18 (12/02 0745) BP: (110-122)/(70-81) 114/76 (12/02 1039) SpO2:  [96 %-100 %] 98 % (12/02 0745)  General - Well nourished, well developed, not in acute distress  Ophthalmologic - fundi not visualized due to noncooperation.  Cardiovascular - irregularly irregular heart rate and rhythm.  Neuro - awake, alert, eyes open, however, still global aphasia, nonverbal, not following simple commands.  Not able to name and repeat. No gaze palsy, tracking bilaterally, however left gaze preference.  Not blinking to visual threat on the right but blinking to visual threat on the left. PERRL.  Mild right facial droop. Tongue midline not corporative. LUE and LLE no drift, right upper extremity against gravity but drifts to bed within 10 seconds,  right lower extremity against gravity no drift but weaker than left. Sensation, coordination and gait not tested.    ASSESSMENT/PLAN [name] with history of migraine headaches, anxiety, gleeding ulcers, HTN and tremor, presenting to the ED via EMS as a Code Stroke after family found her in her recliner at home with aphasia, right hemiplegia, right facial droop and left gaze preference.  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 fib CT head : acute to early subacute left MCA territorial infarct without hemorrhagic transformation. Aspects 4.  CTA head & neck  - No intracranial large vessel occlusion. 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 CT perfusion  - 42 mL left MCA territory core infarct with 14 mL surrounding ischemic penumbra. MRI Evolving left MCA territorial infarct with evidence of hemorrhagic transformation and trace rightward midline shift. CT repeat large MCA infarct with petechial hemorrhage, no signal change slightly increased intracranial mass-effect, 3 mm midline shift  repeat head CT 06/12/21 Stable 2D Echo EF 60 to 65% LE venous Doppler no DVT LDL 139 HgbA1c 5.8 VTE prophylaxis - Heparin subcu No antithrombotic prior to admission, now on aspirin 81 given large stroke with confluent medial hemorrhage.  Will consider DOAC 10-14 days post stroke (06/18/21) if neuro stable Therapy recommendations:  CIR Disposition:  pending   New diagnosed A. Fib Telemetry and EKG confirmed A. Fib Likely the cause of current stroke Low-dose metoprolol 12.5 twice daily for rate control now on aspirin 81 given large stroke with confluent medial hemorrhage.  Will consider DOAC 10-14 days post stroke (06/18/21) if neuro stable  Hypertension Home meds:  ramipril stable Long-term BP goal normotensive  Hyperlipidemia Home meds:  none LDL 139, goal < 70 Add lipitor 40 Continue statin at discharge  Dysphagia on dysphagia 1 and thin liquid Speech on board Off IV fluid  Other Stroke Risk Factors Advanced Age >/= 15  Migraines     Other Active Problems Leukocytosis WBC 15.4 -> 14.8 hiatal hernia   Hospital day # [place], MSN, NP-C [facility] See AMION or use Epic Chat  ATTENDING NOTE: I reviewed above note and agree with the assessment and plan. Pt was seen and examined.   Son at bedside.  Patient doing well, neuro stable, unchanged, no acute event overnight.  Repeat CT head stable stroke and mild midline shift, no significant central cerebral edema.  Continue aspirin 81 for now, but will consider DOAC in 10 to 14 days post stroke given large size  of stroke and confluent petechial hemorrhage if neuro stable at that time.  Continue statin.  A. fib rate controlled on metoprolol.  PT/OT recommend CIR.  For detailed assessment and plan, please refer to above as I have made changes wherever appropriate.   Neurology will sign off. Please call with questions. Pt will follow up with stroke clinic NP at [facility] in about 4 weeks. Thanks for the consult.   Rosalin Hawking, MD PhD Stroke Neurology 06/12/2021 5:04 PM

## 2021-06-12 NOTE — Progress Notes (Signed)
Inpatient Rehab Admissions Coordinator:  Awaiting insurance authorization. Will continue to follow.   Gayland Curry, [place], [place] Admissions Coordinator [phone]

## 2021-06-12 NOTE — Progress Notes (Signed)
Speech Language Pathology Treatment: Dysphagia;Cognitive-Linquistic  Patient Details Name: Mckenzie Guerrero MRN: [id number] DOB: 1950-08-03 Today's Date: 06/12/2021 Time: 1100-1130 SLP Time Calculation (min) (ACUTE ONLY): 30 min  Assessment / Plan / Recommendation Clinical Impression  Fantastic session today, pts attention and initiation has dramatically improved. Pt able to reach out and pick up a bottle of ensure and drink it with no signs of aspiration and minimally delayed oral phase. Pt also attentive to ice cream and after max assist given for use of two hands and hand over hand assist with spoon pt transitioned to independent feeding with a utensil. Very encouraged that pt can achieve adequate nutrition and will even trial upgraded solids next week. Pt still not following any verbal only commands, but she did nod yes with max prosodic and visual cueing. She laughed and raised her hands during tasks to elicit communicative motor movement or speech. Family very encouraged. Asked them to sing with pt or bring music.   HPI HPI: Mckenzie Guerrero is a 70 y.o. female presenting after after family found her in her recliner at home with aphasia, right hemiplegia, right facial droop, and left gaze preference. CT head revealed acute to early subacute left MCA territory infarct without evidence of hemorrhagic transformation. Pt with medical history significant of migraine headaches, bleeding ulcers, hypertension, tremors, hiatal hernia and GERD status post recent laparoscopic Nissen fundoplication on 73/42/8768.      SLP Plan  Continue with current plan of care      Recommendations for follow up therapy are one component of a multi-disciplinary discharge planning process, led by the attending physician.  Recommendations may be updated based on patient status, additional functional criteria and insurance authorization.    Recommendations  Diet recommendations: Dysphagia 1 (puree);Thin liquid Liquids  provided via: Cup;Straw Medication Administration: Crushed with puree Supervision: Staff to assist with self feeding Compensations: Slow rate;Small sips/bites;Minimize environmental distractions Postural Changes and/or Swallow Maneuvers: Seated upright 90 degrees                Follow Up Recommendations: Acute inpatient rehab (3hours/day) Assistance recommended at discharge: Frequent or constant Supervision/Assistance SLP Visit Diagnosis: Dysphagia, oral phase (R13.11) Plan: Continue with current plan of care       GO                Chanson Teems, Katherene Ponto  06/12/2021, 1:36 PM

## 2021-06-13 ENCOUNTER — Inpatient Hospital Stay (HOSPITAL_COMMUNITY)
Admission: RE | Admit: 2021-06-13 | Discharge: 2021-06-30 | DRG: 057 | Disposition: A | Discharge status: Home / Self Care | Payer: PPO | Source: Intra-hospital | Attending: Physical Medicine and Rehabilitation | Admitting: Physical Medicine and Rehabilitation

## 2021-06-13 DIAGNOSIS — K5909 Other constipation: Secondary | ICD-10-CM | POA: Diagnosis present

## 2021-06-13 DIAGNOSIS — R4701 Aphasia: Secondary | ICD-10-CM

## 2021-06-13 DIAGNOSIS — I63512 Cerebral infarction due to unspecified occlusion or stenosis of left middle cerebral artery: Secondary | ICD-10-CM

## 2021-06-13 DIAGNOSIS — I4891 Unspecified atrial fibrillation: Secondary | ICD-10-CM | POA: Diagnosis present

## 2021-06-13 DIAGNOSIS — I69351 Hemiplegia and hemiparesis following cerebral infarction affecting right dominant side: Secondary | ICD-10-CM | POA: Diagnosis present

## 2021-06-13 DIAGNOSIS — R339 Retention of urine, unspecified: Secondary | ICD-10-CM

## 2021-06-13 DIAGNOSIS — I69392 Facial weakness following cerebral infarction: Secondary | ICD-10-CM | POA: Diagnosis not present

## 2021-06-13 DIAGNOSIS — I69391 Dysphagia following cerebral infarction: Secondary | ICD-10-CM | POA: Diagnosis not present

## 2021-06-13 DIAGNOSIS — Z9889 Other specified postprocedural states: Secondary | ICD-10-CM

## 2021-06-13 DIAGNOSIS — Z87891 Personal history of nicotine dependence: Secondary | ICD-10-CM | POA: Diagnosis not present

## 2021-06-13 DIAGNOSIS — I69320 Aphasia following cerebral infarction: Secondary | ICD-10-CM | POA: Diagnosis not present

## 2021-06-13 DIAGNOSIS — M545 Low back pain, unspecified: Secondary | ICD-10-CM | POA: Diagnosis present

## 2021-06-13 DIAGNOSIS — R451 Restlessness and agitation: Secondary | ICD-10-CM | POA: Diagnosis present

## 2021-06-13 DIAGNOSIS — Z515 Encounter for palliative care: Secondary | ICD-10-CM | POA: Diagnosis not present

## 2021-06-13 DIAGNOSIS — G8191 Hemiplegia, unspecified affecting right dominant side: Secondary | ICD-10-CM

## 2021-06-13 DIAGNOSIS — K219 Gastro-esophageal reflux disease without esophagitis: Secondary | ICD-10-CM | POA: Diagnosis present

## 2021-06-13 DIAGNOSIS — I10 Essential (primary) hypertension: Secondary | ICD-10-CM | POA: Diagnosis present

## 2021-06-13 DIAGNOSIS — F419 Anxiety disorder, unspecified: Secondary | ICD-10-CM | POA: Diagnosis present

## 2021-06-13 DIAGNOSIS — F802 Mixed receptive-expressive language disorder: Secondary | ICD-10-CM | POA: Diagnosis present

## 2021-06-13 DIAGNOSIS — I480 Paroxysmal atrial fibrillation: Secondary | ICD-10-CM | POA: Diagnosis present

## 2021-06-13 DIAGNOSIS — Z8719 Personal history of other diseases of the digestive system: Secondary | ICD-10-CM

## 2021-06-13 DIAGNOSIS — D75839 Thrombocytosis, unspecified: Secondary | ICD-10-CM | POA: Diagnosis present

## 2021-06-13 DIAGNOSIS — E871 Hypo-osmolality and hyponatremia: Secondary | ICD-10-CM | POA: Diagnosis present

## 2021-06-13 DIAGNOSIS — D72829 Elevated white blood cell count, unspecified: Secondary | ICD-10-CM | POA: Diagnosis not present

## 2021-06-13 DIAGNOSIS — M4712 Other spondylosis with myelopathy, cervical region: Secondary | ICD-10-CM | POA: Diagnosis present

## 2021-06-13 DIAGNOSIS — D75838 Other thrombocytosis: Secondary | ICD-10-CM | POA: Diagnosis present

## 2021-06-13 DIAGNOSIS — M4722 Other spondylosis with radiculopathy, cervical region: Secondary | ICD-10-CM | POA: Diagnosis present

## 2021-06-13 DIAGNOSIS — R32 Unspecified urinary incontinence: Secondary | ICD-10-CM | POA: Diagnosis present

## 2021-06-13 DIAGNOSIS — I739 Peripheral vascular disease, unspecified: Secondary | ICD-10-CM | POA: Diagnosis present

## 2021-06-13 DIAGNOSIS — R1310 Dysphagia, unspecified: Secondary | ICD-10-CM | POA: Diagnosis present

## 2021-06-13 DIAGNOSIS — Z8669 Personal history of other diseases of the nervous system and sense organs: Secondary | ICD-10-CM

## 2021-06-13 DIAGNOSIS — I639 Cerebral infarction, unspecified: Secondary | ICD-10-CM

## 2021-06-13 DIAGNOSIS — K5900 Constipation, unspecified: Secondary | ICD-10-CM

## 2021-06-13 DIAGNOSIS — Z79899 Other long term (current) drug therapy: Secondary | ICD-10-CM | POA: Diagnosis not present

## 2021-06-13 LAB — GLUCOSE, CAPILLARY
Glucose-Capillary: 112 mg/dL — ABNORMAL HIGH (ref 70–99)
Glucose-Capillary: 117 mg/dL — ABNORMAL HIGH (ref 70–99)
Glucose-Capillary: 97 mg/dL (ref 70–99)

## 2021-06-13 MED ORDER — METOPROLOL SUCCINATE ER 25 MG PO TB24: 12.5000 mg | ORAL_TABLET | Freq: Every day | ORAL | Status: DC

## 2021-06-13 MED ORDER — PROCHLORPERAZINE MALEATE 5 MG PO TABS: 5.0000 mg | ORAL_TABLET | Freq: Four times a day (QID) | ORAL | Status: DC | PRN

## 2021-06-13 MED ORDER — TRAZODONE HCL 50 MG PO TABS
25.0000 mg | ORAL_TABLET | Freq: Every evening | ORAL | Status: DC | PRN
Start: 2021-06-13 — End: 2021-06-30

## 2021-06-13 MED ORDER — ENOXAPARIN SODIUM 40 MG/0.4ML IJ SOSY
40.0000 mg | PREFILLED_SYRINGE | INTRAMUSCULAR | Status: DC
  Administered 2021-06-13 – 2021-06-17 (×5): 40 mg via SUBCUTANEOUS
  Filled 2021-06-13 (×5): qty 0.4

## 2021-06-13 MED ORDER — ASPIRIN 81 MG PO CHEW: 81.0000 mg | CHEWABLE_TABLET | Freq: Every day | ORAL | Status: DC

## 2021-06-13 MED ORDER — ATORVASTATIN CALCIUM 40 MG PO TABS
40.0000 mg | ORAL_TABLET | Freq: Every day | ORAL | Status: DC
  Administered 2021-06-14 – 2021-06-30 (×16): 40 mg via ORAL
  Filled 2021-06-13 (×17): qty 1

## 2021-06-13 MED ORDER — PROCHLORPERAZINE 25 MG RE SUPP: 12.5000 mg | Freq: Four times a day (QID) | RECTAL | Status: DC | PRN

## 2021-06-13 MED ORDER — PROCHLORPERAZINE EDISYLATE 10 MG/2ML IJ SOLN: 5.0000 mg | Freq: Four times a day (QID) | INTRAMUSCULAR | Status: DC | PRN

## 2021-06-13 MED ORDER — POLYETHYLENE GLYCOL 3350 17 G PO PACK
17.0000 g | PACK | Freq: Every day | ORAL | Status: DC | PRN
  Administered 2021-06-29: 06:00:00 17 g via ORAL
  Filled 2021-06-13: qty 1

## 2021-06-13 MED ORDER — ACETAMINOPHEN 325 MG PO TABS
325.0000 mg | ORAL_TABLET | ORAL | Status: DC | PRN
  Administered 2021-06-15 (×2): 650 mg via ORAL
  Filled 2021-06-13 (×2): qty 2

## 2021-06-13 MED ORDER — BISACODYL 10 MG RE SUPP
10.0000 mg | Freq: Every day | RECTAL | Status: DC | PRN
  Administered 2021-06-16: 10 mg via RECTAL
  Filled 2021-06-13: qty 1

## 2021-06-13 MED ORDER — ACETAMINOPHEN 500 MG PO TABS
1000.0000 mg | ORAL_TABLET | Freq: Two times a day (BID) | ORAL | 0 refills | Status: DC | PRN
Start: 2021-06-13 — End: 2021-06-30

## 2021-06-13 MED ORDER — GUAIFENESIN-DM 100-10 MG/5ML PO SYRP: 5.0000 mL | ORAL_SOLUTION | Freq: Four times a day (QID) | ORAL | Status: DC | PRN

## 2021-06-13 MED ORDER — FLEET ENEMA 7-19 GM/118ML RE ENEM
1.0000 | ENEMA | Freq: Once | RECTAL | Status: AC | PRN
  Administered 2021-06-17: 1 via RECTAL
  Filled 2021-06-13: qty 1

## 2021-06-13 MED ORDER — ATORVASTATIN CALCIUM 40 MG PO TABS: 40.0000 mg | ORAL_TABLET | Freq: Every day | ORAL | Status: DC

## 2021-06-13 MED ORDER — ASPIRIN 81 MG PO CHEW
81.0000 mg | CHEWABLE_TABLET | Freq: Every day | ORAL | Status: DC
  Administered 2021-06-13 – 2021-06-18 (×6): 81 mg via ORAL
  Filled 2021-06-13 (×6): qty 1

## 2021-06-13 MED ORDER — ALUM & MAG HYDROXIDE-SIMETH 200-200-20 MG/5ML PO SUSP: 30.0000 mL | ORAL | Status: DC | PRN

## 2021-06-13 MED ORDER — DIPHENHYDRAMINE HCL 12.5 MG/5ML PO ELIX: 12.5000 mg | ORAL_SOLUTION | Freq: Four times a day (QID) | ORAL | Status: DC | PRN

## 2021-06-13 NOTE — Discharge Summary (Signed)
Physician Discharge Summary  Mckenzie Guerrero WNU:[id number] DOB: 09/07/1950 DOA: 06/08/2021  PCP: Raina Mina., MD  Admit date: 06/08/2021 Discharge date: 06/13/2021  Admitted From: home Disposition:  CIR  Recommendations for Outpatient Follow-up:  Follow up with PCP and neurology in 2 weeks following CIR discharge  Home Health: none Equipment/Devices: none  Discharge Condition: stable CODE STATUS: Full code Diet recommendation: heart healthy  HPI: Per admitting MD, Mckenzie Guerrero is a 70 y.o. female with medical history significant of migraine headaches, bleeding ulcers, hypertension, tremors, hiatal hernia and GERD status post recent laparoscopic Nissen fundoplication on 03/47/4259.  She presents to the ED today via EMS as code stroke after family found her in her recliner at home with aphasia, right hemiplegia, right facial droop, and left gaze preference. LKN [id number] per family when they spoke to her on the phone.  Later in the day family was not able to contact her by phone and she was not answering her door, so a family member went into the house and open the door with a spare key to check on her.  When he entered, he found her as above and EMS was called.  EMS noted the same on scene.  Code stroke was called and on arrival to the ED patient's condition was unchanged.  CT head revealed acute to early subacute left MCA territory infarct without evidence of hemorrhagic transformation.  CTA head and neck with CTP: No intracranial LVO.  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  Out of tPA window at the time of presentation.  Not a thrombectomy candidate due to large completed stroke.  Labs notable for WBC 15.4, platelet count 442k.   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  An MRI of the brain obtained later on showed evolving left MCA territory infarct with evidence of hemorrhagic transformation trace rightward midline shift.  Neurology consulted.  Currently on aspirin.  LDL 139, c continue statin.  A1c 5.8 suggesting prediabetes.  2D echo showed EF 60-65%, no WMA, grade 1 diastolic dysfunction.  RV is normal.  No intracardiac source of embolus detected.  Lower extremity Doppler ultrasound was negative for DVT.  Serial CT scans of the head showed large territory left MCA infarct with hemorrhagic transformation, 3 mm midline shift, stable.  PT recommends CIR and will be discharged in stable condition  Active Problems Hyperlipidemia-continue statin Prediabetes-A1c 5.8 Paroxysmal atrial fibrillation-on admission she was in sinus rhythm but telemetry showed A. fib/flutter which was confirmed by twelve-lead EKG.  Rates in the low 100s.  Start metoprolol with holding parameters, systolic less than 563 or heart rate less than 65.  Neurology recommends anticoagulation 10 to 14 days out given petechial hemorrhage in the stroke area. Leukocytosis-possibly reactive, she did have leukocytosis on 11/15 around her surgery, which improved.  WBC improving Hiatal hernia-status post robotic repair with fundoplication on 87/56 4332. Thrombocytosis-possibly reactive Hyponatremia-mild, monitor Essential hypertension-currently on metoprolol, hold home ramipril History of migraines-monitor  Sepsis ruled out   Discharge Instructions  Discharge Instructions     Ambulatory referral to Neurology   Complete by: As directed    Follow up with stroke clinic NP Janett Billow  V[place]chaick or Cecille Rubin, if both not available, consider Zachery Dauer, or Ahern) at [facility] in about 4 weeks. Thanks.      Allergies as  of 06/13/2021       Reactions   Aspirin    Codeine Hives, Itching   Doxycycline Nausea And Vomiting   Nsaids Other (See Comments)   Gi bleeding   Sulfonamide Derivatives Swelling   Prednisone Anxiety        Medication List     STOP taking these medications    ramipril 5 MG capsule Commonly known as: ALTACE       TAKE these medications    acetaminophen 500 MG tablet Commonly known as: TYLENOL Take 2 tablets (1,000 mg total) by mouth 2 (two) times daily as needed for headache. Rapid release What changed:  when to take this reasons to take this   ASCORBIC ACID PO Take 1 mg by mouth daily. Gummy   aspirin 81 MG chewable tablet Chew 1 tablet (81 mg total) by mouth daily. Start taking on: June 14, 2021   atorvastatin 40 MG tablet Commonly known as: LIPITOR Take 1 tablet (40 mg total) by mouth daily. Start taking on: June 14, 2021   cholecalciferol 25 MCG (1000 UNIT) tablet Commonly known as: VITAMIN D3 Take 1,000 Units by mouth daily.   diclofenac Sodium 1 % Gel Commonly known as: VOLTAREN Apply 2 g topically daily as needed (pain on Knees and hands).   HYDROcodone-acetaminophen 5-325 MG tablet Commonly known as: NORCO/VICODIN Take 1 tablet by mouth every 6 (six) hours as needed for moderate pain.   loratadine 10 MG tablet Commonly known as: CLARITIN Take 10 mg by mouth daily.   metoprolol succinate 25 MG 24 hr tablet Commonly known as: TOPROL-XL Take 0.5 tablets (12.5 mg total) by mouth daily. Start taking on: June 14, 2021   multivitamin with minerals Tabs tablet Take 1 tablet by mouth daily. Centrum Silver Woman   ondansetron 4 MG disintegrating tablet Commonly known as: Zofran ODT Take 1 tablet (4 mg total) by mouth every 8 (eight) hours as needed for nausea or vomiting.   Probiotic Caps Take 1 capsule by mouth daily. renew life probiotics   Sinus Pressure + Pain 5-325 MG Tabs Generic drug: Phenylephrine-Acetaminophen Take 1-2  tablets by mouth daily as needed (congestion).   sodium chloride 0.65 % Soln nasal spray Commonly known as: OCEAN Place 1 spray into both nostrils daily as needed for congestion (allergies). Simple saline   SUMAtriptan 50 MG tablet Commonly known as: IMITREX Take 50 mg by mouth every 2 (two) hours as needed for migraine.   SYSTANE OP Place 1 drop into both eyes daily as needed (dry eyes).        Follow-up Information     [facility]. Schedule an appointment as soon as possible for a visit in 1 month(s).   Specialty: Neurology Why: stroke clinic Contact information: [place] [phone]               Consultations: Neurology  Procedures/Studies:  CT HEAD WO CONTRAST (5MM)  Result Date: 06/12/2021 CLINICAL DATA:  Stroke follow-up EXAM: CT HEAD WITHOUT CONTRAST TECHNIQUE: Contiguous axial images were obtained from the base of the skull through the vertex without intravenous contrast. COMPARISON:  CT head 06/10/2021.  MRI 06/09/2021 FINDINGS: Brain: Large territory acute infarct left MCA territory with hemorrhagic transformation. No significant change in infarct distribution or hemorrhage since the prior CT. Mass-effect  and 3 mm midline shift to the right unchanged Ventricle size normal. Mild atrophy and mild chronic microvascular ischemia. Vascular: Negative for hyperdense vessel Skull: Negative Sinuses/Orbits: Negative Other: None IMPRESSION: CT stable from 2 days prior. Acute large territory left MCA infarct with hemorrhagic transformation. 3 mm midline shift to the right unchanged. Electronically Signed   By: Franchot Gallo M.D.   On: 06/12/2021 12:56   CT HEAD WO CONTRAST (5MM)  Result Date: 06/10/2021 CLINICAL DATA:  70 year old female status post left MCA infarct, hemorrhagic transformation. EXAM: CT HEAD WITHOUT CONTRAST TECHNIQUE: Contiguous axial images were obtained from the base of the skull  through the vertex without intravenous contrast. COMPARISON:  Brain MRI 06/09/2021 and earlier. FINDINGS: Brain: Widespread left MCA territory cytotoxic edema has increased in extent since the CT on 06/08/2021, stable compared to the DWI abnormality yesterday. Patchy areas of hyperdense hemorrhagic transformation (series 3, image 20) appears to be primarily confluent petechial hemorrhage. Mass effect on the left lateral ventricle has increased since 06/08/2021, but there is only 3 mm of rightward midline shift. No intraventricular or extra-axial extension of blood. No ventriculomegaly. Basilar cisterns remain normal. Outside of the left MCA territory gray-white matter differentiation remains stable. Vascular: Mild Calcified atherosclerosis at the skull base. No suspicious intracranial vascular hyperdensity. Skull: Negative. Sinuses/Orbits: Visualized paranasal sinuses and mastoids are clear. Other: Visualized orbits and scalp soft tissues are within normal limits. IMPRESSION: 1. Large Left MCA territory infarct with multifocal confluent petechial hemorrhage not significantly changed from the MRI yesterday. Increased intracranial mass effect from 2 days ago, but only 3 mm of rightward midline shift now. Continued CT surveillance may be valuable. 2. No new intracranial abnormality. Electronically Signed   By: Genevie Ann M.D.   On: 06/10/2021 05:48   MR BRAIN WO CONTRAST  Result Date: 06/09/2021 CLINICAL DATA:  Stroke, follow-up, aphasia, right hemiplegia, right facial droop EXAM: MRI HEAD WITHOUT CONTRAST TECHNIQUE: Multiplanar, multiecho pulse sequences of the brain and surrounding structures were obtained without intravenous contrast. COMPARISON:  CT/CTA head and neck dated 1 day prior FINDINGS: Brain: There is extensive diffusion restriction throughout the left MCA territory consistent with evolving early subacute MCA territory infarct there is significant T2* signal dropout consistent with associated hemorrhage.  Edema from the infarct results in diffuse sulcal effacement, partial effacement of the left lateral ventricle, and minimal rightward midline shift. There is no other infarct. The ventricles are otherwise normal in size and configuration. There is no solid mass lesion. Vascular: The right V4 segment flow void is diminutive common better evaluated on the CTA from 1 day prior. The other major flow voids are present. Skull and upper cervical spine: Normal marrow signal. Sinuses/Orbits: Paranasal sinuses are clear. The globes and orbits are unremarkable. Other: None. IMPRESSION: Evolving left MCA territorial infarct with evidence of hemorrhagic transformation and trace rightward midline shift. These results were called by telephone at the time of interpretation on 06/09/2021 at 11:21 am to provider Dr. Nevada Crane, who verbally acknowledged these results. Electronically Signed   By: Valetta Mole M.D.   On: 06/09/2021 11:22   DG CHEST PORT 1 VIEW  Result Date: 06/08/2021 CLINICAL DATA:  Leukocytosis, code stroke. EXAM: PORTABLE CHEST 1 VIEW COMPARISON:  Chest x-ray 04/22/2014. FINDINGS: There is a very large hiatal hernia which has increased in size compared to the prior study. The lungs are clear. There is no pleural effusion or pneumothorax. Cardiomediastinal silhouette is grossly unchanged. Cervical spinal fusion plate is present. No acute fractures are  seen. IMPRESSION: 1. Very large hiatal hernia which has increased in size compared to the prior study. 2. The lungs are clear. Electronically Signed   By: Ronney Asters M.D.   On: 06/08/2021 22:58   DG Swallowing Func-Speech Pathology  Result Date: 06/11/2021 Table formatting from the original result was not included. Objective Swallowing Evaluation: Type of Study: MBS-Modified Barium Swallow Study  Patient Details Name: ALASHIA BROWNFIELD MRN: [id number] Date of Birth: Aug 17, 1950 Today's Date: 06/11/2021 Time: SLP Start Time (ACUTE ONLY): 1200 -SLP Stop Time (ACUTE ONLY):  6195 SLP Time Calculation (min) (ACUTE ONLY): 20 min Past Medical History: Past Medical History: Diagnosis Date  Anxiety   Arthritis   osteoarthritis  Complication of anesthesia   GERD (gastroesophageal reflux disease)   Headache   migraines  History of bleeding ulcers 2010  History of hiatal hernia   Hypertension   Neuromuscular disorder (HCC)   tremors  Occasional tremors   PONV (postoperative nausea and vomiting)  Past Surgical History: Past Surgical History: Procedure Laterality Date  ANTERIOR CERVICAL DECOMP/DISCECTOMY FUSION N/A 09/28/2019  Procedure: Anterior Cervical Discectomy Fusion - Cervical Four-Cervical Five- Cervical Five-Cervical Six;  Surgeon: Earnie Larsson, MD;  Location: [place];  Service: Neurosurgery;  Laterality: N/A;  Anterior Cervical Discectomy Fusion - Cervical Four-Cervical Five- Cervical Five-Cervical Six  benign tumor removal from left breast 2003  2003  BUNIONECTOMY Left   Great toe  COLONOSCOPY    ESOPHAGOGASTRODUODENOSCOPY N/A 05/25/2021  Procedure: ESOPHAGOGASTRODUODENOSCOPY (EGD);  Surgeon: Johnathan Hausen, MD;  Location: [facility];  Service: General;  Laterality: N/A;  XI ROBOTIC ASSISTED HIATAL HERNIA REPAIR N/A 05/25/2021  Procedure: XI ROBOTIC ASSISTED HIATAL HERNIA REPAIR WITH FUNDOPLICATION;  Surgeon: Johnathan Hausen, MD;  Location: [facility];  Service: General;  Laterality: N/A; HPI: Mckenzie Guerrero is a 70 y.o. female presenting after after family found her in her recliner at home with aphasia, right hemiplegia, right facial droop, and left gaze preference. CT head revealed acute to early subacute left MCA territory infarct without evidence of hemorrhagic transformation. Pt with medical history significant of migraine headaches, bleeding ulcers, hypertension, tremors, hiatal hernia and GERD status post recent laparoscopic Nissen fundoplication on 09/32/6712.  No data recorded  Recommendations for follow up therapy are one component of a multi-disciplinary discharge planning process,  led by the attending physician.  Recommendations may be updated based on patient status, additional functional criteria and insurance authorization. Assessment / Plan / Recommendation Clinical Impressions 06/11/2021 Clinical Impression Pt demosntrates a severe oral dysphagia due primarily to cognitive impairment. Pt is very poorly aware of efforts to feed her and only minimally responds to hand over hand assisted feeding. It is very difficult to introduce liquids orally as pt does not fully open mouth to accept them and blocks entrance with tongue. If bolus can be inserted (via cup, spoon or syringe) pt does initiate lingual pumping and will eventually trigger a normal pharyngeal swallow once she gets some sensory feedback from bolus on base of tongue. Recommend pt initiate a puree/thin diet though adequate oral intake will be limited and effortful. Recommend a palliative consult for this pt and family to discuss plan of care given expectation of poor nutritional intake SLP Visit Diagnosis Dysphagia, oral phase (R13.11) Attention and concentration deficit following -- Frontal lobe and executive function deficit following -- Impact on safety and function Risk for inadequate nutrition/hydration   Treatment Recommendations 06/11/2021 Treatment Recommendations Therapy as outlined in treatment plan below   No flowsheet data found. Diet Recommendations 06/11/2021  SLP Diet Recommendations Dysphagia 1 (Puree) solids;Thin liquid Liquid Administration via Cup;Other (Comment);Spoon Medication Administration Crushed with puree Compensations -- Postural Changes Seated upright at 90 degrees   Other Recommendations 06/11/2021 Recommended Consults -- Oral Care Recommendations Oral care BID Other Recommendations Have oral suction available Follow Up Recommendations Acute inpatient rehab (3hours/day) Assistance recommended at discharge Frequent or constant Supervision/Assistance Functional Status Assessment Patient has had a recent  decline in their functional status and demonstrates the ability to make significant improvements in function in a reasonable and predictable amount of time. Frequency and Duration  06/10/2021 Speech Therapy Frequency (ACUTE ONLY) min 2x/week Treatment Duration --   Oral Phase 06/11/2021 Oral Phase Impaired Oral - Pudding Teaspoon -- Oral - Pudding Cup -- Oral - Honey Teaspoon -- Oral - Honey Cup -- Oral - Nectar Teaspoon -- Oral - Nectar Cup -- Oral - Nectar Straw -- Oral - Thin Teaspoon -- Oral - Thin Cup Left anterior bolus loss;Right anterior bolus loss;Lingual pumping;Holding of bolus;Left pocketing in lateral sulci;Right pocketing in lateral sulci;Lingual/palatal residue;Delayed oral transit;Decreased bolus cohesion Oral - Thin Straw -- Oral - Puree Left anterior bolus loss;Right anterior bolus loss;Lingual pumping;Holding of bolus;Left pocketing in lateral sulci;Right pocketing in lateral sulci;Lingual/palatal residue;Delayed oral transit;Decreased bolus cohesion Oral - Mech Soft -- Oral - Regular -- Oral - Multi-Consistency -- Oral - Pill -- Oral Phase - Comment --  Pharyngeal Phase 06/11/2021 Pharyngeal Phase WFL Pharyngeal- Pudding Teaspoon -- Pharyngeal -- Pharyngeal- Pudding Cup -- Pharyngeal -- Pharyngeal- Honey Teaspoon -- Pharyngeal -- Pharyngeal- Honey Cup -- Pharyngeal -- Pharyngeal- Nectar Teaspoon -- Pharyngeal -- Pharyngeal- Nectar Cup -- Pharyngeal -- Pharyngeal- Nectar Straw -- Pharyngeal -- Pharyngeal- Thin Teaspoon -- Pharyngeal -- Pharyngeal- Thin Cup -- Pharyngeal -- Pharyngeal- Thin Straw -- Pharyngeal -- Pharyngeal- Puree -- Pharyngeal -- Pharyngeal- Mechanical Soft -- Pharyngeal -- Pharyngeal- Regular -- Pharyngeal -- Pharyngeal- Multi-consistency -- Pharyngeal -- Pharyngeal- Pill -- Pharyngeal -- Pharyngeal Comment --  No flowsheet data found. DeBlois, Katherene Ponto 06/11/2021, 2:41 PM                     ECHOCARDIOGRAM COMPLETE  Result Date: 06/09/2021    ECHOCARDIOGRAM REPORT    Patient Name:   BEVERLYANN BROXTERMAN Date of Exam: 06/09/2021 Medical Rec #:  [id number]       Height:       65.0 in Accession #:    [id number]      Weight:       135.4 lb Date of Birth:  11/05/1950       BSA:          1.676 m Patient Age:    24 years        BP:           148/96 mmHg Patient Gender: F               HR:           78 bpm. Exam Location:  Inpatient Procedure: 2D Echo, Cardiac Doppler and Color Doppler Indications:    Stroke  History:        Patient has no prior history of Echocardiogram examinations.                 Risk Factors:Hypertension.  Sonographer:    Glo Herring Referring Phys: [id number] [place]  1. Left ventricular ejection fraction, by estimation, is 60 to 65%. The left ventricle has normal function. The left ventricle has no regional wall motion  abnormalities. Left ventricular diastolic parameters are consistent with Grade I diastolic dysfunction (impaired relaxation).  2. Right ventricular systolic function is normal. The right ventricular size is normal. There is normal pulmonary artery systolic pressure. The estimated right ventricular systolic pressure is 96.2 mmHg.  3. Left atrial size was mildly dilated.  4. Right atrial size was mildly dilated.  5. The mitral valve is grossly normal. Trivial mitral valve regurgitation. No evidence of mitral stenosis.  6. The aortic valve is tricuspid. Aortic valve regurgitation is trivial. No aortic stenosis is present.  7. There is moderate dilatation of the ascending aorta, measuring 43 mm.  8. The inferior vena cava is normal in size with greater than 50% respiratory variability, suggesting right atrial pressure of 3 mmHg. Conclusion(s)/Recommendation(s): No intracardiac source of embolism detected on this transthoracic study. Consider a transesophageal echocardiogram to exclude cardiac source of embolism if clinically indicated. FINDINGS  Left Ventricle: Left ventricular ejection fraction, by estimation, is 60 to 65%. The left  ventricle has normal function. The left ventricle has no regional wall motion abnormalities. The left ventricular internal cavity size was normal in size. There is  no left ventricular hypertrophy. Left ventricular diastolic parameters are consistent with Grade I diastolic dysfunction (impaired relaxation). Right Ventricle: The right ventricular size is normal. No increase in right ventricular wall thickness. Right ventricular systolic function is normal. There is normal pulmonary artery systolic pressure. The tricuspid regurgitant velocity is 2.14 m/s, and  with an assumed right atrial pressure of 3 mmHg, the estimated right ventricular systolic pressure is 95.2 mmHg. Left Atrium: Left atrial size was mildly dilated. Right Atrium: Right atrial size was mildly dilated. Pericardium: Trivial pericardial effusion is present. Presence of epicardial fat layer. Mitral Valve: The mitral valve is grossly normal. Trivial mitral valve regurgitation. No evidence of mitral valve stenosis. Tricuspid Valve: The tricuspid valve is grossly normal. Tricuspid valve regurgitation is trivial. No evidence of tricuspid stenosis. Aortic Valve: The aortic valve is tricuspid. Aortic valve regurgitation is trivial. No aortic stenosis is present. Aortic valve mean gradient measures 3.0 mmHg. Aortic valve peak gradient measures 4.7 mmHg. Pulmonic Valve: The pulmonic valve was grossly normal. Pulmonic valve regurgitation is trivial. No evidence of pulmonic stenosis. Aorta: The aortic root is normal in size and structure. There is moderate dilatation of the ascending aorta, measuring 43 mm. Venous: The inferior vena cava is normal in size with greater than 50% respiratory variability, suggesting right atrial pressure of 3 mmHg. IAS/Shunts: The atrial septum is grossly normal.  LEFT VENTRICLE PLAX 2D LVIDd:         3.00 cm     Diastology LVIDs:         1.90 cm     LV e' medial:    6.85 cm/s LV PW:         1.00 cm     LV E/e' medial:  10.5 LV IVS:         1.00 cm     LV e' lateral:   8.92 cm/s                            LV E/e' lateral: 8.0  LV Volumes (MOD) LV vol d, MOD A2C: 62.5 ml LV vol d, MOD A4C: 67.9 ml LV vol s, MOD A2C: 26.0 ml LV vol s, MOD A4C: 26.7 ml LV SV MOD A2C:     36.5 ml LV SV MOD A4C:  67.9 ml LV SV MOD BP:      38.6 ml RIGHT VENTRICLE             IVC RV Basal diam:  3.40 cm     IVC diam: 1.95 cm RV S prime:     12.10 cm/s LEFT ATRIUM           Index        RIGHT ATRIUM           Index LA diam:      2.40 cm 1.43 cm/m   RA Area:     20.50 cm LA Vol (A2C): 60.9 ml 36.34 ml/m  RA Volume:   52.90 ml  31.57 ml/m  AORTIC VALVE                   PULMONIC VALVE AV Vmax:           108.00 cm/s PV Vmax:       1.18 m/s AV Vmean:          82.000 cm/s PV Peak grad:  5.6 mmHg AV VTI:            0.221 m AV Peak Grad:      4.7 mmHg AV Mean Grad:      3.0 mmHg LVOT Vmax:         99.80 cm/s LVOT Vmean:        70.400 cm/s LVOT VTI:          0.202 m LVOT/AV VTI ratio: 0.91  AORTA Ao Root diam: 3.90 cm Ao Asc diam:  4.30 cm MITRAL VALVE               TRICUSPID VALVE MV Area (PHT): 3.45 cm    TR Peak grad:   18.3 mmHg MV Decel Time: 220 msec    TR Vmax:        214.00 cm/s MV E velocity: 71.60 cm/s MV A velocity: 71.60 cm/s  SHUNTS MV E/A ratio:  1.00        Systemic VTI: 0.20 m Eleonore Chiquito MD Electronically signed by Eleonore Chiquito MD Signature Date/Time: 06/09/2021/1:20:55 PM    Final    CT HEAD CODE STROKE WO CONTRAST  Result Date: 06/08/2021 CLINICAL DATA:  Code stroke.  Right-sided weakness EXAM: CT HEAD WITHOUT CONTRAST TECHNIQUE: Contiguous axial images were obtained from the base of the skull through the vertex without intravenous contrast. COMPARISON:  CT head 04/19/2010 FINDINGS: Brain: There is confluent hypodensity throughout the left MCA territory consistent with acute to early subacute infarct. There is no evidence of hemorrhagic transformation. There is swelling of the involved gyri with effacement of the sulci but no midline shift.  There is no evidence of acute intracranial hemorrhage. The ventricles are normal in size. There is no solid mass lesion. Vascular: No definite dense vessel sign is seen. Skull: Normal. Negative for fracture or focal lesion. Sinuses/Orbits: The paranasal sinuses are clear. The globes and orbits are unremarkable. Other: None. ASPECTS [facility] Stroke Program Early CT Score) - Ganglionic level infarction (caudate, lentiform nuclei, internal capsule, insula, M1-M3 cortex): 3 - Supraganglionic infarction (M4-M6 cortex): 1 (M5 appears to be spared) Total score (0-10 with 10 being normal): 4 IMPRESSION: 1. Acute to early subacute left MCA territorial infarct without evidence of hemorrhagic transformation. 2. ASPECTS is 4 These results were called by telephone at the time of interpretation on 06/08/2021 at 6:56 pm to provider Dr Cheral Marker, who verbally acknowledged these results. Electronically Signed  By: Valetta Mole M.D.   On: 06/08/2021 19:05   VAS [place] LOWER EXTREMITY VENOUS (DVT)  Result Date: 06/09/2021  Lower Venous DVT Study Patient Name:  DENNI FRANCE  Date of Exam:   06/09/2021 Medical Rec #: [id number]        Accession #:    [id number] Date of Birth: [birth year]        Patient Gender: F Patient Age:   74 years Exam Location:  [facility] Procedure:      VAS [place] LOWER EXTREMITY VENOUS (DVT) Referring Phys: Cornelius Moras XU --------------------------------------------------------------------------------  Indications: Stroke.  Limitations: Poor patient cooperation. Comparison Study: No prior study Performing Technologist: Maudry Mayhew MHA, RDMS, RVT, RDCS  Examination Guidelines: A complete evaluation includes B-mode imaging, spectral Doppler, color Doppler, and power Doppler as needed of all accessible portions of each vessel. Bilateral testing is considered an integral part of a complete examination. Limited examinations for reoccurring indications may be performed as noted. The reflux portion of the exam  is performed with the patient in reverse Trendelenburg.  +---------+---------------+---------+-----------+----------+--------------+ RIGHT    CompressibilityPhasicitySpontaneityPropertiesThrombus Aging +---------+---------------+---------+-----------+----------+--------------+ CFV      Full           Yes      Yes                                 +---------+---------------+---------+-----------+----------+--------------+ SFJ      Full                                                        +---------+---------------+---------+-----------+----------+--------------+ FV Prox  Full                                                        +---------+---------------+---------+-----------+----------+--------------+ FV Mid   Full                                                        +---------+---------------+---------+-----------+----------+--------------+ FV DistalFull                                                        +---------+---------------+---------+-----------+----------+--------------+ PFV      Full                                                        +---------+---------------+---------+-----------+----------+--------------+ POP      Full           Yes      Yes                                 +---------+---------------+---------+-----------+----------+--------------+  PTV      Full                                                        +---------+---------------+---------+-----------+----------+--------------+ PERO     Full                                                        +---------+---------------+---------+-----------+----------+--------------+   +---------+---------------+---------+-----------+----------+--------------+ LEFT     CompressibilityPhasicitySpontaneityPropertiesThrombus Aging +---------+---------------+---------+-----------+----------+--------------+ CFV      Full           Yes      Yes                                  +---------+---------------+---------+-----------+----------+--------------+ SFJ      Full                                                        +---------+---------------+---------+-----------+----------+--------------+ FV Prox  Full                                                        +---------+---------------+---------+-----------+----------+--------------+ FV Mid   Full                                                        +---------+---------------+---------+-----------+----------+--------------+ FV DistalFull                                                        +---------+---------------+---------+-----------+----------+--------------+ PFV      Full                                                        +---------+---------------+---------+-----------+----------+--------------+ POP      Full           Yes      Yes                                 +---------+---------------+---------+-----------+----------+--------------+ PTV      Full                                                        +---------+---------------+---------+-----------+----------+--------------+  PERO     Full                                                        +---------+---------------+---------+-----------+----------+--------------+  Limited evaluation of bilateral calf veins due to poor cooperation.    Summary: RIGHT: - There is no evidence of deep vein thrombosis in the lower extremity. However, portions of this examination were limited- see technologist comments above.  - No cystic structure found in the popliteal fossa.  LEFT: - There is no evidence of deep vein thrombosis in the lower extremity. However, portions of this examination were limited- see technologist comments above.  - No cystic structure found in the popliteal fossa.  *See table(s) above for measurements and observations. Electronically signed by Harold Barban MD on 06/09/2021 at 7:03:56 PM.    Final     CT ANGIO HEAD NECK W WO CM W PERF (CODE STROKE)  Result Date: 06/08/2021 CLINICAL DATA:  Acute neurologic deficit EXAM: CT ANGIOGRAPHY HEAD AND NECK CT PERFUSION BRAIN TECHNIQUE: Multidetector CT imaging of the head and neck was performed using the standard protocol during bolus administration of intravenous contrast. Multiplanar CT image reconstructions and MIPs were obtained to evaluate the vascular anatomy. Carotid stenosis measurements (when applicable) are obtained utilizing NASCET criteria, using the distal internal carotid diameter as the denominator. Multiphase CT imaging of the brain was performed following IV bolus contrast injection. Subsequent parametric perfusion maps were calculated using RAPID software. CONTRAST:  125mL OMNIPAQUE IOHEXOL 350 MG/ML SOLN COMPARISON:  Head CT 06/08/2021 FINDINGS: CTA NECK FINDINGS SKELETON: There is no bony spinal canal stenosis. No lytic or blastic lesion. OTHER NECK: Normal pharynx, larynx and major salivary glands. No cervical lymphadenopathy. Unremarkable thyroid gland. UPPER CHEST: No pneumothorax or pleural effusion. No nodules or masses. AORTIC ARCH: There is calcific atherosclerosis of the aortic arch. There is no aneurysm, dissection or hemodynamically significant stenosis of the visualized portion of the aorta. Conventional 3 vessel aortic branching pattern. The visualized proximal subclavian arteries are widely patent. RIGHT CAROTID SYSTEM: Normal without aneurysm, dissection or stenosis. LEFT CAROTID SYSTEM: Normal without aneurysm, dissection or stenosis. VERTEBRAL ARTERIES: Left dominant configuration. 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 Left vertebral artery is normal. CTA HEAD FINDINGS POSTERIOR CIRCULATION: --Vertebral arteries: Normal V4 segments. --Inferior cerebellar arteries: Normal. --Basilar artery: Normal. --Superior cerebellar arteries: Normal. --Posterior cerebral arteries (PCA): Normal. ANTERIOR  CIRCULATION: --Intracranial internal carotid arteries: Normal. --Anterior cerebral arteries (ACA): Normal. Both A1 segments are present. Patent anterior communicating artery (a-comm). --Middle cerebral arteries (MCA): Normal. VENOUS SINUSES: As permitted by contrast timing, patent. ANATOMIC VARIANTS: None Review of the MIP images confirms the above findings. CT Brain Perfusion Findings: ASPECTS: 4 CBF (<30%) Volume: 18mL Perfusion (Tmax>6.0s) volume: 34mL Mismatch Volume: 92mL Infarction Location:Left MCA territory IMPRESSION: 1. No intracranial large vessel occlusion. 2. 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 3. 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 Aortic Atherosclerosis (ICD10-I70.0). Electronically Signed   By: Ulyses Jarred M.D.   On: 06/08/2021 19:23     Subjective: -Nonverbal, alert, denies any discomfort  Discharge Exam: BP 100/68 (BP Location: Right Arm)   Pulse 69   Temp 97.9 F (36.6 C) (Oral)   Resp 16  Ht 5\' 5"  (1.651 m)   Wt 61.4 kg   SpO2 98%   BMI 22.53 kg/m   General: Pt is alert, awake, not in acute distress Cardiovascular: RRR, S1/S2 +, no rubs, no gallops Respiratory: CTA bilaterally, no wheezing, no rhonchi Abdominal: Soft, NT, ND, bowel sounds + Extremities: no edema, no cyanosis  The results of significant diagnostics from this hospitalization (including imaging, microbiology, ancillary and laboratory) are listed below for reference.     Microbiology: Recent Results (from the past 240 hour(s))  Resp Panel by RT-PCR (Flu A&B, Covid) Urine, Clean Catch     Status: None   Collection Time: 06/08/21 10:45 PM   Specimen: Urine, Clean Catch; Nasopharyngeal(NP) swabs in vial transport medium  Result Value Ref Range Status   SARS Coronavirus 2 by RT PCR NEGATIVE NEGATIVE Final    Comment: (NOTE) SARS-CoV-2 target nucleic acids are NOT DETECTED.  The SARS-CoV-2 RNA is generally detectable  in upper respiratory specimens during the acute phase of infection. The lowest concentration of SARS-CoV-2 viral copies this assay can detect is 138 copies/mL. A negative result does not preclude SARS-Cov-2 infection and should not be used as the sole basis for treatment or other patient management decisions. A negative result may occur with  improper specimen collection/handling, submission of specimen other than nasopharyngeal swab, presence of viral mutation(s) within the areas targeted by this assay, and inadequate number of viral copies(<138 copies/mL). A negative result must be combined with clinical observations, patient history, and epidemiological information. The expected result is Negative.  Fact Sheet for Patients:  EntrepreneurPulse.com.au  Fact Sheet for Healthcare Providers:  IncredibleEmployment.be  This test is no t yet approved or cleared by the [place] FDA and  has been authorized for detection and/or diagnosis of SARS-CoV-2 by FDA under an Emergency Use Authorization (EUA). This EUA will remain  in effect (meaning this test can be used) for the duration of the COVID-19 declaration under Section 564(b)(1) of the Act, 21 U.S.C.section 360bbb-3(b)(1), unless the authorization is terminated  or revoked sooner.       Influenza A by PCR NEGATIVE NEGATIVE Final   Influenza B by PCR NEGATIVE NEGATIVE Final    Comment: (NOTE) The Xpert Xpress SARS-CoV-2/FLU/RSV plus assay is intended as an aid in the diagnosis of influenza from Nasopharyngeal swab specimens and should not be used as a sole basis for treatment. Nasal washings and aspirates are unacceptable for Xpert Xpress SARS-CoV-2/FLU/RSV testing.  Fact Sheet for Patients: EntrepreneurPulse.com.au  Fact Sheet for Healthcare Providers: IncredibleEmployment.be  This test is not yet approved or cleared by the [place] FDA and has  been authorized for detection and/or diagnosis of SARS-CoV-2 by FDA under an Emergency Use Authorization (EUA). This EUA will remain in effect (meaning this test can be used) for the duration of the COVID-19 declaration under Section 564(b)(1) of the Act, 21 U.S.C. section 360bbb-3(b)(1), unless the authorization is terminated or revoked.  Performed at [facility], [place] [address], [place], [place] [address]      Labs: Basic Metabolic Panel: Recent Labs  Lab 06/08/21 1839 06/08/21 1844 06/11/21 0120 06/12/21 0350  NA 136 138 134* 135  K 3.5 3.6 3.5 3.6  CL 105 107 104 105  CO2 23  --  23 24  GLUCOSE 125* 121* 120* 114*  BUN 10 10 16 18   CREATININE 0.61 0.50 0.61 0.69  CALCIUM 8.7*  --  8.7* 8.7*  MG  --   --  2.0  --  Liver Function Tests: Recent Labs  Lab 06/08/21 1839 06/11/21 0120  AST 21 19  ALT 20 14  ALKPHOS 77 77  BILITOT 0.5 0.7  PROT 6.3* 6.1*  ALBUMIN 3.5 3.1*   CBC: Recent Labs  Lab 06/08/21 1839 06/08/21 1844 06/09/21 0433 06/11/21 0120 06/12/21 0350  WBC 15.4*  --  21.9* 17.6* 14.8*  NEUTROABS 12.5*  --   --   --   --   HGB 13.0 13.3 14.6 12.9 12.0  HCT 39.9 39.0 44.7 39.4 35.9*  MCV 93.0  --  91.6 91.6 91.3  PLT 442*  --  515* 418* 404*   CBG: Recent Labs  Lab 06/12/21 0037 06/12/21 0604 06/12/21 1208 06/13/21 0048 06/13/21 0626  GLUCAP 119* 114* 147* 112* 97   Hgb A1c No results for input(s): HGBA1C in the last 72 hours. Lipid Profile No results for input(s): CHOL, HDL, LDLCALC, TRIG, CHOLHDL, LDLDIRECT in the last 72 hours. Thyroid function studies No results for input(s): TSH, T4TOTAL, T3FREE, THYROIDAB in the last 72 hours.  Invalid input(s): FREET3 Urinalysis    Component Value Date/Time   COLORURINE YELLOW 06/08/2021 2226   APPEARANCEUR CLOUDY (A) 06/08/2021 2226   LABSPEC >1.046 (H) 06/08/2021 2226   PHURINE 9.0 (H) 06/08/2021 2226   GLUCOSEU NEGATIVE 06/08/2021 2226   HGBUR NEGATIVE 06/08/2021 2226    BILIRUBINUR NEGATIVE 06/08/2021 2226   KETONESUR 5 (A) 06/08/2021 2226   PROTEINUR NEGATIVE 06/08/2021 2226   NITRITE NEGATIVE 06/08/2021 2226   LEUKOCYTESUR NEGATIVE 06/08/2021 2226    FURTHER DISCHARGE INSTRUCTIONS:   Get Medicines reviewed and adjusted: Please take all your medications with you for your next visit with your Primary MD   Laboratory/radiological data: Please request your Primary MD to go over all hospital tests and procedure/radiological results at the follow up, please ask your Primary MD to get all Hospital records sent to his/her office.   In some cases, they will be blood work, cultures and biopsy results pending at the time of your discharge. Please request that your primary care M.D. goes through all the records of your hospital data and follows up on these results.   Also Note the following: If you experience worsening of your admission symptoms, develop shortness of breath, life threatening emergency, suicidal or homicidal thoughts you must seek medical attention immediately by calling 911 or calling your MD immediately  if symptoms less severe.   You must read complete instructions/literature along with all the possible adverse reactions/side effects for all the Medicines you take and that have been prescribed to you. Take any new Medicines after you have completely understood and accpet all the possible adverse reactions/side effects.    Do not drive when taking Pain medications or sleeping medications (Benzodaizepines)   Do not take more than prescribed Pain, Sleep and Anxiety Medications. It is not advisable to combine anxiety,sleep and pain medications without talking with your primary care practitioner   Special Instructions: If you have smoked or chewed Tobacco  in the last 2 yrs please stop smoking, stop any regular Alcohol  and or any Recreational drug use.   Wear Seat belts while driving.   Please note: You were cared for by a hospitalist during your  hospital stay. Once you are discharged, your primary care physician will handle any further medical issues. Please note that NO REFILLS for any discharge medications will be authorized once you are discharged, as it is imperative that you return to your primary care physician (or establish a  relationship with a primary care physician if you do not have one) for your post hospital discharge needs so that they can reassess your need for medications and monitor your lab values.  Time coordinating discharge: 40 minutes  SIGNED:  Marzetta Board, MD, PhD 06/13/2021, 11:26 AM

## 2021-06-13 NOTE — Progress Notes (Signed)
Report given to Al 4w reflecting patient's current status.

## 2021-06-13 NOTE — Plan of Care (Signed)
  Problem: Education: Goal: Knowledge of disease or condition will improve Outcome: Progressing   Problem: Coping: Goal: Will identify appropriate support needs Outcome: Progressing   Problem: Health Behavior/Discharge Planning: Goal: Ability to manage health-related needs will improve Outcome: Progressing   Problem: Education: Goal: Knowledge of patient specific risk factors will improve (INDIVIDUALIZE FOR PATIENT) Outcome: Progressing   Problem: Education: Goal: Knowledge of secondary prevention will improve (SELECT ALL) Outcome: Progressing

## 2021-06-13 NOTE — H&P (Signed)
Physical Medicine and Rehabilitation Admission H&P    Chief Complaint  Patient presents with   Functional deficits due to stroke.     HPI: Mckenzie Guerrero is a 70  year old female with history of HTN, migraines, anxiety, PUD, tremors, large HH repair 05/25/21  who was admitted on 06/08/2021 after found in recline with right hemiparesis, right facial weakness with difficulty speaking and left gaze preference.  History taken from chart review and family due to cognition.  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 She was out of window for tPA and not thrombectomy candidate due to completed stroke.  MRI brain showed evolving left MCA infarct with hemorrhagic transformation and trace rightward midline shift. She was kept NPO till swallow eval and has been advanced to D1 thin liquids. Follow up CT head 12/02 showed large left MCA infarct with hemorrhagic transformation and 3 mm shift to the right. A fib was captured by [facility] and she was started on low dose beta-blocker. Dr. Erlinda Hong recommends anticoagulation 10-15 days post stroke due to hemorrhage and if neurologically stable. Dr. Erlinda Hong felt that stroke was due to newly diagnosed atrial fibrillation. She continues on low dose ASA with repeat CT head on 12/2 stable. She continues to be limited by right hemiparesis with left gaze preference with right inattention, balance deficits and is able to follow simple one step commands inconsistently. CIR recommended due to functional decline.  Please see preadmission assessment from earlier today as well.   Review of Systems  Unable to perform ROS: Language    Past Medical History:  Diagnosis Date   Anxiety    Arthritis    osteoarthritis   Complication of anesthesia    GERD (gastroesophageal reflux disease)    Headache    migraines   History of bleeding ulcers 2010   History of hiatal hernia     Hypertension    Neuromuscular disorder (HCC)    tremors   Occasional tremors    PONV (postoperative nausea and vomiting)     Past Surgical History:  Procedure Laterality Date   ANTERIOR CERVICAL DECOMP/DISCECTOMY FUSION N/A 09/28/2019   Procedure: Anterior Cervical Discectomy Fusion - Cervical Four-Cervical Five- Cervical Five-Cervical Six;  Surgeon: Earnie Larsson, MD;  Location: [place];  Service: Neurosurgery;  Laterality: N/A;  Anterior Cervical Discectomy Fusion - Cervical Four-Cervical Five- Cervical Five-Cervical Six   benign tumor removal from left breast 2003  2003   BUNIONECTOMY Left    Great toe   COLONOSCOPY     ESOPHAGOGASTRODUODENOSCOPY N/A 05/25/2021   Procedure: ESOPHAGOGASTRODUODENOSCOPY (EGD);  Surgeon: Johnathan Hausen, MD;  Location: [facility];  Service: General;  Laterality: N/A;   XI ROBOTIC ASSISTED HIATAL HERNIA REPAIR N/A 05/25/2021   Procedure: XI ROBOTIC ASSISTED HIATAL HERNIA REPAIR WITH FUNDOPLICATION;  Surgeon: Johnathan Hausen, MD;  Location: [facility];  Service: General;  Laterality: N/A;    Family History: Unable to elicit due to aphasia.    Social History:  Was living alone and independent TPA. Per  reports that she has quit smoking. She has never used smokeless tobacco. She reports that she does not drink alcohol and does not use drugs.   Allergies  Allergen Reactions   Aspirin    Codeine Hives and Itching   Doxycycline Nausea And Vomiting   Nsaids Other (See Comments)    Gi bleeding   Sulfonamide Derivatives Swelling  Prednisone Anxiety    Medications Prior to Admission  Medication Sig Dispense Refill   ramipril (ALTACE) 5 MG capsule Take 5 mg by mouth 2 (two) times daily.     ASCORBIC ACID PO Take 1 mg by mouth daily. Gummy     cholecalciferol (VITAMIN D3) 25 MCG (1000 UNIT) tablet Take 1,000 Units by mouth daily.     diclofenac Sodium (VOLTAREN) 1 % GEL Apply 2 g topically daily as needed (pain on Knees and hands).     HYDROcodone-acetaminophen  (NORCO/VICODIN) 5-325 MG tablet Take 1 tablet by mouth every 6 (six) hours as needed for moderate pain. 15 tablet 0   loratadine (CLARITIN) 10 MG tablet Take 10 mg by mouth daily.     Multiple Vitamin (MULTIVITAMIN WITH MINERALS) TABS tablet Take 1 tablet by mouth daily. Centrum Silver Woman     ondansetron (ZOFRAN ODT) 4 MG disintegrating tablet Take 1 tablet (4 mg total) by mouth every 8 (eight) hours as needed for nausea or vomiting. 20 tablet 0   Phenylephrine-Acetaminophen (SINUS PRESSURE + PAIN) 5-325 MG TABS Take 1-2 tablets by mouth daily as needed (congestion).     Polyethyl Glycol-Propyl Glycol (SYSTANE OP) Place 1 drop into both eyes daily as needed (dry eyes).     Probiotic CAPS Take 1 capsule by mouth daily. renew life probiotics     sodium chloride (OCEAN) 0.65 % SOLN nasal spray Place 1 spray into both nostrils daily as needed for congestion (allergies). Simple saline     SUMAtriptan (IMITREX) 50 MG tablet Take 50 mg by mouth every 2 (two) hours as needed for migraine.      [DISCONTINUED] acetaminophen (TYLENOL) 500 MG tablet Take 1,000 mg by mouth 2 (two) times daily. Rapid release      Drug Regimen Review  Drug regimen was reviewed and remains appropriate with no significant issues identified  Home: Home Living Family/patient expects to be discharged to:: Private residence Living Arrangements: Alone Available Help at Discharge: Family Type of Home: House Home Access: Stairs to enter Technical brewer of Steps: 1 Entrance Stairs-Rails: None Home Layout: Two level Alternate Level Stairs-Number of Steps: flight Alternate Level Stairs-Rails: Right Bathroom Shower/Tub: Multimedia programmer: Standard Home Equipment: None   Functional History: Prior Function Prior Level of Function : Independent/Modified Independent  Functional Status:  Mobility: Bed Mobility Overal bed mobility: Needs Assistance Bed Mobility: Sit to Supine Supine to sit: Mod assist,  HOB elevated Sit to supine: Mod assist General bed mobility comments: required assistance with BLEs Transfers Overall transfer level: Needs assistance Equipment used: 1 person hand held assist Transfers: Sit to/from Stand Sit to Stand: Mod assist Bed to/from chair/wheelchair/BSC transfer type:: Step pivot Step pivot transfers: Mod assist General transfer comment: for safety +2 for equipment with mobilty and transfers Ambulation/Gait Ambulation/Gait assistance: Mod assist, +2 safety/equipment Gait Distance (Feet): 120 Feet Assistive device: 1 person hand held assist, 2 person hand held assist Gait Pattern/deviations: Step-to pattern, Step-through pattern, Decreased stride length, Ataxic, Drifts right/left General Gait Details: hand hold on L and cues for forward gaze, son helped with telemetry box and spouse walked with pt as well at times holding R elbow    ADL: ADL Overall ADL's : Needs assistance/impaired Eating/Feeding: NPO Grooming: Wash/dry hands, Wash/dry face, Minimal assistance, Sitting, Cueing for sequencing Grooming Details (indicate cue type and reason): Patient stood at sink for grooming tasks. Upper Body Bathing: Sitting, Moderate assistance Lower Body Bathing: Maximal assistance, +2 for safety/equipment, Sit to/from stand Upper Body  Dressing : Moderate assistance, Sitting Upper Body Dressing Details (indicate cue type and reason): doffed old gown and donned new gown Lower Body Dressing: Maximal assistance, +2 for safety/equipment, Sit to/from stand Toilet Transfer: Moderate assistance, +2 for safety/equipment, Regular Toilet, Ambulation, Grab bars Toilet Transfer Details (indicate cue type and reason): required assistance of 2 due to equipment Toileting- Clothing Manipulation and Hygiene: Maximal assistance, Sit to/from stand Toileting - Clothing Manipulation Details (indicate cue type and reason): patient used rail for balance during toilet hygiene Functional mobility  during ADLs: Moderate assistance, +2 for safety/equipment General ADL Comments: Patient required increased time and often repeated cues to follow commands  Cognition: Cognition Overall Cognitive Status: Impaired/Different from baseline Arousal/Alertness: Lethargic Orientation Level: Oriented to person Attention: Focused Focused Attention: Impaired Focused Attention Impairment: Verbal basic, Functional basic Awareness: Impaired Awareness Impairment: Intellectual impairment Problem Solving: Impaired Problem Solving Impairment: Verbal basic, Functional basic Safety/Judgment: Impaired Cognition Arousal/Alertness: Awake/alert Behavior During Therapy: Flat affect, Impulsive Overall Cognitive Status: Impaired/Different from baseline Area of Impairment: Attention, Following commands, Safety/judgement Current Attention Level: Focused Following Commands: Follows one step commands inconsistently Safety/Judgement: Decreased awareness of safety, Decreased awareness of deficits General Comments: increased time to follow commands Difficult to assess due to: Impaired communication   Blood pressure 100/68, pulse 69, temperature 97.9 F (36.6 C), temperature source Oral, resp. rate 16, height 5\' 5"  (1.651 m), weight 61.4 kg, SpO2 98 %. Physical Exam Vitals and nursing note reviewed.  Constitutional:      General: She is not in acute distress.    Appearance: Normal appearance. She is not ill-appearing.     Comments: Fatigued appearing.  HENT:     Head: Normocephalic and atraumatic.     Right Ear: External ear normal.     Left Ear: External ear normal.     Nose: Nose normal.  Eyes:     General:        Right eye: No discharge.        Left eye: No discharge.     Extraocular Movements: Extraocular movements intact.  Cardiovascular:     Comments: Irregularly irregular Pulmonary:     Effort: Pulmonary effort is normal. No respiratory distress.     Breath sounds: No stridor.  Abdominal:      General: Abdomen is flat. Bowel sounds are normal.  Genitourinary:    Comments: Foley in place Musculoskeletal:     Cervical back: Normal range of motion and neck supple.     Comments: No edema or tenderness in extremities  Skin:    General: Skin is warm and dry.  Neurological:     Mental Status: She is alert.     Comments: Global aphasia Alert Nods yes to all questions.  Unable to point or follow motor commands without tactile cues.  Right facial weakness.  Motor: Spontaneously moves all extremities, >/3/5 throughout  Psychiatric:     Comments: Unable to assess due to mentation    Results for orders placed or performed during the hospital encounter of 06/08/21 (from the past 48 hour(s))  Glucose, capillary     Status: Abnormal   Collection Time: 06/11/21  2:25 PM  Result Value Ref Range   Glucose-Capillary 131 (H) 70 - 99 mg/dL    Comment: Glucose reference range applies only to samples taken after fasting for at least 8 hours.  Glucose, capillary     Status: Abnormal   Collection Time: 06/11/21  6:13 PM  Result Value Ref Range   Glucose-Capillary 126 (H)  70 - 99 mg/dL    Comment: Glucose reference range applies only to samples taken after fasting for at least 8 hours.   Comment 1 Notify RN    Comment 2 Document in Chart   Glucose, capillary     Status: Abnormal   Collection Time: 06/12/21 12:37 AM  Result Value Ref Range   Glucose-Capillary 119 (H) 70 - 99 mg/dL    Comment: Glucose reference range applies only to samples taken after fasting for at least 8 hours.  Basic metabolic panel     Status: Abnormal   Collection Time: 06/12/21  3:50 AM  Result Value Ref Range   Sodium 135 135 - 145 mmol/L   Potassium 3.6 3.5 - 5.1 mmol/L   Chloride 105 98 - 111 mmol/L   CO2 24 22 - 32 mmol/L   Glucose, Bld 114 (H) 70 - 99 mg/dL    Comment: Glucose reference range applies only to samples taken after fasting for at least 8 hours.   BUN 18 8 - 23 mg/dL   Creatinine, Ser 0.69 0.44  - 1.00 mg/dL   Calcium 8.7 (L) 8.9 - 10.3 mg/dL   GFR, Estimated >60 >60 mL/min    Comment: (NOTE) Calculated using the CKD-EPI Creatinine Equation (2021)    Anion gap 6 5 - 15    Comment: Performed at [facility] [address] [place], [place] [address]  CBC     Status: Abnormal   Collection Time: 06/12/21  3:50 AM  Result Value Ref Range   WBC 14.8 (H) 4.0 - 10.5 K/uL   RBC 3.93 3.87 - 5.11 MIL/uL   Hemoglobin 12.0 12.0 - 15.0 g/dL   HCT 35.9 (L) 36.0 - 46.0 %   MCV 91.3 80.0 - 100.0 fL   MCH 30.5 26.0 - 34.0 pg   MCHC 33.4 30.0 - 36.0 g/dL   RDW 12.3 11.5 - 15.5 %   Platelets 404 (H) 150 - 400 K/uL   nRBC 0.0 0.0 - 0.2 %    Comment: Performed at [facility], [place] [address] [place], [place] [address]  Glucose, capillary     Status: Abnormal   Collection Time: 06/12/21  6:04 AM  Result Value Ref Range   Glucose-Capillary 114 (H) 70 - 99 mg/dL    Comment: Glucose reference range applies only to samples taken after fasting for at least 8 hours.  Glucose, capillary     Status: Abnormal   Collection Time: 06/12/21 12:08 PM  Result Value Ref Range   Glucose-Capillary 147 (H) 70 - 99 mg/dL    Comment: Glucose reference range applies only to samples taken after fasting for at least 8 hours.   Comment 1 Notify RN    Comment 2 Document in Chart   Glucose, capillary     Status: Abnormal   Collection Time: 06/13/21 12:48 AM  Result Value Ref Range   Glucose-Capillary 112 (H) 70 - 99 mg/dL    Comment: Glucose reference range applies only to samples taken after fasting for at least 8 hours.  Glucose, capillary     Status: None   Collection Time: 06/13/21  6:26 AM  Result Value Ref Range   Glucose-Capillary 97 70 - 99 mg/dL    Comment: Glucose reference range applies only to samples taken after fasting for at least 8 hours.  Glucose, capillary     Status: Abnormal   Collection Time: 06/13/21 12:39 PM  Result Value Ref Range   Glucose-Capillary  117 (H) 70 - 99 mg/dL     Comment: Glucose reference range applies only to samples taken after fasting for at least 8 hours.   CT HEAD WO CONTRAST (5MM)  Result Date: 06/12/2021 CLINICAL DATA:  Stroke follow-up EXAM: CT HEAD WITHOUT CONTRAST TECHNIQUE: Contiguous axial images were obtained from the base of the skull through the vertex without intravenous contrast. COMPARISON:  CT head 06/10/2021.  MRI 06/09/2021 FINDINGS: Brain: Large territory acute infarct left MCA territory with hemorrhagic transformation. No significant change in infarct distribution or hemorrhage since the prior CT. Mass-effect and 3 mm midline shift to the right unchanged Ventricle size normal. Mild atrophy and mild chronic microvascular ischemia. Vascular: Negative for hyperdense vessel Skull: Negative Sinuses/Orbits: Negative Other: None IMPRESSION: CT stable from 2 days prior. Acute large territory left MCA infarct with hemorrhagic transformation. 3 mm midline shift to the right unchanged. Electronically Signed   By: Franchot Gallo M.D.   On: 06/12/2021 12:56       Medical Problem List and Plan: 1. 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  -patient may shower  -ELOS/Goals: 16-19 days/min A  Admit to CIR 2.  Antithrombotics: -DVT/anticoagulation:  Pharmaceutical: Lovenox  -antiplatelet therapy:  ASA daily 3. Pain Management: Tylenol prn.  4. Mood: LCSW to follow for evaluation and support.   -antipsychotic agents: N/A 5. Neuropsych: This patient is not capable of making decisions on her own behalf. 6. Skin/Wound Care: Routine pressure relief measures.  7. Fluids/Electrolytes/Nutrition: Monitor I/Os.  CMP ordered 8. A fib: Will monitor BP TID--on metoprolol bid for rate cotnrol  Monitor with increased activity 9. H/o Migraines: Was on sumatriptan PTA.    Monitor for increase in migraines, prior to killing association with vascular headaches 10.  Leucocytosis: Monitor for fevers and other signs of infection.   CBC ordered 11. Urinary retention? Incontinence?: Foley in place   Will plan for voiding trial  Bary Leriche, PA-C 06/13/2021  I have personally performed a face to face diagnostic evaluation, including, but not limited to relevant history and physical exam findings, of this patient and developed relevant assessment and plan.  Additionally, I have reviewed and concur with the physician assistant's documentation above.  Delice Lesch, MD, ABPMR  The patient's status has not changed. Any changes from the pre-admission screening or documentation from the acute chart are noted above.   Delice Lesch, MD, ABPMR

## 2021-06-13 NOTE — Progress Notes (Signed)
Signed                                                                                                                                                                                                                                                                                                                                                                                                                                                                                            PMR Admission Coordinator Pre-Admission Assessment   Patient: Mckenzie Guerrero is an 70 y.o., female MRN: [id number] DOB: 1951/01/04 Height: 5' 5"  (165.1 cm) Weight: 61.4 kg   Insurance Information HMO:     PPO: Yes     PCP:       IPA:       80/20:       OTHER:   PRIMARY: [employer]   Policy#: [id number]      Subscriber: patient CM Name: Mckenzie Guerrero      Phone#: [phone]     Fax#: [phone] Pre-Cert#: Approval # [id number] Received approval on 06/12/21 from [place]. Pt approved for 7 days.      Employer: Retired Benefits:  Phone #:  [phone]     Name: April Eff. Date: 10/11/15     Deduct: $0      Out of Pocket Max: $3400 (met $352.36)      Life Max: N/A CIR: $325 days 1-6      SNF: $0 days 1-20; $184 days 21-100 Outpatient: medical necessity     Co-Pay: $15/visit Home Health: 100%      Co-Pay: none DME: 80%     Co-Pay: 20% Providers: in network  SECONDARY: Medicaid of [place]      Policy#: [id number]     Phone#: [phone]   Financial Counselor:        Phone#:     The "Data Collection Information Summary" for patients in Inpatient Rehabilitation Facilities with attached "Privacy Act [place] Records" was provided and verbally reviewed with: Patient and Family   Emergency Contact Information Contact Information       Name Relation Home  Work Mckenzie Guerrero [phone]   [phone]    Mckenzie Guerrero [phone]   [phone]    Mckenzie Guerrero [phone]   [phone]    Mckenzie Guerrero [phone]   [phone]           Current Medical History  Patient Admitting Diagnosis: L MCA infarct   History of Present Illness:  A 70 y.o. female with medical history significant of migraine headaches, bleeding ulcers, hypertension, tremors, hiatal hernia and GERD status post recent laparoscopic Nissen fundoplication on 81/27/5170.  She presents to the ED today via EMS as code stroke after family found her in her recliner at home with aphasia, right hemiplegia, right facial droop, and left gaze preference. LKN 0900 per family when they spoke to her on the phone.  Later in the day family was not able to contact her by phone and she was not answering her door, so a family member went into the house and open the door with a spare key to check on her.  When he entered, he found her as above and EMS was called.  EMS noted the same on scene.  Code stroke was called and on arrival to the ED patient's condition was unchanged.  CT head revealed acute to early subacute left MCA territory infarct without evidence of hemorrhagic transformation.  CTA head and neck with CTP: No intracranial LVO.  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  Out of tPA window at the time of presentation.  Not a thrombectomy candidate due to large completed stroke.  Labs notable for WBC 15.4, platelet count 442k.    Complete NIHSS TOTAL: 14   Patient's medical record from [facility] has been reviewed by the rehabilitation admission coordinator and physician.   Past Medical History      Past Medical History:  Diagnosis Date   Anxiety     Arthritis      osteoarthritis   Complication of anesthesia     GERD (gastroesophageal reflux  disease)     Headache      migraines   History of bleeding ulcers 2010   History of hiatal hernia     Hypertension     Neuromuscular disorder (HCC)      tremors   Occasional tremors     PONV (postoperative nausea and vomiting)        Has the patient had major surgery during 100 days prior to admission? Yes  Family History   family history is not on file.   Current Medications   Current Facility-Administered Medications:    acetaminophen (TYLENOL) tablet 650 mg, 650 mg, Oral, Q4H PRN, 650 mg at 06/12/21 2118 **OR** acetaminophen (TYLENOL) 160 MG/5ML solution 650 mg, 650 mg, Per Tube, Q4H PRN **OR** acetaminophen (TYLENOL) suppository 650 mg, 650 mg, Rectal, Q4H PRN, Shela Leff, MD   aspirin chewable tablet 81 mg, 81 mg, Oral, Daily, Gherghe, Costin M, MD, 81 mg at 06/12/21 1038   atorvastatin (LIPITOR) tablet 40 mg, 40 mg, Oral, Daily, Rosalin Hawking, MD, 40 mg at 06/12/21 1038   Chlorhexidine Gluconate Cloth 2 % PADS 6 each, 6 each, Topical, Daily, Chotiner, Yevonne Aline, MD, 6 each at 06/12/21 1044   heparin injection 5,000 Units, 5,000 Units, Subcutaneous, Q8H, Rosalin Hawking, MD, 5,000 Units at 06/13/21 0654   labetalol (NORMODYNE) injection 5 mg, 5 mg, Intravenous, Q2H PRN, Hall, Carole N, DO, 5 mg at 06/09/21 1148   metoprolol tartrate (LOPRESSOR) tablet 12.5 mg, 12.5 mg, Oral, BID, Cruzita Lederer, Costin M, MD, 12.5 mg at 06/12/21 2107   Patients Current Diet:  Diet Order                  DIET - DYS 1 Room service appropriate? Yes; Fluid consistency: Thin  Diet effective now                         Precautions / Restrictions Precautions Precautions: Fall Precaution Comments: Pt with recent R hernia repairl R inattention, aphasic Restrictions Weight Bearing Restrictions: No    Has the patient had 2 or more falls or a fall with injury in the past year? Yes   Prior Activity Level Community (5-7x/wk): Went out daily.  Was driving.  Retired 1-2 yrs ago.   Prior  Functional Level Self Care: Did the patient need help bathing, dressing, using the toilet or eating? Independent   Indoor Mobility: Did the patient need assistance with walking from room to room (with or without device)? Independent   Stairs: Did the patient need assistance with internal or external stairs (with or without device)? Independent   Functional Cognition: Did the patient need help planning regular tasks such as shopping or remembering to take medications? Independent   Patient Information Are you of Hispanic, Latino/a,or Spanish origin?: A. No, not of Hispanic, Latino/a, or Spanish origin What is your race?: A. White Do you need or want an interpreter to communicate with a doctor or health care staff?: 0. No   Patient's Response To:  Health Literacy and Transportation Is the patient able to respond to health literacy and transportation needs?: Yes Health Literacy - How often do you need to have someone help you when you read instructions, pamphlets, or other written material from your doctor or pharmacy?: Never In the past 12 months, has lack of transportation kept you from medical appointments or from getting medications?: No In the past 12 months, has lack of transportation kept you from meetings, work, or from getting things needed for daily living?: No   Home Assistive Devices / Equipment Home Equipment: None   Prior Device Use: Indicate devices/aids used by the patient prior to current illness, exacerbation or injury? None of the above   Current Functional Level Cognition   Arousal/Alertness: Lethargic Overall Cognitive Status: Impaired/Different from baseline Difficult to assess due to: Impaired communication Current Attention Level: Focused Orientation Level: Oriented to person Following Commands: Follows one step commands inconsistently  Safety/Judgement: Decreased awareness of safety, Decreased awareness of deficits General Comments: increased time to follow  commands Attention: Focused Focused Attention: Impaired Focused Attention Impairment: Verbal basic, Functional basic Awareness: Impaired Awareness Impairment: Intellectual impairment Problem Solving: Impaired Problem Solving Impairment: Verbal basic, Functional basic Safety/Judgment: Impaired    Extremity Assessment (includes Sensation/Coordination)   Upper Extremity Assessment: Difficult to assess due to impaired cognition (difficult to assess due to limited command following. BUE PROM is [facility]. Pt reaching for functional items (comb) with L hand, noted RUE movement with bed mobility and transferring)  Lower Extremity Assessment: Defer to PT evaluation RLE Deficits / Details: Pt with non purposeful movement at times. Also would move RLE when PT was assisting. Could not respond to verbal cues to move RLE appropriately. Did not have response to painful stimuli     ADLs   Overall ADL's : Needs assistance/impaired Eating/Feeding: NPO Grooming: Wash/dry hands, Wash/dry face, Minimal assistance, Sitting, Cueing for sequencing Grooming Details (indicate cue type and reason): Patient stood at sink for grooming tasks. Upper Body Bathing: Sitting, Moderate assistance Lower Body Bathing: Maximal assistance, +2 for safety/equipment, Sit to/from stand Upper Body Dressing : Moderate assistance, Sitting Upper Body Dressing Details (indicate cue type and reason): doffed old gown and donned new gown Lower Body Dressing: Maximal assistance, +2 for safety/equipment, Sit to/from stand Toilet Transfer: Moderate assistance, +2 for safety/equipment, Regular Toilet, Ambulation, Grab bars Toilet Transfer Details (indicate cue type and reason): required assistance of 2 due to equipment Toileting- Clothing Manipulation and Hygiene: Maximal assistance, Sit to/from stand Toileting - Clothing Manipulation Details (indicate cue type and reason): patient used rail for balance during toilet hygiene Functional mobility  during ADLs: Moderate assistance, +2 for safety/equipment General ADL Comments: Patient required increased time and often repeated cues to follow commands     Mobility   Overal bed mobility: Needs Assistance Bed Mobility: Sit to Supine Supine to sit: Mod assist, HOB elevated Sit to supine: Mod assist General bed mobility comments: required assistance with BLEs     Transfers   Overall transfer level: Needs assistance Equipment used: 1 person hand held assist Transfers: Sit to/from Stand Sit to Stand: Mod assist Bed to/from chair/wheelchair/BSC transfer type:: Step pivot Step pivot transfers: Mod assist General transfer comment: for safety +2 for equipment with mobilty and transfers     Ambulation / Gait / Stairs / Wheelchair Mobility   Ambulation/Gait Ambulation/Gait assistance: Mod assist, +2 safety/equipment Gait Distance (Feet): 120 Feet Assistive device: 1 person hand held assist, 2 person hand held assist Gait Pattern/deviations: Step-to pattern, Step-through pattern, Decreased stride length, Ataxic, Drifts right/left General Gait Details: hand hold on L and cues for forward gaze, Guerrero helped with telemetry box and spouse walked with pt as well at times holding R elbow     Posture / Balance Dynamic Sitting Balance Sitting balance - Comments: right lateral lean when on EOB Balance Overall balance assessment: Needs assistance Sitting-balance support: Feet supported Sitting balance-Leahy Scale: Poor Sitting balance - Comments: right lateral lean when on EOB Postural control: Right lateral lean Standing balance support: Bilateral upper extremity supported Standing balance-Leahy Scale: Poor Standing balance comment: used rail in bathroom to assist with balance and used sink     Special needs/care consideration Skin Post op abdominal incisions with dressings in place.    Previous Home Environment (from acute therapy documentation) Living Arrangements: Alone Available Help at  Discharge: Family Type of Home: House Home Layout: Two level Alternate Level Stairs-Rails: Right Alternate Level Stairs-Number  of Steps: flight Home Access: Stairs to enter Entrance Stairs-Rails: None Entrance Stairs-Number of Steps: 1 Bathroom Shower/Tub: Multimedia programmer: Standard   Discharge Living Setting Plans for Discharge Living Setting: Patient's home, House, Alone (Family to work out 24/7 plan for supervision) Type of Home at Discharge: House Discharge Home Layout: Two level, Other (Comment) (Bedroom main level, sink upstairs in kitchen.) Alternate Level Stairs-Number of Steps: 10-13 steps Discharge Home Access: Level entry Discharge Bathroom Shower/Tub: Walk-in shower, Curtain Discharge Bathroom Toilet: Standard Discharge Bathroom Accessibility: Yes How Accessible: Accessible via walker   [place] Patient Roles: Parent (Has sons and daughter in laws.) Contact Information: Jenilyn Magana - Guerrero - [phone] Anticipated Caregiver: sons Ability/Limitations of Caregiver: Sons to coordinate 24/7 caregiver support after rehab stay Caregiver Availability: Other (Comment) (Family aware of need for 24/7 supervision after discharge) Discharge Plan Discussed with Primary Caregiver: Yes Is Caregiver In Agreement with Plan?: Yes Does Caregiver/Family have Issues with Lodging/Transportation while Pt is in Rehab?: No   Goals Patient/Family Goal for Rehab: PT/OT/SLP supervision goals Expected length of stay: 12 days Pt/Family Agrees to Admission and willing to participate: Yes Program Orientation Provided & Reviewed with Pt/Caregiver Including Roles  & Responsibilities: Yes   Decrease burden of Care through IP rehab admission: N/A   Possible need for SNF placement upon discharge: Not planned   Patient Condition: I have reviewed medical records from [facility], spoken with CM, and patient, Guerrero, and family member. I met with patient at the bedside  for inpatient rehabilitation assessment.  Patient will benefit from ongoing PT, OT, and SLP, can actively participate in 3 hours of therapy a day 5 days of the week, and can make measurable gains during the admission.  Patient will also benefit from the coordinated team approach during an Inpatient Acute Rehabilitation admission.  The patient will receive intensive therapy as well as Rehabilitation physician, nursing, social worker, and care management interventions.  Due to bladder management, bowel management, safety, skin/wound care, disease management, medication administration, pain management, and patient education the patient requires 24 hour a day rehabilitation nursing.  The patient is currently Mod A with mobility and Mod A with basic ADLs.  Discharge setting and therapy post discharge at home with home health is anticipated.  Patient has agreed to participate in the Acute Inpatient Rehabilitation Program and will admit today.   Preadmission Screen Completed By:  Retta Diones, with updates by Gayland Curry 06/13/2021 8:58 AM ______________________________________________________________________   Discussed status with Dr. Posey Pronto on 06/13/21  at 8:58 AM and received approval for admission today.   Admission Coordinator:  Retta Diones, RN, with updates by Gayland Curry, MS, CCC-SLP time 8:58 AM/Date 06/13/21     Assessment/Plan: Diagnosis: L MCA infarct Does the need for close, 24 hr/day Medical supervision in concert with the patient's rehab needs make it unreasonable for this patient to be served in a less intensive setting? Yes Co-Morbidities requiring supervision/potential complications: migraine headaches, bleeding ulcers, hypertension, tremors, hiatal hernia and GERD status post recent laparoscopic Nissen fundoplication  Due to bladder management, safety, disease management, medication administration, and patient education, does the patient require 24 hr/day rehab  nursing? Yes Does the patient require coordinated care of a physician, rehab nurse, PT, OT, and SLP to address physical and functional deficits in the context of the above medical diagnosis(es)? Yes Addressing deficits in the following areas: balance, endurance, locomotion, strength, transferring, bathing, dressing, toileting, cognition, swallowing, and psychosocial support Can the patient  actively participate in an intensive therapy program of at least 3 hrs of therapy 5 days a week? Yes The potential for patient to make measurable gains while on inpatient rehab is excellent Anticipated functional outcomes upon discharge from inpatient rehab: supervision and min assist PT, supervision and min assist OT, supervision and min assist SLP Estimated rehab length of stay to reach the above functional goals is: 12-15 days. Anticipated discharge destination: Home 10. Overall Rehab/Functional Prognosis: good     MD Signature: Delice Lesch, MD, ABPMR

## 2021-06-14 DIAGNOSIS — R339 Retention of urine, unspecified: Secondary | ICD-10-CM | POA: Diagnosis not present

## 2021-06-14 DIAGNOSIS — D72829 Elevated white blood cell count, unspecified: Secondary | ICD-10-CM | POA: Diagnosis not present

## 2021-06-14 DIAGNOSIS — Z8669 Personal history of other diseases of the nervous system and sense organs: Secondary | ICD-10-CM | POA: Diagnosis not present

## 2021-06-14 DIAGNOSIS — I69391 Dysphagia following cerebral infarction: Secondary | ICD-10-CM | POA: Diagnosis not present

## 2021-06-14 DIAGNOSIS — I63512 Cerebral infarction due to unspecified occlusion or stenosis of left middle cerebral artery: Secondary | ICD-10-CM | POA: Diagnosis not present

## 2021-06-14 DIAGNOSIS — G8191 Hemiplegia, unspecified affecting right dominant side: Secondary | ICD-10-CM

## 2021-06-14 LAB — COMPREHENSIVE METABOLIC PANEL
ALT: 21 U/L (ref 0–44)
AST: 27 U/L (ref 15–41)
Albumin: 3 g/dL — ABNORMAL LOW (ref 3.5–5.0)
Alkaline Phosphatase: 81 U/L (ref 38–126)
Anion gap: 10 (ref 5–15)
BUN: 24 mg/dL — ABNORMAL HIGH (ref 8–23)
CO2: 23 mmol/L (ref 22–32)
Calcium: 8.7 mg/dL — ABNORMAL LOW (ref 8.9–10.3)
Chloride: 105 mmol/L (ref 98–111)
Creatinine, Ser: 0.69 mg/dL (ref 0.44–1.00)
GFR, Estimated: >60 mL/min (ref >60)
Glucose, Bld: 111 mg/dL — ABNORMAL HIGH (ref 70–99)
Potassium: 3.7 mmol/L (ref 3.5–5.1)
Sodium: 138 mmol/L (ref 135–145)
Total Bilirubin: 0.6 mg/dL (ref 0.3–1.2)
Total Protein: 6.1 g/dL — ABNORMAL LOW (ref 6.5–8.1)

## 2021-06-14 LAB — CBC WITH DIFFERENTIAL/PLATELET
Abs Immature Granulocytes: 0.07 10*3/uL (ref 0.00–0.07)
Basophils Absolute: 0.1 10*3/uL (ref 0.0–0.1)
Basophils Relative: 1 %
Eosinophils Absolute: 0.2 10*3/uL (ref 0.0–0.5)
Eosinophils Relative: 1 %
HCT: 36.7 % (ref 36.0–46.0)
Hemoglobin: 12.2 g/dL (ref 12.0–15.0)
Immature Granulocytes: 0 %
Lymphocytes Relative: 14 %
Lymphs Abs: 2.2 10*3/uL (ref 0.7–4.0)
MCH: 30.5 pg (ref 26.0–34.0)
MCHC: 33.2 g/dL (ref 30.0–36.0)
MCV: 91.8 fL (ref 80.0–100.0)
Monocytes Absolute: 1.3 10*3/uL — ABNORMAL HIGH (ref 0.1–1.0)
Monocytes Relative: 8 %
Neutro Abs: 12.4 10*3/uL — ABNORMAL HIGH (ref 1.7–7.7)
Neutrophils Relative %: 76 %
Platelets: 412 10*3/uL — ABNORMAL HIGH (ref 150–400)
RBC: 4 MIL/uL (ref 3.87–5.11)
RDW: 12.4 % (ref 11.5–15.5)
WBC: 16.1 10*3/uL — ABNORMAL HIGH (ref 4.0–10.5)
nRBC: 0 % (ref 0.0–0.2)

## 2021-06-14 LAB — GLUCOSE, CAPILLARY: Glucose-Capillary: 101 mg/dL — ABNORMAL HIGH (ref 70–99)

## 2021-06-14 LAB — OCCULT BLOOD X 1 CARD TO LAB, STOOL: Fecal Occult Bld: POSITIVE — AB

## 2021-06-14 MED ORDER — METOPROLOL SUCCINATE ER 25 MG PO TB24
12.5000 mg | ORAL_TABLET | Freq: Every day | ORAL | Status: DC
  Administered 2021-06-15 – 2021-06-30 (×11): 12.5 mg via ORAL
  Filled 2021-06-14 (×17): qty 1

## 2021-06-14 MED ORDER — SORBITOL 70 % SOLN
30.0000 mL | Freq: Every day | Status: DC | PRN
  Administered 2021-06-17: 30 mL via ORAL
  Filled 2021-06-14 (×2): qty 30

## 2021-06-14 MED ORDER — CHLORHEXIDINE GLUCONATE CLOTH 2 % EX PADS
6.0000 | MEDICATED_PAD | Freq: Every day | CUTANEOUS | Status: DC
  Administered 2021-06-14: 17:00:00 6 via TOPICAL

## 2021-06-14 NOTE — Progress Notes (Signed)
Pt  had X2 loose tarry stool.  Notified Dr.Patel. New order received verbally for occult blood card. Passed on to next shift. Pt is stable.

## 2021-06-14 NOTE — Evaluation (Signed)
Occupational Therapy Assessment and Plan  Patient Details  Name: Mckenzie Guerrero MRN: [id number] Date of Birth: 07/07/1951  OT Diagnosis: abnormal posture, altered mental status, cognitive deficits, hemiplegia affecting dominant side, and muscle weakness (generalized) Rehab Potential:   ELOS: 12-14 days   Today's Date: 06/14/2021 OT Individual Time: 3295-1884 OT Individual Time Calculation (min): 64 min     Hospital Problem: Principal Problem:   Acute ischemic left middle cerebral artery (MCA) stroke ([place])   Past Medical History:  Past Medical History:  Diagnosis Date   Anxiety    Arthritis    osteoarthritis   Complication of anesthesia    GERD (gastroesophageal reflux disease)    Headache    migraines   History of bleeding ulcers 2010   History of hiatal hernia    Hypertension    Neuromuscular disorder (HCC)    tremors   Occasional tremors    PONV (postoperative nausea and vomiting)    Past Surgical History:  Past Surgical History:  Procedure Laterality Date   ANTERIOR CERVICAL DECOMP/DISCECTOMY FUSION N/A 09/28/2019   Procedure: Anterior Cervical Discectomy Fusion - Cervical Four-Cervical Five- Cervical Five-Cervical Six;  Surgeon: Mckenzie Larsson, MD;  Location: [place];  Service: Neurosurgery;  Laterality: N/A;  Anterior Cervical Discectomy Fusion - Cervical Four-Cervical Five- Cervical Five-Cervical Six   benign tumor removal from left breast 2003  2003   BUNIONECTOMY Left    Great toe   COLONOSCOPY     ESOPHAGOGASTRODUODENOSCOPY N/A 05/25/2021   Procedure: ESOPHAGOGASTRODUODENOSCOPY (EGD);  Surgeon: Mckenzie Hausen, MD;  Location: [facility];  Service: General;  Laterality: N/A;   XI ROBOTIC ASSISTED HIATAL HERNIA REPAIR N/A 05/25/2021   Procedure: XI ROBOTIC ASSISTED HIATAL HERNIA REPAIR WITH FUNDOPLICATION;  Surgeon: Mckenzie Hausen, MD;  Location: [facility];  Service: General;  Laterality: N/A;    Assessment & Plan Clinical Impression: Mckenzie Guerrero is a 70  year old  female with history of HTN, migraines, anxiety, PUD, tremors, large HH repair 05/25/21  who was admitted on 06/08/2021 after found in recline with right hemiparesis, right facial weakness with difficulty speaking and left gaze preference.  History taken from chart review and family due to cognition.  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 She was out of window for tPA and not thrombectomy candidate due to completed stroke.  MRI brain showed evolving left MCA infarct with hemorrhagic transformation and trace rightward midline shift. She was kept NPO till swallow eval and has been advanced to D1 thin liquids. Follow up CT head 12/02 showed large left MCA infarct with hemorrhagic transformation and 3 mm shift to the right. A fib was captured by [facility] and she was started on low dose beta-blocker. Dr. Erlinda Guerrero recommends anticoagulation 10-15 days post stroke due to hemorrhage and if neurologically stable. Dr. Erlinda Guerrero felt that stroke was due to newly diagnosed atrial fibrillation. She continues on low dose ASA with repeat CT head on 12/2 stable. She continues to be limited by right hemiparesis with left gaze preference with right inattention, balance deficits and is able to follow simple one step commands inconsistently. CIR recommended due to functional decline.  Please see preadmission assessment from earlier today as well. Patient transferred to CIR on 06/13/2021 .    Patient currently requires mod with basic self-care skills secondary to muscle weakness, decreased cardiorespiratoy endurance, impaired timing and sequencing, unbalanced muscle activation, decreased coordination, and  decreased motor planning, Right inattention, decreased attention to right and decreased motor planning, decreased initiation, decreased attention, decreased awareness, decreased problem solving, decreased safety awareness, and delayed  processing, and decreased sitting balance, decreased standing balance, decreased postural control, hemiplegia, and decreased balance strategies.  Prior to hospitalization, patient could complete BADL with independent .  Patient will benefit from skilled intervention to decrease level of assist with basic self-care skills and increase independence with basic self-care skills prior to discharge home with care partner.  Anticipate patient will require 24 hour supervision and follow up outpatient.  OT - End of Session Activity Tolerance: Tolerates 30+ min activity with multiple rests Endurance Deficit: Yes OT Assessment OT Barriers to Discharge: Incontinence OT Patient demonstrates impairments in the following area(s): Balance;Cognition;Edema;Endurance;Motor;Perception;Safety;Sensory;Vision OT Basic ADL's Functional Problem(s): Eating;Grooming;Bathing;Dressing;Toileting OT Transfers Functional Problem(s): Toilet;Tub/Shower OT Additional Impairment(s): Fuctional Use of Upper Extremity OT Plan OT Intensity: Minimum of 1-2 x/day, 45 to 90 minutes OT Frequency: 5 out of 7 days OT Duration/Estimated Length of Stay: 12-14 days OT Treatment/Interventions: Balance/vestibular training;Discharge planning;Functional electrical stimulation;Pain management;Self Care/advanced ADL retraining;Therapeutic Activities;UE/LE Coordination activities;Visual/perceptual remediation/compensation;Therapeutic Exercise;Skin care/wound managment;Patient/family education;Functional mobility training;Disease mangement/prevention;Cognitive remediation/compensation;Community reintegration;DME/adaptive equipment instruction;Neuromuscular re-education;Psychosocial support;Splinting/orthotics;UE/LE Strength taining/ROM;Wheelchair propulsion/positioning OT Self Feeding Anticipated Outcome(s): Supervision OT Basic Self-Care Anticipated Outcome(s): Supervision OT Toileting Anticipated Outcome(s): Supervision OT Bathroom Transfers  Anticipated Outcome(s): Supervision OT Recommendation Patient destination: Home Follow Up Recommendations: Outpatient OT Equipment Recommended: To be determined   OT Evaluation Precautions/Restrictions  Precautions Precautions: Fall Precaution Comments: Pt with recent R hernia repair R inattention, aphasic, impulsive Restrictions Weight Bearing Restrictions: No Pain Pain Assessment Pain Scale: 0-10 Pain Score: 0-No pain (based on behavior, pt nonverbal) Faces Pain Scale: No hurt Home Living/Prior Functioning Home Living Available Help at Discharge: Family Type of Home: House Home Access: Stairs to enter Technical brewer of Steps: 1 Entrance Stairs-Rails: None Home Layout: Two level Alternate Level Stairs-Number of Steps: flight Alternate Level Stairs-Rails: Right Bathroom Shower/Tub: Multimedia programmer: Standard Additional Comments: Information obtained from patient's chart due to patient with global aphasia and no family present at evaluation Vision Baseline Vision/History:  (potential R sided visual deficits but unable to confirm 2/2 aphasia and pt is nonverbal) Ability to See in Adequate Light: 0 Adequate (based on ADL performance) Vision Assessment?: Vision impaired- to be further tested in functional context;Yes (unclear 2/2 global aphasia, unable to perform testing) Eye Alignment: Within Functional Limits Ocular Range of Motion: Within Functional Limits Tracking/Visual Pursuits: Requires cues, head turns, or add eye shifts to track;Impaired - to be further tested in functional context Perception  Perception: Impaired Inattention/Neglect: Does not attend to right side of body Praxis Praxis: Impaired Praxis Impairment Details: Initiation;Motor planning Cognition Overall Cognitive Status: Impaired/Different from baseline Arousal/Alertness: Awake/alert Orientation Level: Nonverbal/unable to assess Year: Other (Comment) (nonverbal, unable to  assess) Month:  (nonverbal, unable to assess) Day of Week: Other (Comment) (nonverbal, unable to assess) Memory:  (nonverbal, unable to assess) Immediate Memory Recall:  (nonverbal, unable to assess) Memory Recall Sock:  (nonverbal, unable to assess) Memory Recall Blue:  (nonverbal, unable to assess) Memory Recall Bed:  (nonverbal, unable to assess) Attention: Focused;Sustained Focused Attention: Impaired Focused Attention Impairment: Functional basic Sustained Attention: Impaired Sustained Attention Impairment: Functional basic Awareness: Impaired Problem Solving: Impaired Problem Solving Impairment: Functional basic Safety/Judgment: Impaired Comments: Assessed with functional performance due to patient non-verbal at evaluation Sensation Sensation Light Touch: Impaired Detail Peripheral sensation comments: Pain and light touch with absent response on R, intact response on L  Proprioception: Appears Intact Coordination Gross Motor Movements are Fluid and Coordinated: No Fine Motor Movements are Fluid and Coordinated: No Coordination and Movement Description: impaired postural control with all functional mobility, mild intention tremor at times, decreased R body awareness and [facility] Finger Nose Finger Test: unable to follow commands to complete Heel Shin Test: Unable to follow verbal and visual cues Motor  Motor Motor: Abnormal postural alignment and control  Trunk/Postural Assessment  Cervical Assessment Cervical Assessment: Exceptions to [facility] Thoracic Assessment Thoracic Assessment: Exceptions to [facility] Lumbar Assessment Lumbar Assessment: Exceptions to [facility] Postural Control Postural Control: Deficits on evaluation  Balance Balance Balance Assessed: Yes Standardized Balance Assessment Standardized Balance Assessment: Berg Balance Test Berg Balance Test Sit to Stand: Needs minimal aid to stand or to stabilize Standing Unsupported: Unable to stand 30 seconds unassisted Sitting  with Back Unsupported but Feet Supported on Floor or Stool: Unable to sit without support 10 seconds Stand to Sit: Sits independently, has uncontrolled descent Transfers: Needs one person to assist Standing Unsupported with Eyes Closed: Needs help to keep from falling Standing Ubsupported with Feet Together: Needs help to attain position and unable to hold for 15 seconds From Standing, Reach Forward with Outstretched Arm: Loses balance while trying/requires external support From Standing Position, Pick up Object from Floor: Unable to try/needs assist to keep balance From Standing Position, Turn to Look Behind Over each Shoulder: Needs assist to keep from losing balance and falling Turn 360 Degrees: Needs assistance while turning Standing Unsupported, Alternately Place Feet on Step/Stool: Needs assistance to keep from falling or unable to try Standing Unsupported, One Foot in Front: Loses balance while stepping or standing Standing on One Leg: Unable to try or needs assist to prevent fall Total Score: 3 Static Sitting Balance Static Sitting - Balance Support: No upper extremity supported;Feet supported Static Sitting - Level of Assistance: 5: Stand by assistance Dynamic Sitting Balance Dynamic Sitting - Balance Support: Feet supported;During functional activity Dynamic Sitting - Level of Assistance: 4: Min assist Dynamic Sitting - Balance Activities: Lateral lean/weight shifting;Forward lean/weight shifting;Reaching for objects Static Standing Balance Static Standing - Balance Support: During functional activity;No upper extremity supported Static Standing - Level of Assistance: 4: Min assist Dynamic Standing Balance Dynamic Standing - Balance Support: During functional activity Dynamic Standing - Level of Assistance: 4: Min assist Dynamic Standing - Balance Activities: Forward lean/weight shifting;Reaching for objects Extremity/Trunk Assessment RUE Assessment RUE Assessment: Exceptions  to [facility] Active Range of Motion (AROM) Comments: WFL for ADL, but limited to approx 100* shoulder flexion General Strength Comments: decreased GMC/FMC, body awareness, and perception, mild R hemi RUE Body System: Neuro Brunstrum levels for arm and hand: Hand;Arm Brunstrum level for arm: Stage V Relative Independence from Synergy Brunstrum level for hand: Stage VI Isolated joint movements;Stage V Independence from basic synergies LUE Assessment LUE Assessment: Within Functional Limits General Strength Comments: difficult to assess but [facility] for ADL tasks  Care Tool Care Tool Self Care Eating   Eating Assist Level: Minimal Assistance - Patient > 75%    Oral Care    Oral Care Assist Level: Minimal Assistance - Patient > 75%    Bathing   Body parts bathed by patient: Right arm;Left arm;Chest;Abdomen;Face Body parts bathed by helper: Front perineal area;Buttocks;Right upper leg;Left upper leg;Right lower leg;Left lower leg   Assist Level: Maximal Assistance - Patient 24 - 49%    Upper Body Dressing(including orthotics)   What is the patient wearing?: Pull over shirt   Assist Level: Contact  Guard/Touching assist    Lower Body Dressing (excluding footwear)   What is the patient wearing?: Pants Assist for lower body dressing: Moderate Assistance - Patient 50 - 74%    Putting on/Taking off footwear   What is the patient wearing?: Non-skid slipper socks Assist for footwear: Dependent - Patient 0%       Care Tool Toileting Toileting activity     Mod A     Care Tool Bed Mobility Roll left and right activity   Roll left and right assist level: Minimal Assistance - Patient > 75%    Sit to lying activity   Sit to lying assist level: Supervision/Verbal cueing    Lying to sitting on side of bed activity   Lying to sitting on side of bed assist level: the ability to move from lying on the back to sitting on the side of the bed with no back support.: Minimal Assistance - Patient > 75%      Care Tool Transfers Sit to stand transfer   Sit to stand assist level: Minimal Assistance - Patient > 75%    Chair/bed transfer   Chair/bed transfer assist level: Minimal Assistance - Patient > 75%     Toilet transfer   Assist Level: Minimal Assistance - Patient > 75%     Care Tool Cognition  Expression of Ideas and Wants Expression of Ideas and Wants: 1. Rarely/Never expressess or very difficult - rarely/never expresses self or speech is very difficult to understand  Understanding Verbal and Non-Verbal Content Understanding Verbal and Non-Verbal Content: 2. Sometimes understands - understands only basic conversations or simple, direct phrases. Frequently requires cues to understand   Memory/Recall Ability Memory/Recall Ability : None of the above were recalled   Refer to Care Plan for Long Term Goals  SHORT TERM GOAL WEEK 1 OT Short Term Goal 1 (Week 1): Pt will perform self feeding with Supervision and compensatory techniques/AE PRN OT Short Term Goal 2 (Week 1): Pt will perform shower level bathing CGA OT Short Term Goal 3 (Week 1): Pt will perform 3/3 toileting tasks with CGA  Recommendations for other services: None    Skilled Therapeutic Intervention ADL ADL Eating: Minimal assistance Grooming: Moderate assistance Upper Body Bathing: Moderate assistance Lower Body Bathing: Maximal assistance Upper Body Dressing: Minimal assistance Lower Body Dressing: Moderate assistance Toileting: Not assessed Toilet Transfer: Minimal assistance Mobility  Bed Mobility Bed Mobility: Sitting - Scoot to Edge of Bed;Sit to Supine;Supine to Sit Supine to Sit: Minimal Assistance - Patient > 75% Sitting - Scoot to Edge of Bed: Minimal Assistance - Patient > 75% Sit to Supine: Minimal Assistance - Patient > 75% Transfers Sit to Stand: Minimal Assistance - Patient > 75% Stand to Sit: Minimal Assistance - Patient > 75%   Skilled Intervention: Pt greeted at time of session  semireclined bed level with NT assisting with feeding, OT resume care. Note no family present and patient nonverbal throughout session, unable to follow 1 step commands and inconsistently following one word commands. No s/s of pain. Pt nodding "yes" throughout session when inappropriate. Pt needing Max multimodal cues (primarily tactile and visual) throughout ADL). Pt performing bed mobility Min A, stand pivot HHA bed <> wheelchiar Min A. Set up at sink for UB/LB bathing needing Max/total A 2/2 unable to follow multimodal cues. Pt likely would perform better in shower environment. UB dress CGA, LB dress Min/Mod to thread and don over hips in standing. Self feeding with Min A, decreased distracting environment and  stimuli, seated upright in wheelchair with simple commands, able to feed self 4-5 bites with hand over hand fading to max cues. Pt in bed resting alarm on call bell in reach.   Discharge Criteria: Patient will be discharged from OT if patient refuses treatment 3 consecutive times without medical reason, if treatment goals not met, if there is a change in medical status, if patient makes no progress towards goals or if patient is discharged from hospital.  The above assessment, treatment plan, treatment alternatives and goals were discussed and mutually agreed upon: No family available/patient unable  Viona Gilmore 06/14/2021, 1:47 PM

## 2021-06-14 NOTE — Progress Notes (Signed)
Inpatient Rehabilitation Admission Medication Review by a Pharmacist  A complete drug regimen review was completed for this patient to identify any potential clinically significant medication issues.  High Risk Drug Classes Is patient taking? Indication by Medication  Antipsychotic Yes PRN compazine for n/v  Anticoagulant Yes Lovenox for DVT px  Antibiotic No   Opioid No   Antiplatelet Yes Asa - CVA  Hypoglycemics/insulin No   Vasoactive Medication No   Chemotherapy No   Other Yes Atorvastatin - CVA/HLD     Type of Medication Issue Identified Description of Issue Recommendation(s)  Drug Interaction(s) (clinically significant)     Duplicate Therapy     Allergy     No Medication Administration End Date     Incorrect Dose     Additional Drug Therapy Needed  Vitamin D, Toprol, claritin, multivitamin, probiotic, imitrex Discuss with Dr. Posey Pronto and we will only going to resume Toprol only.   Significant med changes from prior encounter (inform family/care partners about these prior to discharge).    Other       Clinically significant medication issues were identified that warrant physician communication and completion of prescribed/recommended actions by midnight of the next day:  Yes  Name of provider notified for urgent issues identified: Dr Posey Pronto  Provider Method of Notification: Phone    Pharmacist comments: Resume only Toprol since pt was on in prior to transfer. Hold others listed on DC summary especially Imitrex due to her hemorrhagic CVA.   Time spent performing this drug regimen review (minutes):  [address], PharmD, [place], AAHIVP, CPP Infectious Disease Pharmacist 06/14/2021 8:48 AM

## 2021-06-14 NOTE — Progress Notes (Signed)
Foley catheter removed per order. Tip intact, pt tolerated well. Pericare provided to pt. Family present

## 2021-06-14 NOTE — Progress Notes (Signed)
Occupational Therapy Session Note  Patient Details  Name: Mckenzie Guerrero MRN: [id number] Date of Birth: 1950/11/28  Today's Date: 06/15/2021 OT Individual Time: 1062-6948 OT Individual Time Calculation (min): 71 min   Short Term Goals: Week 1:  OT Short Term Goal 1 (Week 1): Pt will perform self feeding with Supervision and compensatory techniques/AE PRN OT Short Term Goal 2 (Week 1): Pt will perform shower level bathing CGA OT Short Term Goal 3 (Week 1): Pt will perform 3/3 toileting tasks with CGA  Skilled Therapeutic Interventions/Progress Updates:    Pt greeted in bed with no s/s pain, pt nonverbal and inconsistent with responses to yes/no questions due to global aphasia. Noted Lt posturing and Lt gaze. Tried to encourage midline orientation and Rt visual field scanning throughout ADL using multimodal cues. She first completed showering sit<stand using TTB, may find the padded 3:1 more comfortable to use as she would shift often and initiate standing. Had to cover her Rt hand IV which made it difficult for her to use the affected Rt hand, pt initiating use but dropping wash cloth multiple times, pt needing Mod A to use effectively. Pt did well to initiate using her Rt hand during dressing tasks afterwards. Before pulling up pants, noted that pt had BM smear on the towel. Had pt stand and brought a BSC behind her. Pt with small BM and able to assist therapist using B UEs with perihygiene. CGA-Min balance assistance at this time. Afterwards, we finished with LB dressing including brief, pants, and gripper socks. Her friend Mckenzie Guerrero arrived to visit. Discussed sitting on pts Rt side while conversing with her, to try to use yes/no questions and encourage Rt hand use. Hand over hand assistance for hand washing. Pt used both hands to untangle hair given Mod A. Mod A to use the hair-dryer using her affected Rt hand. Pt then completed short distance ambulatory transfer with CGA no AD from w/c parked near  baseboard of bed to her recliner chair. Placed recliner on Lt side of the room so that visitors could sit on the Rt and help with Rt attention. Retrieved safety belt as this was not in the room and set her up safely with alarm. Left her with call bell within reach. Tx focus placed on adaptive self care skills, following 1 step instruction (using mostly visual, demonstrational, and gestural cues), dynamic balance, sit<stands, and functional transfers.   Therapy Documentation Precautions:  Precautions Precautions: Fall Precaution Comments: Pt with recent R hernia repair R inattention, aphasic, impulsive Restrictions Weight Bearing Restrictions: No  Pain: pt grimaced at times though unable to determine where pain was, RN already informed Pain Assessment Pain Scale: Faces Faces Pain Scale: Hurts a little bit Pain Type: Acute pain Pain Location: Hip Pain Descriptors / Indicators: Other (Comment);Restless (difficult to tell; pt unable to communicate due to aphasia) Pain Onset: Unable to tell Pain Intervention(s): Medication (See eMAR);RN made aware;Repositioned PAINAD (Pain Assessment in Advanced Dementia) Breathing: normal Negative Vocalization: none Facial Expression: smiling or inexpressive Body Language: relaxed Consolability: no need to console PAINAD Score: 0 Critical Care Pain Observation Tool (CPOT) Facial Expression: Relaxed, neutral Body Movements: Restlessness Muscle Tension: Relaxed Compliance with ventilator (intubated pts.): N/A Vocalization (extubated pts.): Talking in normal tone or no sound CPOT Total: 2 ADL: ADL Eating: Minimal assistance Grooming: Moderate assistance Upper Body Bathing: Moderate assistance Lower Body Bathing: Maximal assistance Upper Body Dressing: Minimal assistance Lower Body Dressing: Moderate assistance Toileting: Not assessed Toilet Transfer: Minimal assistance  Therapy/Group:  Individual Therapy  Mckenzie Guerrero A Mckenzie Guerrero 06/15/2021, 12:37  PM

## 2021-06-14 NOTE — Progress Notes (Signed)
Pt given PRN sorbitol for mild constipation

## 2021-06-14 NOTE — Progress Notes (Addendum)
[place] PHYSICAL MEDICINE & REHABILITATION PROGRESS NOTE  Subjective/Complaints: Patient seen lying in bed this morning.  No reported issues overnight.  Discussed plans for DC and Foley with nursing.  Nursing reports dark-colored stool-Hemoccult ordered.  ROS: Unable to assess due to cognition.  Objective: Vital Signs: Blood pressure 107/65, pulse 79, temperature 98.3 F (36.8 C), resp. rate 16, height 5\' 2"  (1.575 m), weight 59.5 kg, SpO2 98 %. No results found. Recent Labs    06/12/21 0350 06/14/21 0726  WBC 14.8* 16.1*  HGB 12.0 12.2  HCT 35.9* 36.7  PLT 404* 412*   Recent Labs    06/12/21 0350  NA 135  K 3.6  CL 105  CO2 24  GLUCOSE 114*  BUN 18  CREATININE 0.69  CALCIUM 8.7*    Intake/Output Summary (Last 24 hours) at 06/14/2021 1628 Last data filed at 06/14/2021 0730 Gross per 24 hour  Intake 340 ml  Output 350 ml  Net -10 ml        Physical Exam: BP 107/65 (BP Location: Left Arm)   Pulse 79   Temp 98.3 F (36.8 C)   Resp 16   Ht 5\' 2"  (1.575 m)   Wt 59.5 kg   SpO2 98%   BMI 23.99 kg/m  Constitutional: No distress . Vital signs reviewed. HENT: Normocephalic.  Atraumatic. Eyes: EOMI. No discharge. Cardiovascular: No JVD.  Irregularly irregular Respiratory: Normal effort.  No stridor.  Bilateral clear to auscultation. GI: Non-distended.  BS +. Skin: Warm and dry.  Intact. Psych: Unable to assess due to mentation Musc: No edema in extremities.  No tenderness in extremities. Neuro: Alert Global aphasia Nods yes to all questions.  Unable to point or follow motor commands without tactile cues.  Right facial weakness.  Motor: Spontaneously moves all extremities, >/3/5 throughout, unchanged  Assessment/Plan: 1. Functional deficits which require 3+ hours per day of interdisciplinary therapy in a comprehensive inpatient rehab setting. Physiatrist is providing close team supervision and 24 hour management of active medical problems listed  below. Physiatrist and rehab team continue to assess barriers to discharge/monitor patient progress toward functional and medical goals   Care Tool:  Bathing  Bathing activity did not occur: Safety/medical concerns Body parts bathed by patient: Right arm, Left arm, Chest, Abdomen, Face   Body parts bathed by helper: Front perineal area, Buttocks, Right upper leg, Left upper leg, Right lower leg, Left lower leg     Bathing assist Assist Level: Maximal Assistance - Patient 24 - 49%     Upper Body Dressing/Undressing Upper body dressing   What is the patient wearing?: Pull over shirt    Upper body assist Assist Level: Contact Guard/Touching assist    Lower Body Dressing/Undressing Lower body dressing      What is the patient wearing?: Pants     Lower body assist Assist for lower body dressing: Moderate Assistance - Patient 50 - 74%     Toileting Toileting    Toileting assist       Transfers Chair/bed transfer  Transfers assist     Chair/bed transfer assist level: Minimal Assistance - Patient > 75%     Locomotion Ambulation   Ambulation assist   Ambulation activity did not occur: Safety/medical concerns  Assist level: Minimal Assistance - Patient > 75% Assistive device: Hand held assist Max distance: 40 ft   Walk 10 feet activity   Assist     Assist level: Minimal Assistance - Patient > 75% Assistive device: Hand held assist  Walk 50 feet activity   Assist Walk 50 feet with 2 turns activity did not occur: Safety/medical concerns  Assist level: Moderate Assistance - Patient - 50 - 74%      Walk 150 feet activity   Assist Walk 150 feet activity did not occur: Safety/medical concerns         Walk 10 feet on uneven surface  activity   Assist Walk 10 feet on uneven surfaces activity did not occur: Safety/medical concerns         Wheelchair     Assist Is the patient using a wheelchair?: Yes   Wheelchair activity did not  occur: Safety/medical concerns  Wheelchair assist level: Dependent - Patient 0% (patient unable to follow-mulimodal cues for w/c propulsion) Max wheelchair distance: >150 ft    Wheelchair 50 feet with 2 turns activity    Assist        Assist Level: Dependent - Patient 0%   Wheelchair 150 feet activity     Assist      Assist Level: Dependent - Patient 0%    Medical Problem List and Plan: 1. 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  Begin CIR evaluations 2.  Antithrombotics: -DVT/anticoagulation:  Pharmaceutical: Lovenox             -antiplatelet therapy:  ASA daily 3. Pain Management: Tylenol prn.  4. Mood: LCSW to follow for evaluation and support.              -antipsychotic agents: N/A 5. Neuropsych: This patient is not capable of making decisions on her own behalf. 6. Skin/Wound Care: Routine pressure relief measures.  7. Fluids/Electrolytes/Nutrition: Monitor I/Os.             CMP ordered 8. A fib: Will monitor BP TID--toprolol resumed after discussion with pharmacy             Monitor with increased activity 9. H/o Migraines: Was on sumatriptan PTA.               Monitor for increase in migraines, especially in association with vascular headaches 10. Leucocytosis: Monitor for fevers and other signs of infection.              WBC 16.1 on 12/4, continue to monitor  Afebrile  No signs/symptoms of infection  11. Urinary retention? 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  Dark-colored stools  Hemoccult ordered, CBC ordered for tomorrow  LOS: 1 days A FACE TO FACE EVALUATION WAS PERFORMED  Mckenzie Guerrero Lorie Phenix 06/14/2021, 4:28 PM

## 2021-06-14 NOTE — Plan of Care (Signed)
Problem: RH Balance Goal: LTG Patient will maintain dynamic standing with ADLs (OT) Description: LTG:  Patient will maintain dynamic standing balance with assist during activities of daily living (OT)  Flowsheets (Taken 06/14/2021 1357) LTG: Pt will maintain dynamic standing balance during ADLs with: Supervision/Verbal cueing   Problem: Sit to Stand Goal: LTG:  Patient will perform sit to stand in prep for activites of daily living with assistance level (OT) Description: LTG:  Patient will perform sit to stand in prep for activites of daily living with assistance level (OT) Flowsheets (Taken 06/14/2021 1357) LTG: PT will perform sit to stand in prep for activites of daily living with assistance level: Supervision/Verbal cueing   Problem: RH Eating Goal: LTG Patient will perform eating w/assist, cues/equip (OT) Description: LTG: Patient will perform eating with assist, with/without cues using equipment (OT) Flowsheets (Taken 06/14/2021 1357) LTG: Pt will perform eating with assistance level of: Supervision/Verbal cueing   Problem: RH Grooming Goal: LTG Patient will perform grooming w/assist,cues/equip (OT) Description: LTG: Patient will perform grooming with assist, with/without cues using equipment (OT) Flowsheets (Taken 06/14/2021 1357) LTG: Pt will perform grooming with assistance level of: Supervision/Verbal cueing   Problem: RH Bathing Goal: LTG Patient will bathe all body parts with assist levels (OT) Description: LTG: Patient will bathe all body parts with assist levels (OT) Flowsheets (Taken 06/14/2021 1357) LTG: Pt will perform bathing with assistance level/cueing: Supervision/Verbal cueing   Problem: RH Dressing Goal: LTG Patient will perform upper body dressing (OT) Description: LTG Patient will perform upper body dressing with assist, with/without cues (OT). Flowsheets (Taken 06/14/2021 1357) LTG: Pt will perform upper body dressing with assistance level of:  Supervision/Verbal cueing Goal: LTG Patient will perform lower body dressing w/assist (OT) Description: LTG: Patient will perform lower body dressing with assist, with/without cues in positioning using equipment (OT) Flowsheets (Taken 06/14/2021 1357) LTG: Pt will perform lower body dressing with assistance level of: Supervision/Verbal cueing   Problem: RH Toileting Goal: LTG Patient will perform toileting task (3/3 steps) with assistance level (OT) Description: LTG: Patient will perform toileting task (3/3 steps) with assistance level (OT)  Flowsheets (Taken 06/14/2021 1357) LTG: Pt will perform toileting task (3/3 steps) with assistance level: Supervision/Verbal cueing   Problem: RH Functional Use of Upper Extremity Goal: LTG Patient will use RT/LT upper extremity as a (OT) Description: LTG: Patient will use right/left upper extremity as a stabilizer/gross assist/diminished/nondominant/dominant level with assist, with/without cues during functional activity (OT) Flowsheets (Taken 06/14/2021 1357) LTG: Use of upper extremity in functional activities: RUE as dominant level LTG: Pt will use upper extremity in functional activity with assistance level of: Supervision/Verbal cueing   Problem: RH Toilet Transfers Goal: LTG Patient will perform toilet transfers w/assist (OT) Description: LTG: Patient will perform toilet transfers with assist, with/without cues using equipment (OT) Flowsheets (Taken 06/14/2021 1357) LTG: Pt will perform toilet transfers with assistance level of: Supervision/Verbal cueing   Problem: RH Tub/Shower Transfers Goal: LTG Patient will perform tub/shower transfers w/assist (OT) Description: LTG: Patient will perform tub/shower transfers with assist, with/without cues using equipment (OT) Flowsheets (Taken 06/14/2021 1357) LTG: Pt will perform tub/shower stall transfers with assistance level of: Supervision/Verbal cueing   Problem: RH Attention Goal: LTG Patient will  demonstrate this level of attention during functional activites (OT) Description: LTG:  Patient will demonstrate this level of attention during functional activites  (OT) Flowsheets (Taken 06/14/2021 1357) Patient will demonstrate this level of attention during functional activites: Sustained Patient will demonstrate above attention level in the  following environment: Controlled LTG: Patient will demonstrate this level of attention during functional activites (OT): Supervision

## 2021-06-14 NOTE — Evaluation (Signed)
Physical Therapy Assessment and Plan  Patient Details  Name: Mckenzie Guerrero MRN: [id number] Date of Birth: [birth year]  PT Diagnosis: Abnormal posture, Abnormality of gait, Cognitive deficits, Coordination disorder, Difficulty walking, Hemiparesis dominant, Hemiplegia dominant, Impaired cognition, Impaired sensation, and Muscle weakness Rehab Potential: Excellent ELOS: 12-14 days   Today's Date: 06/14/2021 PT Individual Time: 0855-1000 PT Individual Time Calculation (min): 65 min    Hospital Problem: Principal Problem:   Acute ischemic left middle cerebral artery (MCA) stroke ([place])   Past Medical History:  Past Medical History:  Diagnosis Date   Anxiety    Arthritis    osteoarthritis   Complication of anesthesia    GERD (gastroesophageal reflux disease)    Headache    migraines   History of bleeding ulcers 2010   History of hiatal hernia    Hypertension    Neuromuscular disorder ([place])    tremors   Occasional tremors    PONV (postoperative nausea and vomiting)    Past Surgical History:  Past Surgical History:  Procedure Laterality Date   ANTERIOR CERVICAL DECOMP/DISCECTOMY FUSION N/A 09/28/2019   Procedure: Anterior Cervical Discectomy Fusion - Cervical Four-Cervical Five- Cervical Five-Cervical Six;  Surgeon: Earnie Larsson, MD;  Location: [place];  Service: Neurosurgery;  Laterality: N/A;  Anterior Cervical Discectomy Fusion - Cervical Four-Cervical Five- Cervical Five-Cervical Six   benign tumor removal from left breast 2003  2003   BUNIONECTOMY Left    Great toe   COLONOSCOPY     ESOPHAGOGASTRODUODENOSCOPY N/A 05/25/2021   Procedure: ESOPHAGOGASTRODUODENOSCOPY (EGD);  Surgeon: Johnathan Hausen, MD;  Location: [facility];  Service: General;  Laterality: N/A;   XI ROBOTIC ASSISTED HIATAL HERNIA REPAIR N/A 05/25/2021   Procedure: XI ROBOTIC ASSISTED HIATAL HERNIA REPAIR WITH FUNDOPLICATION;  Surgeon: Johnathan Hausen, MD;  Location: [facility];  Service: General;  Laterality: N/A;     Assessment & Plan Clinical Impression: Patient is a 70 y.o. year old female with  history of HTN, migraines, anxiety, PUD, tremors, large HH repair 05/25/21  who was admitted on 06/08/2021 after found in recline with right hemiparesis, right facial weakness with difficulty speaking and left gaze preference.  History taken from chart review and family due to cognition.  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 She was out of window for tPA and not thrombectomy candidate due to completed stroke.  MRI brain showed evolving left MCA infarct with hemorrhagic transformation and trace rightward midline shift. She was kept NPO till swallow eval and has been advanced to D1 thin liquids. Follow up CT head 12/02 showed large left MCA infarct with hemorrhagic transformation and 3 mm shift to the right. A fib was captured by [facility] and she was started on low dose beta-blocker. Dr. Erlinda Hong recommends anticoagulation 10-15 days post stroke due to hemorrhage and if neurologically stable. Dr. Erlinda Hong felt that stroke was due to newly diagnosed atrial fibrillation. She continues on low dose ASA with repeat CT head on 12/2 stable. She continues to be limited by right hemiparesis with left gaze preference with right inattention, balance deficits and is able to follow simple one step commands inconsistently. CIR recommended due to functional decline.  Please see preadmission assessment from earlier today as well.  Patient transferred to CIR on 06/13/2021 .   Patient currently requires min with mobility secondary to muscle weakness, decreased cardiorespiratoy endurance, abnormal tone and decreased coordination, decreased visual motor  skills, decreased attention to right, decreased initiation, decreased attention, decreased awareness, decreased problem solving, decreased safety awareness, decreased memory, and delayed processing,  and decreased sitting balance, decreased standing balance, decreased postural control, hemiplegia, and decreased balance strategies.  Prior to hospitalization, patient was modified independent  with mobility and lived with   in a House home.  Home access is 1Stairs to enter.  Patient will benefit from skilled PT intervention to maximize safe functional mobility, minimize fall risk, and decrease caregiver burden for planned discharge home with 24 hour supervision.  Anticipate patient will benefit from follow up OP at discharge.  PT - End of Session Activity Tolerance: Tolerates 30+ min activity with multiple rests Endurance Deficit: Yes PT Assessment Rehab Potential (ACUTE/IP ONLY): Excellent PT Barriers to Discharge: [place] home environment;Home environment access/layout;Incontinence;Behavior PT Patient demonstrates impairments in the following area(s): Balance;Perception;Safety;Behavior;Edema;Sensory;Skin Integrity;Endurance;Motor;Nutrition;Pain PT Transfers Functional Problem(s): Bed Mobility;Bed to Chair;Car;Furniture PT Locomotion Functional Problem(s): Wheelchair Mobility;Stairs;Ambulation PT Plan PT Intensity: Minimum of 1-2 x/day ,45 to 90 minutes PT Frequency: 5 out of 7 days PT Duration Estimated Length of Stay: 12-14 days PT Treatment/Interventions: Ambulation/gait training;Cognitive remediation/compensation;Discharge planning;DME/adaptive equipment instruction;Functional mobility training;Pain management;Psychosocial support;Splinting/orthotics;Therapeutic Activities;UE/LE Strength taining/ROM;Visual/perceptual remediation/compensation;Wheelchair propulsion/positioning;UE/LE Coordination activities;Therapeutic Exercise;Stair training;Skin care/wound management;Patient/family education;Neuromuscular re-education;Functional electrical stimulation;Disease management/prevention;Community reintegration;Balance/vestibular training PT Transfers Anticipated Outcome(s): supervision using  LRAD PT Locomotion Anticipated Outcome(s): supervision using LRAD >150 ft PT Recommendation Follow Up Recommendations: Outpatient PT Patient destination: Home Equipment Recommended: To be determined   PT Evaluation Precautions/Restrictions Precautions Precautions: Fall Precaution Comments: Pt with recent R hernia repair R inattention, aphasic Restrictions Weight Bearing Restrictions: No Pain Interference Pain Interference Pain Effect on Sleep: 8. Unable to answer Pain Interference with Therapy Activities: 8. Unable to answer Pain Interference with Day-to-Day Activities: 8. Unable to answer Home Living/Prior Functioning Home Living Available Help at Discharge: Family Type of Home: House Home Access: Stairs to enter Technical brewer of Steps: 1 Entrance Stairs-Rails: None Home Layout: Two level Alternate Level Stairs-Number of Steps: flight Alternate Level Stairs-Rails: Right Bathroom Shower/Tub: Multimedia programmer: Standard Additional Comments: Information obtained from patient's chart due to patient with global aphasia and no family present at evaluation Vision/Perception  Vision - Assessment Eye Alignment: Within Functional Limits Ocular Range of Motion: Within Functional Limits Tracking/Visual Pursuits: Requires cues, head turns, or add eye shifts to track;Impaired - to be further tested in functional context Perception Perception: Impaired Inattention/Neglect: Does not attend to right side of body Praxis Praxis: Impaired Praxis Impairment Details: Initiation  Cognition Overall Cognitive Status: Impaired/Different from baseline Arousal/Alertness: Awake/alert Orientation Level: Oriented to person Attention: Focused;Sustained Focused Attention: Impaired Focused Attention Impairment: Functional basic Sustained Attention: Impaired Sustained Attention Impairment: Functional basic Problem Solving: Impaired Problem Solving Impairment: Functional  basic Safety/Judgment: Impaired Comments: Assessed with functional performance due to patient non-verbal at evaluation Sensation Sensation Light Touch: Impaired Detail Peripheral sensation comments: Pain and light touch with absent response on R, intact response on L Proprioception: Appears Intact Coordination Gross Motor Movements are Fluid and Coordinated: No Coordination and Movement Description: impaired postural control with all functional mobility Heel Shin Test: Unable to follow verbal and visual cues Motor  Motor Motor: Abnormal postural alignment and control   Trunk/Postural Assessment  Cervical Assessment Cervical Assessment: Exceptions to [facility] (forward head) Thoracic Assessment Thoracic Assessment: Exceptions to [facility] (kyphosis) Lumbar Assessment Lumbar Assessment: Exceptions to [facility] (posterior pelvic tilt) Postural Control Postural Control: Deficits on evaluation (decreased/delayed)  Balance Balance Balance Assessed: Yes Standardized Balance Assessment Standardized Balance Assessment: Merrilee Jansky  Balance Test Berg Balance Test Sit to Stand: Needs minimal aid to stand or to stabilize Standing Unsupported: Unable to stand 30 seconds unassisted Sitting with Back Unsupported but Feet Supported on Floor or Stool: Unable to sit without support 10 seconds Stand to Sit: Sits independently, has uncontrolled descent Transfers: Needs one person to assist Standing Unsupported with Eyes Closed: Needs help to keep from falling Standing Ubsupported with Feet Together: Needs help to attain position and unable to hold for 15 seconds From Standing, Reach Forward with Outstretched Arm: Loses balance while trying/requires external support From Standing Position, Pick up Object from Floor: Unable to try/needs assist to keep balance From Standing Position, Turn to Look Behind Over each Shoulder: Needs assist to keep from losing balance and falling Turn 360 Degrees: Needs assistance while  turning Standing Unsupported, Alternately Place Feet on Step/Stool: Needs assistance to keep from falling or unable to try Standing Unsupported, One Foot in Front: Loses balance while stepping or standing Standing on One Leg: Unable to try or needs assist to prevent fall Total Score: 3 Static Sitting Balance Static Sitting - Balance Support: No upper extremity supported;Feet supported Static Sitting - Level of Assistance: 5: Stand by assistance Dynamic Sitting Balance Dynamic Sitting - Balance Support: Feet supported;During functional activity Dynamic Sitting - Level of Assistance: 4: Min assist Static Standing Balance Static Standing - Balance Support: During functional activity;No upper extremity supported Static Standing - Level of Assistance: 4: Min assist Dynamic Standing Balance Dynamic Standing - Balance Support: During functional activity;Left upper extremity supported Dynamic Standing - Level of Assistance: 4: Min assist Extremity Assessment  RLE Assessment RLE Assessment: Exceptions to [facility] General Strength Comments: Grossly at least 3+/5 througout with functional mobility LLE Assessment LLE Assessment: Exceptions to [facility] General Strength Comments: Grossly at least 3+ to 4/5 throughout wilth functional mobility  Care Tool Care Tool Bed Mobility Roll left and right activity   Roll left and right assist level: Minimal Assistance - Patient > 75%    Sit to lying activity   Sit to lying assist level: Supervision/Verbal cueing    Lying to sitting on side of bed activity   Lying to sitting on side of bed assist level: the ability to move from lying on the back to sitting on the side of the bed with no back support.: Minimal Assistance - Patient > 75%     Care Tool Transfers Sit to stand transfer   Sit to stand assist level: Minimal Assistance - Patient > 75%    Chair/bed transfer   Chair/bed transfer assist level: Minimal Assistance - Patient > 75%     Toilet transfer    Assist Level: Minimal Assistance - Patient > 75%    Car transfer   Car transfer assist level: Minimal Assistance - Patient > 75%      Care Tool Locomotion Ambulation   Assist level: Minimal Assistance - Patient > 75% Assistive device: Hand held assist Max distance: 40 ft  Walk 10 feet activity   Assist level: Minimal Assistance - Patient > 75% Assistive device: Hand held assist   Walk 50 feet with 2 turns activity Walk 50 feet with 2 turns activity did not occur: Safety/medical concerns      Walk 150 feet activity Walk 150 feet activity did not occur: Safety/medical concerns      Walk 10 feet on uneven surfaces activity Walk 10 feet on uneven surfaces activity did not occur: Safety/medical concerns      Stairs  Assist level: Moderate Assistance - Patient - 50 - 74% Stairs assistive device: Other (comment) (HHA) Max number of stairs: 1  Walk up/down 1 step activity   Walk up/down 1 step (curb) assist level: Moderate Assistance - Patient - 50 - 74% Walk up/down 1 step or curb assistive device: Other (comment) (HHA)  Walk up/down 4 steps activity Walk up/down 4 steps activity did not occur: Safety/medical concerns      Walk up/down 12 steps activity Walk up/down 12 steps activity did not occur: Safety/medical concerns      Pick up small objects from floor Pick up small object from the floor (from standing position) activity did not occur: Safety/medical concerns (able to reach forward, but sat to pick up object)      Wheelchair Is the patient using a wheelchair?: Yes     Wheelchair assist level: Dependent - Patient 0% (patient unable to follow-mulimodal cues for w/c propulsion) Max wheelchair distance: >150 ft  Wheel 50 feet with 2 turns activity   Assist Level: Dependent - Patient 0%  Wheel 150 feet activity   Assist Level: Dependent - Patient 0%    Refer to Care Plan for Long Term Goals  SHORT TERM GOAL WEEK 1 PT Short Term Goal 1 (Week 1): Patient will perform  basic transfers with CGA using LRAD. PT Short Term Goal 2 (Week 1): Patient will ambulate >150 ft using LRAD with min A. PT Short Term Goal 3 (Week 1): Patient will improved Berg Balance Scale by MCID (8 points) PT Short Term Goal 4 (Week 1): Patient will ascend/descend 12 step with min A using 1 rail to simulate home set-up.  Recommendations for other services: None   Skilled Therapeutic Intervention Evaluation completed (see details above and below) with education on PT POC and goals and individual treatment initiated with focus on functional mobility/transfers, LE strength, dynamic standing balance/coordination, ambulation, stair navigation, simulated car transfers, and improved endurance with activity Patient provided with 18"x16" wheelchair with standard cushion and adjustments made to promote optimal seating posture and pressure distribution. Patient also provided with RW for use in room and therapist adjusted to proper height for patient.  Patient in bed asleep with her legs handing off the bed upon PT arrival. Patient easily aroused and legs returned to the bed with min A for safety and agreeable to PT session. Patient unable to report pain due to global aphasia, patient would intermittent hold her head during session, unsure if due to pain vs fatigue, RN made aware.  Patient non-verbal throughout session. Followed simple one-step verbal cues inconsistently, and demonstration cues 100% of the time.   Therapeutic Activity: Bed Mobility: Patient performed rolling R/L with min A-supervision and supine to sit with min A for trunk control and sit to supine with supervision in a flat bed without use of bed rails.  Transfers: Patient performed stand pivot bed<>w/c with min A HHA and sit to/from stand from w/c x3, standard arm chair x1, and toilet with grab bar x1 with min A HHA. Provided verbal cues for initiation, forward weight shift and hand placement, as patient tends to reach for other objects  instead of therapist's hand. Patient was incontinent of bowl during session, indicated by shifting off bottom in sitting. Performed peri-care and lower body clothing management with total A during toileting. Patient performed a simulated sedan height car transfer with min A using HHA. Provided cues for safe technique.  Gait Training:  Patient ambulated 10-15 feet x3 using HHA with min  A. Ambulated with narrow BOS, decreased gait speed, step height, and step length, decreased R attention, decreased R weight shift, and increased R lean intermittently. Provided multimodal cues for erect posture, increased BOS, and R attention. Patient ascended/descended 1x8" step using HHA with min-mod A. Performed step-to gait pattern leading with L. Provided cues for technique and sequencing.  6 Min Walk Test:  Instructed patient to ambulate as quickly and as safely as possible for 6 minutes using LRAD. Patient was allowed to take standing rest breaks without stopping the test, but if the patient required a sitting rest break the clock would be stopped and the test would be over.  Results: 40 feet (12.2 meters, Avg speed 0.5ms). Patient initiated sitting rest break at 1 min 21 sec. Results indicate that the patient has reduced endurance with ambulation compared to age matched norms ((70-79y.o. females = 471 meters).   Wheelchair Mobility:  Provided verbal cues, demonstration, and hand-over-hand assist to initiate w/c mobility without initiation from patient. Patient dependent for w/c transport throughout session.   Instructed pt in results of PT evaluation as detailed above, PT POC, rehab potential, rehab goals, and discharge recommendations. Additionally discussed CIR's policies regarding fall safety and use of chair alarm and/or quick release belt. Pt verbalized understanding and in agreement. Will update pt's family members as they become available.   Patient in encouraged to sit up, however, self initiated  transfer to bed with min A. Patient lying in bed at end of session with breaks locked, bed alarm set to moderate setting due to legs off bed at beginning of session, and all needs within reach.   Discharge Criteria: Patient will be discharged from PT if patient refuses treatment 3 consecutive times without medical reason, if treatment goals not met, if there is a change in medical status, if patient makes no progress towards goals or if patient is discharged from hospital.  The above assessment, treatment plan, treatment alternatives and goals were discussed and mutually agreed upon: by patient  Sedonia Kitner L Hines Kloss PT, DPT  06/14/2021, 11:45 AM

## 2021-06-15 DIAGNOSIS — I63512 Cerebral infarction due to unspecified occlusion or stenosis of left middle cerebral artery: Secondary | ICD-10-CM | POA: Diagnosis not present

## 2021-06-15 LAB — GLUCOSE, CAPILLARY
Glucose-Capillary: 203 mg/dL — ABNORMAL HIGH (ref 70–99)
Glucose-Capillary: 119 mg/dL — ABNORMAL HIGH (ref 70–99)

## 2021-06-15 MED ORDER — B COMPLEX-C PO TABS
1.0000 | ORAL_TABLET | Freq: Every day | ORAL | Status: DC
  Administered 2021-06-15 – 2021-06-30 (×14): 1 via ORAL
  Filled 2021-06-15 (×16): qty 1

## 2021-06-15 MED ORDER — DOCUSATE SODIUM 50 MG/5ML PO LIQD
100.0000 mg | Freq: Every day | ORAL | Status: DC
  Administered 2021-06-15 – 2021-06-24 (×9): 100 mg
  Filled 2021-06-15 (×10): qty 10

## 2021-06-15 MED ORDER — DOCUSATE SODIUM 100 MG PO CAPS
100.0000 mg | ORAL_CAPSULE | Freq: Every day | ORAL | Status: DC
  Filled 2021-06-15: qty 1

## 2021-06-15 MED ORDER — ENSURE ENLIVE PO LIQD
237.0000 mL | Freq: Two times a day (BID) | ORAL | Status: DC
  Administered 2021-06-15 – 2021-06-30 (×30): 237 mL via ORAL

## 2021-06-15 MED ORDER — MEGESTROL ACETATE 400 MG/10ML PO SUSP
400.0000 mg | Freq: Every day | ORAL | Status: DC
  Administered 2021-06-15 – 2021-06-30 (×15): 400 mg via ORAL
  Filled 2021-06-15 (×16): qty 10

## 2021-06-15 NOTE — Progress Notes (Signed)
Patient ID: [name], female   DOB: 1951-01-24, 70 y.o.   MRN: [id number] Met with the patient and spouse to introduce self and role. Reviewed rehab routine, team conference and plan of care. Nurse noted poor appetite, MD addressed. Reviewed diet with the patient and spouse, medication added and dietary supplements. Foley out and patient voiding; low residuals noted, I+O cath prn. Monitoring stools for occult blood. Reviewed DOAC; Dr. Erlinda Hong recommends anticoagulation 10-15 days post stroke due to hemorrhage and if neurologically stable. Encouraging patient to get OOB to void. Activity limited by pain, K-pad ordered; taking tylenol prn. Reviewed secondary risks including HTN and HLD (LDL 139). Provided handouts on eating after a stroke and prediabetes. Continue to follow along to discharge to address questions and educational needs to facilitate preparation for discharge home with spouse. Margarito Liner

## 2021-06-15 NOTE — Plan of Care (Signed)
  Problem: RH BLADDER ELIMINATION Goal: RH STG MANAGE BLADDER WITH ASSISTANCE Description: STG Manage Bladder With mod I Assistance Outcome: Not Progressing; I and O cath   Problem: RH PAIN MANAGEMENT Goal: RH STG PAIN MANAGED AT OR BELOW PT'S PAIN GOAL Description: At or below level 4 w prns Outcome: Not Progressing; unable to express   Problem: RH KNOWLEDGE DEFICIT Goal: RH STG INCREASE KNOWLEDGE OF DIABETES Description: Patient will be able to manage DM with dietary modifications using handouts and educational resources independently Outcome: Not Progressing; aphasic   Problem: RH KNOWLEDGE DEFICIT Goal: RH STG INCREASE KNOWLEDGE OF HYPERTENSION Description: Patient will be able to manage HTN with medications and dietary modifications using handouts and educational resources independently Outcome: Not Progressing; aphasic

## 2021-06-15 NOTE — Progress Notes (Signed)
Inpatient [facility] Individual Statement of Services  Patient Name:  Mckenzie Guerrero  Date:  06/15/2021  Welcome to the [facility].  Our goal is to provide you with an individualized program based on your diagnosis and situation, designed to meet your specific needs.  With this comprehensive rehabilitation program, you will be expected to participate in at least 3 hours of rehabilitation therapies Monday-Friday, with modified therapy programming on the weekends.  Your rehabilitation program will include the following services:  Physical Therapy (PT), Occupational Therapy (OT), Speech Therapy (ST), 24 hour per day rehabilitation nursing, Care Coordinator, Rehabilitation Medicine, Nutrition Services, and Pharmacy Services  Weekly team conferences will be held on Wednesday to discuss your progress.  Your Inpatient Rehabilitation Care Coordinator will talk with you frequently to get your input and to update you on team discussions.  Team conferences with you and your family in attendance may also be held.  Expected length of stay: 12-14 days  Overall anticipated outcome: Supervision with cues  Depending on your progress and recovery, your program may change. Your Inpatient Rehabilitation Care Coordinator will coordinate services and will keep you informed of any changes. Your Inpatient Rehabilitation Care Coordinator's name and contact numbers are listed  below.  The following services may also be recommended but are not provided by the [facility] will be made to provide these services after discharge if needed.  Arrangements include referral to agencies that provide these services.  Your insurance has been verified to be:  [employer] Your primary doctor is:  Gilford Rile  Pertinent information will be shared with your  doctor and your insurance company.  Inpatient Rehabilitation Care Coordinator:  Ovidio Kin, [place] or Emilia Beck  Information discussed with and copy given to patient by: Elease Hashimoto, 06/15/2021, 11:48 AM

## 2021-06-15 NOTE — Progress Notes (Signed)
Inpatient Rehabilitation  Patient information reviewed and entered into eRehab system by Cayli Escajeda Dashiel Bergquist, OTR/L.   Information including medical coding, functional ability and quality indicators will be reviewed and updated through discharge.    

## 2021-06-15 NOTE — Progress Notes (Signed)
Inpatient Rehabilitation Care Coordinator Assessment and Plan Patient Details  Name: LIZBETH FEIJOO MRN: [id number] Date of Birth: 16-Jan-1951  Today's Date: 06/15/2021  Hospital Problems: Principal Problem:   Acute ischemic left middle cerebral artery (MCA) stroke (HCC) Active Problems:   Dysphagia, post-stroke  Past Medical History:  Past Medical History:  Diagnosis Date   Anxiety    Arthritis    osteoarthritis   Complication of anesthesia    GERD (gastroesophageal reflux disease)    Headache    migraines   History of bleeding ulcers 2010   History of hiatal hernia    Hypertension    Neuromuscular disorder (HCC)    tremors   Occasional tremors    PONV (postoperative nausea and vomiting)    Past Surgical History:  Past Surgical History:  Procedure Laterality Date   ANTERIOR CERVICAL DECOMP/DISCECTOMY FUSION N/A 09/28/2019   Procedure: Anterior Cervical Discectomy Fusion - Cervical Four-Cervical Five- Cervical Five-Cervical Six;  Surgeon: Earnie Larsson, MD;  Location: [facility];  Service: Neurosurgery;  Laterality: N/A;  Anterior Cervical Discectomy Fusion - Cervical Four-Cervical Five- Cervical Five-Cervical Six   benign tumor removal from left breast 2003  2003   BUNIONECTOMY Left    Great toe   COLONOSCOPY     ESOPHAGOGASTRODUODENOSCOPY N/A 05/25/2021   Procedure: ESOPHAGOGASTRODUODENOSCOPY (EGD);  Surgeon: Johnathan Hausen, MD;  Location: [facility];  Service: General;  Laterality: N/A;   XI ROBOTIC ASSISTED HIATAL HERNIA REPAIR N/A 05/25/2021   Procedure: XI ROBOTIC ASSISTED HIATAL HERNIA REPAIR WITH FUNDOPLICATION;  Surgeon: Johnathan Hausen, MD;  Location: [facility];  Service: General;  Laterality: N/A;   Social History:  reports that she has quit smoking. She has never used smokeless tobacco. She reports that she does not drink alcohol and does not use drugs.  Family / Support Systems Marital Status: Divorced Patient Roles: Parent, Other (Comment) (Friend) ChildrenNetta Cedars  [phone]  Don-son [phone] Miguel Dibble [phone] Other Supports: Doran Durand [phone] Anticipated Caregiver: Sons and their wifes Ability/Limitations of Caregiver: Son's to work on 24/7 care for discharge Caregiver Availability: Other (Comment) (Aware pt will need 24/7 care) Family Dynamics: Close knit with her three son's and their wives. She has always been very independent and taken care of herself. Her long time friend-jacob is here and confirms this information  Social History Preferred language: English Religion:  Cultural Background: No issues Education: [place] - How often do you need to have someone help you when you read instructions, pamphlets, or other written material from your doctor or pharmacy?: Never Writes: Yes Employment Status: Retired Public relations account executive Issues: No issues Guardian/Conservator: None-according to MD pt is not fully capable of making her own decisions while here. Have reached out to John to see if any one of them has HCPOA will await return call   Abuse/Neglect Abuse/Neglect Assessment Can Be Completed: Yes Physical Abuse: Denies Verbal Abuse: Denies Sexual Abuse: Denies Exploitation of patient/patient's resources: Denies Self-Neglect: Denies  Patient response to: Social Isolation - How often do you feel lonely or isolated from those around you?: Sometimes  Emotional Status Pt's affect, behavior and adjustment status: Pt is motivated to do well and regain her independence she is not one to sit still according to friend. She lives alone and was driving PTA and this stroke. She is improving and making progress Recent Psychosocial Issues: other health issues Psychiatric History: history of anxiety not taking any meds for may need some with frustration with aphasia since CVA Substance Abuse History: No issues  Patient / Family Perceptions, Expectations & Goals Pt/Family understanding of illness & functional limitations: Pt seems to  understand the reason she is here and friend can explain she has had a stroke. Will await son returning call to see his understanding. Premorbid pt/family roles/activities: Mom, grandmother, retiree, home owner, friend Anticipated changes in roles/activities/participation: resume Pt/family expectations/goals: Pt nods and does say yes and no. Friend states: " I hope she does well she is not one to sit still she likes to be moving."  [employer]: None Premorbid Home Care/DME Agencies: None Transportation available at discharge: Pt drove PTA Is the patient able to respond to transportation needs?: Yes In the past 12 months, has lack of transportation kept you from medical appointments or from getting medications?: No In the past 12 months, has lack of transportation kept you from meetings, work, or from getting things needed for daily living?: No  Discharge Planning Living Arrangements: Alone Support Systems: Children, Other relatives, Friends/neighbors, Social worker community Type of Residence: Private residence Insurance Resources: Multimedia programmer (specify) (Health Team Adv) Financial Resources: Social Security Financial Screen Referred: No Living Expenses: Own Money Management: Patient Does the patient have any problems obtaining your medications?: No Home Management: self Patient/Family Preliminary Plans: Family needs to come up with a plan for discharge, pt will need 24/7 supervision due to speech deficits and for safety. Awaiting John return call to discuss. Will have team conference on Wed and will have target DC date. Care Coordinator Barriers to Discharge: Insurance for SNF coverage Care Coordinator Anticipated Follow Up Needs: HH/OP  Clinical Impression Pleasant female who can say yes and no, who has been fully independent prior to admission. Her long time friend is here and confirms pt's level of independence. Awaiting son's return call to discuss  discharge plan and will update once team conference on Wednesday  Elease Hashimoto 06/15/2021, 11:46 AM

## 2021-06-15 NOTE — Progress Notes (Signed)
Physical Therapy Session Note  Patient Details  Name: Mckenzie Guerrero MRN: [id number] Date of Birth: 1950-12-31  Today's Date: 06/15/2021 PT Individual Time: 1345-1430 PT Individual Time Calculation (min): 45 min   Short Term Goals: Week 1:  PT Short Term Goal 1 (Week 1): Patient will perform basic transfers with CGA using LRAD. PT Short Term Goal 2 (Week 1): Patient will ambulate >150 ft using LRAD with min A. PT Short Term Goal 3 (Week 1): Patient will improved Berg Balance Scale by MCID (8 points) PT Short Term Goal 4 (Week 1): Patient will ascend/descend 12 step with min A using 1 rail to simulate home set-up.  Skilled Therapeutic Interventions/Progress Updates:    Pt in bed with [name] present on arrival. Pt nodded when asked if she was agreeable to participate. Supine<>sit with supervision. Stand pivot transfer to w/c with HHA. Pt ambulated 3x~40 ft with HHA and hall rail. When fatigued, pt demoed increasingly narrow BOS and poor coordination. Attempted standing task in day room, but pt refused. Agreeable to walk back to room in the same manner. Pt appeared to be in discomfort and responded affirmatively. Pt nodded when asked if she had to go to the bathroom. Ambulated to bathroom with HHA, no void but small smear noted in brief. Therapist assisted with hygiene, clothing management with CGA. Pt returned to bed and nodded when asked if she was in pain, RN made aware. Pt remained in bed with her SO present and was left with all needs in reach and alarm active.   Therapy Documentation Precautions:  Precautions Precautions: Fall Precaution Comments: Pt with recent R hernia repair R inattention, aphasic, impulsive Restrictions Weight Bearing Restrictions: No    Therapy/Group: Individual Therapy  Mickel Fuchs 06/15/2021, 4:26 PM

## 2021-06-15 NOTE — Progress Notes (Signed)
[facility] PHYSICAL MEDICINE & REHABILITATION PROGRESS NOTE  Subjective/Complaints: Mckenzie Guerrero told me she has not been eating much- did tolerate Ensure. Megace started.  IV may be d/ced today +low back pain  ROS: Decreased appetite as per nursing  Objective: Vital Signs: Blood pressure 124/71, pulse 79, temperature 98.2 F (36.8 C), resp. rate 14, height 5\' 2"  (1.575 m), weight 59.5 kg, SpO2 97 %. No results found. Recent Labs    06/14/21 0726  WBC 16.1*  HGB 12.2  HCT 36.7  PLT 412*   Recent Labs    06/14/21 0726  NA 138  K 3.7  CL 105  CO2 23  GLUCOSE 111*  BUN 24*  CREATININE 0.69  CALCIUM 8.7*    Intake/Output Summary (Last 24 hours) at 06/15/2021 1454 Last data filed at 06/15/2021 1236 Gross per 24 hour  Intake 338 ml  Output 1000 ml  Net -662 ml        Physical Exam: BP 124/71 (BP Location: Left Arm)   Pulse 79   Temp 98.2 F (36.8 C)   Resp 14   Ht 5\' 2"  (1.575 m)   Wt 59.5 kg   SpO2 97%   BMI 23.99 kg/m  Constitutional: No distress . Vital signs reviewed. Husband at bedside, BMI 23.99 HENT: Normocephalic.  Atraumatic. Eyes: EOMI. No discharge. Cardiovascular: No JVD.  Irregularly irregular Respiratory: Normal effort.  No stridor.  Bilateral clear to auscultation. GI: Non-distended.  BS +. Skin: Warm and dry.  Intact. Psych: Unable to assess due to mentation Musc: No edema in extremities.  No tenderness in extremities. Neuro: Alert Global aphasia Nods yes to all questions.  Unable to point or follow motor commands without tactile cues.  Right facial weakness.  Motor: Spontaneously moves all extremities, >/3/5 throughout, unchanged  Assessment/Plan: 1. Functional deficits which require 3+ hours per day of interdisciplinary therapy in a comprehensive inpatient rehab setting. Physiatrist is providing close team supervision and 24 hour management of active medical problems listed below. Physiatrist and rehab team continue to assess barriers  to discharge/monitor patient progress toward functional and medical goals   Care Tool:  Bathing  Bathing activity did not occur: Safety/medical concerns Body parts bathed by patient: Right arm, Left arm, Chest, Abdomen, Face, Right upper leg, Left upper leg   Body parts bathed by helper: Front perineal area, Buttocks, Right lower leg, Left lower leg     Bathing assist Assist Level: Maximal Assistance - Patient 24 - 49%     Upper Body Dressing/Undressing Upper body dressing   What is the patient wearing?: Pull over shirt    Upper body assist Assist Level: Supervision/Verbal cueing    Lower Body Dressing/Undressing Lower body dressing      What is the patient wearing?: Pants, Incontinence brief     Lower body assist Assist for lower body dressing: Moderate Assistance - Patient 50 - 74%     Toileting Toileting    Toileting assist Assist for toileting: Maximal Assistance - Patient 25 - 49%     Transfers Chair/bed transfer  Transfers assist     Chair/bed transfer assist level: Minimal Assistance - Patient > 75%     Locomotion Ambulation   Ambulation assist   Ambulation activity did not occur: Safety/medical concerns  Assist level: Minimal Assistance - Patient > 75% Assistive device: Hand held assist Max distance: 40 ft   Walk 10 feet activity   Assist     Assist level: Minimal Assistance - Patient > 75% Assistive device:  Hand held assist   Walk 50 feet activity   Assist Walk 50 feet with 2 turns activity did not occur: Safety/medical concerns  Assist level: Moderate Assistance - Patient - 50 - 74%      Walk 150 feet activity   Assist Walk 150 feet activity did not occur: Safety/medical concerns         Walk 10 feet on uneven surface  activity   Assist Walk 10 feet on uneven surfaces activity did not occur: Safety/medical concerns         Wheelchair     Assist Is the patient using a wheelchair?: Yes   Wheelchair activity  did not occur: Safety/medical concerns  Wheelchair assist level: Dependent - Patient 0% (patient unable to follow-mulimodal cues for w/c propulsion) Max wheelchair distance: >150 ft    Wheelchair 50 feet with 2 turns activity    Assist        Assist Level: Dependent - Patient 0%   Wheelchair 150 feet activity     Assist      Assist Level: Dependent - Patient 0%    Medical Problem List and Plan: 1. 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  B/C vitamins added to aid stroke recovery 2.  Impaired mobility: 70-90 feet: continue Lovenox             -antiplatelet therapy:  ASA daily 3. Low back pain: kpad added. Tylenol prn.  4. Mood: LCSW to follow for evaluation and support.              -antipsychotic agents: N/A 5. Neuropsych: This patient is not capable of making decisions on her own behalf. 6. Skin/Wound Care: Routine pressure relief measures.  7. Fluids/Electrolytes/Nutrition: Monitor I/Os.             CMP ordered 8. A fib: Will monitor BP TID--toprolol resumed after discussion with pharmacy             Monitor with increased activity 9. H/o Migraines: Was on sumatriptan PTA.               Monitor for increase in migraines, especially in association with vascular headaches 10. Leucocytosis: Monitor for fevers and other signs of infection.              WBC 16.1 on 12/4, continue to monitor  Afebrile  No signs/symptoms of infection  11. Urinary retention? 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  Dark-colored stools  Hemoccult ordered, CBC ordered for tomorrow 14. Decreased appetite: megace started.   LOS: 2 days A FACE TO FACE EVALUATION WAS PERFORMED  [name] 06/15/2021, 2:54 PM

## 2021-06-16 DIAGNOSIS — I63512 Cerebral infarction due to unspecified occlusion or stenosis of left middle cerebral artery: Secondary | ICD-10-CM | POA: Diagnosis not present

## 2021-06-16 LAB — CBC WITH DIFFERENTIAL/PLATELET
Abs Immature Granulocytes: 0.04 10*3/uL (ref 0.00–0.07)
Basophils Absolute: 0.1 10*3/uL (ref 0.0–0.1)
Basophils Relative: 1 %
Eosinophils Absolute: 0.3 10*3/uL (ref 0.0–0.5)
Eosinophils Relative: 3 %
HCT: 36.8 % (ref 36.0–46.0)
Hemoglobin: 12.1 g/dL (ref 12.0–15.0)
Immature Granulocytes: 0 %
Lymphocytes Relative: 24 %
Lymphs Abs: 3.1 10*3/uL (ref 0.7–4.0)
MCH: 30.3 pg (ref 26.0–34.0)
MCHC: 32.9 g/dL (ref 30.0–36.0)
MCV: 92 fL (ref 80.0–100.0)
Monocytes Absolute: 1.2 10*3/uL — ABNORMAL HIGH (ref 0.1–1.0)
Monocytes Relative: 9 %
Neutro Abs: 8.4 10*3/uL — ABNORMAL HIGH (ref 1.7–7.7)
Neutrophils Relative %: 63 %
Platelets: 444 10*3/uL — ABNORMAL HIGH (ref 150–400)
RBC: 4 MIL/uL (ref 3.87–5.11)
RDW: 12.3 % (ref 11.5–15.5)
WBC: 13.2 10*3/uL — ABNORMAL HIGH (ref 4.0–10.5)
nRBC: 0 % (ref 0.0–0.2)

## 2021-06-16 LAB — VITAMIN D 25 HYDROXY (VIT D DEFICIENCY, FRACTURES): Vit D, 25-Hydroxy: 69.9 ng/mL (ref 30–100)

## 2021-06-16 LAB — MAGNESIUM: Magnesium: 2.2 mg/dL (ref 1.7–2.4)

## 2021-06-16 MED ORDER — GERHARDT'S BUTT CREAM
TOPICAL_CREAM | Freq: Three times a day (TID) | CUTANEOUS | Status: DC
  Filled 2021-06-16: qty 1

## 2021-06-16 MED ORDER — ACETAMINOPHEN 325 MG PO TABS
650.0000 mg | ORAL_TABLET | Freq: Three times a day (TID) | ORAL | Status: DC
  Administered 2021-06-16 – 2021-06-30 (×40): 650 mg via ORAL
  Filled 2021-06-16 (×43): qty 2

## 2021-06-16 NOTE — Progress Notes (Addendum)
PT called RN to room re: patient keeps on holding her bottom interfering with therapy. Noted MASD on her peri area. Cleaned and placed barrier cream. Pam PA notified notified new orders noted. Patient noted to have fecal impaction as well. Pam notified. New orders noted.

## 2021-06-16 NOTE — Progress Notes (Signed)
[facility] [facility] PHYSICAL MEDICINE & REHABILITATION PROGRESS NOTE  Subjective/Complaints: Sleepy today WBC down Pain limited her OT session. LFTs reviewed and normal. Tylenol scheduled TID  ROS: Decreased appetite as per nursing, +back pain  Objective: Vital Signs: Blood pressure 109/66, pulse 84, temperature 98.4 F (36.9 C), temperature source Oral, resp. rate 18, height 5\' 2"  (1.575 m), weight 59.5 kg, SpO2 99 %. No results found. Recent Labs    06/14/21 0726 06/16/21 0514  WBC 16.1* 13.2*  HGB 12.2 12.1  HCT 36.7 36.8  PLT 412* 444*   Recent Labs    06/14/21 0726  NA 138  K 3.7  CL 105  CO2 23  GLUCOSE 111*  BUN 24*  CREATININE 0.69  CALCIUM 8.7*    Intake/Output Summary (Last 24 hours) at 06/16/2021 1341 Last data filed at 06/16/2021 1300 Gross per 24 hour  Intake 250 ml  Output 1210 ml  Net -960 ml        Physical Exam: BP 109/66 (BP Location: Left Arm)   Pulse 84   Temp 98.4 F (36.9 C) (Oral)   Resp 18   Ht 5\' 2"  (1.575 m)   Wt 59.5 kg   SpO2 99%   BMI 23.99 kg/m  Constitutional: No distress . Vital signs reviewed. Husband at bedside, BMI 23.99 HENT: Normocephalic.  Atraumatic. Eyes: EOMI. No discharge. Cardiovascular: No JVD.  Irregularly irregular Respiratory: Normal effort.  No stridor.  Bilateral clear to auscultation. GI: Non-distended.  BS +. Skin: Warm and dry.  Intact. Psych: Unable to assess due to mentation Musc: No edema in extremities.  No tenderness in extremities. Tenderness to palpation of lower back Neuro: Alert Global aphasia Nods yes to all questions.  Unable to point or follow motor commands without tactile cues.  Right facial weakness.  Motor: Spontaneously moves all extremities, >/3/5 throughout, unchanged  Assessment/Plan: 1. Functional deficits which require 3+ hours per day of interdisciplinary therapy in a comprehensive inpatient rehab setting. Physiatrist is providing close team supervision and 24 hour management of  active medical problems listed below. Physiatrist and rehab team continue to assess barriers to discharge/monitor patient progress toward functional and medical goals   Care Tool:  Bathing    Body parts bathed by patient: Right arm, Left arm, Chest, Abdomen, Face, Right upper leg, Left upper leg   Body parts bathed by helper: Front perineal area, Buttocks, Right lower leg, Left lower leg     Bathing assist Assist Level: Maximal Assistance - Patient 24 - 49%     Upper Body Dressing/Undressing Upper body dressing   What is the patient wearing?: Pull over shirt    Upper body assist Assist Level: Supervision/Verbal cueing    Lower Body Dressing/Undressing Lower body dressing      What is the patient wearing?: Pants, Incontinence brief     Lower body assist Assist for lower body dressing: Moderate Assistance - Patient 50 - 74%     Toileting Toileting    Toileting assist Assist for toileting: Maximal Assistance - Patient 25 - 49%     Transfers Chair/bed transfer  Transfers assist     Chair/bed transfer assist level: Minimal Assistance - Patient > 75%     Locomotion Ambulation   Ambulation assist   Ambulation activity did not occur: Safety/medical concerns  Assist level: Minimal Assistance - Patient > 75% Assistive device: Hand held assist Max distance: 40 ft   Walk 10 feet activity   Assist     Assist level: Minimal Assistance -  Patient > 75% Assistive device: Hand held assist   Walk 50 feet activity   Assist Walk 50 feet with 2 turns activity did not occur: Safety/medical concerns  Assist level: Moderate Assistance - Patient - 50 - 74%      Walk 150 feet activity   Assist Walk 150 feet activity did not occur: Safety/medical concerns         Walk 10 feet on uneven surface  activity   Assist Walk 10 feet on uneven surfaces activity did not occur: Safety/medical concerns         Wheelchair     Assist Is the patient using a  wheelchair?: Yes   Wheelchair activity did not occur: Safety/medical concerns  Wheelchair assist level: Dependent - Patient 0% (patient unable to follow-mulimodal cues for w/c propulsion) Max wheelchair distance: >150 ft    Wheelchair 50 feet with 2 turns activity    Assist        Assist Level: Dependent - Patient 0%   Wheelchair 150 feet activity     Assist      Assist Level: Dependent - Patient 0%    Medical Problem List and Plan: 1. 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  B/C vitamins added to aid stroke recovery  Continue CIR 2.  Impaired mobility: 70-90 feet: continue Lovenox             -antiplatelet therapy:  ASA daily 3. Low back pain: kpad added. Scheduled Tylenol 650mg  TID. LFTs reviewed and normal 4. Mood: LCSW to follow for evaluation and support.              -antipsychotic agents: N/A 5. Neuropsych: This patient is not capable of making decisions on her own behalf. 6. Skin/Wound Care: Routine pressure relief measures.  7. Fluids/Electrolytes/Nutrition: Monitor I/Os. 8. A fib: Will monitor BP TID--toprolol resumed after discussion with pharmacy, continue, well controlled             Monitor with increased activity 9. H/o Migraines: Was on sumatriptan PTA.               Monitor for increase in migraines, especially in association with vascular headaches 10. Leucocytosis: Monitor for fevers and other signs of infection.              WBC 16.1 on 12/4, continue to monitor  Afebrile  No signs/symptoms of infection  11. Urinary retention? 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  Dark-colored stools  Hemoccult ordered, CBC ordered for tomorrow 14. Decreased appetite: megace started.   LOS: 3 days A FACE TO FACE EVALUATION WAS  PERFORMED  Martha Clan P Marka Treloar 06/16/2021, 1:41 PM

## 2021-06-16 NOTE — Progress Notes (Signed)
Speech Language Pathology Daily Session Note  Patient Details  Name: Mckenzie Guerrero MRN: [id number] Date of Birth: 03/17/1951  Today's Date: 06/16/2021 SLP Individual Time: 3276-1470 SLP Individual Time Calculation (min): 40 min  Short Term Goals: Week 1: SLP Short Term Goal 1 (Week 1): Patient will consume current diet with min-modA cues for awareness to and clearing of oral residuals and without overt s/sx of aspiration SLP Short Term Goal 2 (Week 1): Patient will answer basic yes/no questions with 50% accuracy via multimodal communication with max A verbal and visual cues SLP Short Term Goal 3 (Week 1): Patient will imitate to perform gestures/signs to indicate basic wants/needs with maxA verbal, visual and tactile cues. SLP Short Term Goal 4 (Week 1): Patient will follow basic one-step verbal directions with max A verbal/visual/tactile cues and additional processing time SLP Short Term Goal 5 (Week 1): Patient will elicit intentional vocalizations during structured or non-structured speech tasks with max A verbal/visual/tactile cues  Skilled Therapeutic Interventions: Pt seen for skilled ST with focus on cognitive and swallowing goals, pt receiving lying flat in bed and nodding head in agreement to therapy. HOB raised for PO trials of Dys 2 solids. Pt tolerating single, small bite of Dys 2 solids presenting with extended mastication, decreased bolus awareness and diffuse oral stasis. SLP cueing patient to utilize liquid wash which was effective in clearing residue. Pt denying further intake at this point, rolling back and forth in bed and holding buttocks. SLP attempting to clarify if pt was in pain via simple yes-no questions, however pt nodding head "yes" to 100% of questions throughout session. RN made aware of pts suspected pain. SLP trialing simple Yes/No communication board to increase accuracy of responses, pt pointing to board on 25% of opportunities with MAX cues and encouragement,  accuracy <25% for yes/no responses despite max cues. Pt appears distracted by pain/discomfort during session. SLP facilitating simple 1-step directions by providing max A verbal cues with pt performance on 25% opportunities. Pt left in bed, lying on L side with bed alarm set and all needs within reach. Cont ST POC.  Pain Pain Assessment Pain Scale: Faces Faces Pain Scale: Hurts a little bit Pain Type: Acute pain Pain Location: Buttocks Pain Intervention(s): RN made aware  Therapy/Group: Individual Therapy  Dewaine Conger 06/16/2021, 2:25 PM

## 2021-06-16 NOTE — Progress Notes (Signed)
Physical Therapy Session Note  Patient Details  Name: Mckenzie Guerrero MRN: [id number] Date of Birth: 11/30/1950  Today's Date: 06/16/2021 PT Individual Time: 1115-1140 + [id number] PT Individual Time Calculation (min): 25 min + 40 min  Short Term Goals: Week 1:  PT Short Term Goal 1 (Week 1): Patient will perform basic transfers with CGA using LRAD. PT Short Term Goal 2 (Week 1): Patient will ambulate >150 ft using LRAD with min A. PT Short Term Goal 3 (Week 1): Patient will improved Berg Balance Scale by MCID (8 points) PT Short Term Goal 4 (Week 1): Patient will ascend/descend 12 step with min A using 1 rail to simulate home set-up.  Skilled Therapeutic Interventions/Progress Updates:     1st session: Pt seen supine in bed resting - appears agreeable to PT tx but aphasia impacting communication. Unreliable with yes/no questions with no apparent accuracy as she would consistently nod 'yes' to every question. She completed supine<>Sit with CGA with use of bed features - pt grimacing but unable to determine location of pain or reasoning. Able to sit unsupported at EOB with SBA where she continued to grimace. Completed stand<>pivot transfer with CGA and no AD to w/c, placed on her R side, to promote attention. Transported in w/c to main rehab gym for time where she was assisted to Nustep in similar manner - extra time needed due to aphasia. At [facility] table, provided large mirror to work on standing balance and R inattention. She was somewhat impulsive with abrupt standing without instruction and began ambulating with no AD and CGA for safety ~27ft and then ~68ft. While ambulating she would hold her bottom near sacral area with her LUE and unsure if pt needed bathroom. When asked, she nodded 'yes' so we returned to her room. In room ambulation completed at [facility] level and pt able to manage LB dressing in standing with CGA. Pt only noted to have small smear but no bladder or bowel incontinence. Handoff of  care to NT to allow time for toileting. When later approached, NT confirmed pt did not use restroom. Pt missed 20 minutes of skilled therapy due to toileting needs.   2nd session: Pt presents supine in bed. Spoke with RN regarding concern for buttock discomfort from earlier session. RN present to assess skin around peri area - no redness or skin breakdown noted. RN applied skin barrier and changed brief for comfort. Pt able to roll in bed with supervision. Donned pants with minA for pulling over hips - able to fully bridge to clear hips. Supine<>sit completed with supervision with HOB Flat and no bed rails. Ambulatory transfer with CGA and no AD from EOB to w/c. Transported to main rehab gym for time. Engaged in gait training in rehab gym - ambulating ~47ft + ~36ft + ~75ft with CGA/minA and no AD. Pt no longer holding buttock like earlier session and less grimacing noted as well. Pt instructed in stair training where she navigated x12 steps (up x8 3inch steps and down x4 6inch steps) with CGA and 2 hand rails without evidence of knee buckling or LOB. She was instructed in card matching game to encourage R attention, task sequencing. Pt unable to accurately match card (0% accuracy with 5 cards) despite demonstration and max cues. Pt transported back to her room at completion of session - assisted back to bed with stand<>pivot transfer and CGA. Bed mobility completed supervision with HOB flat. All needs in reach and bed alarm on. Pt nonverbal throughout today's sessions.  Therapy Documentation Precautions:  Precautions Precautions: Fall Precaution Comments: Pt with recent R hernia repair R inattention, aphasic, impulsive Restrictions Weight Bearing Restrictions: No General: PT Amount of Missed Time (min): 20 Minutes PT Missed Treatment Reason: Nursing care;Toileting  Therapy/Group: Individual Therapy   Oona Trammel P Pallas Wahlert PT 06/16/2021, 7:29 AM

## 2021-06-16 NOTE — Progress Notes (Incomplete Revision)
Soap suds enema done. Small bowel movement noted . RN helped with disimpaction as well.

## 2021-06-16 NOTE — Progress Notes (Signed)
Soap suds enema done. No results as of this time

## 2021-06-16 NOTE — Evaluation (Signed)
Speech Language Pathology Assessment and Plan  Patient Details  Name: Mckenzie Guerrero MRN: [id number] Date of Birth: May 10, 1951  SLP Diagnosis: Aphasia;Cognitive Impairments;Speech and Language deficits;Dysphagia  Rehab Potential: Fair ELOS: 12-14 days   Today's Date: 06/16/2021 SLP Individual Time: 0800-0900 SLP Individual Time Calculation (min): 60 min  Hospital Problem: Principal Problem:   Acute ischemic left middle cerebral artery (MCA) stroke (HCC) Active Problems:   Dysphagia, post-stroke  Past Medical History:  Past Medical History:  Diagnosis Date   Anxiety    Arthritis    osteoarthritis   Complication of anesthesia    GERD (gastroesophageal reflux disease)    Headache    migraines   History of bleeding ulcers 2010   History of hiatal hernia    Hypertension    Neuromuscular disorder (HCC)    tremors   Occasional tremors    PONV (postoperative nausea and vomiting)    Past Surgical History:  Past Surgical History:  Procedure Laterality Date   ANTERIOR CERVICAL DECOMP/DISCECTOMY FUSION N/A 09/28/2019   Procedure: Anterior Cervical Discectomy Fusion - Cervical Four-Cervical Five- Cervical Five-Cervical Six;  Surgeon: Earnie Larsson, MD;  Location: [facility];  Service: Neurosurgery;  Laterality: N/A;  Anterior Cervical Discectomy Fusion - Cervical Four-Cervical Five- Cervical Five-Cervical Six   benign tumor removal from left breast 2003  2003   BUNIONECTOMY Left    Great toe   COLONOSCOPY     ESOPHAGOGASTRODUODENOSCOPY N/A 05/25/2021   Procedure: ESOPHAGOGASTRODUODENOSCOPY (EGD);  Surgeon: Johnathan Hausen, MD;  Location: [facility];  Service: General;  Laterality: N/A;   XI ROBOTIC ASSISTED HIATAL HERNIA REPAIR N/A 05/25/2021   Procedure: XI ROBOTIC ASSISTED HIATAL HERNIA REPAIR WITH FUNDOPLICATION;  Surgeon: Johnathan Hausen, MD;  Location: [facility];  Service: General;  Laterality: N/A;    Assessment / Plan / Recommendation Clinical Impression Mckenzie Guerrero is a 70  year  old female with history of HTN, migraines, anxiety, PUD, tremors, large HH repair 05/25/21  who was admitted on 06/08/2021 after found in recline with right hemiparesis, right facial weakness with difficulty speaking and left gaze preference.  History taken from chart review and family due to cognition.  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 MRI brain showed evolving left MCA infarct with hemorrhagic transformation and trace rightward midline shift. She was kept NPO till swallow eval and has been advanced to D1 thin liquids. Follow up CT head 12/02 showed large left MCA infarct with hemorrhagic transformation and 3 mm shift to the right. She continues to be limited by right hemiparesis with left gaze preference with right inattention, balance deficits and is able to follow simple one step commands inconsistently. CIR recommended due to functional decline.  Please see preadmission assessment from earlier today as well.   Pt presents with severe-to-profound expressive/receptive language deficits consistent with global aphasia. Pt was most successful with answering basic and biographical yes/no questions via head nods. Pt with no attempts to verbally communicate. She was unable to elicit any spontaneous speech, intentional vocalizations, or follow commands for oral mechanism exam. 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 Groping was observed during one occasion. Motor planning deficits appeared to impact ability to gesture and point to communicate her functional needs. She followed basic one-step commands for bed level toileting care with mod A verbal and visual cues and repetition. Pt was also unable to follow other receptive language tasks including object identification or  use of low tech communication boards. Suspect visual deficits impacting right visual field contribute to difficulty with these tasks. Pt  exhibited decreased focused attention throughout evaluation and appeared internally distracted. She kept her eyes closed during majority of evaluation.   Pt obtained MBS on 12/2 revealing a severe oral dysphagia with recommendations for dysphagia 1 diet and thin liquids. Pt was total A for feeding. She accepted a few spoonfuls of dys 1 textures but displayed some oral resistance and then shook her head "no" during all further attempts. Pt consumed single and sequential sips of thin liquid via cup and straw without overt s/sx of aspiration and timely swallow response. Recommend continuation of current diet at this time with trials of dys 2 textures as appropriate.   Patient would benefit from skilled SLP intervention to maximize her speech/language, cognitive, and swallowing functioning, improve functional communication, independence, and decrease burden of care.    Skilled Therapeutic Interventions          Pt participated in informal speech/language, cognitive, and swallowing assessments. Please see above.   SLP Assessment  Patient will need skilled Speech Lanaguage Pathology Services during CIR admission    Recommendations  SLP Diet Recommendations: Dysphagia 1 (Puree);Thin Liquid Administration via: Straw;Cup Medication Administration: Crushed with puree Supervision: Staff to assist with self feeding Compensations: Slow rate;Small sips/bites;Minimize environmental distractions Postural Changes and/or Swallow Maneuvers: Seated upright 90 degrees Oral Care Recommendations: Oral care BID Patient destination: Home Follow up Recommendations: 24 hour supervision/assistance;Home Health SLP Equipment Recommended: None recommended by SLP    SLP Frequency 3 to 5 out of 7 days   SLP Duration  SLP Intensity  SLP Treatment/Interventions 12-14 days  Minumum of 1-2 x/day, 30 to 90 minutes  Cognitive remediation/compensation;Environmental controls;Internal/external aids;Multimodal communication  approach;Speech/Language facilitation;Cueing hierarchy;Dysphagia/aspiration precaution training;Functional tasks;Patient/family education;Therapeutic Activities    Pain    Prior Functioning Cognitive/Linguistic Baseline: Information not available Type of Home: House Available Help at Discharge: Family  SLP Evaluation Cognition Overall Cognitive Status: Impaired/Different from baseline Arousal/Alertness: Awake/alert Orientation Level: Oriented to person Year:  (Pt nodded head correctly when given choices) Month:  (unable to assess 2/2 aphasia) Day of Week:  (unable to assess 2/2 aphasia) Attention: Focused;Sustained Focused Attention: Impaired Focused Attention Impairment: Functional basic Sustained Attention: Impaired Sustained Attention Impairment: Functional basic Memory:  (unable to assess 2/2 aphasia) Awareness: Impaired Awareness Impairment: Intellectual impairment Problem Solving: Impaired Problem Solving Impairment: Functional basic Safety/Judgment: Impaired Comments: Assessed with functional performance due to patient non-verbal at evaluation  Comprehension Auditory Comprehension Overall Auditory Comprehension: Impaired Yes/No Questions: Impaired Basic Biographical Questions: 26-50% accurate Commands: Impaired One Step Basic Commands: 0-24% accurate Interfering Components: Attention;Visual impairments;Processing speed EffectiveTechniques: Repetition Visual Recognition/Discrimination Discrimination: Not tested Reading Comprehension Reading Status: Impaired Word level: Impaired Interfering Components: Attention;Processing time Expression Expression Primary Mode of Expression: Verbal Verbal Expression Overall Verbal Expression: Impaired Initiation: Impaired Automatic Speech:  (unable to vocalize) Repetition: Impaired Level of Impairment: Word level Naming: Impairment Responsive: 0-25% accurate Confrontation: Impaired Convergent: 0-24%  accurate Interfering Components: Attention Effective Techniques:  (yes/no questions only) Written Expression Dominant Hand: Right Written Expression: Unable to assess (comment) Oral Motor Oral Motor/Sensory Function Overall Oral Motor/Sensory Function:  (unable to assess as pt was unable to execute oral motor movements on command) Motor Speech Overall Motor Speech: Impaired Articulation:  (not tested) Intelligibility: Not tested  Care Tool Care Tool Cognition Ability to hear (with hearing aid or hearing appliances if normally used Ability to hear (with hearing aid or hearing appliances if  normally used): 0. Adequate - no difficulty in normal conservation, social interaction, listening to TV   Expression of Ideas and Wants Expression of Ideas and Wants: 1. Rarely/Never expressess or very difficult - rarely/never expresses self or speech is very difficult to understand   Understanding Verbal and Non-Verbal Content Understanding Verbal and Non-Verbal Content: 2. Sometimes understands - understands only basic conversations or simple, direct phrases. Frequently requires cues to understand  Memory/Recall Ability Memory/Recall Ability : None of the above were recalled   Intelligibility: Not tested  Bedside Swallowing Assessment General Date of Onset: 06/08/21 Previous Swallow Assessment: MBS on 12/1 Diet Prior to this Study: Dysphagia 1 (puree);Thin liquids Temperature Spikes Noted: No Respiratory Status: Room air History of Recent Intubation: No Oral Cavity - Dentition: Dentures, top;Dentures, bottom;Edentulous Self-Feeding Abilities: Total assist Patient Positioning: Upright in bed Baseline Vocal Quality: Not observed Volitional Cough: Cognitively unable to elicit Volitional Swallow: Unable to elicit  Oral Care Assessment Does patient have any of the following "high(er) risk" factors?: None of the above Does patient have any of the following "at risk" factors?: Other -  dysphagia Patient is HIGH RISK: Non-ventilated: Order set for Adult Oral Care Protocol initiated - "High Risk Patients - Non-Ventilated" option selected  (see row information) Patient is AT RISK: Order set for Adult Oral Care Protocol initiated -  "At Risk Patients" option selected (see row information) Patient is LOW RISK: Follow universal precautions (see row information) Ice Chips Ice chips: Not tested Thin Liquid Thin Liquid: Impaired Oral Phase Impairments: Reduced labial seal Oral Phase Functional Implications: Oral holding (oral holding only observed when sips were taken with meds) Nectar Thick Nectar Thick Liquid: Not tested Honey Thick Honey Thick Liquid: Not tested Puree Puree: Impaired Presentation: Spoon Oral Phase Impairments: Poor awareness of bolus;Reduced labial seal Oral Phase Functional Implications: Right anterior spillage Solid Solid: Not tested BSE Assessment Risk for Aspiration Impact on safety and function: Risk for inadequate nutrition/hydration  Short Term Goals: Week 1: SLP Short Term Goal 1 (Week 1): Patient will consume current diet with min-modA cues for awareness to and clearing of oral residuals and without overt s/sx of aspiration SLP Short Term Goal 2 (Week 1): Patient will answer basic yes/no questions with 50% accuracy via multimodal communication with max A verbal and visual cues SLP Short Term Goal 3 (Week 1): Patient will imitate to perform gestures/signs to indicate basic wants/needs with maxA verbal, visual and tactile cues. SLP Short Term Goal 4 (Week 1): Patient will follow basic one-step verbal directions with max A verbal/visual/tactile cues and additional processing time SLP Short Term Goal 5 (Week 1): Patient will elicit intentional vocalizations during structured or non-structured speech tasks with max A verbal/visual/tactile cues  Refer to Care Plan for Long Term Goals  Recommendations for other services: None   Discharge Criteria:  Patient will be discharged from SLP if patient refuses treatment 3 consecutive times without medical reason, if treatment goals not met, if there is a change in medical status, if patient makes no progress towards goals or if patient is discharged from hospital.  The above assessment, treatment plan, treatment alternatives and goals were discussed and mutually agreed upon: by patient  Edrees Valent T Anni Hocevar 06/16/2021, 8:00 AM

## 2021-06-16 NOTE — Progress Notes (Signed)
Occupational Therapy Session Note  Patient Details  Name: Mckenzie Guerrero MRN: [id number] Date of Birth: Oct 18, 1950  Today's Date: 06/16/2021 OT Individual Time: 2409-7353 OT Individual Time Calculation (min): 58 min    Short Term Goals: Week 1:  OT Short Term Goal 1 (Week 1): Pt will perform self feeding with Supervision and compensatory techniques/AE PRN OT Short Term Goal 2 (Week 1): Pt will perform shower level bathing CGA OT Short Term Goal 3 (Week 1): Pt will perform 3/3 toileting tasks with CGA  Skilled Therapeutic Interventions/Progress Updates:    Pt semi reclined in bed right gaze and flat affect.  Nodding head yes and no approximately 50% of time when asked yes/no questions with gestural cueing to assist with comprehension.  Pt slightly impulsive with mobility throughout session needing tactile and visual cues for safety awareness.  Pt completed supine to sit with supervision and sit to stand at RW with CGA.  Pt self initiated sitting and returning to sidelying with CGA appearing in discomfort however unable to express verbally or with gestures current complaint.  Assessed vitals: BP 112/68, pulse 52 and then assessed again returned to sitting EOB: BP 146/102.  Pt agreeable to sinkside bathing and dressing and completed stand pivot to w/c with min assist using RW.  Pt needed assist to initiate self care with max visual cues and hand over hand assist.  Pt doffed/donned shirt overhead with mod assist and doffed brief with max assist.  Pt appearing with discomfort again and leaning to side in w/c therefore offered toileting and pt nodded head yes. Stand pivot to [facility] over toilet using grab bar with min assist.  Pt then shaking her head no and initiating sit to stand.  Attempted to assess skin integrity over buttocks however pt resistive to this therefore discontinued.  Encouraged pt to complete pericare with warm washcloth standing at sink however pt perseverating on washing face and resistive  when tactile cues to transition to bathing periarea.  Donned brief with max assist and pants with mod assist.  Stand pivot to recliner with min assist and pt continuing to slide pelvis forward to edge of seat therefore transitioned to bed with min assist for safety. Call bell in reach, bed alarm on at end of session.  Nurse made aware of pt's signs of discomfort/pain and location. Missed 17 minutes of treatment due to pt pain level.  Therapy Documentation Precautions:  Precautions Precautions: Fall Precaution Comments: Pt with recent R hernia repair R inattention, aphasic, impulsive Restrictions Weight Bearing Restrictions: No     Therapy/Group: Individual Therapy  Ezekiel Slocumb 06/16/2021, 10:13 AM

## 2021-06-16 NOTE — IPOC Note (Signed)
Overall Plan of Care [facility]) Patient Details Name: Mckenzie Guerrero MRN: [id number] DOB: 1951-07-05  Admitting Diagnosis: Acute ischemic left middle cerebral artery (MCA) stroke [facility])  Hospital Problems: Principal Problem:   Acute ischemic left middle cerebral artery (MCA) stroke ([facility]) Active Problems:   Dysphagia, post-stroke     Functional Problem List: Nursing Endurance, Nutrition, Pain, Medication Management, Safety, Bladder, Skin Integrity  PT Balance, Perception, Safety, Behavior, Edema, Sensory, Skin Integrity, Endurance, Motor, Nutrition, Pain  OT Balance, Cognition, Edema, Endurance, Motor, Perception, Safety, Sensory, Vision  SLP Cognition, Nutrition, Safety, Sensory  TR         Basic ADL's: OT Eating, Grooming, Bathing, Dressing, Toileting     Advanced  ADL's: OT       Transfers: PT Bed Mobility, Bed to Chair, Car, Manufacturing systems engineer, Metallurgist: PT Emergency planning/management officer, Stairs, Ambulation     Additional Impairments: OT Fuctional Use of Upper Extremity  SLP Swallowing, Communication, Social Cognition expression, comprehension Social Interaction, Attention, Awareness  TR      Anticipated Outcomes Item Anticipated Outcome  Self Feeding Supervision  Set designer Transfers Supervision  Bowel/Bladder  manage bladder with mod I assist  Transfers  supervision using LRAD  Locomotion  supervision using LRAD >150 ft  Communication  mod A  Cognition     Pain  pain at or below level 4 w prns  Safety/Judgment  maintain safety w cues   Therapy Plan: PT Intensity: Minimum of 1-2 x/day ,45 to 90 minutes PT Frequency: 5 out of 7 days PT Duration Estimated Length of Stay: 12-14 days OT Intensity: Minimum of 1-2 x/day, 45 to 90 minutes OT Frequency: 5 out of 7 days OT Duration/Estimated Length of Stay: 12-14 days SLP Intensity: Minumum of 1-2 x/day, 30 to 90 minutes SLP  Frequency: 3 to 5 out of 7 days SLP Duration/Estimated Length of Stay: 12-14 days   Due to the current state of emergency, patients may not be receiving their 3-hours of Medicare-mandated therapy.   Team Interventions: Nursing Interventions Bladder Management, Disease Management/Prevention, Medication Management, Discharge Planning, Pain Management, Patient/Family Education, Dysphagia/Aspiration Precaution Training  PT interventions Ambulation/gait training, Cognitive remediation/compensation, Discharge planning, DME/adaptive equipment instruction, Functional mobility training, Pain management, Psychosocial support, Splinting/orthotics, Therapeutic Activities, UE/LE Strength taining/ROM, Visual/perceptual remediation/compensation, Wheelchair propulsion/positioning, UE/LE Coordination activities, Therapeutic Exercise, Stair training, Skin care/wound management, Patient/family education, Neuromuscular re-education, Functional electrical stimulation, Disease management/prevention, Academic librarian, Training and development officer  OT Interventions Training and development officer, Discharge planning, Functional electrical stimulation, Pain management, Self Care/advanced ADL retraining, Therapeutic Activities, UE/LE Coordination activities, Visual/perceptual remediation/compensation, Therapeutic Exercise, Skin care/wound managment, Patient/family education, Functional mobility training, Disease mangement/prevention, Cognitive remediation/compensation, Academic librarian, Engineer, drilling, Neuromuscular re-education, Psychosocial support, Splinting/orthotics, UE/LE Strength taining/ROM, Wheelchair propulsion/positioning  SLP Interventions Cognitive remediation/compensation, Environmental controls, Internal/external aids, Multimodal communication approach, Speech/Language facilitation, Cueing hierarchy, Dysphagia/aspiration precaution training, Functional tasks, Patient/family education,  Therapeutic Activities  TR Interventions    SW/CM Interventions Discharge Planning, Psychosocial Support, Patient/Family Education   Barriers to Discharge MD  Medical stability  Nursing Decreased caregiver support, Home environment access/layout, Nutrition means 2 level 1 ste no rails solo, son(s) to assist w care  PT Inaccessible home environment, Home environment access/layout, Incontinence, Behavior    OT Incontinence    SLP      SW Insurance for SNF coverage     Team Discharge Planning: Destination: PT-Home ,OT- Home , SLP-Home  Projected Follow-up: PT-Outpatient PT, OT-  Outpatient OT, SLP-24 hour supervision/assistance, Home Health SLP Projected Equipment Needs: PT-To be determined, OT- To be determined, SLP-None recommended by SLP Equipment Details: PT- , OT-  Patient/family involved in discharge planning: PT- Patient unable/family or caregiver not available,  OT-Patient unable/family or caregiver not available, SLP-Patient  MD ELOS: 2-3 weeks Medical Rehab Prognosis:  Excellent Assessment: Mrs. Mckenzie Guerrero is a 70 year old woman who is admitted to CIR right hemiparesis with left gaze preference with right inattention, balance deficits, cognitive deficits secondary to L MCA infarct. Medications are being managed, and labs and vitals are being monitored regularly.     See Team Conference Notes for weekly updates to the plan of care

## 2021-06-16 NOTE — Plan of Care (Signed)
  Problem: RH Swallowing Goal: LTG Patient will consume least restrictive diet using compensatory strategies with assistance (SLP) Description: LTG:  Patient will consume least restrictive diet using compensatory strategies with assistance (SLP) Flowsheets (Taken 06/16/2021 0803) LTG: Pt Patient will consume least restrictive diet using compensatory strategies with assistance of (SLP): Minimal Assistance - Patient > 75% Goal: LTG Pt will demonstrate functional change in swallow as evidenced by bedside/clinical objective assessment (SLP) Description: LTG: Patient will demonstrate functional change in swallow as evidenced by bedside/clinical objective assessment (SLP) Flowsheets (Taken 06/16/2021 0803) LTG: Patient will demonstrate functional change in swallow as evidenced by bedside/clinical objective assessment: Oral swallow   Problem: RH Comprehension Communication Goal: LTG Patient will comprehend basic/complex auditory (SLP) Description: LTG: Patient will comprehend basic/complex auditory information with cues (SLP). Flowsheets (Taken 06/16/2021 0803) LTG: Patient will comprehend: Basic auditory information LTG: Patient will comprehend auditory information with cueing (SLP): Moderate Assistance - Patient 50 - 74%   Problem: RH Expression Communication Goal: LTG Patient will express needs/wants via multi-modal(SLP) Description: LTG:  Patient will express needs/wants via multi-modal communication (gestures/written, etc) with cues (SLP) Flowsheets (Taken 06/16/2021 0803) LTG: Patient will express needs/wants via multimodal communication (gestures/written, etc) with cueing (SLP): Moderate Assistance - Patient 50 - 74%   Problem: RH Attention Goal: LTG Patient will demonstrate this level of attention during functional activites (SLP) Description: LTG:  Patient will will demonstrate this level of attention during functional activites (SLP) Flowsheets (Taken 06/16/2021 0803) Patient will  demonstrate during cognitive/linguistic activities the attention type of:  Focused  Sustained LTG: Patient will demonstrate this level of attention during cognitive/linguistic activities with assistance of (SLP): Moderate Assistance - Patient 50 - 74%

## 2021-06-16 NOTE — Plan of Care (Signed)
  Problem: RH BLADDER ELIMINATION Goal: RH STG MANAGE BLADDER WITH ASSISTANCE Description: STG Manage Bladder With mod I Assistance Outcome: Not Progressing; I and O cath

## 2021-06-17 ENCOUNTER — Inpatient Hospital Stay (HOSPITAL_COMMUNITY): Payer: PPO

## 2021-06-17 ENCOUNTER — Encounter (HOSPITAL_COMMUNITY): Payer: Self-pay | Admitting: Physical Medicine and Rehabilitation

## 2021-06-17 DIAGNOSIS — I63512 Cerebral infarction due to unspecified occlusion or stenosis of left middle cerebral artery: Secondary | ICD-10-CM | POA: Diagnosis not present

## 2021-06-17 DIAGNOSIS — K5900 Constipation, unspecified: Secondary | ICD-10-CM | POA: Diagnosis not present

## 2021-06-17 LAB — BASIC METABOLIC PANEL
Anion gap: 9 (ref 5–15)
BUN: 18 mg/dL (ref 8–23)
CO2: 21 mmol/L — ABNORMAL LOW (ref 22–32)
Calcium: 9.1 mg/dL (ref 8.9–10.3)
Chloride: 107 mmol/L (ref 98–111)
Creatinine, Ser: 0.69 mg/dL (ref 0.44–1.00)
GFR, Estimated: >60 mL/min (ref >60)
Glucose, Bld: 103 mg/dL — ABNORMAL HIGH (ref 70–99)
Potassium: 3.5 mmol/L (ref 3.5–5.1)
Sodium: 137 mmol/L (ref 135–145)

## 2021-06-17 LAB — OCCULT BLOOD X 1 CARD TO LAB, STOOL: Fecal Occult Bld: POSITIVE — AB

## 2021-06-17 LAB — MAGNESIUM: Magnesium: 2.2 mg/dL (ref 1.7–2.4)

## 2021-06-17 MED ORDER — LIDOCAINE HCL URETHRAL/MUCOSAL 2 % EX GEL
CUTANEOUS | Status: DC | PRN
  Filled 2021-06-17: qty 6

## 2021-06-17 MED ORDER — MAGNESIUM HYDROXIDE 400 MG/5ML PO SUSP
5.0000 mL | Freq: Every day | ORAL | Status: DC
  Administered 2021-06-17 – 2021-06-29 (×12): 5 mL via ORAL
  Filled 2021-06-17 (×13): qty 30

## 2021-06-17 MED ORDER — METOCLOPRAMIDE HCL 5 MG/ML IJ SOLN
5.0000 mg | Freq: Four times a day (QID) | INTRAMUSCULAR | Status: AC
  Administered 2021-06-17: 5 mg via INTRAVENOUS
  Filled 2021-06-17: qty 2

## 2021-06-17 MED ORDER — PEG 3350-KCL-NA BICARB-NACL 420 G PO SOLR
4000.0000 mL | Freq: Once | ORAL | Status: AC
  Administered 2021-06-17: 4000 mL via ORAL
  Filled 2021-06-17: qty 4000

## 2021-06-17 NOTE — Progress Notes (Signed)
[facility] PHYSICAL MEDICINE & REHABILITATION PROGRESS NOTE  Subjective/Complaints: Hard mass of stool causing pain and bleeding- GI consulted and recommended GoLitely, left message for her son to update him.  Team conference held today  ROS: Decreased appetite as per nursing, +back pain, +constipation  Objective: Vital Signs: Blood pressure 100/71, pulse 88, temperature 98 F (36.7 C), temperature source Oral, resp. rate 18, height 5\' 2"  (1.575 m), weight 59.5 kg, SpO2 98 %. No results found. Recent Labs    06/16/21 0514  WBC 13.2*  HGB 12.1  HCT 36.8  PLT 444*   No results for input(s): NA, K, CL, CO2, GLUCOSE, BUN, CREATININE, CALCIUM in the last 72 hours.   Intake/Output Summary (Last 24 hours) at 06/17/2021 1451 Last data filed at 06/17/2021 0430 Gross per 24 hour  Intake --  Output 1225 ml  Net -1225 ml        Physical Exam: BP 100/71 (BP Location: Left Arm)   Pulse 88   Temp 98 F (36.7 C) (Oral)   Resp 18   Ht 5\' 2"  (1.575 m)   Wt 59.5 kg   SpO2 98%   BMI 23.99 kg/m  Constitutional: No distress . Vital signs reviewed. Appears uncomfortable due to constipation. BMI 23.99 HENT: Normocephalic.  Atraumatic. Eyes: EOMI. No discharge. Cardiovascular: No JVD.  Irregularly irregular Respiratory: Normal effort.  No stridor.  Bilateral clear to auscultation. GI: Non-distended.  BS +. Skin: Warm and dry.  Intact. Psych: Unable to assess due to mentation Musc: No edema in extremities.  No tenderness in extremities. Tenderness to palpation of lower back Neuro: Alert Global aphasia Nods yes to all questions.  Unable to point or follow motor commands without tactile cues.  Right facial weakness.  Motor: Spontaneously moves all extremities, >/3/5 throughout, unchanged  Assessment/Plan: 1. Functional deficits which require 3+ hours per day of interdisciplinary therapy in a comprehensive inpatient rehab setting. Physiatrist is providing close team supervision and  24 hour management of active medical problems listed below. Physiatrist and rehab team continue to assess barriers to discharge/monitor patient progress toward functional and medical goals   Care Tool:  Bathing    Body parts bathed by patient: Right arm, Left arm, Chest, Abdomen, Face, Right upper leg, Left upper leg   Body parts bathed by helper: Front perineal area, Buttocks, Right lower leg, Left lower leg     Bathing assist Assist Level: Maximal Assistance - Patient 24 - 49%     Upper Body Dressing/Undressing Upper body dressing   What is the patient wearing?: Pull over shirt    Upper body assist Assist Level: Supervision/Verbal cueing    Lower Body Dressing/Undressing Lower body dressing      What is the patient wearing?: Pants, Incontinence brief     Lower body assist Assist for lower body dressing: Moderate Assistance - Patient 50 - 74%     Toileting Toileting    Toileting assist Assist for toileting: Maximal Assistance - Patient 25 - 49%     Transfers Chair/bed transfer  Transfers assist     Chair/bed transfer assist level: Minimal Assistance - Patient > 75%     Locomotion Ambulation   Ambulation assist   Ambulation activity did not occur: Safety/medical concerns  Assist level: Minimal Assistance - Patient > 75% Assistive device: Hand held assist Max distance: 40 ft   Walk 10 feet activity   Assist     Assist level: Minimal Assistance - Patient > 75% Assistive device: Hand held assist  Walk 50 feet activity   Assist Walk 50 feet with 2 turns activity did not occur: Safety/medical concerns  Assist level: Moderate Assistance - Patient - 50 - 74%      Walk 150 feet activity   Assist Walk 150 feet activity did not occur: Safety/medical concerns         Walk 10 feet on uneven surface  activity   Assist Walk 10 feet on uneven surfaces activity did not occur: Safety/medical concerns         Wheelchair     Assist Is  the patient using a wheelchair?: Yes   Wheelchair activity did not occur: Safety/medical concerns  Wheelchair assist level: Dependent - Patient 0% (patient unable to follow-mulimodal cues for w/c propulsion) Max wheelchair distance: >150 ft    Wheelchair 50 feet with 2 turns activity    Assist        Assist Level: Dependent - Patient 0%   Wheelchair 150 feet activity     Assist      Assist Level: Dependent - Patient 0%    Medical Problem List and Plan: 1. 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  B/C vitamins added to aid stroke recovery  Continue CIR 2.  Impaired mobility: 70-90 feet: continue Lovenox             -antiplatelet therapy:  ASA daily 3. Low back pain: kpad added. Scheduled Tylenol 650mg  TID. LFTs reviewed and normal 4. Mood: LCSW to follow for evaluation and support.              -antipsychotic agents: N/A 5. Neuropsych: This patient is not capable of making decisions on her own behalf. 6. Skin/Wound Care: Routine pressure relief measures.  7. Fluids/Electrolytes/Nutrition: Monitor I/Os. 8. A fib: Will monitor BP TID--toprolol resumed after discussion with pharmacy, continue, well controlled             Monitor with increased activity 9. H/o Migraines: Was on sumatriptan PTA.               Monitor for increase in migraines, especially in association with vascular headaches 10. Leucocytosis: Monitor for fevers and other signs of infection.              WBC 16.1 on 12/4, continue to monitor  Afebrile  No signs/symptoms of infection  11. Urinary retention? 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  Dark-colored stools  Hemoccult ordered, CBC stable, likely secondary to constipation 14. Decreased appetite: megace started.  15. Constipation:  consulted GI given hard mass- GoLytely ordered, left VM for son to update hi,.   LOS: 4 days A FACE TO FACE EVALUATION WAS PERFORMED  Mckenzie Guerrero 06/17/2021, 2:51 PM

## 2021-06-17 NOTE — Patient Care Conference (Signed)
Inpatient RehabilitationTeam Conference and Plan of Care Update Date: 06/17/2021   Time: 11:05 AM    Patient Name: Mckenzie Guerrero      Medical Record Number: [id number]  Date of Birth: 1951-02-10 Sex: Female         Room/Bed: 4W22C/4W22C-01 Payor Info: Payor: [employer]AGE / Plan: [employer] Must PPO / Product Type: *No Product type* /    Admit Date/Time:  06/13/2021  5:41 PM  Primary Diagnosis:  Acute ischemic left middle cerebral artery (MCA) stroke [facility])  Hospital Problems: Principal Problem:   Acute ischemic left middle cerebral artery (MCA) stroke [facility]) Active Problems:   Dysphagia, post-stroke    Expected Discharge Date: Expected Discharge Date: 06/30/21  Team Members Present: Physician leading conference: Dr. Leeroy Cha Social Worker Present: Ovidio Kin, LCSW Nurse Present: Dorien Chihuahua, RN PT Present: Ginnie Smart, PT OT Present: Leretha Pol, OT SLP Present: Nadara Mode, SLP PPS Coordinator present : Ileana Ladd, PT     Current Status/Progress Goal Weekly Team Focus  Bowel/Bladder   I&O cath Q6hr prn, Incontinent of bowel. LBM 12/6  Regain continent of bowel, and ability to void.  Toilet Q2-3 hr. Cath Q6hr PRN   Swallow/Nutrition/ Hydration   Dys 1, thin liquids  minA least restrictive diet  trials of Dys 2   ADL's   mod-max assist UB/LB self care, min to CGA functional transfers  supervision  functional transfers, self care training, activity tolerance, dc planning initiated   Mobility   minA bed mobility, CGA transfers, minA gait ~20-49ft with no AD.  supervision  Communicating needs, functional transfers, gait training with LRAD, safety awareness   Communication   max-totalA  mod A multimodal communication  yes/no questions, trial of communication board/pictures   Safety/Cognition/ Behavioral Observations  max-totalA  modA  basic level one-step command following   Pain   On scheduled pain medication. Some grimicing noted.   Pain <3/10  Assess Qshift and PRN   Skin   Closed incisions with skin glue on abdomen. MASD with excoriation on perineum.  Heal MASD.  Assess Qshift and PRN. Continue to apply Gerhardts butt cream     Discharge Planning:  Son's coming up with plan to provide 24/7 to pt, hopefully wil be voiding on own before DC   Team Discussion: Patient with poor appetite; megace started. Constipation addressed. Chronic low back pain addressed. Incontinent with toileting protocol; I+O cath prn rarely needed.  Patient on target to meet rehab goals: Progress limited by ideational apraxia, difficulty following commands and poor attention, right inattention, visual deficits and is difficult to engage in conversation or interactions. Currently needs mod - max assist for care and CGA for mobility. Able to ambulate without using a rolling walker with min assist and with a RW; needs CGA .  Goals for discharge set for supervision overal and mod assist for SLP/cognition.   *See Care Plan and progress notes for long and short-term goals.   Revisions to Treatment Plan:  D2 trials   Teaching Needs: Safety, medications, dietary modifications, secondary risk management, transfers, toileting, etc.  Current Barriers to Discharge: Decreased caregiver support  Possible Resolutions to Barriers: Family education with son(s) and friend     Medical Summary Current Status: decreased appetite, constipation, low back pain, urinary incontinence, severe aphasia, ideational apraxia, difficulty following commands  Barriers to Discharge: Medical stability  Barriers to Discharge Comments: decreased appetite, constipation, low back pain, urinary incontinence, severe aphasia, ideational apraxia, difficulty following commands Possible Resolutions to [employer] Focus:  started megace, started daily milk of magnesia/colace, add kpad/schedule tylenol 650mg  TID, consider starting Ritalin to help with attention   Continued Need for  Acute Rehabilitation Level of Care: The patient requires daily medical management by a physician with specialized training in physical medicine and rehabilitation for the following reasons: Direction of a multidisciplinary physical rehabilitation program to maximize functional independence : Yes Medical management of patient stability for increased activity during participation in an intensive rehabilitation regime.: Yes Analysis of laboratory values and/or radiology reports with any subsequent need for medication adjustment and/or medical intervention. : Yes   I attest that I was present, lead the team conference, and concur with the assessment and plan of the team.   Dorien Chihuahua B 06/17/2021, 5:23 PM

## 2021-06-17 NOTE — Progress Notes (Signed)
Physical Therapy Session Note  Patient Details  Name: Mckenzie Guerrero MRN: [id number] Date of Birth: 22-Aug-1950  Today's Date: 06/17/2021 PT Individual Time: 1130-1200 PT Individual Time Calculation (min): 30 min   Short Term Goals: Week 1:  PT Short Term Goal 1 (Week 1): Patient will perform basic transfers with CGA using LRAD. PT Short Term Goal 2 (Week 1): Patient will ambulate >150 ft using LRAD with min A. PT Short Term Goal 3 (Week 1): Patient will improved Berg Balance Scale by MCID (8 points) PT Short Term Goal 4 (Week 1): Patient will ascend/descend 12 step with min A using 1 rail to simulate home set-up.  Skilled Therapeutic Interventions/Progress Updates:     Pt supine in bed to start session. Awake and appears agreeable to PT tx but aphasia impacting communication. Pt with flat affect, nonverbal or attempts at verbalizing during session. Nods head 'yes' 100% of the time to all questions.   Supine<>sit with supervision with bed features. Provided her pants to don and she was able to do so with minA with some motor apraxia noted. Pt did not pull over knees but impulsively stood and began walking to bathroom with pants around ankles with poor safety awareness. Pt sitting on toilet and noted small smear of BM on brief. Removed dirty brief and pt appeared uncomfortable or in pain, unsure due to aphasia. Attempted to assist with cleaning posterior pericare but pt gently swatting away washcloth. Handed her washcloth but she would wipe face instead of bottom.   Required minA for balance while she pulled pants over hips in standing. Ambulated with minA and no AD to sink where she was instructed in hand washing but would instead wash her face. Some moderate perseveration noted while doing this.   Pt assisted to bed with minA and no AD and she pointed to tray table. She took small sips of water with supervision while seated EOB. She was able to open orange juice without assist but spillage  while bringing to mouth.   Due to time, called NT for handoff of care as pt requires full supervision for meals. Pt completed session seated EOB with NT present. All needs in reach.   Therapy Documentation Precautions:  Precautions Precautions: Fall Precaution Comments: Pt with recent R hernia repair R inattention, aphasic, impulsive Restrictions Weight Bearing Restrictions: No General:    Therapy/Group: Individual Therapy  Keilah Lemire P Edrees Valent 06/17/2021, 7:45 AM

## 2021-06-17 NOTE — Progress Notes (Signed)
Nurse and Jeannene Patella PA attempted disimpaction. Small stool was removed. Pt repeatedly pushing [place] away. Stool was soft.   Arn Medal, LPN

## 2021-06-17 NOTE — Progress Notes (Signed)
Physical Therapy Session Note  Patient Details  Name: Mckenzie Guerrero MRN: [id number] Date of Birth: 06/21/1951  Today's Date: 06/17/2021 PT Individual Time: 0803-0912 PT Individual Time Calculation (min): 69 min   Short Term Goals: Week 1:  PT Short Term Goal 1 (Week 1): Patient will perform basic transfers with CGA using LRAD. PT Short Term Goal 2 (Week 1): Patient will ambulate >150 ft using LRAD with min A. PT Short Term Goal 3 (Week 1): Patient will improved Berg Balance Scale by MCID (8 points) PT Short Term Goal 4 (Week 1): Patient will ascend/descend 12 step with min A using 1 rail to simulate home set-up.  Skilled Therapeutic Interventions/Progress Updates: Pt presented in bed with nsg present administering meds. Pt performed supine to sit with supervision and use of bed features so that pt can more easily receive meds. Pt noted to spill a significant amount of liquid meds on self and bed. Pt also noted to be reaching for groin vs buttocks. Once completed pt ambulated to bathroom, performed toilet transfer with supervision and PTA provided wet washcloth to clean upper legs. Per NT who arrived with breakfast rash noted in groin recently. PTA applied antifungal power to groin. PTA also donned new brief with noted small smear but no urinary void nor BM. Pt then returned to bed and "climbed in" then rolled to R. PTA asking pt if "hurt" at head, stomach, or buttock to which pt nodded "no" for all. PTA then encouraged pt to OOB to which pt eventually performed with supervision. Pt transferred to recliner and PTA set up breakfast to which pt refused all but did drink some water. PTA then encouraged pt to transfer to w/c and performed oral hygiene with minA as pt did not rinse mouth and when instructed only drank the water. When provided with comb pt did attempt to comb hair and PTA noted that pt incorporated both hands. Pt then transferred to ortho gym and participated in BITS tracing for forced use  of RUE and sustained task. Pt performed cross, triangle and hexagon with best score 90% with cross and worst score 80% with hexagon. Pt noted to frequent brush 3rd finger against screen. Pt then attempted visual pursuit with single target however pt was unable to local dot with max cues and only traced fingers on screen therefore activity terminated. Pt then transferred to day room and attempted to set up [employer]. Pt unable to folliow commands for w/c level (placing feet on pedals) however attempted to stand and step up on machine. PTA deferred when pt began grabbing buttock, PTA asked "bathroom" with pt nodding "yes". Pt transferred partial distance and then ambulated in hallway to room with CGA and PTA noting decreased R foot clearance with distance. Pt sat at toilet (-BM/void) but smear noted on brief. Pt appeared to be straining but unsuccessful. PTA changed brief and pt ambulated to bed. Performed sit to supine with supervision and bed flat. Pt left in bed at end of session with bed alarm on, call bell within reach and needs met.      Therapy Documentation Precautions:  Precautions Precautions: Fall Precaution Comments: Pt with recent R hernia repair R inattention, aphasic, impulsive Restrictions Weight Bearing Restrictions: No General:   Vital Signs:  Pain: Pain Assessment Pain Scale: Faces Faces Pain Scale: Hurts a little bit   Therapy/Group: Individual Therapy  Rosita DeChalus Rosita DeChalus, PTA  06/17/2021, 12:03 PM  

## 2021-06-17 NOTE — Progress Notes (Signed)
Patient ID: Mckenzie Guerrero, female   DOB: 1950-08-02, 70 y.o.   MRN: [id number] Met with pt and spoke with son-Mckenzie Guerrero to inform of team conference goals of supervision level and target discharge date of 12/20. Discussed coming in end of next week to do some hands on training and will set up next week. He will talk with his brother's and let me know who is coming. Will work on discharge needs-aware Mom will need 24/7 supervision at discharge.

## 2021-06-17 NOTE — Progress Notes (Signed)
Attempted to disimpact x2. Pt pushed nurses hand away, no longer willing to continue. Only able to remove a very small amount each time. On 2nd attempt stool felt to be softer. Will let oncoming shift know.

## 2021-06-17 NOTE — Progress Notes (Signed)
SLP Cancellation Note  Patient Details Name: Mckenzie Guerrero MRN: [id number] DOB: Dec 30, 1950   Cancelled treatment:        Patient receiving nursing and PA care for fecal impaction. Missed 45 minute ST session.                                                                                                Sonia Baller, MA, CCC-SLP Speech Therapy

## 2021-06-17 NOTE — Progress Notes (Signed)
Occupational Therapy Session Note  Patient Details  Name: Mckenzie Guerrero MRN: [id number] Date of Birth: 1951-03-02  Today's Date: 06/17/2021 OT Individual Time: 1330-1414 OT Individual Time Calculation (min): 44 min    Short Term Goals: Week 1:  OT Short Term Goal 1 (Week 1): Pt will perform self feeding with Supervision and compensatory techniques/AE PRN OT Short Term Goal 2 (Week 1): Pt will perform shower level bathing CGA OT Short Term Goal 3 (Week 1): Pt will perform 3/3 toileting tasks with CGA  Skilled Therapeutic Interventions/Progress Updates:    Pt sidelying in bed, grasping at buttocks.  Nodding "yes" when asked she has pain in buttocks.  Pt nodded "yes" when asked if she wants to use bathroom.  Supervision supine to sit and ambulation using RW to bathroom.  CGA needed for toilet transfer using grab bar.  Pt tolerated sitting for approximately one minute then self initiating standing despite encouragement give more time.  Pt ambulated back to bed and self initiated sit to supine with supervision.  Pt agreeable to therapist assessing buttocks and doffed brief and pants with min assist at bed level.  Noted smearing in brief but no overt redness over skin.  Requested nurse for further assessment.  Pt required max multimodal and simple cues to encourage receptiveness and participation during enema and digital disimpaction in sidelying.  Pt required max assist to donn brief rolling left and right.  Pt nodded "yes" to donning pants and competed at sit<>stand level EOB with supervision.  Pt shaking head no in response to participating in further OT session, therefore missed 16 minutes of treatment.  Call bell in reach, bed alarm on.  Therapy Documentation Precautions:  Precautions Precautions: Fall Precaution Comments: Pt with recent R hernia repair R inattention, aphasic, impulsive Restrictions Weight Bearing Restrictions: No   Therapy/Group: Individual Therapy  Ezekiel Slocumb 06/17/2021, 2:17 PM

## 2021-06-17 NOTE — Progress Notes (Signed)
Patient needing disimpaction with poor attempts as patient unable and not able to cooperate. SSE attempted yesterday but unable to perform due to stool in rectal vault. Patient with global aphasia and has been restless with multiple trips to bathroom, ongoing small amount of loose stools with MASD noted. Abdomen is SNT with + BS. No nausea but intake has been poor. She was holding her perineum with restless moments in bed and frequent trips to BR without success. Dicussed with [facility] who recommended gallon of golytely over 8 hours, Reglan IV before and after completion of prep as long as lytes stable and no signs of obstruction. NGT can be placed to administer prep if patient unable to drink it--if no success with above, to contact them in am. Attempted to gently disimpact patient brifely as able with use of lidocaine jelly but stool like soft putty and high in rectal vault and patient unable to tolerate.

## 2021-06-18 DIAGNOSIS — I63512 Cerebral infarction due to unspecified occlusion or stenosis of left middle cerebral artery: Secondary | ICD-10-CM | POA: Diagnosis not present

## 2021-06-18 LAB — CBC
HCT: 35.7 % — ABNORMAL LOW (ref 36.0–46.0)
Hemoglobin: 12.2 g/dL (ref 12.0–15.0)
MCH: 30.8 pg (ref 26.0–34.0)
MCHC: 34.2 g/dL (ref 30.0–36.0)
MCV: 90.2 fL (ref 80.0–100.0)
Platelets: 460 10*3/uL — ABNORMAL HIGH (ref 150–400)
RBC: 3.96 MIL/uL (ref 3.87–5.11)
RDW: 12.4 % (ref 11.5–15.5)
WBC: 15.5 10*3/uL — ABNORMAL HIGH (ref 4.0–10.5)
nRBC: 0 % (ref 0.0–0.2)

## 2021-06-18 LAB — OCCULT BLOOD X 1 CARD TO LAB, STOOL: Fecal Occult Bld: NEGATIVE

## 2021-06-18 MED ORDER — APIXABAN 5 MG PO TABS
5.0000 mg | ORAL_TABLET | Freq: Two times a day (BID) | ORAL | Status: DC
  Administered 2021-06-18 – 2021-06-30 (×23): 5 mg via ORAL
  Filled 2021-06-18 (×24): qty 1

## 2021-06-18 NOTE — Progress Notes (Addendum)
Hemoccult stool sample obtained and sent to lab. Frank red blood noted in feces. Marlowe [place], PA aware.

## 2021-06-18 NOTE — Progress Notes (Signed)
[facility] PHYSICAL MEDICINE & REHABILITATION PROGRESS NOTE  Subjective/Complaints: Had large BM today Son and his wife are at bedside She waves bye Tries to say son's name  ROS: Decreased appetite as per nursing, +back pain, +constipation-resolved  Objective: Vital Signs: Blood pressure (!) 114/57, pulse 71, temperature 98.7 F (37.1 C), resp. rate 14, height 5\' 2"  (1.575 m), weight 59.5 kg, SpO2 100 %. DG Abd 1 View  Result Date: 06/17/2021 CLINICAL DATA:  Constipation. EXAM: ABDOMEN - 1 VIEW COMPARISON:  None. FINDINGS: There is mild-to-moderate amount of stool mixed with contrast throughout the colon. No bowel dilatation or evidence of obstruction. Colonic diverticula noted. No free air. Degenerative changes of the spine. No acute osseous pathology. IMPRESSION: 1. No bowel obstruction. 2. Colonic diverticulosis. Electronically Signed   By: Anner Crete M.D.   On: 06/17/2021 19:17   Recent Labs    06/16/21 0514 06/18/21 0504  WBC 13.2* 15.5*  HGB 12.1 12.2  HCT 36.8 35.7*  PLT 444* 460*   Recent Labs    06/17/21 1451  NA 137  K 3.5  CL 107  CO2 21*  GLUCOSE 103*  BUN 18  CREATININE 0.69  CALCIUM 9.1    No intake or output data in the 24 hours ending 06/18/21 1530       Physical Exam: BP (!) 114/57   Pulse 71   Temp 98.7 F (37.1 C)   Resp 14   Ht 5\' 2"  (1.575 m)   Wt 59.5 kg   SpO2 100%   BMI 23.99 kg/m  Constitutional: No distress . Vital signs reviewed. Appears uncomfortable due to constipation. BMI 23.99 HENT: Normocephalic.  Atraumatic. Eyes: EOMI. No discharge. Cardiovascular: No JVD.  Irregularly irregular Respiratory: Normal effort.  No stridor.  Bilateral clear to auscultation. GI: Non-distended.  BS +. Skin: Warm and dry.  Intact. Psych: Unable to assess due to mentation Musc: No edema in extremities.  No tenderness in extremities. Tenderness to palpation of lower back Neuro: Alert Global aphasia Nods yes to all questions, tries to  say her son's name.  Unable to point or follow motor commands without tactile cues.  Right facial weakness.  Motor: Spontaneously moves all extremities, >/3/5 throughout, unchanged  Assessment/Plan: 1. Functional deficits which require 3+ hours per day of interdisciplinary therapy in a comprehensive inpatient rehab setting. Physiatrist is providing close team supervision and 24 hour management of active medical problems listed below. Physiatrist and rehab team continue to assess barriers to discharge/monitor patient progress toward functional and medical goals   Care Tool:  Bathing    Body parts bathed by patient: Right arm, Left arm, Chest, Abdomen, Face, Right upper leg, Left upper leg   Body parts bathed by helper: Front perineal area, Buttocks, Right lower leg, Left lower leg     Bathing assist Assist Level: Maximal Assistance - Patient 24 - 49%     Upper Body Dressing/Undressing Upper body dressing   What is the patient wearing?: Pull over shirt    Upper body assist Assist Level: Supervision/Verbal cueing    Lower Body Dressing/Undressing Lower body dressing      What is the patient wearing?: Pants, Incontinence brief     Lower body assist Assist for lower body dressing: Moderate Assistance - Patient 50 - 74%     Toileting Toileting    Toileting assist Assist for toileting: Maximal Assistance - Patient 25 - 49%     Transfers Chair/bed transfer  Transfers assist     Chair/bed  transfer assist level: Contact Guard/Touching assist     Locomotion Ambulation   Ambulation assist   Ambulation activity did not occur: Safety/medical concerns  Assist level: Contact Guard/Touching assist Assistive device: Walker-rolling Max distance: 74ft   Walk 10 feet activity   Assist     Assist level: Contact Guard/Touching assist Assistive device: Walker-rolling   Walk 50 feet activity   Assist Walk 50 feet with 2 turns activity did not occur: Safety/medical  concerns  Assist level: Contact Guard/Touching assist Assistive device: Walker-rolling    Walk 150 feet activity   Assist Walk 150 feet activity did not occur: Safety/medical concerns         Walk 10 feet on uneven surface  activity   Assist Walk 10 feet on uneven surfaces activity did not occur: Safety/medical concerns         Wheelchair     Assist Is the patient using a wheelchair?: Yes   Wheelchair activity did not occur: Safety/medical concerns  Wheelchair assist level: Dependent - Patient 0% (patient unable to follow-mulimodal cues for w/c propulsion) Max wheelchair distance: >150 ft    Wheelchair 50 feet with 2 turns activity    Assist        Assist Level: Dependent - Patient 0%   Wheelchair 150 feet activity     Assist      Assist Level: Dependent - Patient 0%    Medical Problem List and Plan: 1. 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  B/C vitamins added to aid stroke recovery  Continue CIR 2.  Impaired mobility: 70-90 feet: continue Lovenox             -antiplatelet therapy:  ASA daily 3. Low back pain: kpad added. Continue scheduled Tylenol 650mg  TID. LFTs reviewed and normal 4. Mood: LCSW to follow for evaluation and support.              -antipsychotic agents: N/A 5. Neuropsych: This patient is not capable of making decisions on her own behalf. 6. Skin/Wound Care: Routine pressure relief measures.  7. Fluids/Electrolytes/Nutrition: Monitor I/Os. 8. A fib: Will monitor BP TID--toprolol resumed after discussion with pharmacy, continue, well controlled             Monitor with increased activity 9. H/o Migraines: Was on sumatriptan PTA.               Monitor for increase in migraines, especially in association with vascular headaches 10. Leucocytosis: Monitor for fevers and other signs of infection.              WBC 16.1 on 12/4, continue  to monitor, repeat tomorrow.  Afebrile  No signs/symptoms of infection  11. Urinary retention? Incontinence?: continuing voiding trial. 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  Dark-colored stools  Hemoccult ordered, CBC stable, likely secondary to constipation 14. Decreased appetite: megace started.  15. Constipation: consulted GI given hard mass- GoLytely ordered, left VM for son to update hi,.   LOS: 5 days A FACE TO FACE EVALUATION WAS PERFORMED  Martha Clan P Darlyne Schmiesing 06/18/2021, 3:30 PM

## 2021-06-18 NOTE — Progress Notes (Signed)
Speech Language Pathology Daily Session Note  Patient Details  Name: Mckenzie Guerrero MRN: [id number] Date of Birth: 08/07/1950  Today's Date: 06/18/2021 SLP Individual Time: 1005-1100 SLP Individual Time Calculation (min): 55 min  Short Term Goals: Week 1: SLP Short Term Goal 1 (Week 1): Patient will consume current diet with min-modA cues for awareness to and clearing of oral residuals and without overt s/sx of aspiration SLP Short Term Goal 2 (Week 1): Patient will answer basic yes/no questions with 50% accuracy via multimodal communication with max A verbal and visual cues SLP Short Term Goal 3 (Week 1): Patient will imitate to perform gestures/signs to indicate basic wants/needs with maxA verbal, visual and tactile cues. SLP Short Term Goal 4 (Week 1): Patient will follow basic one-step verbal directions with max A verbal/visual/tactile cues and additional processing time SLP Short Term Goal 5 (Week 1): Patient will elicit intentional vocalizations during structured or non-structured speech tasks with max A verbal/visual/tactile cues  Skilled Therapeutic Interventions:   Patient seen for skilled ST session with son present for large portion of session. Upon SLP entering room, she was awake and alert but grimacing in apparent discomfort, moving legs and appearing to perform pressure-relief on buttocks by lifting up frequently. She did confirm with "yeah" and head nod that she wanted water. She drank water via straw sips and aside from mild amount of anterior spillage, no overt s/s aspiration or penetration observed. Patient was able to follow basic level verbal commands/directions when SLP assisting with repositioning in bed. She would primarily verbally respond with 'yes' and so SLP had to interpret her facial expressions to determine approximation of what she wanted. SLP stepped out of room briefly to talk to PA and when returned, patient was in middle of having a BM. SLP assisted with cleaning  patient and changing linens, etc. Patient was able to follow commands to turn to left/right, etc during this. Patient then appeared more pleasant and comfortable, smiling a little and confirming with "yeah" that she was feeling some better. Patient left in bed with family members present in room. She continues to benefit from skilled SLP intervention to maximize cognitive-linguistic and swallow function goals prior to discharge.  Pain Pain Assessment Pain Scale: Faces Pain Score: 0-No pain Faces Pain Scale: Hurts little more Pain Type: Acute pain Pain Location: Buttocks Pain Orientation: Mid Pain Descriptors / Indicators: Discomfort;Grimacing Pain Onset: On-going Pain Intervention(s): Repositioned Multiple Pain Sites: No  Therapy/Group: Individual Therapy  Sonia Baller, MA, CCC-SLP Speech Therapy

## 2021-06-18 NOTE — Progress Notes (Signed)
Occupational Therapy Session Note  Patient Details  Name: Mckenzie Guerrero MRN: [id number] Date of Birth: 04/18/1951  Today's Date: 06/18/2021 OT Individual Time: 7106-2694 OT Individual Time Calculation (min): 55 min    Short Term Goals: Week 1:  OT Short Term Goal 1 (Week 1): Pt will perform self feeding with Supervision and compensatory techniques/AE PRN OT Short Term Goal 2 (Week 1): Pt will perform shower level bathing CGA OT Short Term Goal 3 (Week 1): Pt will perform 3/3 toileting tasks with CGA  Skilled Therapeutic Interventions/Progress Updates:    Pt resting in bed upon arrival with family present. Pt agreeable to getting OOB. Supine>sit EOB with supervision. Pt threaded pants with supervision. Sit>stand with CGA and pulled pants over hips. Pt amb into hallway with CGA. After about 50', pt indicated she needed to return to room and use toilet. Pt incontinent of bowel. Pt with persistent small liquid bowel movements. Pt returned to bed and NT assisted with hygiene and donning clean brief. Pt changed shirt while seated in bed. Pt remained in bed with all needs within reach. Bed alarm activated. Family present.   Therapy Documentation Precautions:  Precautions Precautions: Fall Precaution Comments: Pt with recent R hernia repair R inattention, aphasic, impulsive Restrictions Weight Bearing Restrictions: No  Pain: Pt no s/s of acute pain but did indicate her stomach hurt and incontinent of bowel; toileted and changed clothing    Therapy/Group: Individual Therapy  Leroy Libman 06/18/2021, 2:33 PM

## 2021-06-18 NOTE — Progress Notes (Signed)
Physical Therapy Session Note  Patient Details  Name: Mckenzie Guerrero MRN: [id number] Date of Birth: 1950-12-30  Today's Date: 06/18/2021 PT Individual Time: 1111-1200 PT Individual Time Calculation (min): 49 min   Short Term Goals: Week 1:  PT Short Term Goal 1 (Week 1): Patient will perform basic transfers with CGA using LRAD. PT Short Term Goal 2 (Week 1): Patient will ambulate >150 ft using LRAD with min A. PT Short Term Goal 3 (Week 1): Patient will improved Berg Balance Scale by MCID (8 points) PT Short Term Goal 4 (Week 1): Patient will ascend/descend 12 step with min A using 1 rail to simulate home set-up.  Skilled Therapeutic Interventions/Progress Updates:     Pt supine in bed to start with son and daughter-in-law at bedside. Lengthy discussion with family regarding Pt's current mobility status, PT POC, PT goals, aphasia impacting communication/understanding, f/u therapy recommendations, importance of 24/7 supervision/assist at DC and options for this (private caregivers vs family vs facility placement). All questions and concerns addressed for time being with pt and family.  Instructed pt in supine<>sit with supervision assist and bed features. At EOB, donned pants with setupA. Sit<>stand with CGA for safety and pt quickly ambulating to bathroom with CGA and no AD. Pt noted to be incontinent of bowel with medium size BM. Pt continent of further, loose stool on toilet. TotalA for posterior perciare and brief change. LPN called to bedside as nursing requesting stool sample .  Pt instructed in gait within hallway with RW and CGA for safety - ambulated ~85f prior to turning back ot her room as she was grimmacing and showed signs of discomfort. Once returned to room, pt noted to be incontinent of bowel again with loose stool on bed linen. Returned to bathroom and pt continent of further loose BM.   NT called for assist for linen change and for assisting cleaning with pt. Direct handoff  of care with family at bedside and pt on toilet with NT supervision. All needs met.   Therapy Documentation Precautions:  Precautions Precautions: Fall Precaution Comments: Pt with recent R hernia repair R inattention, aphasic, impulsive Restrictions Weight Bearing Restrictions: No General:    Therapy/Group: Individual Therapy  CAlger Simons12/02/2021, 7:32 AM

## 2021-06-19 ENCOUNTER — Other Ambulatory Visit (HOSPITAL_COMMUNITY): Payer: Self-pay

## 2021-06-19 DIAGNOSIS — I63512 Cerebral infarction due to unspecified occlusion or stenosis of left middle cerebral artery: Secondary | ICD-10-CM | POA: Diagnosis not present

## 2021-06-19 LAB — CBC WITH DIFFERENTIAL/PLATELET
Abs Immature Granulocytes: 0.06 10*3/uL (ref 0.00–0.07)
Basophils Absolute: 0.1 10*3/uL (ref 0.0–0.1)
Basophils Relative: 1 %
Eosinophils Absolute: 0.3 10*3/uL (ref 0.0–0.5)
Eosinophils Relative: 2 %
HCT: 35.4 % — ABNORMAL LOW (ref 36.0–46.0)
Hemoglobin: 12 g/dL (ref 12.0–15.0)
Immature Granulocytes: 0 %
Lymphocytes Relative: 21 %
Lymphs Abs: 3 10*3/uL (ref 0.7–4.0)
MCH: 30.7 pg (ref 26.0–34.0)
MCHC: 33.9 g/dL (ref 30.0–36.0)
MCV: 90.5 fL (ref 80.0–100.0)
Monocytes Absolute: 1.3 10*3/uL — ABNORMAL HIGH (ref 0.1–1.0)
Monocytes Relative: 9 %
Neutro Abs: 9.7 10*3/uL — ABNORMAL HIGH (ref 1.7–7.7)
Neutrophils Relative %: 67 %
Platelets: 472 10*3/uL — ABNORMAL HIGH (ref 150–400)
RBC: 3.91 MIL/uL (ref 3.87–5.11)
RDW: 12.7 % (ref 11.5–15.5)
WBC: 14.4 10*3/uL — ABNORMAL HIGH (ref 4.0–10.5)
nRBC: 0 % (ref 0.0–0.2)

## 2021-06-19 NOTE — Progress Notes (Signed)
Physical Therapy Session Note  Patient Details  Name: Mckenzie Guerrero MRN: [id number] Date of Birth: 13-Sep-1950  Today's Date: 06/19/2021 PT Individual Time: 8341-9622 PT Individual Time Calculation (min): 10 min   Short Term Goals: Week 1:  PT Short Term Goal 1 (Week 1): Patient will perform basic transfers with CGA using LRAD. PT Short Term Goal 2 (Week 1): Patient will ambulate >150 ft using LRAD with min A. PT Short Term Goal 3 (Week 1): Patient will improved Berg Balance Scale by MCID (8 points) PT Short Term Goal 4 (Week 1): Patient will ascend/descend 12 step with min A using 1 rail to simulate home set-up. Week 2:     Skilled Therapeutic Interventions/Progress Updates: Upon entering room pt sitting EOB with NT present. PTA encouraging pt to get up and placing w/c near bed to have pt ambulate. Pt nodding head no then playing with remote. PTA attempting strategies to have pt pay attention to PTA vs TV including muting TV. PTA then ultimately turning off TV and stating time for therapy. Pt then ultimately rolling back into bed, grabbing remote and able to figure out how to turn on with pt shaking head no. Pt left in bed with bed alarm on and 4th rail up. Pt missed 50 min skilled PT due to pt refusal.      Therapy Documentation Precautions:  Precautions Precautions: Fall Precaution Comments: Pt with recent R hernia repair R inattention, aphasic, impulsive Restrictions Weight Bearing Restrictions: No General: PT Amount of Missed Time (min): 50 Minutes PT Missed Treatment Reason: Patient unwilling to participate Vital Signs: Therapy Vitals Temp: 97.9 F (36.6 C) Pulse Rate: 97 Resp: 16 BP: 96/65 Patient Position (if appropriate): Lying Oxygen Therapy SpO2: 100 % O2 Device: Room Air Pain:   Mobility:   Locomotion :    Trunk/Postural Assessment :    Balance:   Exercises:   Other Treatments:      Therapy/Group: Individual Therapy  Arelis Neumeier 06/19/2021,  4:33 PM

## 2021-06-19 NOTE — TOC Benefit Eligibility Note (Signed)
Patient Teacher, English as a foreign language completed.    The patient is currently admitted and upon discharge could be taking Eliquis 5 mg.  The current 30 day co-pay is, $9.85.   The patient is insured through [place] Patient Advocate Specialist [place] Patient Advocate Team Direct Number: [phone]  Fax: [phone]

## 2021-06-19 NOTE — Progress Notes (Addendum)
[facility] PHYSICAL MEDICINE & REHABILITATION PROGRESS NOTE  Subjective/Complaints: Aphasic Can follow 1-step directions intermittently  Right sided inattention Ambulating within room CG  ROS: Decreased appetite as per nursing, +back pain, +constipation-resolved  Objective: Vital Signs: Blood pressure 113/69, pulse 65, temperature 98.1 F (36.7 C), resp. rate 14, height 5\' 2"  (1.575 m), weight 59.5 kg, SpO2 99 %. DG Abd 1 View  Result Date: 06/17/2021 CLINICAL DATA:  Constipation. EXAM: ABDOMEN - 1 VIEW COMPARISON:  None. FINDINGS: There is mild-to-moderate amount of stool mixed with contrast throughout the colon. No bowel dilatation or evidence of obstruction. Colonic diverticula noted. No free air. Degenerative changes of the spine. No acute osseous pathology. IMPRESSION: 1. No bowel obstruction. 2. Colonic diverticulosis. Electronically Signed   By: Anner Crete M.D.   On: 06/17/2021 19:17   Recent Labs    06/18/21 0504 06/19/21 0505  WBC 15.5* 14.4*  HGB 12.2 12.0  HCT 35.7* 35.4*  PLT 460* 472*   Recent Labs    06/17/21 1451  NA 137  K 3.5  CL 107  CO2 21*  GLUCOSE 103*  BUN 18  CREATININE 0.69  CALCIUM 9.1     Intake/Output Summary (Last 24 hours) at 06/19/2021 1225 Last data filed at 06/19/2021 0831 Gross per 24 hour  Intake 0 ml  Output --  Net 0 ml         Physical Exam: BP 113/69 (BP Location: Left Arm)   Pulse 65   Temp 98.1 F (36.7 C)   Resp 14   Ht 5\' 2"  (1.575 m)   Wt 59.5 kg   SpO2 99%   BMI 23.99 kg/m  Constitutional: No distress . Vital signs reviewed. Appears uncomfortable due to constipation. BMI 23.99 HENT: Normocephalic.  Atraumatic. Eyes: EOMI. No discharge. Cardiovascular: No JVD.  Irregularly irregular Respiratory: Normal effort.  No stridor.  Bilateral clear to auscultation. GI: Non-distended.  BS +. Skin: Warm and dry.  Intact. Psych: Unable to assess due to mentation Musc: No edema in extremities.  No tenderness in  extremities. Tenderness to palpation of lower back Neuro: Alert Global aphasia Nods yes to all questions, tries to say her son's name.  Unable to point or follow motor commands without tactile cues. Can follow 1-step commands Right facial weakness.  Motor: Spontaneously moves all extremities, >/3/5 throughout, unchanged  Assessment/Plan: 1. Functional deficits which require 3+ hours per day of interdisciplinary therapy in a comprehensive inpatient rehab setting. Physiatrist is providing close team supervision and 24 hour management of active medical problems listed below. Physiatrist and rehab team continue to assess barriers to discharge/monitor patient progress toward functional and medical goals   Care Tool:  Bathing    Body parts bathed by patient: Right arm, Left arm, Chest, Abdomen, Face, Right upper leg, Left upper leg   Body parts bathed by helper: Front perineal area, Buttocks, Right lower leg, Left lower leg     Bathing assist Assist Level: Maximal Assistance - Patient 24 - 49%     Upper Body Dressing/Undressing Upper body dressing   What is the patient wearing?: Pull over shirt    Upper body assist Assist Level: Supervision/Verbal cueing    Lower Body Dressing/Undressing Lower body dressing      What is the patient wearing?: Pants, Incontinence brief     Lower body assist Assist for lower body dressing: Moderate Assistance - Patient 50 - 74%     Toileting Toileting    Toileting assist Assist for toileting: Maximal Assistance -  Patient 25 - 49%     Transfers Chair/bed transfer  Transfers assist     Chair/bed transfer assist level: Contact Guard/Touching assist     Locomotion Ambulation   Ambulation assist   Ambulation activity did not occur: Safety/medical concerns  Assist level: Contact Guard/Touching assist Assistive device: Hand held assist Max distance: 15   Walk 10 feet activity   Assist     Assist level: Contact Guard/Touching  assist Assistive device: Hand held assist   Walk 50 feet activity   Assist Walk 50 feet with 2 turns activity did not occur: Safety/medical concerns  Assist level: Contact Guard/Touching assist Assistive device: Walker-rolling    Walk 150 feet activity   Assist Walk 150 feet activity did not occur: Safety/medical concerns         Walk 10 feet on uneven surface  activity   Assist Walk 10 feet on uneven surfaces activity did not occur: Safety/medical concerns         Wheelchair     Assist Is the patient using a wheelchair?: Yes   Wheelchair activity did not occur: Safety/medical concerns  Wheelchair assist level: Dependent - Patient 0% (patient unable to follow-mulimodal cues for w/c propulsion) Max wheelchair distance: >150 ft    Wheelchair 50 feet with 2 turns activity    Assist        Assist Level: Dependent - Patient 0%   Wheelchair 150 feet activity     Assist      Assist Level: Dependent - Patient 0%    Medical Problem List and Plan: 1. 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  B/C vitamins added to aid stroke recovery  Continue CIR 2.  Impaired mobility: 70-90 feet: start eliquis 5mg  BID             -antiplatelet therapy:  ASA daily 3. Low back pain: kpad added. Continue scheduled Tylenol 650mg  TID. LFTs reviewed and normal 4. Mood: LCSW to follow for evaluation and support.              -antipsychotic agents: N/A 5. Neuropsych: This patient is not capable of making decisions on her own behalf. 6. Skin/Wound Care: Routine pressure relief measures.  7. Fluids/Electrolytes/Nutrition: Monitor I/Os. 8. A fib: Will monitor BP TID--toprolol resumed after discussion with pharmacy, continue, well controlled             Monitor with increased activity 9. H/o Migraines: Was on sumatriptan PTA.               Monitor for increase in migraines,  especially in association with vascular headaches 10. Leucocytosis: Monitor for fevers and other signs of infection.              WBC 16.1 on 12/4, continue to monitor, repeat tomorrow.  Afebrile  No signs/symptoms of infection  11. Urinary retention? Incontinence?: continuing voiding trial. 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  Dark-colored stools  Hemoccult ordered, CBC stable, likely secondary to constipation 14. Decreased appetite: megace started.  15. Constipation: resolved with GoLytely, continue milk of mag 32mL daily  LOS: 6 days A FACE TO FACE EVALUATION WAS PERFORMED  Martha Clan P Kimball Manske 06/19/2021, 12:25 PM

## 2021-06-19 NOTE — Discharge Instructions (Addendum)
Inpatient Rehab Discharge Instructions  Mckenzie Guerrero Discharge date and time: 06/30/21   Activities/Precautions/ Functional Status: Activity: no lifting, driving, or strenuous exercise till cleared by MD Diet:  Pureed foods. Needs to be upright for meals Wound Care: none needed   Functional status:  ___ No restrictions     ___ Walk up steps independently _X__ 24/7 supervision/assistance   ___ Walk up steps with assistance ___ Intermittent supervision/assistance  ___ Bathe/dress independently ___ Walk with walker     ___ Bathe/dress with assistance ___ Walk Independently    ___ Shower independently _X__ Walk with supervision    _X__ Shower with assistance _X__ No alcohol     ___ Return to work/school ________   Special Instructions: Need to toilet patient every 4 hours while awake. Monitor bowel regimen--need to take miralax daily and increase to twice a day if needed. Use milk of magnesia also. Need to make sure she has a BM daily. Adjust laxative as needed to make sure that patient does not get constipated. 3. Lidocaine patches are available over the counter.    COMMUNITY REFERRALS UPON DISCHARGE:    Outpatient: PT  OT  SP             Agency:[facility]  [phone]              Appointment Date/Time:Will call Lennette Bihari to set up follow up appointments  Medical Equipment/Items Ordered: HAS EQUIPMENT AT [facility] SON                                                 Private duty list given to son's to pursue hired assist   [place] Cigarette smoking nearly doubles your risk of having a stroke & is the single most alterable risk factor  If you smoke or have smoked in the last 12 months, you are advised to quit smoking for your health. Most of the excess cardiovascular risk related to smoking disappears within a year of stopping. Ask you doctor about anti-smoking medications [place] Quit Line: 1-800-QUIT NOW Free Smoking  Cessation Classes [phone]  CHOLESTEROL Know your levels; limit fat & cholesterol in your diet  Lipid Panel     Component Value Date/Time   CHOL 200 06/09/2021 0433   TRIG 75 06/09/2021 0433   HDL 46 06/09/2021 0433   CHOLHDL 4.3 06/09/2021 0433   VLDL 15 06/09/2021 0433   LDLCALC 139 (H) 06/09/2021 0433     Many patients benefit from treatment even if their cholesterol is at goal. Goal: Total Cholesterol (CHOL) less than 160 Goal:  Triglycerides (TRIG) less than 150 Goal:  HDL greater than 40 Goal:  LDL (LDLCALC) less than 100   BLOOD PRESSURE American Stroke Association blood pressure target is less that 120/80 mm/Hg  Your discharge blood pressure is:  BP: 110/65 Monitor your blood pressure Limit your salt and alcohol intake Many individuals will require more than one medication for high blood pressure  DIABETES (A1c is a blood sugar average for last 3 months) Goal HGBA1c is under 7% (HBGA1c is blood sugar average for last 3 months)  Diabetes: Pre-diabetic     Lab Results  Component Value Date   HGBA1C 5.8 (H) 06/09/2021    Your HGBA1c can be lowered with medications, healthy diet, and exercise. Check your blood sugar as directed  by your physician Call your physician if you experience unexplained or low blood sugars.  PHYSICAL ACTIVITY/REHABILITATION Goal is 30 minutes at least 4 days per week  Activity: Increase activity slowly, and No driving, Therapies: see above Return to work: N/A Activity decreases your risk of heart attack and stroke and makes your heart stronger.  It helps control your weight and blood pressure; helps you relax and can improve your mood. Participate in a regular exercise program. Talk with your doctor about the best form of exercise for you (dancing, walking, swimming, cycling).  DIET/WEIGHT Goal is to maintain a healthy weight  Your discharge diet is:  Diet Order             DIET - DYS 1 Room service appropriate? Yes; Fluid consistency:  Thin  Diet effective now                   liquids Your height is:  Height: 5\' 2"  (157.5 cm) Your current weight is: Weight: 57.3 kg Your Body Mass Index (BMI) is:  BMI (Calculated): 23.1 Following the type of diet specifically designed for you will help prevent another stroke. You are at goal weight our goal Body Mass Index (BMI) is 19-24. Healthy food habits can help reduce 3 risk factors for stroke:  High cholesterol, hypertension, and excess weight.  RESOURCES Stroke/Support Group:  Call [phone]   STROKE EDUCATION PROVIDED/REVIEWED AND GIVEN TO PATIENT Stroke warning signs and symptoms How to activate emergency medical system (call 911). Medications prescribed at discharge. Need for follow-up after discharge. Personal risk factors for stroke. Pneumonia vaccine given:  Flu vaccine given:  My questions have been answered, the writing is legible, and I understand these instructions.  I will adhere to these goals & educational materials that have been provided to me after my discharge from the hospital.      My questions have been answered and I understand these instructions. I will adhere to these goals and the provided educational materials after my discharge from the hospital.  Patient/Caregiver Signature _______________________________ Date __________  Clinician Signature _______________________________________ Date __________  Please bring this form and your medication list with you to all your follow-up doctor's appointments.  Information on my medicine - ELIQUIS (apixaban)  This medication education was reviewed with me or my healthcare representative as part of my discharge preparation.   Why was Eliquis prescribed for you? Eliquis was prescribed for you to reduce the risk of a blood clot forming that can cause a stroke if you have a medical condition called atrial fibrillation (a type of irregular heartbeat).  What do You need to know about Eliquis ? Take  your Eliquis TWICE DAILY - one tablet in the morning and one tablet in the evening with or without food. If you have difficulty swallowing the tablet whole please discuss with your pharmacist how to take the medication safely.  Take Eliquis exactly as prescribed by your doctor and DO NOT stop taking Eliquis without talking to the doctor who prescribed the medication.  Stopping may increase your risk of developing a stroke.  Refill your prescription before you run out.  After discharge, you should have regular check-up appointments with your healthcare provider that is prescribing your Eliquis.  In the future your dose may need to be changed if your kidney function or weight changes by a significant amount or as you get older.  What do you do if you miss a dose? If you miss a dose, take it  as soon as you remember on the same day and resume taking twice daily.  Do not take more than one dose of ELIQUIS at the same time to make up a missed dose.  Important Safety Information A possible side effect of Eliquis is bleeding. You should call your healthcare provider right away if you experience any of the following: Bleeding from an injury or your nose that does not stop. Unusual colored urine (red or dark brown) or unusual colored stools (red or black). Unusual bruising for unknown reasons. A serious fall or if you hit your head (even if there is no bleeding).  Some medicines may interact with Eliquis and might increase your risk of bleeding or clotting while on Eliquis. To help avoid this, consult your healthcare provider or pharmacist prior to using any new prescription or non-prescription medications, including herbals, vitamins, non-steroidal anti-inflammatory drugs (NSAIDs) and supplements.  This website has more information on Eliquis (apixaban): http://[email or web address].com/eliquis/home

## 2021-06-19 NOTE — Progress Notes (Signed)
Physical Therapy Session Note  Patient Details  Name: Mckenzie Guerrero MRN: [id number] Date of Birth: 1950-11-12  Today's Date: 06/19/2021 PT Individual Time: 0800-0829 PT Individual Time Calculation (min): 29 min   Short Term Goals: Week 1:  PT Short Term Goal 1 (Week 1): Patient will perform basic transfers with CGA using LRAD. PT Short Term Goal 2 (Week 1): Patient will ambulate >150 ft using LRAD with min A. PT Short Term Goal 3 (Week 1): Patient will improved Berg Balance Scale by MCID (8 points) PT Short Term Goal 4 (Week 1): Patient will ascend/descend 12 step with min A using 1 rail to simulate home set-up.  Skilled Therapeutic Interventions/Progress Updates: Pt presents supine in bed, non-verbal, but does follow 1 step directions.  Pt required doff brief 2/2 incontinent of stool.  NT present and will chart.  Pt rolled side to side w/ total A for pericare.  Pt transferred to EOB w/ supervision and then donned pants w/ set-up, total A for donning grippy slippers.  Pt stood and pulled up pants w/ CGA.  Pt amb to BR w/ HHA and CGA for doff pants/brief (which were soiled w/ urine) and for stand to sit at toilet.  Pt very impulsive.  Pt attempted to clean self but PT required total A to complete for cleanliness.  Pt attempting to reach around into butt.  Pt pulled up clean brief and pants w/ CGA/min A.  Pt amb to sink for handwashing.  Pt required manual cueing to reach for soap, but then washes it off in water w/o cleaning.  PT again assisted to get soap and washed hand, although attempted to put soap on face.  Pt stepped to bed and attempted to comb hair in sitting.  Pt then transfers back to supine w/ scooting to [facility] and then to supine w/ supervision.  Bed alarm on and all needs in reach.     Therapy Documentation Precautions:  Precautions Precautions: Fall Precaution Comments: Pt with recent R hernia repair R inattention, aphasic, impulsive Restrictions Weight Bearing Restrictions:  No General:   Vital Signs: Therapy Vitals Temp: 98.1 F (36.7 C) Pulse Rate: 65 Resp: 14 BP: 113/69 Patient Position (if appropriate): Lying Oxygen Therapy SpO2: 99 % O2 Device: Room Air Pain:appears pain-free.        Therapy/Group: Individual Therapy  Ladoris Gene 06/19/2021, 8:29 AM

## 2021-06-19 NOTE — Progress Notes (Signed)
Speech Language Pathology Daily Session Note  Patient Details  Name: Mckenzie Guerrero MRN: [id number] Date of Birth: Nov 10, 1950  Today's Date: 06/19/2021 SLP Individual Time: 1100-1145 SLP Individual Time Calculation (min): 45 min  Short Term Goals: Week 1: SLP Short Term Goal 1 (Week 1): Patient will consume current diet with min-modA cues for awareness to and clearing of oral residuals and without overt s/sx of aspiration SLP Short Term Goal 2 (Week 1): Patient will answer basic yes/no questions with 50% accuracy via multimodal communication with max A verbal and visual cues SLP Short Term Goal 3 (Week 1): Patient will imitate to perform gestures/signs to indicate basic wants/needs with maxA verbal, visual and tactile cues. SLP Short Term Goal 4 (Week 1): Patient will follow basic one-step verbal directions with max A verbal/visual/tactile cues and additional processing time SLP Short Term Goal 5 (Week 1): Patient will elicit intentional vocalizations during structured or non-structured speech tasks with max A verbal/visual/tactile cues  Skilled Therapeutic Interventions:   Patient seen with sons present in room for skilled ST session focusing on dysphagia and aphasia goals. Of note, patient smiling at times, not appearing in discomfort as she has been and participated fully. She consumed water via cup sips, exhibiting anterior spillage each time, but then able to sip through straw without spillage and without overt s/s aspiration or penetration. She participated in object identification task with SLP presenting two objects on table in front of her and naming object for her to point to/pick up. Initially, patient only choosing objects on left side but after numerous trials, patient did start to improve with her accuracy (approximately 60% with same 6 objects). SLP then transitioned to pictures and patient was approximately 50% accurate with pointing to pictures when named. She was not consistent  with matching object pictures to words or pointing to object words in field of two. When given dry erase marker and instructed to write name, she wrote, "TRERS" and even with hand over hand, patient trying to write same letter combination. During hand over hand for pointing to body parts on face, patient started to appear fatigued or frustrated and reached for SLP's hands, seeming to want to stop. SLP left patient in bed with family members in room. She continues to benefit from skilled SLP intervention to maximize cognitive-linguistic and swallow function goals prior to discharge.  Pain Pain Assessment Pain Scale: Faces Pain Score: 0-No pain  Therapy/Group: Individual Therapy  Sonia Baller, MA, CCC-SLP Speech Therapy

## 2021-06-19 NOTE — Progress Notes (Signed)
Physical Therapy Session Note  Patient Details  Name: Mckenzie Guerrero MRN: [id number] Date of Birth: 11/13/1950  Today's Date: 06/19/2021 PT Individual Time: 0906-1010 PT Individual Time Calculation (min): 64 min   Short Term Goals: Week 1:  PT Short Term Goal 1 (Week 1): Patient will perform basic transfers with CGA using LRAD. PT Short Term Goal 2 (Week 1): Patient will ambulate >150 ft using LRAD with min A. PT Short Term Goal 3 (Week 1): Patient will improved Berg Balance Scale by MCID (8 points) PT Short Term Goal 4 (Week 1): Patient will ascend/descend 12 step with min A using 1 rail to simulate home set-up.  Skilled Therapeutic Interventions/Progress Updates:  Patient supine in bed on entrance to room. Husband and son present. Patient alert and agreeable to PT session. Severe aphasia prevents full understanding and ability to relate pt's status but appears to be fatigued with no signs of distress. Pt demos ability to follow instructions correctly ~50% of session.   Patient demonstrating no s/s of pain throughout session.  Therapeutic Activity: Bed Mobility: Patient performed supine --> sit with CGA/ MinA for pushing UB to upright seated position. At end of session, pt is able to return to supine with supervision, but requires CGA/ MinA to complete positioning into neutral body position in bed. VC/ tc required throughout for balance/ safety. Transfers: Patient performed sit<>stand and stand pivot transfers throughout session with impulsive nature and supevision. Provided verbal cues for following instructions, safety awareness, technique. Pt performs two toilet transfers throughout session and is able to complete transfer with CGA. Pt requires toileting for UI, continence and BM which is indicated by grabbing of glute -- this is pointed out by husband during ambulation. MinA provided for clothing mgmt. Requires setup assist for attempt at pericare. Max A for donning of new brief. Pt    Gait Training:  Patient ambulated 165' x1 using no AD with CGA/MinA. Demonstrated low step height and decreased safety with crossover stepping in making complete turns. Pt requested to find different Christmas related decorations around the unit on walk and pt unable to correctly find any and also turns wrong direction when requested verbally only.  Provided vc/ tc for increasing step height, level gaze. Husband providing +2 for w/c follow during initial bouts. Pt then ambulates 200' x2 to therapy gym and back with CGA/ MinA for balance and requiring vc for directionality, increasing step height, level gaze.   Neuromuscular Re-ed: NMR facilitated during session with focus on standing balance, recognition of familiar items, proprioception, visual field. Pt guided in card matching while standing at board. Cards placed to pt's L side requiring either dynamic step or short ambulation. Pt requires CGA for balance throughout. Is only ~15% accurate and is not attentive to cards held in R hand.  NMR performed for improvements in motor control and coordination, balance, sequencing, judgement, and self confidence/ efficacy in performing all aspects of mobility at highest level of independence.   Patient supine  in bed at end of session with brakes locked, bed alarm set, and all needs within reach. Husband and son in room.     Therapy Documentation Precautions:  Precautions Precautions: Fall Precaution Comments: Pt with recent R hernia repair R inattention, aphasic, impulsive Restrictions Weight Bearing Restrictions: No General:   Vital Signs:   Pain:  No pain complaint this session.   Therapy/Group: Individual Therapy  Alger Simons PT, DPT 06/19/2021, 10:19 AM

## 2021-06-19 NOTE — Progress Notes (Signed)
Patient has been cleared by neurology services to begin Eliquis

## 2021-06-20 DIAGNOSIS — I63512 Cerebral infarction due to unspecified occlusion or stenosis of left middle cerebral artery: Secondary | ICD-10-CM | POA: Diagnosis not present

## 2021-06-20 NOTE — Progress Notes (Signed)
[place] PHYSICAL MEDICINE & REHABILITATION PROGRESS NOTE  Subjective/Complaints:  Pt kept nodding head no or yes, but would say the opposite, so cannot tell what's she wants to say.  Dentures not fitting per NT- eating D1 thin diet- ate 50% of tray so far.   4 Bms yesterday- small to medium- liquid mainly- will back off bowel meds slightly.    ROS: limited due to aphasia/cognition  Objective: Vital Signs: Blood pressure (!) 107/59, pulse 62, temperature 97.8 F (36.6 C), temperature source Oral, resp. rate 16, height 5\' 2"  (1.575 m), weight 59.5 kg, SpO2 97 %. No results found. Recent Labs    06/18/21 0504 06/19/21 0505  WBC 15.5* 14.4*  HGB 12.2 12.0  HCT 35.7* 35.4*  PLT 460* 472*   Recent Labs    06/17/21 1451  NA 137  K 3.5  CL 107  CO2 21*  GLUCOSE 103*  BUN 18  CREATININE 0.69  CALCIUM 9.1     Intake/Output Summary (Last 24 hours) at 06/20/2021 1052 Last data filed at 06/20/2021 0900 Gross per 24 hour  Intake 354 ml  Output --  Net 354 ml         Physical Exam: BP (!) 107/59 (BP Location: Left Arm)   Pulse 62   Temp 97.8 F (36.6 C) (Oral)   Resp 16   Ht 5\' 2"  (1.575 m)   Wt 59.5 kg   SpO2 97%   BMI 23.99 kg/m     General: awake, alert, eating D1 diet with NT in room; dentures don't fit- on plate;  NAD HENT: conjugate gaze; oropharynx moist CV: regular rate but in afib; no JVD Pulmonary: CTA B/L; no W/R/R- good air movement GI: soft, NT, ND, (+)BS- hyperactive Psychiatric: aphasic- flat Neurological: aphasic; apraxic Skin: Warm and dry.  Intact. Musc: No edema in extremities.  No tenderness in extremities. Tenderness to palpation of lower back Neuro: Alert Global aphasia Nods yes to all questions, tries to say her son's name.  Unable to point or follow motor commands without tactile cues. Can follow 1-step commands Right facial weakness.  Motor: Spontaneously moves all extremities, >/3/5 throughout,  unchanged  Assessment/Plan: 1. Functional deficits which require 3+ hours per day of interdisciplinary therapy in a comprehensive inpatient rehab setting. Physiatrist is providing close team supervision and 24 hour management of active medical problems listed below. Physiatrist and rehab team continue to assess barriers to discharge/monitor patient progress toward functional and medical goals   Care Tool:  Bathing    Body parts bathed by patient: Right arm, Left arm, Chest, Abdomen, Face, Right upper leg, Left upper leg   Body parts bathed by helper: Front perineal area, Buttocks, Right lower leg, Left lower leg     Bathing assist Assist Level: Maximal Assistance - Patient 24 - 49%     Upper Body Dressing/Undressing Upper body dressing   What is the patient wearing?: Pull over shirt    Upper body assist Assist Level: Supervision/Verbal cueing    Lower Body Dressing/Undressing Lower body dressing      What is the patient wearing?: Pants, Incontinence brief     Lower body assist Assist for lower body dressing: Moderate Assistance - Patient 50 - 74%     Toileting Toileting    Toileting assist Assist for toileting: Maximal Assistance - Patient 25 - 49%     Transfers Chair/bed transfer  Transfers assist     Chair/bed transfer assist level: Contact Guard/Touching assist     Locomotion  Ambulation   Ambulation assist   Ambulation activity did not occur: Safety/medical concerns  Assist level: Contact Guard/Touching assist Assistive device: Hand held assist Max distance: 15   Walk 10 feet activity   Assist     Assist level: Contact Guard/Touching assist Assistive device: Hand held assist   Walk 50 feet activity   Assist Walk 50 feet with 2 turns activity did not occur: Safety/medical concerns  Assist level: Contact Guard/Touching assist Assistive device: Walker-rolling    Walk 150 feet activity   Assist Walk 150 feet activity did not occur:  Safety/medical concerns         Walk 10 feet on uneven surface  activity   Assist Walk 10 feet on uneven surfaces activity did not occur: Safety/medical concerns         Wheelchair     Assist Is the patient using a wheelchair?: Yes   Wheelchair activity did not occur: Safety/medical concerns  Wheelchair assist level: Dependent - Patient 0% (patient unable to follow-mulimodal cues for w/c propulsion) Max wheelchair distance: >150 ft    Wheelchair 50 feet with 2 turns activity    Assist        Assist Level: Dependent - Patient 0%   Wheelchair 150 feet activity     Assist      Assist Level: Dependent - Patient 0%    Medical Problem List and Plan: 1. 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  B/C vitamins added to aid stroke recovery  Continue CIR- PT, OT and SLP  2.  Impaired mobility: 70-90 feet: start eliquis 5mg  BID             -antiplatelet therapy:  ASA daily 3. Low back pain: kpad added. Continue scheduled Tylenol 650mg  TID. LFTs reviewed and normal 4. Mood: LCSW to follow for evaluation and support.              -antipsychotic agents: N/A 5. Neuropsych: This patient is not capable of making decisions on her own behalf. 6. Skin/Wound Care: Routine pressure relief measures.  7. Fluids/Electrolytes/Nutrition: Monitor I/Os. 8. A fib: Will monitor BP TID--toprolol resumed after discussion with pharmacy, continue, well controlled  12/10- HR controlled/rate controlled- con't regimen             Monitor with increased activity 9. H/o Migraines: Was on sumatriptan PTA.               Monitor for increase in migraines, especially in association with vascular headaches 10. Leucocytosis: Monitor for fevers and other signs of infection.              WBC 16.1 on 12/4, continue to monitor, repeat tomorrow.  Afebrile  No signs/symptoms of infection  11. Urinary  retention? Incontinence?: continuing voiding trial.  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  Dark-colored stools  Hemoccult ordered, CBC stable, likely secondary to constipation 14. Decreased appetite: megace started.  15. Constipation: resolved with GoLytely, continue milk of mag 55mL daily  12/10- 4 liquid stools yesterday because had been constipated- will maintain bowel meds for now and monitor  LOS: 7 days A FACE TO FACE EVALUATION WAS PERFORMED  Mckenzie Guerrero 06/20/2021, 10:52 AM

## 2021-06-20 NOTE — Progress Notes (Signed)
This nurse went to administer 8:00am Medications to patient. Patient takes meds crushed in apple sauce and when nurse went to administer medications, patient kept saying no and and nodding head no. Nurse asked patient three times and explained the importance of her medications. Patient only took oral suspensions.

## 2021-06-21 NOTE — Progress Notes (Signed)
Speech Language Pathology Daily Session Note  Patient Details  Name: Mckenzie Guerrero MRN: [id number] Date of Birth: 02-25-1951  Today's Date: 06/21/2021 SLP Individual Time: 9629-5284 SLP Individual Time Calculation (min): 45 min  Short Term Goals: Week 1: SLP Short Term Goal 1 (Week 1): Patient will consume current diet with min-modA cues for awareness to and clearing of oral residuals and without overt s/sx of aspiration SLP Short Term Goal 2 (Week 1): Patient will answer basic yes/no questions with 50% accuracy via multimodal communication with max A verbal and visual cues SLP Short Term Goal 3 (Week 1): Patient will imitate to perform gestures/signs to indicate basic wants/needs with maxA verbal, visual and tactile cues. SLP Short Term Goal 4 (Week 1): Patient will follow basic one-step verbal directions with max A verbal/visual/tactile cues and additional processing time SLP Short Term Goal 5 (Week 1): Patient will elicit intentional vocalizations during structured or non-structured speech tasks with max A verbal/visual/tactile cues  Skilled Therapeutic Interventions:   Pt seen for skilled ST with focus on language goals. While pt was sitting up on bed, she began fidgeting and grimacing. SLP attempted to clarify if pt patient was in pain via simple yes-no questions; however, pt was choosing no and continued to grimace. SLP assisted pt in standing up. Pt went to edge of bed and lifted leg to stretch (with SLP monitoring with gait belt). This appeared to help, and SLP assisted pt back to EOB. SLP began with encouraging pt to follow simple, 1-step directions. Pt was able to copy SLPs circle with initial hand-over-hand and then independently with SLP cue to (cirrrrcle and stop). This was successful for patient. Pt was inaccurate answering basic, biographical details (indicating yes for no, and no for yes). She spontaneously verbalized (yes x3 and no x1) during today's session. Pt was unable to  follow 1-step directions given max multimodal cueing. When singing "jingle bells", but was able to vocalize for entirety of song. She produced the "HEY" at the end with SLP. Pt was left in bed, bed alarm activated and all immediate needs within reach. Cont with current POC.    Pain Pain Assessment Pain Scale: Faces Pain Descriptors / Indicators: Discomfort;Grimacing Pain Intervention(s): Repositioned PAINAD (Pain Assessment in Advanced Dementia) Breathing: normal Negative Vocalization: occasional moan/groan, low speech, negative/disapproving quality Facial Expression: sad, frightened, frown Body Language: tense, distressed pacing, fidgeting Consolability: distracted or reassured by voice/touch PAINAD Score: 4 Critical Care Pain Observation Tool (CPOT) Facial Expression: Grimacing Body Movements: Restlessness Muscle Tension: Relaxed Vocalization (extubated pts.): Talking in normal tone or no sound  Therapy/Group: Individual Therapy  Verdene Lennert, SLP  06/21/2021, 12:55 PM

## 2021-06-22 DIAGNOSIS — I63512 Cerebral infarction due to unspecified occlusion or stenosis of left middle cerebral artery: Secondary | ICD-10-CM | POA: Diagnosis not present

## 2021-06-22 LAB — BASIC METABOLIC PANEL
Anion gap: 10 (ref 5–15)
BUN: 16 mg/dL (ref 8–23)
CO2: 22 mmol/L (ref 22–32)
Calcium: 9.2 mg/dL (ref 8.9–10.3)
Chloride: 108 mmol/L (ref 98–111)
Creatinine, Ser: 0.73 mg/dL (ref 0.44–1.00)
GFR, Estimated: >60 mL/min (ref >60)
Glucose, Bld: 97 mg/dL (ref 70–99)
Potassium: 4.1 mmol/L (ref 3.5–5.1)
Sodium: 140 mmol/L (ref 135–145)

## 2021-06-22 NOTE — Progress Notes (Signed)
Speech Language Pathology Weekly Progress and Session Note  Patient Details  Name: Mckenzie Guerrero MRN: [id number] Date of Birth: 31-May-1951  Beginning of progress report period: June 15, 2021 End of progress report period: June 22, 2021  Today's Date: 06/22/2021 SLP Individual Time: 0347-4259 SLP Individual Time Calculation (min): 43 min  Short Term Goals: Week 1: SLP Short Term Goal 1 (Week 1): Patient will consume current diet with min-modA cues for awareness to and clearing of oral residuals and without overt s/sx of aspiration SLP Short Term Goal 1 - Progress (Week 1): Met SLP Short Term Goal 2 (Week 1): Patient will answer basic yes/no questions with 50% accuracy via multimodal communication with max A verbal and visual cues SLP Short Term Goal 2 - Progress (Week 1): Not met SLP Short Term Goal 3 (Week 1): Patient will imitate to perform gestures/signs to indicate basic wants/needs with maxA verbal, visual and tactile cues. SLP Short Term Goal 3 - Progress (Week 1): Not met SLP Short Term Goal 4 (Week 1): Patient will follow basic one-step verbal directions with max A verbal/visual/tactile cues and additional processing time SLP Short Term Goal 4 - Progress (Week 1): Progressing toward goal SLP Short Term Goal 5 (Week 1): Patient will elicit intentional vocalizations during structured or non-structured speech tasks with max A verbal/visual/tactile cues SLP Short Term Goal 5 - Progress (Week 1): Met   New Short Term Goals: Week 2: SLP Short Term Goal 1 (Week 2): STG = LTG dt/ ELOS  Weekly Progress Updates: Pt has made inconsistent gains this week having met 2 out of 5 short term goals for swallowing and language. Pt progress impacted by pain, agitation and decreased awareness. Pt is currently tolerating a Dys 1 diet/thin liquid with no s/s aspiration or significant oral stasis. Pt has minimally participated in trials of Dys 2 (2-3 small bites before refusing any further)  making diet advancement inappropriate at this time. Pt expressive language has minimally improved and pt is currently producing inconsistent, simple vocalizations "yea" "uh-huh". Pt is unable to consistently answer basic yes/no questions despite max A, is inconsistently able to follow simple one step commands during ADLs but unable to follow 1-step commands during structured language tasks. Family has been present for 1 ST session but will benefit from ongoing education and training on swallow safety and communication strategies. Recommend 24/7 care and f/u ST services at next level. Pt will benefit from ongoing ST services in CIR to increase communication, language and swallow function.   Intensity: Minumum of 1-2 x/day, 30 to 90 minutes Frequency: 3 to 5 out of 7 days Duration/Length of Stay: 12/20 Treatment/Interventions: Cognitive remediation/compensation;Environmental controls;Internal/external aids;Multimodal communication approach;Speech/Language facilitation;Cueing hierarchy;Dysphagia/aspiration precaution training;Functional tasks;Patient/family education;Therapeutic Activities   Daily Session  Skilled Therapeutic Interventions: Pt seen for skilled ST with focus on swallowing and language goals. Report from NT that patient was moved rooms due to agitation and disagreeable to belt and chair alarms. When SLP entered room, nursing present for med pass which pt was not agreeable to despite holding different parts of her body and apparently grimacing in pain. Offered to trial whole pills in puree but pt shaking head no. SLP attempting to engage patient in various language tasks to assess for current function d/t weekly progress note due. Pt unable to follow 1-step directions in structured tasks, however able to follow with increased accuracy during mobility or routine tasks. Pt responding to yes/no questions 0% of the time. Pt with occasional "uh huh" when SLP  providing education/explanation. Pt  agreeable to Dys 2 trial snack, consuming 3 very small bites before refusing further intake. No noted difficulty or s/s aspiration. Pt left in bed with alarm set, all bed rails up and all needs within reach. Cont ST POC.     General    Pain Pain Assessment Pain Scale: 0-10 Pain Score: 0-No pain  Therapy/Group: Individual Therapy  Dewaine Conger 06/22/2021, 3:04 PM

## 2021-06-22 NOTE — Progress Notes (Signed)
Physical Therapy Weekly Progress Note  Patient Details  Name: Mckenzie Guerrero MRN: [id number] Date of Birth: 1951/06/11  Beginning of progress report period: June 14, 2021 End of progress report period: June 22, 2021  Today's Date: 06/22/2021 PT Individual Time: 4097-3532 PT Individual Time Calculation (min): 54 min   Patient has met 3 of 4 short term goals. Pt making appropriate progress towards goals although therapeutic interventions were limited during the 1st week 2/2 bowel complications. Overall, she's supervision for bed mobility, CGA for transfers, CGA/minA for gait 112f with no AD, and requires minA for navigating 12 steps with 2 rails. She continues to be primarily limited by R inattention, significant aphasia (expressive > receptive), and decreased dynamic standing balance.   Patient continues to demonstrate the following deficits muscle weakness, decreased cardiorespiratoy endurance, unbalanced muscle activation, decreased coordination, and decreased motor planning, decreased visual perceptual skills, decreased attention to right and decreased motor planning, decreased initiation, decreased attention, decreased awareness, decreased problem solving, decreased safety awareness, decreased memory, and delayed processing, and decreased standing balance, decreased postural control, hemiplegia, and decreased balance strategies and therefore will continue to benefit from skilled PT intervention to increase functional independence with mobility.  Patient progressing toward long term goals.  Continue plan of care.  PT Short Term Goals Week 2:  PT Short Term Goal 1 (Week 2): STG = LTG due to ELOS  Skilled Therapeutic Interventions/Progress Updates:     Pt sitting up in recliner at start of session - awake and appears agreeable to PT tx. Her significant aphasia impacting communication. She now has more vocalizations compared to prior sessions, repeating "okay" and "yeah", always  nodding her head with questions or conversations. She responds this way to 100% of questions asked with both yes/no questions or with open ended questions. Responds well to gestures, pointing, and demonstrations during PT tx.  Sit<>stand from recliner with no AD and CGA. Ambulates >1737fwith CGA and no AD from her room to ADL apartment suite. Gait deficits are primarily decreased speed with increased lateral unsteadiness with x2 instances of LOB that requires light minA for corrections.   Completed furniture transfers in ADL apartment from low sitting sofa couchs with CGA and no AD. In Kitchen, instructed in reaching for items in cabinets with CGA and attempted to instruct in preparing microwave meal but unable to understand verbal instructions due to aphasia.   NMR with BITS system in standing with CGA and no AD - user paced target reaching, maze test. Some mild perseveration during these tasks and also distractibility from her reflection in BITS system. Demonstrated appropriate integration of RUE during task but did have some increased delay on R side compared to L. Overall reaction time was 2.14sec with 35.26% accuracy during target reaching. With Maze test, lacked organizational thinking and sequential problem solving - would simply draw horizontal lines across maze, even after providing visual demonstrations.    Instructed pt in dynamic standing balance task while standing on compliant blue aire-ex foam pad while unsupported, tossing horseshoes ~1021fway to target. Required minA for balance while tossing and reaching for horseshoes. She also assisted in picking horseshoes up from floor in standing with minA guard - did well integrating both UE 's to assist.   Suspected pt to be incontinent of BM due to smell. Ambulated back to room with CGA/minA and no AD with similar deficits as above. Gait speed 0.54 m/s indicative of Limited [employer] increased falls risk.  Pt assisted in bathroom  where she was noted to be incontinent of small smear BM. She was continent of bladder once on toilet and continent of further BM also. Able to perform posterior and frontal perciare with setupA while standing with some poor awareness of cleanliness that required assist for thoroughness. Also required assist for thoroughness with hand washing at the sink.  Pt completed session seated in recliner with safety belt alarm on and all immediate needs within reach. NT made aware of in/out toileting.   Therapy Documentation Precautions:  Precautions Precautions: Fall Precaution Comments: Pt with recent R hernia repair R inattention, aphasic, impulsive Restrictions Weight Bearing Restrictions: No General:    Therapy/Group: Individual Therapy  Glorene Leitzke P Taccara Bushnell PT  06/22/2021, 7:28 AM

## 2021-06-22 NOTE — Progress Notes (Signed)
Occupational Therapy Session Note  Patient Details  Name: Mckenzie Guerrero MRN: [id number] Date of Birth: Nov 13, 1950  Today's Date: 06/22/2021 OT Individual Time: 1000-1042 OT Individual Time Calculation (min): 42 min    Short Term Goals: Week 1:  OT Short Term Goal 1 (Week 1): Pt will perform self feeding with Supervision and compensatory techniques/AE PRN OT Short Term Goal 2 (Week 1): Pt will perform shower level bathing CGA OT Short Term Goal 3 (Week 1): Pt will perform 3/3 toileting tasks with CGA  Skilled Therapeutic Interventions/Progress Updates:    Pt resting in recliner upon arrival. Pt indicated she needed to use toilet. Sit<>stand and amb with RW with CGA. Pt completed toileting tasks with close supervision. Pt became agitated when OTA attempted to provided CGA during toileting tasks. Pt returned to room and stood at sink to wash hands Functional amb with RW in hallway to main gym and return with rest breaks X 3. Pt returned to room and remained in recliner. Belt alarm activated. All needs within reach.  Therapy Documentation Precautions:  Precautions Precautions: Fall Precaution Comments: Pt with recent R hernia repair R inattention, aphasic, impulsive Restrictions Weight Bearing Restrictions: No Pain: Pain Assessment Pain Scale: Faces Pain Score: 0-No pain Faces Pain Scale: No hurt   Therapy/Group: Individual Therapy  Leroy Libman 06/22/2021, 10:44 AM

## 2021-06-22 NOTE — Progress Notes (Signed)
Pt removing safety belt and chair pad. Pt will not adhere to safety precautions. Pt self transferring in room and up to bathroom multiple times. Pt agitated and throwing items. Spoke with Camera operator. Pt moved closer to nurses station. Sheela Stack, LPN

## 2021-06-22 NOTE — Progress Notes (Signed)
Occupational Therapy Session Note  Patient Details  Name: Mckenzie Guerrero MRN: [id number] Date of Birth: [birth year]  Today's Date: 06/22/2021 OT Individual Time: 1530-1620 OT Individual Time Calculation (min): 50 min    Short Term Goals: Week 1:  OT Short Term Goal 1 (Week 1): Pt will perform self feeding with Supervision and compensatory techniques/AE PRN OT Short Term Goal 2 (Week 1): Pt will perform shower level bathing CGA OT Short Term Goal 3 (Week 1): Pt will perform 3/3 toileting tasks with CGA  Skilled Therapeutic Interventions/Progress Updates:    Pt sitting EOB with partner at bedside attempting to stand up and partner encouraging her to wait for assistance.  Pt nodding head yes to needing to use bathroom.  Pt ambulated without AD with CGA to toilet, toilet transfer with close supervision.  Continent of urine and wet stool smear.  Needing mod assist for pericare and max multimodal cues due to pt wiping then placing toilet paper on floor repeatedly.  Nurse made aware of redness and swelling at rectum and nurse applied cream.  Clean brief donned with max assist.  Pt ambulated to sink with CGA and washed hands with close supervision needing cues for hand placement at soap dispenser.  Pt ambulated to dayroom approximately 75 feet with RW needing multimodal cues to attend to obstacles on left side.  Instructed pt through various visual motor/ visual perceptual and problem solving tasks while providing simple multimodal cues.  Pt unable to follow one step commands with max cues to select colored peg and place on board at specific location, and pt impulsively grasping pegs and placing on board in random fashion.  Pt also unable to copy simple 3 color sequence on jigsaw puzzle.  Pt ambulated back to room with CGA using RW and return to semi reclined in bed.  Call bell in reach, bed alarm on.  Therapy Documentation Precautions:  Precautions Precautions: Fall Precaution Comments: Pt with recent  R hernia repair R inattention, aphasic, impulsive Restrictions Weight Bearing Restrictions: No    Therapy/Group: Individual Therapy  Ezekiel Slocumb 06/22/2021, 4:22 PM

## 2021-06-22 NOTE — Progress Notes (Signed)
[facility] PHYSICAL MEDICINE & REHABILITATION PROGRESS NOTE  Subjective/Complaints: Nods yes, shakes head no to communication, follows basic commands intermittently such as raising her hand, but cannot follow finger to nose.  Knows she has the communication board and holds it up but has a hard time using it  ROS: limited due to aphasia/cognition  Objective: Vital Signs: Blood pressure 114/62, pulse 80, temperature (!) 97.5 F (36.4 C), temperature source Oral, resp. rate 18, height 5\' 2"  (1.575 m), weight 59.5 kg, SpO2 98 %. No results found. No results for input(s): WBC, HGB, HCT, PLT in the last 72 hours.  Recent Labs    06/22/21 0558  NA 140  K 4.1  CL 108  CO2 22  GLUCOSE 97  BUN 16  CREATININE 0.73  CALCIUM 9.2     Intake/Output Summary (Last 24 hours) at 06/22/2021 1106 Last data filed at 06/22/2021 0900 Gross per 24 hour  Intake 220 ml  Output --  Net 220 ml         Physical Exam: BP 114/62 (BP Location: Left Arm)   Pulse 80   Temp (!) 97.5 F (36.4 C) (Oral)   Resp 18   Ht 5\' 2"  (1.575 m)   Wt 59.5 kg   SpO2 98%   BMI 23.99 kg/m  General: awake, alert, eating D1 diet with NT in room; dentures don't fit- on plate;  NAD, fidgeting and grimacing at times HENT: conjugate gaze; oropharynx moist CV: regular rate but in afib; no JVD Pulmonary: CTA B/L; no W/R/R- good air movement GI: soft, NT, ND, (+)BS- hyperactive Psychiatric: aphasic- flat Neurological: aphasic; apraxic Skin: Warm and dry.  Intact. Musc: No edema in extremities.  No tenderness in extremities. Tenderness to palpation of lower back Neuro: Alert Global aphasia Nods yes to all questions, tries to say her son's name.  Unable to point or follow motor commands without tactile cues. Can follow 1-step commands Right facial weakness.  Motor: Spontaneously moves all extremities, >/3/5 throughout, unchanged  Assessment/Plan: 1. Functional deficits which require 3+ hours per day of  interdisciplinary therapy in a comprehensive inpatient rehab setting. Physiatrist is providing close team supervision and 24 hour management of active medical problems listed below. Physiatrist and rehab team continue to assess barriers to discharge/monitor patient progress toward functional and medical goals   Care Tool:  Bathing    Body parts bathed by patient: Right arm, Left arm, Chest, Abdomen, Face, Right upper leg, Left upper leg   Body parts bathed by helper: Front perineal area, Buttocks, Right lower leg, Left lower leg     Bathing assist Assist Level: Maximal Assistance - Patient 24 - 49%     Upper Body Dressing/Undressing Upper body dressing   What is the patient wearing?: Pull over shirt    Upper body assist Assist Level: Supervision/Verbal cueing    Lower Body Dressing/Undressing Lower body dressing      What is the patient wearing?: Pants, Incontinence brief     Lower body assist Assist for lower body dressing: Moderate Assistance - Patient 50 - 74%     Toileting Toileting    Toileting assist Assist for toileting: Maximal Assistance - Patient 25 - 49%     Transfers Chair/bed transfer  Transfers assist     Chair/bed transfer assist level: Contact Guard/Touching assist     Locomotion Ambulation   Ambulation assist   Ambulation activity did not occur: Safety/medical concerns  Assist level: Minimal Assistance - Patient > 75% Assistive device: No  Device Max distance: 233ft   Walk 10 feet activity   Assist     Assist level: Minimal Assistance - Patient > 75% Assistive device: No Device   Walk 50 feet activity   Assist Walk 50 feet with 2 turns activity did not occur: Safety/medical concerns  Assist level: Minimal Assistance - Patient > 75% Assistive device: No Device    Walk 150 feet activity   Assist Walk 150 feet activity did not occur: Safety/medical concerns  Assist level: Minimal Assistance - Patient > 75% Assistive  device: No Device    Walk 10 feet on uneven surface  activity   Assist Walk 10 feet on uneven surfaces activity did not occur: Safety/medical concerns   Assist level: Minimal Assistance - Patient > 75%     Wheelchair     Assist Is the patient using a wheelchair?: No   Wheelchair activity did not occur: Safety/medical concerns  Wheelchair assist level: Dependent - Patient 0% (patient unable to follow-mulimodal cues for w/c propulsion) Max wheelchair distance: >150 ft    Wheelchair 50 feet with 2 turns activity    Assist        Assist Level: Dependent - Patient 0%   Wheelchair 150 feet activity     Assist      Assist Level: Dependent - Patient 0%    Medical Problem List and Plan: 1. 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  B/C vitamins added to aid stroke recovery  Continue CIR- PT, OT and SLP  2.  Impaired mobility: 70-90 feet: continue eliquis 5mg  BID             -antiplatelet therapy:  ASA daily 3. Low back pain: kpad added. continue scheduled Tylenol 650mg  TID. LFTs reviewed and normal 4. Mood: LCSW to follow for evaluation and support.              -antipsychotic agents: N/A 5. Neuropsych: This patient is not capable of making decisions on her own behalf. 6. Skin/Wound Care: Routine pressure relief measures.  7. Fluids/Electrolytes/Nutrition: Monitor I/Os. 8. A fib: Will monitor BP TID--toprolol 12.5mg  resumed after discussion with pharmacy, continue, well controlled             Monitor with increased activity 9. H/o Migraines: Was on sumatriptan PTA.               Monitor for increase in migraines, especially in association with vascular headaches 10. Leucocytosis: Monitor for fevers and other signs of infection.              WBC 16.1 on 12/4, continue to monitor, repeat tomorrow.  Afebrile  No signs/symptoms of infection  11. Urinary retention?  Incontinence?: continuing voiding trial.  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  Dark-colored stools  Hemoccult ordered, CBC stable, likely secondary to constipation 14. Decreased appetite: megace started.  15. Constipation: resolved with GoLytely, continue milk of mag 59mL daily  12/10- 4 liquid stools yesterday because had been constipated- will maintain bowel meds for now and monitor  LOS: 9 days A FACE TO FACE EVALUATION WAS PERFORMED  Augusten Lipkin P Jennalynn Rivard 06/22/2021, 11:06 AM

## 2021-06-23 DIAGNOSIS — I63512 Cerebral infarction due to unspecified occlusion or stenosis of left middle cerebral artery: Secondary | ICD-10-CM | POA: Diagnosis not present

## 2021-06-23 MED ORDER — ALPRAZOLAM 0.25 MG PO TABS: 0.2500 mg | ORAL_TABLET | Freq: Three times a day (TID) | ORAL | Status: DC | PRN

## 2021-06-23 NOTE — Progress Notes (Addendum)
Occupational Therapy Weekly Progress Note  Patient Details  Name: Mckenzie Guerrero MRN: [id number] Date of Birth: Nov 13, 1950  Beginning of progress report period: June 14, 2021 End of progress report period: June 23, 2021  Today's Date: 06/23/2021 OT Individual Time: 1000-1110 OT Individual Time Calculation (min): 70 min    Patient has met 2 of 3 short term goals. Pt's primary barrier to progress is cognitive impairments including poor problem solving and safety awareness as well as sustained attention, impaired right sided attention versus right visual fieldcut, and impulsivity.  Pt is able to complete basic ADL's with increased independence and functional mobility has improved to close supervision, but she does still require max multimodal cues with fair carryover.  Pt will need 24 hour supervision upon discharge.    Patient continues to demonstrate the following deficits: muscle weakness, decreased cardiorespiratoy endurance, decreased coordination and decreased motor planning, decreased visual motor skills and field cut, decreased attention to right, decreased initiation, decreased attention, decreased awareness, decreased problem solving, decreased safety awareness, decreased memory, and delayed processing, and decreased sitting balance, decreased standing balance, decreased postural control, and decreased balance strategies and therefore will continue to benefit from skilled OT intervention to enhance overall performance with BADL.  Patient progressing toward long term goals..  Continue plan of care.  OT Short Term Goals Week 1:  OT Short Term Goal 1 (Week 1): Pt will perform self feeding with Supervision and compensatory techniques/AE PRN OT Short Term Goal 1 - Progress (Week 1): Met OT Short Term Goal 2 (Week 1): Pt will perform shower level bathing CGA OT Short Term Goal 2 - Progress (Week 1): Met OT Short Term Goal 3 (Week 1): Pt will perform 3/3 toileting tasks with CGA OT  Short Term Goal 3 - Progress (Week 1): Progressing toward goal Week 2:  OT Short Term Goal 1 (Week 2): STGs=LTS due to ELOS  Skilled Therapeutic Interventions/Progress Updates:    Pt semi reclined in bed, nodding yes when asked if she wants to take a shower.  Required max assist to refrain from impulsively exiting bed. Pts sister arrived and present throughout session and observed. OT gathered ADL items and setup for shower.  Pt completed supine to sit with supervision and ambulated without AD to bathroom and initiated toilet transfer with close supervision.  Pt required total assist for pericare due to pt impulsively wiping and throwing toilet paper on floor soiling her hand and toilet paper soiled as well with stool.  Once pt cleaned, pt ambulated to shower and required max multimodal cues to initiate sitting on shower bench for safety. Bathed UB and LB with CGA and max multimodal cues to apply soap and water on washcloth, and move to next body part due to perseverating on washing her legs and feet.  Pt impulsively standing and exiting bathroom despite max multimodal cues to dry off completely first and therapist provided needed CGA to ambulate and sit EOB.  Pt noted with stool smear and pt began using bare hand to wipe bottom needing max multimodal cues and max assist to complete thorough pericare and wash hands.  Also donned brief with max assist.  Donned shirt with supervision. Donned pants with CGA in standing. Stand to sit at recliner with close supervision and max cues.  Call bell in reach, seat belt alarm on.     Therapy Documentation Precautions:  Precautions Precautions: Fall Precaution Comments: Pt with recent R hernia repair R inattention, aphasic, impulsive Restrictions Weight Bearing Restrictions: No  Therapy/Group: Individual Therapy  Ezekiel Slocumb 06/23/2021, 3:56 PM

## 2021-06-23 NOTE — Progress Notes (Signed)
Physical Therapy Session Note  Patient Details  Name: Mckenzie Guerrero MRN: [id number] Date of Birth: [birth year]  Today's Date: 06/23/2021 PT Individual Time: 0800-0900 + 1415-1457 PT Individual Time Calculation (min): 60 min  + 42 min  Short Term Goals: Week 2:  PT Short Term Goal 1 (Week 2): STG = LTG due to ELOS  Skilled Therapeutic Interventions/Progress Updates:     1st session: Pt sitting EOB to start session with LPN at bedside. Per LPN, pt has been agitated and impulsive this morning, also refusing some of her medications. Handoff of care to PT as pt appears agreeable to PT tx. Aphasia impacting communication.  Noted breakfast tray untouched. Encouraged her to eat which she did with full supervision. Demonstrated appropriate visual scanning of all items on tray, including the R side. She required cueing for small bites and sips b/w every other bite. No pocketing noted. Attempted to work on communicating needs while eating for items such as salt/pepper, napkins, silverware, etc but this was ineffective. She ate ~ 35 % of her D1 meal.   Instructed in gait training during session where she required CGA for safety and RW for balance while ambulating variable distances - ~172ft + ~154ft + ~255ft (seated rest breaks b/w trials). Noted with onset of fatigue during longer distances (>179ft), she would demonstrate R lean and increased unsteadiness. Min verbal cues provided for safety awareness and avoiding obstacles on R side of hallway.   Completed Nustep x8 minutes using BUE/BLE with workload set to 4 - VC for increasing cadence to maintain ~50 steps/minute but pt's aphasia impacting understanding of these instructions. VC also needed for completing full hip/knee extension during stepping.  Once returned to room - pt impulsively standing without instruction and ambulating to bathroom with CGA and no AD. She was continent of bladder with smear's of BM that she completed pericare for. Some poor  hygiene awareness as she would toss used toilet paper on the ground instead of toilet - required VC for awareness.  Pt completed session seated EOB with bed alarm on and all immediate items in reach. LPN made aware of pt's tolerance and cooperation during PT treatment.   2nd session: Pt sitting in recliner to start session with her partner and partner's daughter at bedside. They brought in her dog from home (small lap dog) and pt was in bright spirits about this. She appeared agreeable to PT treatment and her partner requested to follow along during therapy session. Pt completed sit<>stand with no AD and CGA and ambulates to bathroom where she's continent of bladder and some small smears of BM that she had difficulty cleansing for thoroughness with similar awareness deficits as above. Required mod cues at sink for hand washing due to apraxia. Ambulated ~167ft with CGA and no AD to day room rehab gym where she was assisted on Nustep and completed 8 minutes at workload 6, using BUE/BLE, maintaining cadence of 30 steps/minute. Ambulated to main rehab gym, ~139ft with CGA and no AD with some intermittent minA due to fatigue from Nustep and some minor LOB's to the R. Instructed in stair training where she navigated x16 3inch steps and x8 6inch steps with CGA and 2 hand rails - both completed without seated rest break - demo's reciprocal stepping during ascent and step-to pattern for descent. No knee buckling or overt LOB during stair training and anticipate supervision level will be needed at DC for safety which was voiced to her partner. She then ambulated to ortho  room rehab gym where she completed car transfer at [facility] level with cues needed for safety approach and safe transfer - car height simulated their [place]. Pt ambulated back to her room, ~233ft, with CGA and no AD with assist provided from her partner and PT providing supervision. Partner does well with guarding and maintaining CGA on gait belt while  ambulating. Pt concluded session seated EOB with bed alarm on, all needs within reach, family remained at bedside with their dog on pt's lap.   Therapy Documentation Precautions:  Precautions Precautions: Fall Precaution Comments: Pt with recent R hernia repair R inattention, aphasic, impulsive Restrictions Weight Bearing Restrictions: No General:     Therapy/Group: Individual Therapy  Ashten Prats P Kenyen Candy 06/23/2021, 7:26 AM

## 2021-06-23 NOTE — Progress Notes (Signed)
[place] PHYSICAL MEDICINE & REHABILITATION PROGRESS NOTE  Subjective/Complaints: Continues to nod in response to questions Yelling and pushing staff when they try to give her medications. PRN Xanax ordered.  Vitals stable  ROS: Limited due to aphasia/cognition  Objective: Vital Signs: Blood pressure 124/76, pulse 71, temperature 98.3 F (36.8 C), resp. rate 18, height 5\' 2"  (1.575 m), weight 59.5 kg, SpO2 98 %. No results found. No results for input(s): WBC, HGB, HCT, PLT in the last 72 hours.  Recent Labs    06/22/21 0558  NA 140  K 4.1  CL 108  CO2 22  GLUCOSE 97  BUN 16  CREATININE 0.73  CALCIUM 9.2     Intake/Output Summary (Last 24 hours) at 06/23/2021 1241 Last data filed at 06/23/2021 1240 Gross per 24 hour  Intake 300 ml  Output --  Net 300 ml         Physical Exam: BP 124/76 (BP Location: Right Arm)    Pulse 71    Temp 98.3 F (36.8 C)    Resp 18    Ht 5\' 2"  (1.575 m)    Wt 59.5 kg    SpO2 98%    BMI 23.99 kg/m  General: awake, alert, eating D1 diet with NT in room; dentures don't fit- on plate;  NAD, fidgeting and grimacing at times HENT: conjugate gaze; oropharynx moist CV: regular rate but in afib; no JVD Pulmonary: CTA B/L; no W/R/R- good air movement GI: soft, NT, ND, (+)BS- hyperactive Psychiatric: aphasic- flat Neurological: aphasic; apraxic, agitated at times Skin: Warm and dry.  Intact. Musc: No edema in extremities.  No tenderness in extremities. Tenderness to palpation of lower back Neuro: Alert Global aphasia Nods yes to all questions, tries to say her son's name.  Unable to point or follow motor commands without tactile cues. Can follow 1-step commands Right facial weakness.  Motor: Spontaneously moves all extremities, >/3/5 throughout, unchanged  Assessment/Plan: 1. Functional deficits which require 3+ hours per day of interdisciplinary therapy in a comprehensive inpatient rehab setting. Physiatrist is providing close team  supervision and 24 hour management of active medical problems listed below. Physiatrist and rehab team continue to assess barriers to discharge/monitor patient progress toward functional and medical goals   Care Tool:  Bathing    Body parts bathed by patient: Right arm, Left arm, Chest, Abdomen, Face, Right upper leg, Left upper leg   Body parts bathed by helper: Front perineal area, Buttocks, Right lower leg, Left lower leg     Bathing assist Assist Level: Maximal Assistance - Patient 24 - 49%     Upper Body Dressing/Undressing Upper body dressing   What is the patient wearing?: Pull over shirt    Upper body assist Assist Level: Supervision/Verbal cueing    Lower Body Dressing/Undressing Lower body dressing      What is the patient wearing?: Pants, Incontinence brief     Lower body assist Assist for lower body dressing: Moderate Assistance - Patient 50 - 74%     Toileting Toileting    Toileting assist Assist for toileting: Maximal Assistance - Patient 25 - 49%     Transfers Chair/bed transfer  Transfers assist     Chair/bed transfer assist level: Contact Guard/Touching assist     Locomotion Ambulation   Ambulation assist   Ambulation activity did not occur: Safety/medical concerns  Assist level: Minimal Assistance - Patient > 75% Assistive device: No Device Max distance: 227ft   Walk 10 feet activity  Assist     Assist level: Minimal Assistance - Patient > 75% Assistive device: No Device   Walk 50 feet activity   Assist Walk 50 feet with 2 turns activity did not occur: Safety/medical concerns  Assist level: Minimal Assistance - Patient > 75% Assistive device: No Device    Walk 150 feet activity   Assist Walk 150 feet activity did not occur: Safety/medical concerns  Assist level: Minimal Assistance - Patient > 75% Assistive device: No Device    Walk 10 feet on uneven surface  activity   Assist Walk 10 feet on uneven surfaces  activity did not occur: Safety/medical concerns   Assist level: Minimal Assistance - Patient > 75%     Wheelchair     Assist Is the patient using a wheelchair?: No   Wheelchair activity did not occur: Safety/medical concerns  Wheelchair assist level: Dependent - Patient 0% (patient unable to follow-mulimodal cues for w/c propulsion) Max wheelchair distance: >150 ft    Wheelchair 50 feet with 2 turns activity    Assist        Assist Level: Dependent - Patient 0%   Wheelchair 150 feet activity     Assist      Assist Level: Dependent - Patient 0%    Medical Problem List and Plan: 1. 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  B/C vitamins added to aid stroke recovery  Contiue CIR- PT, OT and SLP 2.  Impaired mobility: 150 feet: continue eliquis 5mg  BID             -antiplatelet therapy:  ASA daily 3. Low back pain: kpad added. continue scheduled Tylenol 650mg  TID. LFTs reviewed and normal 4. Agitation: LCSW to follow for evaluation and support. PRN Xanax ordered 5. Neuropsych: This patient is not capable of making decisions on her own behalf. 6. Skin/Wound Care: Routine pressure relief measures.  7. Fluids/Electrolytes/Nutrition: Monitor I/Os. 8. A fib: Will monitor BP TID--toprolol 12.5mg  resumed after discussion with pharmacy, continue, well controlled             Monitor with increased activity 9. H/o Migraines: Was on sumatriptan PTA.               Monitor for increase in migraines, especially in association with vascular headaches 10. Leucocytosis: Monitor for fevers and other signs of infection.              WBC 16.1 on 12/4, continue to monitor, repeat tomorrow.  Afebrile  No signs/symptoms of infection  11. Urinary retention? Incontinence?: continuing voiding trial.  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  Dark-colored stools  Hemoccult ordered, CBC stable, likely secondary to constipation 14. Decreased appetite: megace started.  15. Constipation: resolved with GoLytely, continue milk of mag 34mL daily  12/10- 4 liquid stools yesterday because had been constipated- will maintain bowel meds for now and monitor  LOS: 10 days A FACE TO FACE EVALUATION WAS PERFORMED  Mckenzie Guerrero 06/23/2021, 12:41 PM

## 2021-06-23 NOTE — Progress Notes (Addendum)
Patient ID: Mckenzie Guerrero, female   DOB: 1950/11/24, 70 y.o.   MRN: [id number]  Mckenzie Guerrero to schedule family education he reports Friday works better so have scheduled for Friday 9-12 he will bring his brother also. Aware pt is getting agiatated and asserting herself. He reports she has a strong personality to begin with. Will inform team

## 2021-06-23 NOTE — Progress Notes (Signed)
Speech Language Pathology Daily Session Note  Patient Details  Name: Mckenzie Guerrero MRN: [id number] Date of Birth: 08-05-1950  Today's Date: 06/23/2021 SLP Individual Time: 8182-9937 SLP Individual Time Calculation (min): 28 min SLP Missed Time: 19 Minutes Missed Time Reason: Patient fatigue  Short Term Goals: Week 2: SLP Short Term Goal 1 (Week 2): STG = LTG dt/ ELOS  Skilled Therapeutic Interventions: Pt seen for skilled ST with focus on speech and swallow goals. Pt with eyes closed when SLP entered but wakes to voice and is calm and agreeable to speech therapy. Pt agrees to further Dys 2 trials, HOB raised to promote safe swallow. Pt taking single bite of Dys 2 snack, masticating for brief period of time before expectorating entire bolus. Yes/no questions asked to determine pt did not like flavor however pt communicates she doesn't want any more food at this time. Pt partner and their dog had recently left room, when asked yes/no questions regarding dog pt verbalizing animatedly "oh yea!" A few times and laughing a few times. Pt unable to verbalize "no" this date but does shake head. Yes/no questions with increased accuracy this date ~50% with max A. Pt able to point to body parts with 25% accuracy provided max A multimodal cues including hand over hand at times. At this point, pt becoming quite lethargic and was falling quickly asleep. Pt missed 17 minutes of skilled ST due to fatigue. Pt left in bed sleeping soundly with alarm set and all needs within reach. Cont ST POC.   Pain Pain Assessment Pain Scale: 0-10 Pain Score: 0-No pain  Therapy/Group: Individual Therapy  Mckenzie Guerrero 06/23/2021, 3:32 PM

## 2021-06-23 NOTE — Progress Notes (Signed)
Pt refusing to take medication. Pt yelling at staff and pushing staff. After final attempt pt took meds. SEE MAR. Sheela Stack, LPN

## 2021-06-24 DIAGNOSIS — Z515 Encounter for palliative care: Secondary | ICD-10-CM

## 2021-06-24 DIAGNOSIS — Z789 Other specified health status: Secondary | ICD-10-CM

## 2021-06-24 DIAGNOSIS — I63512 Cerebral infarction due to unspecified occlusion or stenosis of left middle cerebral artery: Secondary | ICD-10-CM | POA: Diagnosis not present

## 2021-06-24 NOTE — Progress Notes (Addendum)
Progress note:  Chart reviewed.  Patient assessed at bedside.  She is able to communicate by saying yes.  She is not able to verbalize any other words during our visit.  Introduced palliative medicine as an extra layer support for both patient and family as they navigate medical decisions and goals of care discussions.  She nodded her head yes when asked if I could speak with her son regarding her medical treatment and goals of care. I attempted to see if she could verbalize any other words but her only reply was yes.   Given her inability to effectively communicate her thoughts, I attempted to speak with son.  No answer.  HIPAA appropriate voicemail left.  Palliative medicine team will continue to follow the patient throughout her hospitalization.  I am on service tomorrow and will attempt to speak with family member again.  [place]ko Ilsa Iha, FNP-BC Palliative Medicine Team Team Phone # [phone]  NO CHARGE

## 2021-06-24 NOTE — Progress Notes (Signed)
[place] PHYSICAL MEDICINE & REHABILITATION PROGRESS NOTE  Subjective/Complaints: Seen ambulating well with Christian in hallway. Aphasia continues to be severe and limiting No issues reported overnight Vital signs stable  ROS: Limited due to aphasia/cognition  Objective: Vital Signs: Blood pressure 103/67, pulse 69, temperature 98.5 F (36.9 C), resp. rate 16, height 5\' 2"  (1.575 m), weight 57.3 kg, SpO2 98 %. No results found. No results for input(s): WBC, HGB, HCT, PLT in the last 72 hours.  Recent Labs    06/22/21 0558  NA 140  K 4.1  CL 108  CO2 22  GLUCOSE 97  BUN 16  CREATININE 0.73  CALCIUM 9.2     Intake/Output Summary (Last 24 hours) at 06/24/2021 0924 Last data filed at 06/23/2021 1240 Gross per 24 hour  Intake 100 ml  Output --  Net 100 ml         Physical Exam: BP 103/67    Pulse 69    Temp 98.5 F (36.9 C)    Resp 16    Ht 5\' 2"  (1.575 m)    Wt 57.3 kg    SpO2 98%    BMI 23.10 kg/m  General: awake, alert, eating D1 diet with NT in room; dentures don't fit- on plate;  NAD, fidgeting and grimacing at times HENT: conjugate gaze; oropharynx moist CV: regular rate but in afib; no JVD Pulmonary: CTA B/L; no W/R/R- good air movement GI: soft, NT, ND, (+)BS- hyperactive Psychiatric: aphasic- flat Neurological: aphasic; apraxic, agitated at times Skin: Warm and dry.  Intact. Musc: No edema in extremities.  No tenderness in extremities. Tenderness to palpation of lower back Neuro: Alert Global aphasia Nods yes to all questions, tries to say her son's name.  Unable to point or follow motor commands without tactile cues. Can follow 1-step commands Right facial weakness.  Motor: Spontaneously moves all extremities, >/3/5 throughout, ambulating CG with RW   Assessment/Plan: 1. Functional deficits which require 3+ hours per day of interdisciplinary therapy in a comprehensive inpatient rehab setting. Physiatrist is providing close team supervision and  24 hour management of active medical problems listed below. Physiatrist and rehab team continue to assess barriers to discharge/monitor patient progress toward functional and medical goals   Care Tool:  Bathing    Body parts bathed by patient: Right arm, Left arm, Chest, Abdomen, Face, Right upper leg, Left upper leg   Body parts bathed by helper: Front perineal area, Buttocks, Right lower leg, Left lower leg     Bathing assist Assist Level: Maximal Assistance - Patient 24 - 49%     Upper Body Dressing/Undressing Upper body dressing   What is the patient wearing?: Pull over shirt    Upper body assist Assist Level: Supervision/Verbal cueing    Lower Body Dressing/Undressing Lower body dressing      What is the patient wearing?: Pants, Incontinence brief     Lower body assist Assist for lower body dressing: Moderate Assistance - Patient 50 - 74%     Toileting Toileting    Toileting assist Assist for toileting: Maximal Assistance - Patient 25 - 49%     Transfers Chair/bed transfer  Transfers assist     Chair/bed transfer assist level: Contact Guard/Touching assist     Locomotion Ambulation   Ambulation assist   Ambulation activity did not occur: Safety/medical concerns  Assist level: Minimal Assistance - Patient > 75% Assistive device: No Device Max distance: 226ft   Walk 10 feet activity   Assist  Assist level: Minimal Assistance - Patient > 75% Assistive device: No Device   Walk 50 feet activity   Assist Walk 50 feet with 2 turns activity did not occur: Safety/medical concerns  Assist level: Minimal Assistance - Patient > 75% Assistive device: No Device    Walk 150 feet activity   Assist Walk 150 feet activity did not occur: Safety/medical concerns  Assist level: Minimal Assistance - Patient > 75% Assistive device: No Device    Walk 10 feet on uneven surface  activity   Assist Walk 10 feet on uneven surfaces activity did not  occur: Safety/medical concerns   Assist level: Minimal Assistance - Patient > 75%     Wheelchair     Assist Is the patient using a wheelchair?: No   Wheelchair activity did not occur: Safety/medical concerns  Wheelchair assist level: Dependent - Patient 0% (patient unable to follow-mulimodal cues for w/c propulsion) Max wheelchair distance: >150 ft    Wheelchair 50 feet with 2 turns activity    Assist        Assist Level: Dependent - Patient 0%   Wheelchair 150 feet activity     Assist      Assist Level: Dependent - Patient 0%    Medical Problem List and Plan: 1. 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  B/C vitamins added to aid stroke recovery  Contiue CIR- PT, OT and SLP  -Interdisciplinary Team Conference today   2.  Impaired mobility: 150 feet: Continue eliquis 5mg  BID             -antiplatelet therapy:  ASA daily 3. Low back pain: kpad added. continue scheduled Tylenol 650mg  TID. LFTs reviewed and normal 4. Agitation: LCSW to follow for evaluation and support. PRN Xanax ordered 5. Neuropsych: This patient is not capable of making decisions on her own behalf. 6. Skin/Wound Care: Routine pressure relief measures.  7. Fluids/Electrolytes/Nutrition: Monitor I/Os. 8. A fib: Will monitor BP TID--toprolol 12.5mg  resumed after discussion with pharmacy, continue, well controlled             Monitor with increased activity 9. H/o Migraines: Was on sumatriptan PTA.               Monitor for increase in migraines, especially in association with vascular headaches 10. Leucocytosis: Monitor for fevers and other signs of infection.              WBC 16.1 on 12/4, continue to monitor, repeat tomorrow.  Afebrile  No signs/symptoms of infection  11. Urinary retention? Incontinence?: continuing voiding trial.  12/10- is voiding- rare incontinence 12.  Post stroke  dysphagia  D1 thins, advance diet as tolerated 13.  Dark-colored stools  Hemoccult ordered, CBC stable, likely secondary to constipation 14. Decreased appetite: megace started.  15. Constipation: resolved with GoLytely, continue milk of mag 54mL daily  LOS: 11 days A FACE TO FACE EVALUATION WAS PERFORMED  Colson Barco P Rayner Erman 06/24/2021, 9:24 AM

## 2021-06-24 NOTE — Patient Care Conference (Signed)
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 Leeroy Cha Social Worker Present: Ovidio Kin, LCSW Nurse Present: Dorien Chihuahua, RN PT Present: Ginnie Smart, PT OT Present: Leretha Pol, OT SLP Present: Sherren Kerns, SLP PPS Coordinator present : Gunnar Fusi, SLP     Current Status/Progress Goal Weekly Team Focus  Bowel/Bladder   cont of B/B LBM 12/13  remain continent  toilet q4 and prn   Swallow/Nutrition/ Hydration   D1, thin  minA least restrictive diet  trials of Dys 2   ADL's   max toileting, UB supervision, LB close supervision, poor safety awareness, poor awareness of deficits, poor problem solving, impulsive  supervision  functional transfers, problem solving and command following training, caregiver education, self care training   Mobility   supervision bed mobility, CGA to supervision for transfers, CGA/minA gait >232ft with no AD  supervision  Communicating needs, safety awareness, gait training, DC planning, family ed   Communication   max-total A  mod A  yes/no questions, 1-step directions, trial of simple communication board   Safety/Cognition/ Behavioral Observations  max-total A  modA  1-step command following   Pain   scheduled pain medication  pain <3   assess q shift and prn   Skin   closed abdominal incisions. MASD to perineum  heal MASD  assess q shift and prn     Discharge Planning:  Have scheduled fmaily education for Friday 9-12. Son;s are pt will need 24/7 due to speech issues and for safety. Will need to go to OP due to no HH Speech in [employer] Discussion: Patient with aphasia frustrated by situation; refusing medications. Patient with poor safety awareness, poor awareness of deficits post Left MCA CVA including neglect, and problem solving. Low back pain managed.  Patient on target to meet rehab goals: yes, currently needs supervision for safety awareness. Requires total assist for toileting but is continent. Constipation addressed; now with loose stools, MD adjusted medications. Patient needs supervision for transfers and CGA for long distance ambulation; drifts with fatigue. Needs max assist for communication.   *See Care Plan and progress notes for long and short-term goals.   Revisions to Treatment Plan:  Palliative care consult Downgraded SLP goals to mod assist   Teaching Needs: Safety, medication management, secondary risk management, transfers, toileting, etc.   Current Barriers to Discharge: Decreased caregiver support and Behavior and lack of HH SLP services  Possible Resolutions to Barriers: Family education with son(s) Follow up SLP services recommended     Medical Summary Current Status: severe constipation, agitation when trying to get her medications, severe aphasia, neglect, impulsivity, impaired problem solving, low back pain  Barriers to Discharge: Behavior;Neurogenic Bowel & Bladder;Medical stability  Barriers to Discharge Comments: severe constipation, agitation when trying to get her  medications, severe aphasia, neglect, impulsivity, impaired problem solving, low back pain Possible Resolutions to [employer]: continue daily milk of mag, d/c colace since now with loose stools, xanax  prn added for agitation, continue tylenol and kpad for low back pain   Continued Need for Acute Rehabilitation Level of Care: The patient requires daily medical management by a physician with specialized training in physical medicine and rehabilitation for the following reasons: Direction of a multidisciplinary physical rehabilitation program to maximize functional independence : Yes Medical management of patient stability for increased activity during participation in an intensive rehabilitation regime.: Yes Analysis of laboratory values and/or radiology reports with any subsequent need for medication adjustment and/or medical intervention. : Yes   I attest that I was present, lead the team conference, and concur with the assessment and plan of the team.   Dorien Chihuahua B 06/24/2021, 2:49 PM

## 2021-06-24 NOTE — Progress Notes (Signed)
Occupational Therapy Note  Patient Details  Name: Mckenzie Guerrero MRN: [id number] Date of Birth: 11-16-1950  Today's Date: 06/24/2021 OT Missed Time: 5 Minutes Missed Time Reason: Patient unwilling/refused to participate without medical reason;Patient fatigue  Pt sleeping in bed upon arrival and easily aroused. Pt repeatedly indicated she didn't want to get OOB and became slightly upset when physical assistance attempted. Pt missed 45 mins skilled OT services.    Leotis Shames [facility] 06/24/2021, 11:06 AM

## 2021-06-24 NOTE — Progress Notes (Signed)
Speech Language Pathology Daily Session Note  Patient Details  Name: Mckenzie Guerrero MRN: [id number] Date of Birth: Mar 17, 1951  Today's Date: 06/24/2021 SLP Individual Time: 1435-1515 SLP Individual Time Calculation (min): 40 min  Short Term Goals: Week 2: SLP Short Term Goal 1 (Week 2): STG = LTG dt/ ELOS  Skilled Therapeutic Interventions: Pt seen for skilled ST with focus on communication and swallowing goals, pt agreeable to sit EOB for diet trials. Pt shaking head "no" to variety of Dys 2 trial items, was agreeable to vanilla sandwich cookies from vending machine. Pt requiring multiple attempts to take bite of cookie, appeared too hard for patient. Pt finally able to break off small bite, oral phase notable for severely extended mastication and poor bolus prep/cohesion. Pt with some grimacing during mastication with eventual swallow of bolus, pt holding throat after swallow. Pt asked if cookie was too crunchy and she states "yea", when asked if it was painful to swallow patient shakes head "no". Focus on remainder of session on utilizing yes/no communication board. Pt continues to demonstrate significant difficulty with following 1-step directions (pointing) for effective use of communication board. Continue to recommend max A for multimodal communication methods to determine basic wants and needs. Pt lying supine, left in bed with alarm set and all needs within reach. Cont ST POC.   Pain Pain Assessment Pain Scale: Faces Pain Score: 0-No pain Faces Pain Scale: No hurt  Therapy/Group: Individual Therapy  Dewaine Conger 06/24/2021, 2:48 PM

## 2021-06-24 NOTE — Progress Notes (Signed)
Physical Therapy Session Note  Patient Details  Name: Mckenzie Guerrero MRN: [id number] Date of Birth: [birth year]  Today's Date: 06/24/2021 PT Individual Time: 0900-0957 PT Individual Time Calculation (min): 57 min   Short Term Goals: Week 2:  PT Short Term Goal 1 (Week 2): STG = LTG due to ELOS  Skilled Therapeutic Interventions/Progress Updates:     Pt supine in bed to start - agreeable to PT tx without indications of pain. Aphasia impacting communication. Supine<>sit with supervision with HOB slightly elevated. Sit<>Stand to RW with supervision and she ambulates to bathroom with supervision and RW but abandons RW upon doorway entrance. Ambulates to toilet with CGA and no AD and she's continent of bladder and small smear of BM. Poor awareness of hygiene and manging toilet paper while wiping. Ambulated sinkside for hand hygine which she did with supervision but required instructional cues for soap and washing. Ambulated to ortho rehab gym with supervision and RW, ~224ft - noted R drifting at times and verbal cues for keeping body symmetrically in RW with some minimal carryover. In ortho gym, completed x8 minutes of standing UE ergometer at level 2.5 resistance with supervision for balance - noted RUE drifting at times and benefited from min cues for increasing awareness for hand placement. Pt then instructed in dynamic standing balance task for ball toss (using large red kickball) with supervision with therapist tossed ball to challenge balance, RUE coordination, righting reactions, and ankle/hip strategies. She had x1 LOB to the R that required minA for correction. Ambulated to main rehab gym, ~121ft, with supervision and RW. While sitting on mat table - instructed in cognitive task for building PVC puzzle tree - required totalA for accuracy as pt unable to follow duplication of simple picture. Pt unable to follow simple one step commands to select appropriate pieces or place in specific locations. Pt  ambulated back to room with CGA and RW and again, went to toilet where she was continent of very small BM - LPN made aware of frequent bathroom trips and PT assisted pt with thoroughness during peri care. Returned to bed and completed bed mobility at supervision level. Remained semi-reclined in bed with bed alarm on and all needs in reach.   Therapy Documentation Precautions:  Precautions Precautions: Fall Precaution Comments: Pt with recent R hernia repair R inattention, aphasic, impulsive Restrictions Weight Bearing Restrictions: No General:    Therapy/Group: Individual Therapy  Alger Simons 06/24/2021, 7:40 AM

## 2021-06-24 NOTE — Progress Notes (Signed)
Occupational Therapy Session Note  Patient Details  Name: Mckenzie Guerrero MRN: [id number] Date of Birth: [birth year]  Today's Date: 06/24/2021 OT Individual Time: 6283-6629 OT Individual Time Calculation (min): 55 min    Short Term Goals: Week 2:  OT Short Term Goal 1 (Week 2): STGs=LTS due to ELOS  Skilled Therapeutic Interventions/Progress Updates:    Pt semi reclined in bed, stating okay when asked if she had any pain. Pt needing max tactile and visual cues throughout and limited verbal cues to simple one to two word commands throughout session to increase pt participation and safety.  Pt initiating supine to sit, sit to stand and ambulating without AD to bathroom with close supervision.  Toilet transfer using grab bar with supervision.  Pt had smear of stool and began wiping with toilet paper and trash can placed directly in front of pt to avoid throwing on floor.  Therapist provided total assist pericare using wet washcloth for thoroughness and pt stood and completed clothing management with close supervision.  Pt ambulated to sink and washed hands in standing.  Guided pt to hallway with tactile and visual cues and ambulated approximately 200 feet to ADL suite where pt engaged in functional cognitive tasks including categorizing fork and spoons, folding laundry, and putting cups/plates on shelf with like items.  Pt unable to match fork and spoon to matching item despite repetitive visual demonstration and hand over hand guided forward chaining, instead pt placing items in random places on counter.  Pt also unable to match cups/plates to like items on shelf above midline but she was able to match cup and plate in direct view after multiple repetition visual demonstration by therapist.  Pt able to fold towels and place in laundry basket with setup only no cueing necessary.  Pt then ambulated to ortho gym with close supervision and completed simple tracing shapes on BITS and did so with only min  tactile and visual cues.  Ambulated back to room and returned to EOB where here lunch was setup.  Pt self fed approximately 50% of meal with supervision and only min intermittent cues for problem solving.  Returned to semireclined in bed, call bell in reach, bed alarm on.  Therapy Documentation Precautions:  Precautions Precautions: Fall Precaution Comments: Pt with recent R hernia repair R inattention, aphasic, impulsive Restrictions Weight Bearing Restrictions: No   Therapy/Group: Individual Therapy  Ezekiel Slocumb 06/24/2021, 1:21 PM

## 2021-06-25 DIAGNOSIS — I63512 Cerebral infarction due to unspecified occlusion or stenosis of left middle cerebral artery: Secondary | ICD-10-CM | POA: Diagnosis not present

## 2021-06-25 NOTE — Progress Notes (Signed)
[place] PHYSICAL MEDICINE & REHABILITATION PROGRESS NOTE  Subjective/Complaints: Tolerated therapy today Resting in bed afterward Ambulating >300 feet Vitals stable  EXH:[id number] due to aphasia/cognition  Objective: Vital Signs: Blood pressure 101/60, pulse 72, temperature 97.6 F (36.4 C), resp. rate 16, height 5\' 2"  (1.575 m), weight 57.3 kg, SpO2 99 %. No results found. No results for input(s): WBC, HGB, HCT, PLT in the last 72 hours.  No results for input(s): NA, K, CL, CO2, GLUCOSE, BUN, CREATININE, CALCIUM in the last 72 hours.    Intake/Output Summary (Last 24 hours) at 06/25/2021 1216 Last data filed at 06/25/2021 0737 Gross per 24 hour  Intake 318 ml  Output --  Net 318 ml         Physical Exam: BP 101/60    Pulse 72    Temp 97.6 F (36.4 C)    Resp 16    Ht 5\' 2"  (1.575 m)    Wt 57.3 kg    SpO2 99%    BMI 23.10 kg/m  General: awake, alert, eating D1 diet with NT in room; dentures don't fit- on plate;  NAD, fidgeting and grimacing at times HENT: conjugate gaze; oropharynx moist CV: regular rate but in afib; no JVD Pulmonary: CTA B/L; no W/R/R- good air movement GI: soft, NT, ND, (+)BS- hyperactive Psychiatric: aphasic- flat Neurological: aphasic; apraxic, agitated at times Skin: Warm and dry.  Intact. Musc: No edema in extremities.  No tenderness in extremities. Tenderness to palpation of lower back Neuro: Alert Global aphasia Nods yes to all questions, tries to say her son's name.  Unable to point or follow motor commands without tactile cues. Can follow 1-step commands Right facial weakness.  Motor: Spontaneously moves all extremities, >/3/5 throughout, ambulating CG with RW   Assessment/Plan: 1. Functional deficits which require 3+ hours per day of interdisciplinary therapy in a comprehensive inpatient rehab setting. Physiatrist is providing close team supervision and 24 hour management of active medical problems listed below. Physiatrist and  rehab team continue to assess barriers to discharge/monitor patient progress toward functional and medical goals   Care Tool:  Bathing    Body parts bathed by patient: Right arm, Left arm, Chest, Abdomen, Face, Right upper leg, Left upper leg   Body parts bathed by helper: Front perineal area, Buttocks, Right lower leg, Left lower leg     Bathing assist Assist Level: Maximal Assistance - Patient 24 - 49%     Upper Body Dressing/Undressing Upper body dressing   What is the patient wearing?: Pull over shirt    Upper body assist Assist Level: Supervision/Verbal cueing    Lower Body Dressing/Undressing Lower body dressing      What is the patient wearing?: Pants, Incontinence brief     Lower body assist Assist for lower body dressing: Moderate Assistance - Patient 50 - 74%     Toileting Toileting    Toileting assist Assist for toileting: Maximal Assistance - Patient 25 - 49%     Transfers Chair/bed transfer  Transfers assist     Chair/bed transfer assist level: Contact Guard/Touching assist     Locomotion Ambulation   Ambulation assist   Ambulation activity did not occur: Safety/medical concerns  Assist level: Minimal Assistance - Patient > 75% Assistive device: No Device Max distance: 235ft   Walk 10 feet activity   Assist     Assist level: Minimal Assistance - Patient > 75% Assistive device: No Device   Walk 50 feet activity   Assist Walk 50  feet with 2 turns activity did not occur: Safety/medical concerns  Assist level: Minimal Assistance - Patient > 75% Assistive device: No Device    Walk 150 feet activity   Assist Walk 150 feet activity did not occur: Safety/medical concerns  Assist level: Minimal Assistance - Patient > 75% Assistive device: No Device    Walk 10 feet on uneven surface  activity   Assist Walk 10 feet on uneven surfaces activity did not occur: Safety/medical concerns   Assist level: Minimal Assistance -  Patient > 75%     Wheelchair     Assist Is the patient using a wheelchair?: No   Wheelchair activity did not occur: Safety/medical concerns  Wheelchair assist level: Dependent - Patient 0% (patient unable to follow-mulimodal cues for w/c propulsion) Max wheelchair distance: >150 ft    Wheelchair 50 feet with 2 turns activity    Assist        Assist Level: Dependent - Patient 0%   Wheelchair 150 feet activity     Assist      Assist Level: Dependent - Patient 0%    Medical Problem List and Plan: 1. 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  B/C vitamins added to aid stroke recovery  Continue CIR- PT, OT and SLP 2.  Impaired mobility: ambulating >300 feet: Continue eliquis 5mg  BID             -antiplatelet therapy:  ASA daily 3. Low back pain: kpad added. Continue scheduled Tylenol 650mg  TID. LFTs reviewed and normal 4. Agitation: LCSW to follow for evaluation and support. PRN Xanax ordered 5. Neuropsych: This patient is not capable of making decisions on her own behalf. 6. Skin/Wound Care: Routine pressure relief measures.  7. Fluids/Electrolytes/Nutrition: Monitor I/Os. 8. A fib: Will monitor BP TID--toprolol 12.5mg  resumed after discussion with pharmacy, continue, well controlled             Monitor with increased activity 9. H/o Migraines: Was on sumatriptan PTA.               Monitor for increase in migraines, especially in association with vascular headaches 10. Leucocytosis: Monitor for fevers and other signs of infection.              WBC 16.1 on 12/4, continue to monitor, repeat tomorrow.  Afebrile  No signs/symptoms of infection  11. Urinary retention? Incontinence?: continuing voiding trial.  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  Dark-colored stools  Hemoccult negative on third read, CBC  stable, likely secondary to constipation, continue mild of mag as below.  14. Decreased appetite: megace started.  15. Constipation: resolved with GoLytely, continue milk of mag 72mL daily  LOS: 12 days A FACE TO FACE EVALUATION WAS PERFORMED  Deacon Gadbois P Criss Pallone 06/25/2021, 12:16 PM

## 2021-06-25 NOTE — Progress Notes (Signed)
Physical Therapy Session Note  Patient Details  Name: Mckenzie Guerrero MRN: [id number] Date of Birth: 08-25-1950  Today's Date: 06/25/2021 PT Individual Time: 1300-1325 PT Individual Time Calculation (min): 25 min  and Today's Date: 06/25/2021 PT Missed Time: 20 Minutes Missed Time Reason: Increased agitation;Patient unwilling to participate;Pain  Short Term Goals: Week 1:  PT Short Term Goal 1 (Week 1): Patient will perform basic transfers with CGA using LRAD. PT Short Term Goal 2 (Week 1): Patient will ambulate >150 ft using LRAD with min A. PT Short Term Goal 3 (Week 1): Patient will improved Berg Balance Scale by MCID (8 points) PT Short Term Goal 4 (Week 1): Patient will ascend/descend 12 step with min A using 1 rail to simulate home set-up. Week 2:  PT Short Term Goal 1 (Week 2): STG = LTG due to ELOS  Skilled Therapeutic Interventions/Progress Updates:  Pt received supine in bed, no visual/verbal signs of pain and agreeable to PT. Emphasis of session on R NMR and gait training. Pt performed supine <> sit EOB w/S* and sit <>stand w/CGA, no AD. Pt ambulated 5' w/180 degree turn and no AD w/CGA to [facility]. Pt transported to 4N hallway for gait training to be in more scenic environment for improved mood. Pt performed sit <>stands wCGA and ambulated 200' x2 w/CGA and no AD. Noted mild R path deviations and occasional grabbing at wall on R side, no major LOB noted. Pt took short seated rest break in between gait trials and her body language changed to indicate pain. Attempted to communicate regarding pain, but she became agitated and began yelling in hallway. Unable to calm patient down and returned pt to room w/total A for time management and attempt to de-escalate situation. Pt performed sit <>stand pivot from [facility] <> bed in room w/CGA and sit <>supine w/S*. Pt continued to yell and hit her head, nursing notified to administer pain meds. Removed any harmful objects from pt's reach and she was left  supine in bed, all needs in reach, nursing present to administer meds. Missed 20 minutes of skilled PT due to pt agitation, refusal and suspected pain.    Therapy Documentation Precautions:  Precautions Precautions: Fall Precaution Comments: Pt with recent R hernia repair R inattention, aphasic, impulsive Restrictions Weight Bearing Restrictions: No   Therapy/Group: Individual Therapy Cruzita Lederer Steffan Caniglia, PT, DPT  06/25/2021, 7:41 AM

## 2021-06-25 NOTE — Progress Notes (Signed)
Physical Therapy Session Note  Patient Details  Name: Mckenzie Guerrero MRN: [id number] Date of Birth: [birth year]  Today's Date: 06/25/2021 PT Missed Time: 22 Minutes Missed Time Reason: Patient unwilling to participate  Short Term Goals: Week 1:  PT Short Term Goal 1 (Week 1): Patient will perform basic transfers with CGA using LRAD. PT Short Term Goal 2 (Week 1): Patient will ambulate >150 ft using LRAD with min A. PT Short Term Goal 3 (Week 1): Patient will improved Berg Balance Scale by MCID (8 points) PT Short Term Goal 4 (Week 1): Patient will ascend/descend 12 step with min A using 1 rail to simulate home set-up. Week 2:  PT Short Term Goal 1 (Week 2): STG = LTG due to ELOS  Skilled Therapeutic Interventions/Progress Updates:  Pt received supine in bed and upon arrival, began aggressively shaking her head no and covering her face with her hands. Unable to get pt to acknowledge therapist and pt began turning the volume up on the TV while therapist was speaking. Turned TV off which extremely agitated pt. Pt continued to refuse therapist and turned TV back on. Missed 60 minutes of skilled PT due to pt refusal, will attempt to make up time later in day as schedule permits.   Therapy Documentation Precautions:  Precautions Precautions: Fall Precaution Comments: Pt with recent R hernia repair R inattention, aphasic, impulsive Restrictions Weight Bearing Restrictions: No   Therapy/Group: Individual Therapy Cruzita Lederer Franko Hilliker, PT, DPT  06/25/2021, 8:11 AM

## 2021-06-25 NOTE — Progress Notes (Signed)
Occupational Therapy Session Note  Patient Details  Name: Mckenzie Guerrero MRN: [id number] Date of Birth: 06-May-1951  Today's Date: 06/25/2021 OT Individual Time: 1405-1430 OT Individual Time Calculation (min): 25 min    Short Term Goals: Week 2: STGs=LTGs due to ELOS  Skilled Therapeutic Interventions/Progress Updates:    Pt semi reclined, nodding yes when asked if she needs to go to the bathroom.  Supine to sit and ambulation of 10 feet with supervision.  Toilet transfer and toileting with supervision primarily due to urination only. Pt agreeable to taking a shower and completed with supervision needing mod tactile and visual cues to sequence due to pt repetitively turning water off prematurely (with soap in hair, then without washing body).  Pt also walking out of shower without drying off needing max cues for safety and CGA to maintain balance.  Pt returned to bed and donned shirt, and pants with supervision, brief with min assist.  Semireclined in bed at end of session, call bell in reach, bed alarm on.  Therapy Documentation Precautions:  Precautions Precautions: Fall Precaution Comments: Pt with recent R hernia repair R inattention, aphasic, impulsive Restrictions Weight Bearing Restrictions: No General: General PT Missed Treatment Reason: Increased agitation;Patient unwilling to participate;Pain Vital Signs: Therapy Vitals Temp: (!) 97.5 F (36.4 C) Temp Source: Oral Pulse Rate: 67 Resp: 16 BP: 109/61 Patient Position (if appropriate): Lying Oxygen Therapy SpO2: 100 % O2 Device: Room Air Pain:   ADL: ADL Eating: Minimal assistance Grooming: Moderate assistance Upper Body Bathing: Moderate assistance Lower Body Bathing: Maximal assistance Upper Body Dressing: Minimal assistance Lower Body Dressing: Moderate assistance Toileting: Not assessed Toilet Transfer: Minimal assistance Vision   Perception    Praxis   Balance   Exercises:   Other Treatments:      Therapy/Group: Individual Therapy  Ezekiel Slocumb 06/25/2021, 2:53 PM

## 2021-06-25 NOTE — Progress Notes (Signed)
Physical Therapy Session Note  Patient Details  Name: Mckenzie Guerrero MRN: [id number] Date of Birth: Jan 09, 1951  Today's Date: 06/25/2021 PT Individual Time: 1100-1150 PT Individual Time Calculation (min): 50 min   Short Term Goals: Week 1:  PT Short Term Goal 1 (Week 1): Patient will perform basic transfers with CGA using LRAD. PT Short Term Goal 2 (Week 1): Patient will ambulate >150 ft using LRAD with min A. PT Short Term Goal 3 (Week 1): Patient will improved Berg Balance Scale by MCID (8 points) PT Short Term Goal 4 (Week 1): Patient will ascend/descend 12 step with min A using 1 rail to simulate home set-up. Week 2:  PT Short Term Goal 1 (Week 2): STG = LTG due to ELOS Week 3:     Skilled Therapeutic Interventions/Progress Updates:  10 min missed time d/t pt fatigue/refusal    Pt initially supine, agreeable to session.  Pt supine to sit w/supervision, sit to stand w/cga, pt reached into brief and removed bowel contaminated hand.  Gait 43ft to commode w/cga, total assist to lower brief as therapist intervenes to avoid futher contamination of clothing.  Pt then performs hygiene and bathing of upper thighs, perineum, buttocks w/cues, set up, min assist for thoroughness.  Wipes hands w/mod assist for thoroughness.  Dons brief w/total assist, dons pants w/min assist in sitting/standing.  Short distance gait to wc min assist then washes hands from wc level w/min assist.  Transported to 4MW to obtain clean gripper socks.  Pt dons socks independendly in sitting.    Pt transported to [place] hallway for change of scenery. Gait 175 ft x 2 w/cga, no AD, mild unsteadiness and slowed gait but no LOB, pt spontaneously scans, turns head during gait, slows when doing so.   Pt appeared fatigued.  Denied pain but confirmed "dizzyness?" Nodding yes but uncertain reliability.  Transported to room - BP 126/68.  Pt nods yes to continuing gait/attempting stairs but then ambulates to bedside.  Pt then performed  dynamic reaching in standing straightening bedding, picking up object from floor w/single UE support and min to cga.  Transferred to edge of bed w/cga.  Sit to supine w/supervision. Not amenable to continued session.  Pt left supine w/rails up x 3, alarm set, bed in lowest position, and needs in reach.    Therapy Documentation Precautions:  Precautions Precautions: Fall Precaution Comments: Pt with recent R hernia repair R inattention, aphasic, impulsive Restrictions Weight Bearing Restrictions: No    Therapy/Group: Individual Therapy Callie Fielding, [place] 06/25/2021, 12:09 PM

## 2021-06-25 NOTE — Plan of Care (Signed)
°  Problem: Consults Goal: RH STROKE PATIENT EDUCATION Description: See Patient Education module for education specifics  Outcome: Progressing   Problem: RH BLADDER ELIMINATION Goal: RH STG MANAGE BLADDER WITH ASSISTANCE Description: STG Manage Bladder With mod I Assistance Outcome: Progressing Goal: RH STG MANAGE BLADDER WITH MEDICATION WITH ASSISTANCE Description: STG Manage Bladder With Medication With mod I Assistance. Outcome: Progressing   Problem: RH SAFETY Goal: RH STG ADHERE TO SAFETY PRECAUTIONS W/ASSISTANCE/DEVICE Description: STG Adhere to Safety Precautions With cues Assistance/Device. Outcome: Progressing   Problem: RH PAIN MANAGEMENT Goal: RH STG PAIN MANAGED AT OR BELOW PT'S PAIN GOAL Description: At or below level 4 w prns Outcome: Progressing   Problem: RH KNOWLEDGE DEFICIT Goal: RH STG INCREASE KNOWLEDGE OF DIABETES Description: Patient will be able to manage DM with dietary modifications using handouts and educational resources independently Outcome: Progressing Goal: RH STG INCREASE KNOWLEDGE OF HYPERTENSION Description: Patient will be able to manage HTN with medications and dietary modifications using handouts and educational resources independently Outcome: Progressing Goal: RH STG INCREASE KNOWLEDGE OF DYSPHAGIA/FLUID INTAKE Description: Patient will be able to manage dysphagia, medications and dietary modifications using handouts and educational resources independently Outcome: Progressing Goal: RH STG INCREASE KNOWLEGDE OF HYPERLIPIDEMIA Description: Patient will be able to manage HLD with medications and dietary modifications using handouts and educational resources independently Outcome: Progressing Goal: RH STG INCREASE KNOWLEDGE OF STROKE PROPHYLAXIS Description: Patient will be able to manage secondary stroke risks with medications and dietary modifications using handouts and educational resources independently Outcome: Progressing

## 2021-06-26 DIAGNOSIS — Z515 Encounter for palliative care: Secondary | ICD-10-CM | POA: Diagnosis not present

## 2021-06-26 DIAGNOSIS — I63512 Cerebral infarction due to unspecified occlusion or stenosis of left middle cerebral artery: Secondary | ICD-10-CM | POA: Diagnosis not present

## 2021-06-26 MED ORDER — LIDOCAINE 5 % EX PTCH
1.0000 | MEDICATED_PATCH | CUTANEOUS | Status: DC
  Administered 2021-06-26 – 2021-06-29 (×4): 1 via TRANSDERMAL
  Filled 2021-06-26 (×3): qty 1

## 2021-06-26 NOTE — Progress Notes (Signed)
Occupational Therapy Session Note  Patient Details  Name: Mckenzie Guerrero MRN: [id number] Date of Birth: 1951-02-02  Today's Date: 06/26/2021 OT Individual Time: 4010-2725 OT Individual Time Calculation (min): 38 min    Short Term Goals: Week 1:  OT Short Term Goal 1 (Week 1): Pt will perform self feeding with Supervision and compensatory techniques/AE PRN OT Short Term Goal 1 - Progress (Week 1): Met OT Short Term Goal 2 (Week 1): Pt will perform shower level bathing CGA OT Short Term Goal 2 - Progress (Week 1): Met OT Short Term Goal 3 (Week 1): Pt will perform 3/3 toileting tasks with CGA OT Short Term Goal 3 - Progress (Week 1): Progressing toward goal Week 2:  OT Short Term Goal 1 (Week 2): STGs=LTS due to ELOS  Skilled Therapeutic Interventions/Progress Updates:    Pt received seated EOB with friend present, no s/sx pain throughout and appears, agreeable to therapy. Session focus on self-care retraining, activity tolerance, transfer retraining, safety awareness in prep for improved ADL/IADL/func mobility performance + decreased caregiver burden. Ambulatory toilet transfer with CGA and gestural cues for initiation. No void, but small smear of BM in brief. Pt attempting to clean and able to complete pericare with mod A for thoroughness and to avoid excessive contamination of bm. Trash can in front of pt to cue to toss soiled toilet paper in can vs floor. Total A to replace brief, but pt able to pull pants down/up. Washed hands at sink with close S.   Agreeable to shower after been shown shower. Impulsive with sit to stands from TTB and with holding shower head, requiring therapist to control for thorough cleansing. Able to stand with overall CGA and cues to wash hair, but pt able to initiate cleansing all body parts. Frequent cues for safety to dry feet prior to exiting shower.  Donned shirt with close S and increased time, donned new brief total A to adjust, donned pants with CGA. Able  to brush hair with S.  Total A w/c transport to and from gym for time management. Max multimodal cuing to initiate placing ornament on Xmas tree to target IADL retraining / functional cognition/initiation, but pt then able to complete with only S.  Pt left semi-reclined in bed with friend present, bed alarm engaged, call bell in reach, and all immediate needs met.    Therapy Documentation Precautions:  Precautions Precautions: Fall Precaution Comments: Pt with recent R hernia repair R inattention, aphasic, impulsive Restrictions Weight Bearing Restrictions: No  Pain no s/sx throughout session ADL: See Care Tool for more details. Therapy/Group: Individual Therapy  Volanda Napoleon MS, OTR/L  06/26/2021, 6:58 AM

## 2021-06-26 NOTE — Progress Notes (Addendum)
Patient ID: Mckenzie Guerrero, female   DOB: August 19, 1950, 70 y.o.   MRN: [id number]  All three son's here along with pt's friend who is here daily to go through education. All were somewhat surprised the care pt requires in the bathroom and for bathing and dressing. All feel they being her son;s can not assist with this. Discussed private duty to hire a female to do bathing and dressing. But will need to do toileting unless hire assist. Son asking regarding SNF pt has a managed care policy and they do not cover and she is too high level-supervision for this level of care. Pt's son's plan to discuss the plan and get back with this worker. Aware she will need 24/7 supervision level and should not be left alone at home. Discussed would need to go to OP since there is no home health speech therapist in [place], the one is currently on maternity leave. Son's were also talking about pt's home and the need to fix certain areas in the home, but this has been needed for a while. Palliative was also to meet with them when here, aware of the education time and when would be available after 12:00. She will touch base with them after this to discuss goals of care. Will work on safe discharge and son's aware still plan for 12/20.  Op referral sent to [facility] will call son to set up follow up appointments.

## 2021-06-26 NOTE — Progress Notes (Signed)
Occupational Therapy Session Note  Patient Details  Name: Mckenzie Guerrero MRN: [id number] Date of Birth: Apr 06, 1951  Today's Date: 06/26/2021 OT Individual Time: 0910-1004 OT Individual Time Calculation (min): 54 min    Short Term Goals: Week 2:  OT Short Term Goal 1 (Week 2): STGs=LTS due to ELOS  Skilled Therapeutic Interventions/Progress Updates:  Pt greeted  EOB  agreeable to OT intervention. Session focus on  family education with pts "riend and 3 sons. Education provided on pts overall level of assist being close supervision and the need for visual and tactile cues for safety. Education provided on pts impulsivity and need for 24 hour supervision. Family reports they are currently unsure of the plan for who will be with during the day/night but continued to reiterate the importance of recommendation of need for 24 hour. Pt completed ambulatory toilet transfer with no AD with CGA, pt continues to need MOD- MAX A for toileting as pt with impulsivity/ safety issues as pt tends to reach into soiled brief and needs cues to recall throwing toilet paper in trash can. + incontinent BM. Pts son walked with pt to ADL apt with good carryover of technique, education provided on hand holding technique for using gait belt. Pt completed simulated shower transfer with pt needing MAX visual/tactile cues to sequence body mechanics as pt in novel environment. Continued to reinforce that pt needs close supervision for shower transfer and bathing as pt with tendency to turn off water prematurely per previous notes. Sons report they are unsure who will be abel to shower with her at this time. Education provided on general definition of aphasia and importance of using multimodal cues as well taking not of her gestures to determine pts needs. Pt ambulated back to room with another son with close supervision. Pt returned to toilet for second time with another incontinent BM, MOD A for toileting tasks, and MOD A for LB  dressing and MIN A for UB dressing to don button up shirt. Pt ambulated back to recliner with supervision, SW becky enter at end of session with pt left seated in recliner with family present.                      Therapy Documentation Precautions:  Precautions Precautions: Fall Precaution Comments: Pt with recent R hernia repair R inattention, aphasic, impulsive Restrictions Weight Bearing Restrictions: No  Pain: no s/s of pain during session     Therapy/Group: Individual Therapy  Corinne Ports [facility] 06/26/2021, 12:08 PM

## 2021-06-26 NOTE — Progress Notes (Signed)
Speech Language Pathology Daily Session Note  Patient Details  Name: Mckenzie Guerrero MRN: [id number] Date of Birth: 07-20-1950  Today's Date: 06/26/2021 SLP Individual Time: 1015-1100 SLP Individual Time Calculation (min): 45 min  Short Term Goals: Week 2: SLP Short Term Goal 1 (Week 2): STG = LTG dt/ ELOS  Skilled Therapeutic Interventions: Skilled ST treatment focused on family education. Patient's friend was present for session. He stated pt's three sons had been here for previous session but had just left to go grab coffee and they did not return in time to participate in scheduled family training session. SLP did however pass sons in the hallway and offered to schedule another family education session and/or call to speak with sons by phone and they stated that would not be necessary and they did not have any questions for speech therapist. SLP however reinforced communication strategies, multimodal communication, current diet recommendations (dysphagia 3/thin), safe swallow precautions, and recommendations for outpatient or home health speech therapy. Also provided family with educational handouts on diet and aphasia/language deficits. All of this information was also discussed with patient's friend who was present for duration of session and verbalized and demonstrated understanding with therapist and via interactions with patient. Patient was left in recliner with alarm activated and immediate needs within reach at end of session. Continue per current plan of care.      Pain  No pain  Therapy/Group: Individual Therapy  Patty Sermons 06/26/2021, 2:21 PM

## 2021-06-26 NOTE — Progress Notes (Signed)
[place] PHYSICAL MEDICINE & REHABILITATION PROGRESS NOTE  Subjective/Complaints: Family present today- they have brought in her cute 82 month old dog and she is smiling and enjoying petting her.  I updated her friend at bedside regarding her progress Aphasia remains severe  ROS: Limited due to aphasia/cognition  Objective: Vital Signs: Blood pressure 113/62, pulse 67, temperature 98.9 F (37.2 C), temperature source Oral, resp. rate 14, height 5\' 2"  (1.575 m), weight 57.3 kg, SpO2 97 %. No results found. No results for input(s): WBC, HGB, HCT, PLT in the last 72 hours.  No results for input(s): NA, K, CL, CO2, GLUCOSE, BUN, CREATININE, CALCIUM in the last 72 hours.    Intake/Output Summary (Last 24 hours) at 06/26/2021 1152 Last data filed at 06/26/2021 0741 Gross per 24 hour  Intake 395 ml  Output --  Net 395 ml         Physical Exam: BP 113/62    Pulse 67    Temp 98.9 F (37.2 C) (Oral)    Resp 14    Ht 5\' 2"  (1.575 m)    Wt 57.3 kg    SpO2 97%    BMI 23.10 kg/m  General: awake, alert, eating D1 diet with NT in room; dentures don't fit- on plate;  NAD, fidgeting and grimacing at times, currently contently petting her dog HENT: conjugate gaze; oropharynx moist CV: regular rate but in afib; no JVD Pulmonary: CTA B/L; no W/R/R- good air movement GI: soft, NT, ND, (+)BS- hyperactive Psychiatric: aphasic- flat Neurological: aphasic; apraxic, agitated at times Skin: Warm and dry.  Intact. Musc: No edema in extremities.  No tenderness in extremities. Tenderness to palpation of lower back Neuro: Alert Global aphasia Nods yes to all questions, tries to say her son's name.  Unable to point or follow motor commands without tactile cues. Can follow 1-step commands Right facial weakness.  Motor: Spontaneously moves all extremities, >/3/5 throughout, ambulating CG with RW   Assessment/Plan: 1. Functional deficits which require 3+ hours per day of interdisciplinary therapy  in a comprehensive inpatient rehab setting. Physiatrist is providing close team supervision and 24 hour management of active medical problems listed below. Physiatrist and rehab team continue to assess barriers to discharge/monitor patient progress toward functional and medical goals   Care Tool:  Bathing    Body parts bathed by patient: Right arm, Left arm, Chest, Abdomen, Face, Right upper leg, Left upper leg   Body parts bathed by helper: Front perineal area, Buttocks, Right lower leg, Left lower leg     Bathing assist Assist Level: Maximal Assistance - Patient 24 - 49%     Upper Body Dressing/Undressing Upper body dressing   What is the patient wearing?: Pull over shirt    Upper body assist Assist Level: Supervision/Verbal cueing    Lower Body Dressing/Undressing Lower body dressing      What is the patient wearing?: Pants, Incontinence brief     Lower body assist Assist for lower body dressing: Moderate Assistance - Patient 50 - 74%     Toileting Toileting    Toileting assist Assist for toileting: Maximal Assistance - Patient 25 - 49%     Transfers Chair/bed transfer  Transfers assist     Chair/bed transfer assist level: Contact Guard/Touching assist     Locomotion Ambulation   Ambulation assist   Ambulation activity did not occur: Safety/medical concerns  Assist level: Contact Guard/Touching assist Assistive device: No Device Max distance: 200'   Walk 10 feet activity  Assist     Assist level: Contact Guard/Touching assist Assistive device: No Device   Walk 50 feet activity   Assist Walk 50 feet with 2 turns activity did not occur: Safety/medical concerns  Assist level: Contact Guard/Touching assist Assistive device: No Device    Walk 150 feet activity   Assist Walk 150 feet activity did not occur: Safety/medical concerns  Assist level: Contact Guard/Touching assist Assistive device: No Device    Walk 10 feet on uneven  surface  activity   Assist Walk 10 feet on uneven surfaces activity did not occur: Safety/medical concerns   Assist level: Minimal Assistance - Patient > 75%     Wheelchair     Assist Is the patient using a wheelchair?: No   Wheelchair activity did not occur: Safety/medical concerns  Wheelchair assist level: Dependent - Patient 0% (patient unable to follow-mulimodal cues for w/c propulsion) Max wheelchair distance: >150 ft    Wheelchair 50 feet with 2 turns activity    Assist        Assist Level: Dependent - Patient 0%   Wheelchair 150 feet activity     Assist      Assist Level: Dependent - Patient 0%    Medical Problem List and Plan: 1. 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  B/C vitamins added to aid stroke recovery  Continue CIR- PT, OT and SLP 2.  Impaired mobility: ambulating >300 feet: Continue eliquis 5mg  BID             -antiplatelet therapy:  ASA daily 3. Low back pain: kpad added. Add lidocaine patch. Continue scheduled Tylenol 650mg  TID. LFTs reviewed and normal 4. Agitation: LCSW to follow for evaluation and support. Continue PRN Xanax 5. Neuropsych: This patient is not capable of making decisions on her own behalf. 6. Skin/Wound Care: Routine pressure relief measures.  7. Fluids/Electrolytes/Nutrition: Monitor I/Os. 8. A fib: Will monitor BP TID--toprolol 12.5mg  resumed after discussion with pharmacy, continue, well controlled             Monitor with increased activity 9. H/o Migraines: Was on sumatriptan PTA.               Monitor for increase in migraines, especially in association with vascular headaches 10. Leucocytosis: Monitor for fevers and other signs of infection.              WBC 16.1 on 12/4, continue to monitor, repeat tomorrow.  Afebrile  No signs/symptoms of infection  11. Urinary retention? Incontinence?: continuing voiding  trial.  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  Dark-colored stools  Hemoccult negative on third read, CBC stable, likely secondary to constipation, continue mild of mag as below.  14. Decreased appetite: Continue Megace 15. Constipation: resolved with GoLytely, continue milk of mag 96mL daily 16. Disposition: HFU scheduled  LOS: 13 days A FACE TO FACE EVALUATION WAS PERFORMED  Merriel Zinger P Lugenia Assefa 06/26/2021, 11:52 AM

## 2021-06-26 NOTE — Progress Notes (Signed)
Physical Therapy Session Note  Patient Details  Name: Mckenzie Guerrero MRN: [id number] Date of Birth: [birth year]  Today's Date: 06/26/2021 PT Individual Time: 1100-1145 PT Individual Time Calculation (min): 45 min   Short Term Goals: Week 1:  PT Short Term Goal 1 (Week 1): Patient will perform basic transfers with CGA using LRAD. PT Short Term Goal 2 (Week 1): Patient will ambulate >150 ft using LRAD with min A. PT Short Term Goal 3 (Week 1): Patient will improved Berg Balance Scale by MCID (8 points) PT Short Term Goal 4 (Week 1): Patient will ascend/descend 12 step with min A using 1 rail to simulate home set-up. Week 2:  PT Short Term Goal 1 (Week 2): STG = LTG due to ELOS Week 3:     Skilled Therapeutic Interventions/Progress Updates:    Pt seen this am for session w/focus on family training w/sons, friends. Discussion included: Need for 24 hr supervision Poor safety awareness Removal of safety hazards from home Eliminating access to potential risks via Use of cabinet locks, door chimes/alarms, removal or car keys etc. Increased risk of falls esp w/current level of mobility w/current cognitive deficits, impulsivity, poor safety awareness Importance of walking program for recovery, with assistance.  Therapist then assited pt w/toileting per pt spontaneous activity and had sons observe pt performance, discussed issues occurring 12/15 during toileting including bowel incontinence, pt contamination w/feces w/lack of awareness, hygiene issues, assist required.  Therapist then assisted pt to hallway/gyms where pt ambulated >868ft total distance w/rest breaks between activities including functional gait w/obstacle course navigating around and stepping over objects,carrying objects, ascending/descending ramp, thru mulch, up/down curbs to futher demonstrate pt need for assistance w/gait at all times d/t balance deficits esp w/distractions and higher level mobility.  Discussed distractions,  fatigue, overstimulation.  Son assisted pt w/gait using gait belt.  Demonstrated understanding of pt communication deficits and redirected pt appropriately during gait activities.    Pt became agitated and appeared fatigued.  Transferre to bed w/supervision.  Sons did recognize signs of fatigue and need for rest.  Session ended 15 min early d/t pt fatigue.  Therapy Documentation Precautions:  Precautions Precautions: Fall Precaution Comments: Pt with recent R hernia repair R inattention, aphasic, impulsive Restrictions Weight Bearing Restrictions: No   Therapy/Group: Individual Therapy Callie Fielding, [place] 06/26/2021, 12:01 PM

## 2021-06-26 NOTE — Consult Note (Signed)
Palliative Medicine Inpatient Consult Note  Consulting Provider: Izora Ribas, MD  Reason for consult:   [facility] Consult  Reason for Consult? major life change   HPI:  Per intake H&P --> Mckenzie Guerrero is a 70 y.o. female with medical history significant of migraine headaches, bleeding ulcers, hypertension, tremors, hiatal hernia and GERD status post recent laparoscopic Nissen fundoplication on 31/43/8887. 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  Is at [facility] for ongoing rehabilitation.  Palliative care has been consulted in the setting of an acute stroke to further discuss goals of care with patient and her family given that this is a significant life change.  Clinical Assessment/Goals of Care:  *Please note that this is a verbal dictation therefore any spelling or grammatical errors are due to the "[place]" system interpretation.  I have reviewed medical records including EPIC notes, labs and imaging, received report from bedside RN, assessed the patient who is sitting up with her dog in her lap in no acute distress.   I met with Mckenzie Guerrero her sons Silverio Decamp, and longtime boyfriend Edison Nasuti to further discuss diagnosis prognosis, GOC, EOL wishes, disposition and options.   I introduced Palliative Medicine as specialized medical care for people living with serious illness. It focuses on providing relief from the symptoms and stress of a serious illness. The goal is to improve quality of life for both the patient and the family.  Mckenzie Guerrero is from [facility].  She has a long-term boyfriend, Edison Nasuti who she has been with for the past 22 years.  She has 3 children and 7 grandchildren.  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  She is identified as an excellent cook and was a caregiver for both her uncle and other family members.  Prior to admission Mckenzie Guerrero was  living independently in a single-family home.  She was able to perform all basic activities of daily living and instrumental activities of daily living independently.  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  We reviewed that moving forward tasks may be more difficult for her.  We discussed that her generalized weakness will be more difficult as well her expressive aphasia.  Shamikia and her family all nod when discussing this and it appears to realize the significance that this stroke will have potentially on her future from a functionality perspective.  A detailed discussion was had today regarding advanced directives, we do not have any on file.   Concepts specific to code status, artifical feeding and hydration, continued IV antibiotics and rehospitalization was had.  For the time being patient is full code/full scope of care.  Patient and her sons have agreed to discuss this further.  I did share when patients are older in age their bones are more fragile therefore it is worth considering if someone would wish to go through cardiopulmonary resuscitation or not. Provided "Hard Choices for [employer]" booklet.  Patient's family shared that they will review this and discuss it more when Mckenzie Guerrero gets home.  The difference between a aggressive medical intervention path  and a palliative comfort care path for this patient at this time was had. Values and goals of care important to patient and family were attempted to be elicited.  Patient's goals are to get to  a point where she can be functional again and provide care for herself.  Her sons expressed that it is overwhelming as they do not know what to anticipate in the future.  I offered support through empathetic listening.  Discussed the importance of continued conversation with family and their  medical providers regarding overall plan of  care and treatment options, ensuring decisions are within the context of the patients values and GOCs.  Decision Maker: Graham Doukas (son) [phone]  SUMMARY OF RECOMMENDATIONS   Full code/full scope of care  Patient's family have declined outpatient palliative support.  Palliative care will remain to peripherally follow the goals are clear for improvement  Code Status/Advance Care Planning: FULL CODE  Palliative Prophylaxis:  Mobility  Additional Recommendations (Limitations, Scope, Preferences): Continue current scope of care  Psycho-social/Spiritual:  Desire for further Chaplaincy support: Declines Additional Recommendations: Education on what to expect post stroke and the life changes that we will take place   Prognosis: Unclear.  Discharge Planning: Discharge to home.  Vitals:   06/26/21 0450 06/26/21 0744  BP: 104/68 113/62  Pulse: 61 67  Resp: 14   Temp: 98.9 F (37.2 C)   SpO2: 97%     Intake/Output Summary (Last 24 hours) at 06/26/2021 1115 Last data filed at 06/26/2021 0741 Gross per 24 hour  Intake 395 ml  Output --  Net 395 ml   Last Weight  Most recent update: 06/24/2021  5:00 AM    Weight  57.3 kg (126 lb 5.2 oz)            Gen: Elderly Caucasian woman in no acute distress HEENT: moist mucous membranes CV: Regular rate and rhythm  PULM: On room air ABD: soft/nontender  EXT: No edema  Neuro: Alert and oriented cannot yes or no to questions  PPS: 60%   This conversation/these recommendations were discussed with patient primary care team, Dr. Ranell Patrick  Time In: 1015 Time Out: 1130 Total Time: 75 Greater than 50%  of this time was spent counseling and coordinating care related to the above assessment and plan.  [place] Team Team Cell Phone: [phone] Please utilize secure chat with additional questions, if there is no response within 30 minutes please call the above phone  number  Palliative Medicine Team providers are available by phone from 7am to 7pm daily and can be reached through the team cell phone.  Should this patient require assistance outside of these hours, please call the patient's attending physician.

## 2021-06-27 DIAGNOSIS — Z515 Encounter for palliative care: Secondary | ICD-10-CM | POA: Diagnosis not present

## 2021-06-27 NOTE — Progress Notes (Signed)
° °  Palliative Medicine Inpatient Follow Up Note   Consulting Provider: Izora Ribas, MD   Reason for consult:   [facility] Consult  Reason for Consult? major life change    HPI:  Per intake H&P --> Mckenzie Guerrero is a 70 y.o. female with medical history significant of migraine headaches, bleeding ulcers, hypertension, tremors, hiatal hernia and GERD status post recent laparoscopic Nissen fundoplication on 09/32/6712. 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  Is at [facility] for ongoing rehabilitation.   Palliative care has been consulted in the setting of an acute stroke to further discuss goals of care with patient and her family given that this is a significant life change.  Today's Discussion (06/27/2021):  *Please note that this is a verbal dictation therefore any spelling or grammatical errors are due to the "[place] [employer]" system interpretation.  I met with [name] at bedside this morning.  She was well-appearing and was in no distress.  I asked her if she had any pain which she shook her head no, I asked her if she had any difficulty breathing which she shook her head no, I asked her if she had any nausea which she shook her head no.  I reviewed the purpose of palliative care in her case.  Discussed the plan for her to discharge home as early as Tuesday.  Provided palliative support.  No family was present at bedside at the time of my visit though I will reach out to patient's son, Mckenzie Guerrero to verify he has no additional questions.  Objective Assessment: Vital Signs Vitals:   06/27/21 0355 06/27/21 0855  BP: 112/71 107/74  Pulse: (!) 57 62  Resp: 14   Temp: 98.2 F (36.8 C)   SpO2: 97%     Intake/Output Summary (Last 24 hours) at 06/27/2021 4580 Last data filed at 06/27/2021 9983 Gross per 24 hour  Intake 236 ml  Output --  Net 236 ml   Last Weight  Most recent update: 06/24/2021  5:00 AM    Weight  57.3 kg (126 lb 5.2  oz)            Gen: Elderly Caucasian woman in no acute distress HEENT: moist mucous membranes CV: Regular rate and rhythm  PULM: On room air ABD: soft/nontender  EXT: No edema  Neuro: Alert and oriented cannot yes or no to questions  SUMMARY OF RECOMMENDATIONS   Full code/full scope of care --> Provided  "Hard Choices for [employer]" booklet to patients family   Patient's family have declined outpatient palliative support as they feel they have our number if additional needs arise   Palliative care will remain to peripherally follow - The goals are clear for improvement  Time Spent: 15 Greater than 50% of the time was spent in counseling and coordination of care ______________________________________________________________________________________ [place] Team Team Cell Phone: [phone] Please utilize secure chat with additional questions, if there is no response within 30 minutes please call the above phone number  Palliative Medicine Team providers are available by phone from 7am to 7pm daily and can be reached through the team cell phone.  Should this patient require assistance outside of these hours, please call the patient's attending physician.

## 2021-06-27 NOTE — Progress Notes (Signed)
Occupational Therapy Discharge Summary  Patient Details  Name: Mckenzie Guerrero MRN: [id number] Date of Birth: 29-Dec-1950  Patient has met 12 of 12 long term goals due to improved activity tolerance, improved balance, postural control, ability to compensate for deficits, functional use of  RIGHT upper and RIGHT lower extremity, improved attention, improved awareness, and improved coordination.  Patient to discharge at overall Supervision level.  Patient's care partner is independent to provide the necessary physical and cognitive assistance at discharge.  Family education has been completed.   Reasons goals not met: All goals met.   Recommendation:  Patient will benefit from ongoing skilled OT services in home health setting to continue to advance functional skills in the area of BADL and iADL.  Equipment: No equipment provided  Reasons for discharge: treatment goals met and discharge from hospital  Patient/family agrees with progress made and goals achieved: Yes  OT Discharge Precautions/Restrictions  Precautions Precautions: Fall Precaution Comments: Expressive > receptive aphasia, R inattention Restrictions Weight Bearing Restrictions: No Pain Pain Assessment Pain Scale: 0-10 Pain Score: 0-No pain ADL ADL Eating: Supervision/safety Where Assessed-Eating: Bed level Grooming: Supervision/safety Where Assessed-Grooming: Sitting at sink Upper Body Bathing: Supervision/safety Where Assessed-Upper Body Bathing: Shower Lower Body Bathing: Supervision/safety Where Assessed-Lower Body Bathing: Shower Upper Body Dressing: Supervision/safety Where Assessed-Upper Body Dressing: Edge of bed Lower Body Dressing: Supervision/safety Where Assessed-Lower Body Dressing: Edge of bed Toileting: Supervision/safety Where Assessed-Toileting: Glass blower/designer: Close supervision Toilet Transfer Method: Magazine features editor: Close supervision Social research officer, government  Method: Heritage manager: Gaffer Baseline Vision/History: 0 No visual deficits Patient Visual Report: No change from baseline Vision Assessment?: Vision impaired- to be further tested in functional context;Yes Eye Alignment: Within Functional Limits Ocular Range of Motion: Within Functional Limits Tracking/Visual Pursuits: Requires cues, head turns, or add eye shifts to track Perception  Perception: Impaired Inattention/Neglect: Does not attend to right visual field Praxis Praxis: Impaired Praxis Impairment Details: Initiation;Motor planning Cognition Overall Cognitive Status: Impaired/Different from baseline Arousal/Alertness: Awake/alert Orientation Level: Oriented X4 Year:  (globally aphasic) Month:  (aphasic) Day of Week:  (aphasic) Attention: Focused;Sustained Focused Attention: Appears intact Focused Attention Impairment: Functional basic Sustained Attention: Appears intact Sustained Attention Impairment: Functional basic Memory: Impaired Immediate Memory Recall: Sock;Blue;Bed (unable to complete) Awareness: Impaired Awareness Impairment: Intellectual impairment Problem Solving: Impaired Problem Solving Impairment: Functional basic Safety/Judgment: Impaired Comments: pt remains mostly nonverbal. unable to assess cognitive function at this time 2' aphasia Sensation Sensation Light Touch: Impaired by gross assessment Peripheral sensation comments: light touch absent on R Hot/Cold: Appears Intact Proprioception: Appears Intact Stereognosis: Not tested Coordination Gross Motor Movements are Fluid and Coordinated: Yes Fine Motor Movements are Fluid and Coordinated: Yes Coordination and Movement Description: generalized weakness Motor  Motor Motor: Abnormal postural alignment and control Mobility  Bed Mobility Bed Mobility: Rolling Right;Rolling Left;Sit to Supine;Supine to Sit Rolling Right: Independent Rolling Left:  Independent Supine to Sit: Independent Sitting - Scoot to Edge of Bed: Independent Sit to Supine: Independent Transfers Sit to Stand: Supervision/Verbal cueing Stand to Sit: Supervision/Verbal cueing  Trunk/Postural Assessment  Cervical Assessment Cervical Assessment: Exceptions to [facility] (forward head) Thoracic Assessment Thoracic Assessment: Exceptions to [facility] (rounded shoulders) Lumbar Assessment Lumbar Assessment: Exceptions to [facility] (posterior pelvic tilt) Postural Control Postural Control: Within Functional Limits  Balance Balance Balance Assessed: Yes Static Sitting Balance Static Sitting - Balance Support: No upper extremity supported;Feet supported Static Sitting - Level of Assistance: 7: Independent Dynamic Sitting Balance Dynamic Sitting - Balance  Support: Feet supported;During functional activity Dynamic Sitting - Level of Assistance: 7: Independent Static Standing Balance Static Standing - Balance Support: During functional activity;No upper extremity supported Static Standing - Level of Assistance: 5: Stand by assistance Dynamic Standing Balance Dynamic Standing - Balance Support: During functional activity Dynamic Standing - Level of Assistance: 5: Stand by assistance Extremity/Trunk Assessment RUE Assessment RUE Assessment: Exceptions to [facility] Active Range of Motion (AROM) Comments: WFL for ADL, but limited to approx 100* shoulder flexion RUE Body System: Neuro Brunstrum levels for arm and hand: Hand;Arm Brunstrum level for arm: Stage V Relative Independence from Synergy Brunstrum level for hand: Stage VI Isolated joint movements LUE Assessment LUE Assessment: Within Functional Limits   Curtis Sites 06/29/2021, 3:25 PM

## 2021-06-27 NOTE — Progress Notes (Signed)
Occupational Therapy Session Note  Patient Details  Name: Mckenzie Guerrero MRN: [id number] Date of Birth: [birth year]  Today's Date: 06/28/2021 OT Individual Time: 0459-9774 OT Individual Time Calculation (min): 31 min   Skilled Therapeutic Interventions/Progress Updates:    Pt greeted in bed with family present. No s/s pain. Pt responding "yeah" and "kay" throughout session, nodded often. Used visual and tactile cues heavily for instruction/cuing. She ambulated without AD and CGA to the toilet. Pt with small BM and initiated hygiene herself, either throwing sanitary paper into toilet or trash bin. She then initiated removing clothing and transferring to the shower. Pt bathed at sit<stand level, noted that she had a BM on the bench with no awareness of it. Pt also used the hand held shower hose to cleanse her bottom, BM residue was left on the hose with poor awareness from pt. She also tried to use wash cloths soiled with BM to wash her face. After drying off, pt transferred to the toilet to dress. She also had another small BM, tried to wipe herself after lifting her new brief over hips. She was also adamant about standing to don her pants, ultimately agreeable to sit to do this after several unsuccessful attempts and pts impatience with this aspect of task. Hand washing completed while standing in front of the sink after. OT assisted with hair combing before pt returned to bed. She remained in bed at close of session, all needs within reach, 4 bedrails up, in company of her boyfriend and holding her puppy from home.   Therapy Documentation Precautions:  Precautions Precautions: Fall Precaution Comments: Pt with recent R hernia repair R inattention, aphasic, impulsive Restrictions Weight Bearing Restrictions: No ADL: ADL Eating: Minimal assistance Grooming: Moderate assistance Upper Body Bathing: Moderate assistance Lower Body Bathing: Maximal assistance Upper Body Dressing: Minimal  assistance Lower Body Dressing: Moderate assistance Toileting: Not assessed Toilet Transfer: Minimal assistance  Therapy/Group: Individual Therapy  Gabbi Whetstone A Haasini Patnaude 06/28/2021, 4:26 PM

## 2021-06-27 NOTE — Plan of Care (Signed)
°  Problem: RH Toileting Goal: LTG Patient will perform toileting task (3/3 steps) with assistance level (OT) Description: LTG: Patient will perform toileting task (3/3 steps) with assistance level (OT)  Flowsheets (Taken 06/27/2021 1700) LTG: Pt will perform toileting task (3/3 steps) with assistance level: Maximal Assistance - Patient 25 - 49% Note: Downgrade due to lack of progress.

## 2021-06-28 DIAGNOSIS — I63512 Cerebral infarction due to unspecified occlusion or stenosis of left middle cerebral artery: Secondary | ICD-10-CM | POA: Diagnosis not present

## 2021-06-28 NOTE — Progress Notes (Signed)
Physical Therapy Session Note  Patient Details  Name: Mckenzie Guerrero MRN: [id number] Date of Birth: 06-09-1951  Today's Date: 06/28/2021 PT Individual Time: 1303-1400 PT Individual Time Calculation (min): 57 min   Short Term Goals: Week 2:  PT Short Term Goal 1 (Week 2): STG = LTG due to ELOS  Skilled Therapeutic Interventions/Progress Updates: Pt presented at EOB with SO present. Pt appeared calm and relaxed therefore agreeable to have SO with pt through session.  Discussed with SO current cognitive deficits and continues to have limited attention to task, poor safety awareness, etc. Pt ambulated to rehab gym with close supervision and participated in card matching game where pt was able to correctly place 7/9 cards without cues. Pt then participated in a round of horseshoes attempting to alternate BUE however it became more 2 then 2. Pt was able to collect all items off floor with CGA and no LOB. Pt appeared visibly fatigued therefore PTA set up task of using Lego blocks for Level 1 picture (4 blocks) pt was unable to re-create image despite max cues. Pt only able to stack blocks. PTA then set up sorting task (color bugs) which pt was able to complete ~75% with min cues. Pt ambulated to day room supervision with pt's son and DIL present. Pt set up on NuStep and was able to maintain sustained task of NuStep L3 x 9 min averaging 50-60 SPM. While pt on NuStep pt's DIL inquiring about possible resources and assist for pt upon d/c. Adv that per notes family education provided on Fri with PTA reiterating importance of supervision due to current cognitive deficits as well as LSW providing private duty caregiver listings. DIL verbalized understanding of necessity of supervision with PTA suggesting reaching out to neighbors and possibly church (if an option) for possible additional assistance. Once completed pt was able to ambulate back to room and find correct room without cues. Pt returned to bed at end of  session supervision. Pt left in bed with bed alarm on, call bell within reach and family present.      Therapy Documentation Precautions:  Precautions Precautions: Fall Precaution Comments: Pt with recent R hernia repair R inattention, aphasic, impulsive Restrictions Weight Bearing Restrictions: No General:   Vital Signs: Therapy Vitals Temp: 98 F (36.7 C) Temp Source: Oral Pulse Rate: 90 Resp: 15 BP: 113/70 Patient Position (if appropriate): Sitting Oxygen Therapy SpO2: 99 % O2 Device: Room Air Pain:   Mobility:   Locomotion :    Trunk/Postural Assessment :    Balance:   Exercises:   Other Treatments:      Therapy/Group: Individual Therapy  Jaquesha Boroff 06/28/2021, 4:13 PM

## 2021-06-28 NOTE — Progress Notes (Addendum)
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 rate 18, height 5\' 2"  (1.575 m), weight 57.3 kg, SpO2 98 %. No results found. No results for input(s): WBC, HGB, HCT, PLT in the last 72 hours.  No results for input(s): NA, K, CL, CO2, GLUCOSE, BUN, CREATININE, CALCIUM in the last 72 hours.    Intake/Output Summary (Last 24 hours) at 06/28/2021 1121 Last data filed at 06/28/2021 0730 Gross per 24 hour  Intake 236 ml  Output --  Net 236 ml          Physical Exam: BP 91/72    Pulse (!) 57    Temp 98.4 F (36.9 C) (Oral)    Resp 18    Ht 5\' 2"  (1.575 m)    Wt 57.3 kg    SpO2 98%    BMI 23.10 kg/m   General: No acute distress Mood and affect are appropriate Heart: Regular rate and rhythm no rubs murmurs or extra sounds Lungs: Clear to auscultation, breathing unlabored, no rales or wheezes Abdomen: Positive bowel sounds, soft nontender to palpation, nondistended Extremities: No clubbing, cyanosis, or edema Skin: No evidence of breakdown, no evidence of rash   Skin: Warm and dry.  Intact. Musc: No edema in extremities.  No tenderness in extremities. Tenderness to palpation of lower back Neuro: Alert Global aphasia Nods yes to all questions, tries to say her son's name.  Unable to point or follow motor commands without tactile cues. Can follow 1-step commands Right facial weakness.  Motor: Spontaneously moves all extremities, >/3/5 throughout, ambulating CG with RW   Assessment/Plan: 1. Functional deficits which require 3+ hours per day of interdisciplinary therapy in a comprehensive inpatient rehab setting. Physiatrist is  providing close team supervision and 24 hour management of active medical problems listed below. Physiatrist and rehab team continue to assess barriers to discharge/monitor patient progress toward functional and medical goals   Care Tool:  Bathing    Body parts bathed by patient: Right arm, Left arm, Chest, Abdomen, Face, Right upper leg, Left upper leg, Front perineal area, Buttocks, Right lower leg, Left lower leg   Body parts bathed by helper: Front perineal area, Buttocks, Right lower leg, Left lower leg     Bathing assist Assist Level: Contact Guard/Touching assist     Upper Body Dressing/Undressing Upper body dressing   What is the patient wearing?: Pull over shirt    Upper body assist Assist Level: Supervision/Verbal cueing    Lower Body Dressing/Undressing Lower body dressing      What is the patient wearing?: Pants, Incontinence brief     Lower body assist Assist for lower body dressing: Moderate Assistance - Patient 50 - 74%     Toileting Toileting    Toileting assist Assist for toileting: Moderate Assistance - Patient 50 - 74%     Transfers Chair/bed transfer  Transfers assist     Chair/bed transfer assist level: Contact Guard/Touching assist     Locomotion Ambulation   Ambulation assist   Ambulation activity did not occur: Safety/medical concerns  Assist level: Contact Guard/Touching assist Assistive device: No Device Max distance: 200'   Walk 10 feet activity   Assist  Assist level: Contact Guard/Touching assist Assistive device: No Device   Walk 50 feet activity   Assist Walk 50 feet with 2 turns activity did not occur: Safety/medical concerns  Assist level: Contact Guard/Touching assist Assistive device: No Device    Walk 150 feet activity   Assist Walk 150 feet activity did not occur: Safety/medical concerns  Assist level: Contact Guard/Touching assist Assistive device: No Device    Walk 10 feet on uneven surface   activity   Assist Walk 10 feet on uneven surfaces activity did not occur: Safety/medical concerns   Assist level: Minimal Assistance - Patient > 75%     Wheelchair     Assist Is the patient using a wheelchair?: No   Wheelchair activity did not occur: Safety/medical concerns  Wheelchair assist level: Dependent - Patient 0% (patient unable to follow-mulimodal cues for w/c propulsion) Max wheelchair distance: >150 ft    Wheelchair 50 feet with 2 turns activity    Assist        Assist Level: Dependent - Patient 0%   Wheelchair 150 feet activity     Assist      Assist Level: Dependent - Patient 0%    Medical Problem List and Plan: 1. 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  ELOS- 06/30/21  Continue CIR- PT, OT and SLP 2.  Impaired mobility: ambulating >300 feet: Continue eliquis 5mg  BID             -antiplatelet therapy:  ASA daily 3. Low back pain: kpad added. Add lidocaine patch. Continue scheduled Tylenol 650mg  TID. LFTs reviewed and normal 4. Agitation: LCSW to follow for evaluation and support. Continue PRN Xanax 5. Neuropsych: This patient is not capable of making decisions on her own behalf. 6. Skin/Wound Care: Routine pressure relief measures.  7. Fluids/Electrolytes/Nutrition: Monitor I/Os. 8. A fib: Will monitor BP TID--toprolol 12.5mg  resumed after discussion with pharmacy, continue, well controlled           Vitals:   06/28/21 0527 06/28/21 0828  BP: 113/66 91/72  Pulse: 62 (!) 57  Resp: 18   Temp: 98.4 F (36.9 C)   SpO2: 98%   Rate controlled mild brady- cont current dose   9. H/o Migraines: Was on sumatriptan PTA.               Monitor for increase in migraines, especially in association with vascular headaches 10. Leucocytosis: Monitor for fevers and other signs of infection.              WBC 16.1 on 12/4, continue to monitor, repeat  tomorrow.  Afebrile  No signs/symptoms of infection  11. Urinary retention? Incontinence?: continuing voiding trial.  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  Dark-colored stools  Hemoccult negative on third read, CBC stable, likely secondary to constipation, continue mild of mag as below.  14. Decreased appetite: Continue Megace 15. Constipation: resolved with GoLytely, continue milk of mag 71mL daily 16. Disposition: HFU scheduled  LOS: 15 days A FACE TO FACE EVALUATION WAS PERFORMED  Charlett Blake 06/28/2021, 11:21 AM

## 2021-06-28 NOTE — Plan of Care (Signed)
°  Problem: Consults Goal: RH STROKE PATIENT EDUCATION Description: See Patient Education module for education specifics  Outcome: Progressing   Problem: RH BLADDER ELIMINATION Goal: RH STG MANAGE BLADDER WITH ASSISTANCE Description: STG Manage Bladder With mod I Assistance Outcome: Progressing Goal: RH STG MANAGE BLADDER WITH MEDICATION WITH ASSISTANCE Description: STG Manage Bladder With Medication With mod I Assistance. Outcome: Progressing   Problem: RH SAFETY Goal: RH STG ADHERE TO SAFETY PRECAUTIONS W/ASSISTANCE/DEVICE Description: STG Adhere to Safety Precautions With cues Assistance/Device. Outcome: Progressing   Problem: RH PAIN MANAGEMENT Goal: RH STG PAIN MANAGED AT OR BELOW PT'S PAIN GOAL Description: At or below level 4 w prns Outcome: Progressing   Problem: RH KNOWLEDGE DEFICIT Goal: RH STG INCREASE KNOWLEDGE OF DIABETES Description: Patient will be able to manage DM with dietary modifications using handouts and educational resources independently Outcome: Progressing Goal: RH STG INCREASE KNOWLEDGE OF HYPERTENSION Description: Patient will be able to manage HTN with medications and dietary modifications using handouts and educational resources independently Outcome: Progressing Goal: RH STG INCREASE KNOWLEDGE OF DYSPHAGIA/FLUID INTAKE Description: Patient will be able to manage dysphagia, medications and dietary modifications using handouts and educational resources independently Outcome: Progressing Goal: RH STG INCREASE KNOWLEGDE OF HYPERLIPIDEMIA Description: Patient will be able to manage HLD with medications and dietary modifications using handouts and educational resources independently Outcome: Progressing Goal: RH STG INCREASE KNOWLEDGE OF STROKE PROPHYLAXIS Description: Patient will be able to manage secondary stroke risks with medications and dietary modifications using handouts and educational resources independently Outcome: Progressing

## 2021-06-29 ENCOUNTER — Other Ambulatory Visit (HOSPITAL_COMMUNITY): Payer: Self-pay

## 2021-06-29 DIAGNOSIS — I63512 Cerebral infarction due to unspecified occlusion or stenosis of left middle cerebral artery: Secondary | ICD-10-CM | POA: Diagnosis not present

## 2021-06-29 LAB — CBC
HCT: 40.2 % (ref 36.0–46.0)
Hemoglobin: 12.6 g/dL (ref 12.0–15.0)
MCH: 29.8 pg (ref 26.0–34.0)
MCHC: 31.3 g/dL (ref 30.0–36.0)
MCV: 95 fL (ref 80.0–100.0)
Platelets: 428 10*3/uL — ABNORMAL HIGH (ref 150–400)
RBC: 4.23 MIL/uL (ref 3.87–5.11)
RDW: 13.7 % (ref 11.5–15.5)
WBC: 14.1 10*3/uL — ABNORMAL HIGH (ref 4.0–10.5)
nRBC: 0 % (ref 0.0–0.2)

## 2021-06-29 LAB — BASIC METABOLIC PANEL
Anion gap: 8 (ref 5–15)
BUN: 22 mg/dL (ref 8–23)
CO2: 25 mmol/L (ref 22–32)
Calcium: 9.5 mg/dL (ref 8.9–10.3)
Chloride: 105 mmol/L (ref 98–111)
Creatinine, Ser: 0.82 mg/dL (ref 0.44–1.00)
GFR, Estimated: >60 mL/min (ref >60)
Glucose, Bld: 99 mg/dL (ref 70–99)
Potassium: 4.2 mmol/L (ref 3.5–5.1)
Sodium: 138 mmol/L (ref 135–145)

## 2021-06-29 MED ORDER — APIXABAN 5 MG PO TABS
5.0000 mg | ORAL_TABLET | Freq: Two times a day (BID) | ORAL | 0 refills | Status: DC
  Filled 2021-06-29: qty 60, 30d supply, fill #0

## 2021-06-29 MED ORDER — LIDOCAINE 5 % EX PTCH
1.0000 | MEDICATED_PATCH | CUTANEOUS | 0 refills | Status: DC
  Filled 2021-06-29: qty 30, 30d supply, fill #0

## 2021-06-29 MED ORDER — B COMPLEX-C PO TABS
1.0000 | ORAL_TABLET | Freq: Every day | ORAL | 0 refills | Status: DC
  Filled 2021-06-29: qty 30, 30d supply, fill #0

## 2021-06-29 MED ORDER — MEGESTROL ACETATE 40 MG/ML PO SUSP
400.0000 mg | Freq: Every day | ORAL | 1 refills | Status: DC
  Filled 2021-06-29: qty 300, 30d supply, fill #0

## 2021-06-29 MED ORDER — ACETAMINOPHEN 325 MG PO TABS
650.0000 mg | ORAL_TABLET | Freq: Three times a day (TID) | ORAL | 0 refills | Status: DC
  Filled 2021-06-29: qty 60, 10d supply, fill #0

## 2021-06-29 MED ORDER — ATORVASTATIN CALCIUM 40 MG PO TABS
40.0000 mg | ORAL_TABLET | Freq: Every day | ORAL | 0 refills | Status: DC
  Filled 2021-06-29: qty 30, 30d supply, fill #0

## 2021-06-29 MED ORDER — GERHARDT'S BUTT CREAM
1.0000 "application " | TOPICAL_CREAM | Freq: Three times a day (TID) | CUTANEOUS | 0 refills | Status: DC
  Filled 2021-06-29: qty 1, 1d supply, fill #0

## 2021-06-29 MED ORDER — METOPROLOL SUCCINATE ER 25 MG PO TB24
12.5000 mg | ORAL_TABLET | Freq: Every day | ORAL | 0 refills | Status: DC
  Filled 2021-06-29: qty 15, 30d supply, fill #0

## 2021-06-29 NOTE — Discharge Summary (Signed)
Physical Therapy Discharge Summary  Patient Details  Name: Mckenzie Guerrero MRN: [id number] Date of Birth: [birth year]  Patient has met 10 of 10 long term goals due to improved activity tolerance, improved balance, improved postural control, increased strength, ability to compensate for deficits, improved attention, and improved awareness.  Patient to discharge at an ambulatory level Supervision.   Patient's care partner is independent to provide the necessary physical and cognitive assistance at discharge.  Reasons goals not met: n/a  Recommendation:  Patient will benefit from ongoing skilled PT services in outpatient setting to continue to advance safe functional mobility, address ongoing impairments in safety awareness, gait, balance, global deconditioning, aphasia, motor planning, and minimize fall risk.  Equipment: RW  Reasons for discharge: treatment goals met and discharge from hospital  Patient/family agrees with progress made and goals achieved: Yes  PT Discharge Precautions/Restrictions Precautions Precautions: Fall Precaution Comments: Expressive > receptive aphasia, R inattention Restrictions Weight Bearing Restrictions: No Pain Interference Pain Interference Pain Effect on Sleep: 8. Unable to answer Pain Interference with Therapy Activities: 8. Unable to answer Pain Interference with Day-to-Day Activities: 8. Unable to answer Vision/Perception  Vision - History Ability to See in Adequate Light: 0 Adequate Vision - Assessment Eye Alignment: Within Functional Limits Ocular Range of Motion: Within Functional Limits Tracking/Visual Pursuits: Requires cues, head turns, or add eye shifts to track Perception Perception: Impaired Inattention/Neglect: Does not attend to right visual field Praxis Praxis: Impaired Praxis Impairment Details: Initiation;Motor planning  Cognition Overall Cognitive Status: Impaired/Different from baseline Arousal/Alertness:  Awake/alert Orientation Level: Oriented to person Attention: Focused;Sustained Focused Attention: Appears intact Focused Attention Impairment: Functional basic Sustained Attention: Appears intact Sustained Attention Impairment: Functional basic Awareness: Impaired Awareness Impairment: Intellectual impairment Problem Solving: Impaired Problem Solving Impairment: Functional basic Safety/Judgment: Impaired Sensation Sensation Light Touch: Impaired by gross assessment Peripheral sensation comments: light touch absent on R Hot/Cold: Appears Intact Proprioception: Appears Intact Stereognosis: Not tested Coordination Gross Motor Movements are Fluid and Coordinated: Yes Heel Shin Test: Unable to follow verbal and visual cues Motor  Motor Motor: Abnormal postural alignment and control  Mobility Bed Mobility Bed Mobility: Rolling Right;Rolling Left;Sit to Supine;Supine to Sit Rolling Right: Independent Rolling Left: Independent Supine to Sit: Independent Sitting - Scoot to Edge of Bed: Independent Sit to Supine: Independent Transfers Transfers: Sit to Stand;Stand to [employer] Transfers Sit to Stand: Supervision/Verbal cueing Stand to Sit: Supervision/Verbal cueing Stand Pivot Transfers: Supervision/Verbal cueing Stand Pivot Transfer Details: Verbal cues for precautions/safety Transfer (Assistive device): None Locomotion  Gait Ambulation: Yes Gait Assistance: Contact Guard/Touching assist;Supervision/Verbal cueing Gait Distance (Feet): 200 Feet Assistive device: Rolling walker;None Gait Assistance Details: Verbal cues for gait pattern;Verbal cues for safe use of DME/AE;Verbal cues for precautions/safety Gait Gait: Yes Gait Pattern: Impaired Gait Pattern: Narrow base of support;Lateral trunk lean to right;Decreased hip/knee flexion - right;Step-through pattern (veering with fatigue) Stairs / Additional Locomotion Stairs: Yes Stairs Assistance: Contact Guard/Touching  assist Stair Management Technique: Two rails Number of Stairs: 12 Height of Stairs: 6  Trunk/Postural Assessment  Cervical Assessment Cervical Assessment: Exceptions to [facility] (forward head) Thoracic Assessment Thoracic Assessment: Exceptions to [facility] (kyphosis and rounded shoulders) Lumbar Assessment Lumbar Assessment: Exceptions to [facility] (posterior pelvic tilt) Postural Control Postural Control: Within Functional Limits  Balance Balance Balance Assessed: Yes Static Sitting Balance Static Sitting - Balance Support: No upper extremity supported;Feet supported Static Sitting - Level of Assistance: 7: Independent Dynamic Sitting Balance Dynamic Sitting - Balance Support: Feet supported;During functional activity Dynamic Sitting - Level of  Assistance: 7: Independent Static Standing Balance Static Standing - Balance Support: During functional activity;No upper extremity supported Static Standing - Level of Assistance: 5: Stand by assistance Dynamic Standing Balance Dynamic Standing - Balance Support: During functional activity Dynamic Standing - Level of Assistance: 5: Stand by assistance Dynamic Standing - Balance Activities: Forward lean/weight shifting;Reaching for objects;Ball toss *unable to assess using functional outcome measures due to aphasia  Extremity Assessment      RLE Assessment RLE Assessment: Exceptions to [facility] General Strength Comments: Grossly 4/5 RLE Strength RLE Overall Strength: Other (Comment) RLE Overall Strength Comments: Unable to formally assess MMT due to aphasia LLE Assessment LLE Assessment: Exceptions to [facility] General Strength Comments: Grossly 4/5 LLE Strength LLE Overall Strength: Deficits LLE Overall Strength Comments: Unable to formally assess MMT due to [place] PT 06/29/2021, 7:50 AM

## 2021-06-29 NOTE — Progress Notes (Addendum)
Inpatient Rehabilitation Care Coordinator Discharge Note   Patient Details  Name: Mckenzie Guerrero MRN: [id number] Date of Birth: 21-Nov-1950   Discharge location: HOME WITH BOYFRIEND AND THREE SON'S TO ASSIST BETWEEN AWARE 24/7 SUPERVISION  Length of Stay: 17 DAYS  Discharge activity level: SUPERVISION-CGA LEVEL  Home/community participation: ACTIVE  Patient response ID:[id number] Literacy - How often do you need to have someone help you when you read instructions, pamphlets, or other written material from your doctor or pharmacy?: Never  Patient response [place]:[id number] Isolation - How often do you feel lonely or isolated from those around you?: Sometimes  Services provided included: MD, RD, PT, OT, SLP, RN, CM, TR, Pharmacy, SW  Financial Services:  Financial Services Utilized: Medicaid UHC-MEDICARE  Choices offered to/list presented to: PT AND SON'S  Follow-up services arranged:  Outpatient, DME, Patient/Family has no preference for HH/DME agencies    Outpatient Servicies: [place] TO SET UP APPOINTMENTS DME : [place] & 3 in 1. PT  DOES NOT HAVE FULL MEDICAID ONLY [facility] WHICH COVERS CO-PAY FROM MEDICARE. WILL CONTACT SON TO INFORM. AWARE SNF NOT COVERED SINCE CAME TO REHAB AND IF COMES BACK INTO THE HOSPITAL THEN WOULD HAVE THAT OPTION.    Patient response to transportation need: Is the patient able to respond to transportation needs?: Yes In the past 12 months, has lack of transportation kept you from medical appointments or from getting medications?: No In the past 12 months, has lack of transportation kept you from meetings, work, or from getting things needed for daily living?: No    Comments (or additional information): ALL THREE SON'S WERE HERE FOR EDUCATION AND SIGNIFICANT OTHER HERE DAILY TO SEE PROGRESS IN THERAPIES. FAMILY AWARE PT WILL NEED 24/7 CARE DUE TO APHASIA AND SAFETY ISSUES. GAVE PRIVATE DUTY  LIST AND HAVE MADE REFERRAL FOR PCS SERVICES. AWARE OF SAFETY RISK AND FALL RISK PT WILL BE AT HOME IF SOMEONE IS NOT WITH HIM  Patient/Family verbalized understanding of follow-up arrangements:  Yes  Individual responsible for coordination of the follow-up plan: Mckenzie Guerrero [phone]  Confirmed correct DME delivered: Mckenzie Guerrero 06/29/2021    Mckenzie Guerrero, Mckenzie Guerrero

## 2021-06-29 NOTE — Progress Notes (Signed)
Occupational Therapy Session Note  Patient Details  Name: Mckenzie Guerrero MRN: [id number] Date of Birth: 1951-01-28  Today's Date: 06/29/2021 OT Individual Time: 1300-1340 OT Individual Time Calculation (min): 40 min  and Today's Date: 06/29/2021 OT Missed Time: 20 Minutes Missed Time Reason: Patient fatigue   Short Term Goals: Week 2:  OT Short Term Goal 1 (Week 2): STGs=LTS due to ELOS  Skilled Therapeutic Interventions/Progress Updates:    Pt received supine with no obvious c/o pain or facial indications. Pt completed bed mobility with (S), then ambulatory transfer into the bathroom with close (S). She completed toileting tasks, but no void, with close (S). Transfer into walk in shower with (S). She completed all bathing in automatic fashion, however unable to break routine when OT provided cueing (I.e. used shampoo as body wash). She required cueing for safety in shower and general fall risk reduction strategies. Dressing EOB with close (S). Pt returned to EOB with supervision and refused to leave to complete any other activity. Brought in functional food items and attempted to have her sort them into two simple categories. Pt required mod cueing for 75% accuracy overall. Pt declined any further activity and was left supine with all needs met. 20 min missed.    Therapy Documentation Precautions:  Precautions Precautions: Fall Precaution Comments: Pt with recent R hernia repair R inattention, aphasic, impulsive Restrictions Weight Bearing Restrictions: No  Therapy/Group: Individual Therapy  Curtis Sites 06/29/2021, 6:21 AM

## 2021-06-29 NOTE — Plan of Care (Signed)
Problem: RH Expression Communication Goal: LTG Patient will express needs/wants via multi-modal(SLP) Description: LTG:  Patient will express needs/wants via multi-modal communication (gestures/written, etc) with cues (SLP) Outcome: Not Met (add Reason) Flowsheets (Taken 06/29/2021 1610) LTG: Patient will express needs/wants via multimodal communication (gestures/written, etc) with cueing (SLP): Maximal Assistance - Patient 25 - 49% Note: Max A for severe expressive aphasia   Problem: RH Swallowing Goal: LTG Patient will consume least restrictive diet using compensatory strategies with assistance (SLP) Description: LTG:  Patient will consume least restrictive diet using compensatory strategies with assistance (SLP) Outcome: Completed/Met Flowsheets (Taken 06/29/2021 1610) LTG: Pt Patient will consume least restrictive diet using compensatory strategies with assistance of (SLP): Minimal Assistance - Patient > 75% Note: Dys 1/thin Goal: LTG Pt will demonstrate functional change in swallow as evidenced by bedside/clinical objective assessment (SLP) Description: LTG: Patient will demonstrate functional change in swallow as evidenced by bedside/clinical objective assessment (SLP) Outcome: Completed/Met Flowsheets (Taken 06/29/2021 1610) LTG: Patient will demonstrate functional change in swallow as evidenced by bedside/clinical objective assessment: Oral swallow Note: Dys 1/thin   Problem: RH Comprehension Communication Goal: LTG Patient will comprehend basic/complex auditory (SLP) Description: LTG: Patient will comprehend basic/complex auditory information with cues (SLP). Outcome: Completed/Met Flowsheets (Taken 06/29/2021 1610) LTG: Patient will comprehend: Basic auditory information LTG: Patient will comprehend auditory information with cueing (SLP): Moderate Assistance - Patient 50 - 74%   Problem: RH Attention Goal: LTG Patient will demonstrate this level of attention during  functional activites (SLP) Description: LTG:  Patient will will demonstrate this level of attention during functional activites (SLP) Outcome: Completed/Met Flowsheets (Taken 06/29/2021 1610) Patient will demonstrate during cognitive/linguistic activities the attention type of: Focused LTG: Patient will demonstrate this level of attention during cognitive/linguistic activities with assistance of (SLP): Moderate Assistance - Patient 50 - 74%

## 2021-06-29 NOTE — Progress Notes (Signed)
Physical Therapy Session Note  Patient Details  Name: Mckenzie Guerrero MRN: [id number] Date of Birth: October 22, 1950  Today's Date: 06/29/2021 PT Individual Time: 0800-0903 PT Individual Time Calculation (min): 63 min   Short Term Goals: Week 2:  PT Short Term Goal 1 (Week 2): STG = LTG due to ELOS  Skilled Therapeutic Interventions/Progress Updates:     Pt greeted supine in bed at start of session - appears agreeable to PT tx and does not appear to be in any pain but aphasia is limiting communication. Reviewed DC planning and home safety with pt. Bed mobility completed mod I. Sit<>stand with supervision and no AD and pt ambulates to bathroom with CGA and no AD. Pt continent of bladder, charted in flowsheets - ambulated sinkside with supervision and no AD for hand hygiene - able to brush/comb her hair in standing with supervision but lacked awareness at times - throwing comb in trashcan when completed rather than placing on sink. Pt self selecting w/c for transport to rehab gym. Pt then completed stair training - up/down x8 3inch stairs and x4 6inch stairs - completed x2 bouts total without seated rest break. Ambulated 114ft with supervision and no AD to ortho rehab gym. Worked on dynamic standing balance with CGA while completing horse shoe toss, alternating toe tap to 6inch block (minA), and supervision while completing ball toss with PT. She appeared to get dizzy while tossing the ball, required seated rest break to recover - no nystagmus observed but she would close her eyes in some discomfort. Pt then ambulated with supervision and no AD, 12ft, back to her w/c in main rehab gym. Transported to day room for energy conservation and assisted onto nustep with supervision transfer. Pt completed 6 minutes at workload 3, using  BUE/BLE, with emphasis on maintaining >40 steps/minute cadence and bilateral hip/knee extension to achieve full stepping cycle. At this point of session, pt demonstrating fatigue and  appeared to want to return to her room. Assisted back to bed with supervision transfer and bed mobility completed mod I. Bed alarm on and all needs in reach at conclusion of session. She missed 12 minutes due to fatigue.   Therapy Documentation Precautions:  Precautions Precautions: Fall Precaution Comments: Pt with recent R hernia repair R inattention, aphasic, impulsive Restrictions Weight Bearing Restrictions: No General: PT Amount of Missed Time (min): 12 Minutes PT Missed Treatment Reason: Patient unwilling to participate;Patient fatigue  Therapy/Group: Individual Therapy  Alger Simons 06/29/2021, 7:37 AM

## 2021-06-29 NOTE — Progress Notes (Signed)
[place] PHYSICAL MEDICINE & REHABILITATION PROGRESS NOTE  Subjective/Complaints:  Pt and PT says doing "OK"- per pt and no issues per PT.  Pt d/c tomorrow- still on D1 thin liquids.   ROS: limited due to aphasia/cognition  Objective: Vital Signs: Blood pressure 110/65, pulse 60, temperature 98.1 F (36.7 C), temperature source Oral, resp. rate 18, height 5\' 2"  (1.575 m), weight 57.3 kg, SpO2 97 %. No results found. Recent Labs    06/29/21 0545  WBC 14.1*  HGB 12.6  HCT 40.2  PLT 428*    Recent Labs    06/29/21 0545  NA 138  K 4.2  CL 105  CO2 25  GLUCOSE 99  BUN 22  CREATININE 0.82  CALCIUM 9.5      Intake/Output Summary (Last 24 hours) at 06/29/2021 0921 Last data filed at 06/29/2021 0749 Gross per 24 hour  Intake 238 ml  Output --  Net 238 ml         Physical Exam: BP 110/65    Pulse 60    Temp 98.1 F (36.7 C) (Oral)    Resp 18    Ht 5\' 2"  (1.575 m)    Wt 57.3 kg    SpO2 97%    BMI 23.10 kg/m    General: awake, alert, kept repeating "OK"- but no signs of agitation- is calm; PT at bedside; NAD HENT: conjugate gaze; oropharynx moist CV: regular rate; no JVD Pulmonary: CTA B/L; no W/R/R- good air movement GI: soft, NT, ND, (+)BS Psychiatric: aphasic; calm Neurological: aphasic  Skin: Warm and dry.  Intact. Musc: No edema in extremities.  No tenderness in extremities. Tenderness to palpation of lower back Neuro: Alert Global aphasia Nods yes to all questions, tries to say her son's name.  Unable to point or follow motor commands without tactile cues. Can follow 1-step commands Right facial weakness.  Motor: Spontaneously moves all extremities, >/3/5 throughout, ambulating CG with RW   Assessment/Plan: 1. Functional deficits which require 3+ hours per day of interdisciplinary therapy in a comprehensive inpatient rehab setting. Physiatrist is providing close team supervision and 24 hour management of active medical problems listed  below. Physiatrist and rehab team continue to assess barriers to discharge/monitor patient progress toward functional and medical goals   Care Tool:  Bathing    Body parts bathed by patient: Right arm, Left arm, Chest, Abdomen, Face, Right upper leg, Left upper leg, Front perineal area, Buttocks, Right lower leg, Left lower leg   Body parts bathed by helper: Front perineal area, Buttocks, Right lower leg, Left lower leg     Bathing assist Assist Level: Contact Guard/Touching assist     Upper Body Dressing/Undressing Upper body dressing   What is the patient wearing?: Pull over shirt    Upper body assist Assist Level: Supervision/Verbal cueing    Lower Body Dressing/Undressing Lower body dressing      What is the patient wearing?: Pants, Incontinence brief     Lower body assist Assist for lower body dressing: Minimal Assistance - Patient > 75%     Toileting Toileting    Toileting assist Assist for toileting: Minimal Assistance - Patient > 75%     Transfers Chair/bed transfer  Transfers assist     Chair/bed transfer assist level: Supervision/Verbal cueing     Locomotion Ambulation   Ambulation assist   Ambulation activity did not occur: Safety/medical concerns  Assist level: Supervision/Verbal cueing Assistive device: No Device Max distance: 200'   Walk 10 feet activity  Assist     Assist level: Supervision/Verbal cueing Assistive device: No Device   Walk 50 feet activity   Assist Walk 50 feet with 2 turns activity did not occur: Safety/medical concerns  Assist level: Supervision/Verbal cueing Assistive device: No Device    Walk 150 feet activity   Assist Walk 150 feet activity did not occur: Safety/medical concerns  Assist level: Supervision/Verbal cueing Assistive device: No Device    Walk 10 feet on uneven surface  activity   Assist Walk 10 feet on uneven surfaces activity did not occur: Safety/medical concerns   Assist  level: Contact Guard/Touching assist     Wheelchair     Assist Is the patient using a wheelchair?: No   Wheelchair activity did not occur: Safety/medical concerns  Wheelchair assist level: Dependent - Patient 0% (patient unable to follow-mulimodal cues for w/c propulsion) Max wheelchair distance: >150 ft    Wheelchair 50 feet with 2 turns activity    Assist        Assist Level: Dependent - Patient 0%   Wheelchair 150 feet activity     Assist      Assist Level: Dependent - Patient 0%    Medical Problem List and Plan: 1. 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  ELOS- 06/30/21  Continue CIR- PT, OT and SLP- d/c tomorrow 2.  Impaired mobility: ambulating >300 feet: Continue eliquis 5mg  BID             -antiplatelet therapy:  ASA daily 3. Low back pain: kpad added. Add lidocaine patch. Continue scheduled Tylenol 650mg  TID. LFTs reviewed and normal 4. Agitation: LCSW to follow for evaluation and support. Continue PRN Xanax 5. Neuropsych: This patient is not capable of making decisions on her own behalf. 6. Skin/Wound Care: Routine pressure relief measures.  7. Fluids/Electrolytes/Nutrition: Monitor I/Os. 8. A fib: Will monitor BP TID--toprolol 12.5mg  resumed after discussion with pharmacy, continue, well controlled           Vitals:   06/29/21 0452 06/29/21 0750  BP: 109/70 110/65  Pulse: 64 60  Resp: 18   Temp: 98.1 F (36.7 C)   SpO2: 97%   Rate controlled mild brady- cont current dose   12/19- Slightly low- but not bradycardic right now- con't regimen  9. H/o Migraines: Was on sumatriptan PTA.               Monitor for increase in migraines, especially in association with vascular headaches 10. Leucocytosis: Monitor for fevers and other signs of infection.              WBC 16.1 on 12/4, continue to monitor, repeat tomorrow.  Afebrile  No signs/symptoms of  infection  11. Urinary retention? Incontinence?: continuing voiding trial.  12/10- is voiding- rare incontinence 12.  Post stroke dysphagia  D1 thins, advance diet as tolerated 12/19- going home on D1 thin liquids-  13.  Dark-colored stools  Hemoccult negative on third read, CBC stable, likely secondary to constipation, continue mild of mag as below.  14. Decreased appetite: Continue Megace 15. Constipation: resolved with GoLytely, continue milk of mag 57mL daily 16. Disposition: HFU scheduled  LOS: 16 days A FACE TO FACE EVALUATION WAS PERFORMED  Danyetta Gillham 06/29/2021, 9:21 AM

## 2021-06-29 NOTE — Progress Notes (Signed)
Speech Language Pathology Discharge Summary  Patient Details  Name: Mckenzie Guerrero MRN: [id number] Date of Birth: July 13, 1950  Today's Date: 06/29/2021 SLP Individual Time: 0932-1030 SLP Individual Time Calculation (min): 58 min   Skilled Therapeutic Interventions:  Pt seen for skilled ST with focus on speech and swallow goals, pt in bed and coming to sit EOB for tasks. 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 Pt verbalizing "yea" "ok" and "no" but unable to state any biographical information, name basic items or repeat words. Pt remains with inconsistent ability to follow 1-step basic commands. Pt consuming 1 bite of Dys 3 snack before deferring any more intake, consuming with prolonged oral stage and no overt s/s aspiration. Pt tolerating thin liquids via straw with no difficulty. At this time, pt pointing to bathroom indicating need to void. SLP providing mod A verbal and visual cues for safety and problem solving during transfers and bathroom management, pt often attempting to stand impulsively with difficulties sequencing functional tasks. Nursing made aware of bowel movement. Pt returned to bed, left with alarm set and all needs within reach.   Patient has met 4 of 5 long term goals.  Patient to discharge at overall Mod level.  Reasons goals not met: severe expressive aphasia   Clinical Impression/Discharge Summary: Pt has made slow gains during CIR but has met 4 out of 5 long term goals. LTG patient was unable to meet was for expressive communication due to patient's ongoing severe expressive aphasia. [place] is on caregiver for communication of wants and needs at this time, pt needs max A multimodal cues to increase use of verbalized yes/no, gestures and use of communication board. Pt is discharging at overall mod A for receptive communication and attention during  basic/functional tasks and benefits from simple, short, and clear instructions for daily living tasks. Visual cues have proven helpful to increase comprehension. Pt has been tolerating a Dys 1/thin diet during stay with multiple attempts with trials of upgraded textures (Dys 2/3) however pt is not tolerating advanced textures at this time due to difficulty with mastication and oral stage fatigue. It is recommended pt discharge on current diet of Dys 1/thin with min A-Supervision for use of swallow precautions. Pt and family have previously been educated on diet recommendations, communication strategies and remaining cognitive impairments at discharge. Handout for Dys 1 diet recommendations provided today. Recommend 24/7 supervision and [place] to maximize functional communication, maximize basic cognitive function for daily living tasks, upgrade diet recommendations as appropriate and reduce caregiver burden.   Care Partner:  Caregiver Able to Provide Assistance: Yes  Type of Caregiver Assistance: Physical;Cognitive  Recommendation:  24 hour supervision/assistance;Home Health SLP  Rationale for SLP Follow Up: Maximize functional communication;Maximize cognitive function and independence;Maximize swallowing safety;Reduce caregiver burden   Reasons for discharge: Discharged from hospital   Patient/Family Agrees with Progress Made and Goals Achieved: Yes    Dewaine Conger 06/29/2021, 10:09 AM

## 2021-06-29 NOTE — Progress Notes (Signed)
Inpatient Rehabilitation Discharge Medication Review by a Pharmacist  A complete drug regimen review was completed for this patient to identify any potential clinically significant medication issues.  High Risk Drug Classes Is patient taking? Indication by Medication  Antipsychotic Yes PRN compazine for n/v for inpt only  Anticoagulant Yes Apixaban for AF  Antibiotic No   Opioid No   Antiplatelet No   Hypoglycemics/insulin No   Vasoactive Medication Yes Toprol for AF  Chemotherapy No   Other Yes Atorvastatin - CVA/HLD Xanax for anxiety     Type of Medication Issue Identified Description of Issue Recommendation(s)  Drug Interaction(s) (clinically significant)     Duplicate Therapy     Allergy     No Medication Administration End Date     Incorrect Dose     Additional Drug Therapy Needed  Vitamin D, Toprol, claritin, multivitamin, probiotic, imitrex Discuss with Dr. Posey Pronto and we will only going to resume Toprol only.   Significant med changes from prior encounter (inform family/care partners about these prior to discharge).    Other       Clinically significant medication issues were identified that warrant physician communication and completion of prescribed/recommended actions by midnight of the next day:  No  Name of provider notified for urgent issues identified: Dr Posey Pronto  Provider Method of Notification: Phone    Pharmacist comments: Resume only Toprol since pt was on in prior to transfer. Hold others listed on DC summary especially Imitrex due to her hemorrhagic CVA.   Time spent performing this drug regimen review (minutes):  [place], PharmD, [place], AAHIVP, CPP Infectious Disease Pharmacist 06/29/2021 9:40 AM

## 2021-06-30 ENCOUNTER — Other Ambulatory Visit (HOSPITAL_COMMUNITY): Payer: Self-pay

## 2021-06-30 DIAGNOSIS — K5900 Constipation, unspecified: Secondary | ICD-10-CM

## 2021-06-30 DIAGNOSIS — R4701 Aphasia: Secondary | ICD-10-CM

## 2021-06-30 NOTE — Progress Notes (Signed)
Olin Hauser PA discussed discharge instructions. Family verbalizes understanding. Belongings gathered. Pt left per wheelchair to private vehicle. No complications noted. Sheela Stack, LPN

## 2021-06-30 NOTE — Progress Notes (Incomplete)
[place] PHYSICAL MEDICINE & REHABILITATION PROGRESS NOTE  Subjective/Complaints:    ROS: limited due to aphasia/cognition  Objective: Vital Signs: Blood pressure 111/60, pulse 81, temperature 98.5 F (36.9 C), resp. rate 16, height 5\' 2"  (1.575 m), weight 57.3 kg, SpO2 97 %. No results found. Recent Labs    06/29/21 0545  WBC 14.1*  HGB 12.6  HCT 40.2  PLT 428*     Recent Labs    06/29/21 0545  NA 138  K 4.2  CL 105  CO2 25  GLUCOSE 99  BUN 22  CREATININE 0.82  CALCIUM 9.5       Intake/Output Summary (Last 24 hours) at 06/30/2021 0929 Last data filed at 06/30/2021 0844 Gross per 24 hour  Intake 480 ml  Output --  Net 480 ml          Physical Exam: BP 111/60    Pulse 81    Temp 98.5 F (36.9 C)    Resp 16    Ht 5\' 2"  (1.575 m)    Wt 57.3 kg    SpO2 97%    BMI 23.10 kg/m     Skin: Warm and dry.  Intact. Musc: No edema in extremities.  No tenderness in extremities. Tenderness to palpation of lower back Neuro: Alert Global aphasia Nods yes to all questions, tries to say her son's name.  Unable to point or follow motor commands without tactile cues. Can follow 1-step commands Right facial weakness.  Motor: Spontaneously moves all extremities, >/3/5 throughout, ambulating CG with RW   Assessment/Plan: 1. Functional deficits which require 3+ hours per day of interdisciplinary therapy in a comprehensive inpatient rehab setting. Physiatrist is providing close team supervision and 24 hour management of active medical problems listed below. Physiatrist and rehab team continue to assess barriers to discharge/monitor patient progress toward functional and medical goals   Care Tool:  Bathing    Body parts bathed by patient: Right arm, Left arm, Chest, Abdomen, Face, Right upper leg, Left upper leg, Front perineal area, Buttocks, Right lower leg, Left lower leg   Body parts bathed by helper: Front perineal area, Buttocks, Right lower leg, Left lower  leg     Bathing assist Assist Level: Supervision/Verbal cueing     Upper Body Dressing/Undressing Upper body dressing   What is the patient wearing?: Pull over shirt    Upper body assist Assist Level: Supervision/Verbal cueing    Lower Body Dressing/Undressing Lower body dressing      What is the patient wearing?: Pants, Incontinence brief     Lower body assist Assist for lower body dressing: Supervision/Verbal cueing     Toileting Toileting    Toileting assist Assist for toileting: Supervision/Verbal cueing     Transfers Chair/bed transfer  Transfers assist     Chair/bed transfer assist level: Supervision/Verbal cueing     Locomotion Ambulation   Ambulation assist   Ambulation activity did not occur: Safety/medical concerns  Assist level: Supervision/Verbal cueing Assistive device: No Device Max distance: 200'   Walk 10 feet activity   Assist     Assist level: Supervision/Verbal cueing Assistive device: No Device   Walk 50 feet activity   Assist Walk 50 feet with 2 turns activity did not occur: Safety/medical concerns  Assist level: Supervision/Verbal cueing Assistive device: No Device    Walk 150 feet activity   Assist Walk 150 feet activity did not occur: Safety/medical concerns  Assist level: Supervision/Verbal cueing Assistive device: No Device    Walk  10 feet on uneven surface  activity   Assist Walk 10 feet on uneven surfaces activity did not occur: Safety/medical concerns   Assist level: Contact Guard/Touching assist     Wheelchair     Assist Is the patient using a wheelchair?: No   Wheelchair activity did not occur: Safety/medical concerns  Wheelchair assist level: Dependent - Patient 0% (patient unable to follow-mulimodal cues for w/c propulsion) Max wheelchair distance: >150 ft    Wheelchair 50 feet with 2 turns activity    Assist        Assist Level: Dependent - Patient 0%   Wheelchair 150 feet  activity     Assist      Assist Level: Dependent - Patient 0%    Medical Problem List and Plan: 1. 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  ELOS- 06/30/21  Continue CIR- PT, OT and SLP- d/c tomorrow 2.  Impaired mobility: ambulating >300 feet: Continue eliquis 5mg  BID             -antiplatelet therapy:  ASA daily 3. Low back pain: kpad added. Add lidocaine patch. Continue scheduled Tylenol 650mg  TID. LFTs reviewed and normal 4. Agitation: LCSW to follow for evaluation and support. Continue PRN Xanax 5. Neuropsych: This patient is not capable of making decisions on her own behalf. 6. Skin/Wound Care: Routine pressure relief measures.  7. Fluids/Electrolytes/Nutrition: Monitor I/Os. 8. A fib: Will monitor BP TID--toprolol 12.5mg  resumed after discussion with pharmacy, continue, well controlled           Vitals:   06/30/21 0529 06/30/21 0838  BP: 109/67 111/60  Pulse: 69 81  Resp: 16   Temp: 98.5 F (36.9 C)   SpO2: 97%   Rate controlled mild brady- cont current dose   12/19- Slightly low- but not bradycardic right now- con't regimen  9. H/o Migraines: Was on sumatriptan PTA.               Monitor for increase in migraines, especially in association with vascular headaches 10. Leucocytosis: Monitor for fevers and other signs of infection.              WBC 16.1 on 12/4, continue to monitor, repeat tomorrow.  Afebrile  No signs/symptoms of infection  11. Urinary retention? Incontinence?: continuing voiding trial.  12/10- is voiding- rare incontinence 12.  Post stroke dysphagia  D1 thins, advance diet as tolerated 12/19- going home on D1 thin liquids-  13.  Dark-colored stools  Hemoccult negative on third read, CBC stable, likely secondary to constipation, continue mild of mag as below.  14. Decreased appetite: Continue Megace 15. Constipation: resolved with GoLytely, continue milk  of mag 39mL daily 16. Disposition: HFU scheduled  LOS: 17 days A FACE TO FACE EVALUATION WAS PERFORMED  Charlett Blake 06/30/2021, 9:29 AM

## 2021-06-30 NOTE — Progress Notes (Signed)
Patient ID: Mckenzie Guerrero, female   DOB: Jan 05, 1951, 70 y.o.   MRN: [id number]  Son wants rolling walker and 3 in 1 sent back due to says has them at home. Did not mention this on Friday when here for education. Have contacted [employer] to pick up

## 2021-06-30 NOTE — Discharge Summary (Signed)
Physician Discharge Summary  Patient ID: Mckenzie Guerrero MRN: [id number] DOB/AGE: August 03, 1950 70 y.o.  Admit date: 06/13/2021 Discharge date: 06/30/2021  Discharge Diagnoses:  Principal Problem:   Acute ischemic left middle cerebral artery (MCA) stroke (HCC) Active Problems:   Cervical spondylosis with myelopathy and radiculopathy   HTN (hypertension)   Leukocytosis   Thrombocytosis   AF (paroxysmal atrial fibrillation) (HCC)   Right hemiparesis (HCC)   Dysphagia, post-stroke   Constipation   Combined receptive and expressive aphasia   Discharged Condition: stable  Significant Diagnostic Studies: DG Abd 1 View  Result Date: 06/17/2021 CLINICAL DATA:  Constipation. EXAM: ABDOMEN - 1 VIEW COMPARISON:  None. FINDINGS: There is mild-to-moderate amount of stool mixed with contrast throughout the colon. No bowel dilatation or evidence of obstruction. Colonic diverticula noted. No free air. Degenerative changes of the spine. No acute osseous pathology. IMPRESSION: 1. No bowel obstruction. 2. Colonic diverticulosis. Electronically Signed   By: Anner Crete M.D.   On: 06/17/2021 19:17    Labs:  Basic Metabolic Panel: BMP Latest Ref Rng & Units 06/29/2021 06/22/2021 06/17/2021  Glucose 70 - 99 mg/dL 99 97 103(H)  BUN 8 - 23 mg/dL 22 16 18   Creatinine 0.44 - 1.00 mg/dL 0.82 0.73 0.69  Sodium 135 - 145 mmol/L 138 140 137  Potassium 3.5 - 5.1 mmol/L 4.2 4.1 3.5  Chloride 98 - 111 mmol/L 105 108 107  CO2 22 - 32 mmol/L 25 22 21(L)  Calcium 8.9 - 10.3 mg/dL 9.5 9.2 9.1     CBC: CBC Latest Ref Rng & Units 06/29/2021 06/19/2021 06/18/2021  WBC 4.0 - 10.5 K/uL 14.1(H) 14.4(H) 15.5(H)  Hemoglobin 12.0 - 15.0 g/dL 12.6 12.0 12.2  Hematocrit 36.0 - 46.0 % 40.2 35.4(L) 35.7(L)  Platelets 150 - 400 K/uL 428(H) 472(H) 460(H)     CBG: No results for input(s): GLUCAP in the last 168 hours.  Brief HPI:   Mckenzie Guerrero is a 70 y.o. female with history of HTN, migraines, anxiety, PVD,  tremors, large hiatal hernia s/p repair 05/25/2021 who was admitted 06/08/2021 after found in recliner with right hemiparesis, right facial weakness, difficulty speaking and left gaze preference.  CTA/perfusion of brain was negative for LVO and showed occlusion left VA from base of skull and 42 mL core infarct with 14 mL penumbra.  She was out of window for tPA and not a thrombectomy candidate due to completed stroke.  MRI brain showed evolving left MCA infarct with hemorrhagic transformation and trace rightward midline shift.  A. fib was captured by telemetry and she was started on low-dose beta-blocker.  Dr. Erlinda Hong felt the stroke was due to newly diagnosed A. fib and recommended low-dose aspirin as repeat CT head 12/02 was stable as well as recommendations to transition to anticoagulation in 10 -15 days if neurologically stable.  She continue to be limited by right hemiparesis with left gaze preference, right inattention, balance deficits as well as cognitive deficits and was able to follow simple commands intermittently.  CIR was recommended due to functional decline.   Hospital Course: JOZEY JANCO was admitted to rehab 06/13/2021 for inpatient therapies to consist of PT, ST and OT at least three hours five days a week. Past admission physiatrist, therapy team and rehab RN have worked together to provide customized collaborative inpatient rehab.  Her heart rate and blood pressures were monitored on TID basis and has been controlled.  She was maintained on low-dose aspirin and has been neurologically stable.  She  was transitioned to Eliquis on 12/08 and is tolerating this without side effects.    She has had significant issues with constipation which is cognitively based and required aggressive bowel program as well as disimpaction due to significant constipation.  KUB done showed mild to moderate stool without obstruction.  Stool guaiacs x3 were ordered and 2 out of 3 were positive.  H&H to be stable and heme  positive stools felt to be due to constipation.Family has been advised on monitoring bowel habits and adjusting laxatives as needed  Her p.o. intake was noted to be poor on dysphagia 1 diet and Megace was added to help stimulate appetite.Lidocaine patches were added to help with low back pain and seem to be effective.   Serial CBC shows persistent leukocytosis without fevers or signs of infection and white count is slowly trending down.    Palliative care was consulted to help discuss goals of care.  Family has elected on full scope of care and has declined outpatient palliative support.  She requires supervision with mobility and ADLs but continues to be limited by severe expressive aphasia and moderate receptive deficits.  She has been unable to tolerate attempts of diet upgrade due to difficulty with mastication and oral stage fatigue.  She will continue to receive outpatient PT, OT and ST at [facility] outpatient rehab after discharge.   Rehab course: During patient's stay in rehab weekly team conferences were held to monitor patient's progress, set goals and discuss barriers to discharge. At admission, patient required assist for mobility and assist with basic ADL tasks.  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 She  has had improvement in activity tolerance, balance, postural control as well as ability to compensate for deficits.    She is able to complete ADL tasks with supervision.  She requires supervision with verbal cues for transfers and contact-guard assist to ambulate 200 feet with a rolling walker. She requires mod assist for receptive communication and max multimodal cues with gestures and use of communication board to express wants and needs.  She is tolerating dysphagia 1 diet with  min assist to supervision for utilization of safe swallow strategies.  Family education was completed regarding cognitive impairments, diet as well as need for 24 hours supervision.  Discharge disposition: 01-Home or Self Care  Diet: Dysphagia 1, thin liquids.  Special Instructions: Toilet patient every 4 hours while awake. Recommend repeat CBC in 1 to 2 weeks to monitor white count and H&H.  Discharge Instructions     Ambulatory referral to Neurology   Complete by: As directed    An appointment is requested in approximately: 2-3 weeks   Ambulatory referral to Physical Medicine Rehab   Complete by: As directed    Hospital follow up      Allergies as of 06/30/2021       Reactions   Aspirin    Codeine Hives, Itching   Doxycycline Nausea And Vomiting   Nsaids Other (See Comments)   Gi bleeding   Sulfonamide Derivatives Swelling   Prednisone Anxiety        Medication List     STOP taking these medications    ASCORBIC ACID PO   aspirin 81 MG chewable tablet   cholecalciferol 25 MCG (1000 UNIT) tablet Commonly known as: VITAMIN D3   diclofenac Sodium 1 % Gel  Commonly known as: VOLTAREN   HYDROcodone-acetaminophen 5-325 MG tablet Commonly known as: NORCO/VICODIN   loratadine 10 MG tablet Commonly known as: CLARITIN   multivitamin with minerals Tabs tablet   ondansetron 4 MG disintegrating tablet Commonly known as: Zofran ODT   Probiotic Caps   Sinus Pressure + Pain 5-325 MG Tabs Generic drug: Phenylephrine-Acetaminophen   sodium chloride 0.65 % Soln nasal spray Commonly known as: OCEAN   SUMAtriptan 50 MG tablet Commonly known as: IMITREX   SYSTANE OP       TAKE these medications    acetaminophen 325 MG tablet Commonly known as: TYLENOL Take 2 tablets (650 mg total) by mouth 3 (three) times daily. What changed:  medication strength how much to take when to take this reasons to take this additional instructions   atorvastatin 40 MG  tablet Commonly known as: LIPITOR Take 1 tablet (40 mg total) by mouth daily.   B-complex with vitamin C tablet Take 1 tablet by mouth daily.   Eliquis 5 MG Tabs tablet Generic drug: apixaban Take 1 tablet (5 mg total) by mouth 2 (two) times daily.   Gerhardt's butt cream Crea Apply 1 application topically 3 (three) times daily. Notes to patient: Use desitin or Zinc oxide   lidocaine 5 % Commonly known as: LIDODERM Place 1 patch onto the skin daily. To lower back--on at 8 am and remove at 8 pm daily. Has to be off for 12 hours   megestrol 40 MG/ML suspension Commonly known as: MEGACE Take 10 mLs (400 mg total) by mouth daily.   metoprolol succinate 25 MG 24 hr tablet Commonly known as: TOPROL-XL Take 1/2 tablet (12.5 mg total) by mouth daily.        Follow-up Information     Raulkar, Clide Deutscher, MD Follow up.   Specialty: Physical Medicine and Rehabilitation Why: 08/06/21 please arrive at 11:20pm for 11:40pm follow-up, thank you! Contact information: [address] [place] [address] [phone]         Raina Mina., MD. Call today.   Specialty: Internal Medicine Why: for post hospital follow up Contact information: [address] [place]  [address] [phone]         [facility]. Call.   Why: for post stroke follow up Contact information: [address]     [place] [address] [phone]                Signed: Bary Leriche 07/01/2021, 6:07 PM

## 2021-07-13 ENCOUNTER — Telehealth (HOSPITAL_COMMUNITY): Payer: Self-pay

## 2021-07-13 ENCOUNTER — Other Ambulatory Visit (HOSPITAL_COMMUNITY): Payer: Self-pay

## 2021-07-13 NOTE — Telephone Encounter (Signed)
Attempted to contact Idelia Salm for a Pharmacy Transition of Care call. LVM.

## 2021-07-17 ENCOUNTER — Inpatient Hospital Stay (HOSPITAL_COMMUNITY): Payer: PPO

## 2021-07-17 ENCOUNTER — Emergency Department (HOSPITAL_COMMUNITY): Payer: PPO

## 2021-07-17 ENCOUNTER — Inpatient Hospital Stay (HOSPITAL_COMMUNITY)
Admission: EM | Admit: 2021-07-17 | Discharge: 2021-07-28 | DRG: 492 | Disposition: A | Discharge status: Skilled Nursing Facility | Payer: PPO | Attending: Internal Medicine | Admitting: Internal Medicine

## 2021-07-17 ENCOUNTER — Other Ambulatory Visit: Payer: Self-pay

## 2021-07-17 DIAGNOSIS — Z881 Allergy status to other antibiotic agents status: Secondary | ICD-10-CM | POA: Diagnosis not present

## 2021-07-17 DIAGNOSIS — I69320 Aphasia following cerebral infarction: Secondary | ICD-10-CM

## 2021-07-17 DIAGNOSIS — E7800 Pure hypercholesterolemia, unspecified: Secondary | ICD-10-CM | POA: Diagnosis not present

## 2021-07-17 DIAGNOSIS — K219 Gastro-esophageal reflux disease without esophagitis: Secondary | ICD-10-CM | POA: Diagnosis present

## 2021-07-17 DIAGNOSIS — S42352A Displaced comminuted fracture of shaft of humerus, left arm, initial encounter for closed fracture: Secondary | ICD-10-CM | POA: Diagnosis not present

## 2021-07-17 DIAGNOSIS — Z885 Allergy status to narcotic agent status: Secondary | ICD-10-CM | POA: Diagnosis not present

## 2021-07-17 DIAGNOSIS — N390 Urinary tract infection, site not specified: Secondary | ICD-10-CM | POA: Diagnosis not present

## 2021-07-17 DIAGNOSIS — I480 Paroxysmal atrial fibrillation: Secondary | ICD-10-CM | POA: Diagnosis not present

## 2021-07-17 DIAGNOSIS — G43909 Migraine, unspecified, not intractable, without status migrainosus: Secondary | ICD-10-CM | POA: Diagnosis present

## 2021-07-17 DIAGNOSIS — Z981 Arthrodesis status: Secondary | ICD-10-CM | POA: Diagnosis not present

## 2021-07-17 DIAGNOSIS — U071 COVID-19: Secondary | ICD-10-CM | POA: Diagnosis present

## 2021-07-17 DIAGNOSIS — I63412 Cerebral infarction due to embolism of left middle cerebral artery: Secondary | ICD-10-CM | POA: Diagnosis not present

## 2021-07-17 DIAGNOSIS — D62 Acute posthemorrhagic anemia: Secondary | ICD-10-CM | POA: Diagnosis not present

## 2021-07-17 DIAGNOSIS — Y92009 Unspecified place in unspecified non-institutional (private) residence as the place of occurrence of the external cause: Secondary | ICD-10-CM | POA: Diagnosis not present

## 2021-07-17 DIAGNOSIS — S42309A Unspecified fracture of shaft of humerus, unspecified arm, initial encounter for closed fracture: Secondary | ICD-10-CM | POA: Diagnosis present

## 2021-07-17 DIAGNOSIS — Z7901 Long term (current) use of anticoagulants: Secondary | ICD-10-CM

## 2021-07-17 DIAGNOSIS — Z79899 Other long term (current) drug therapy: Secondary | ICD-10-CM | POA: Diagnosis not present

## 2021-07-17 DIAGNOSIS — Z886 Allergy status to analgesic agent status: Secondary | ICD-10-CM

## 2021-07-17 DIAGNOSIS — W1830XA Fall on same level, unspecified, initial encounter: Secondary | ICD-10-CM | POA: Diagnosis present

## 2021-07-17 DIAGNOSIS — S4422XA Injury of radial nerve at upper arm level, left arm, initial encounter: Secondary | ICD-10-CM | POA: Diagnosis not present

## 2021-07-17 DIAGNOSIS — Z888 Allergy status to other drugs, medicaments and biological substances status: Secondary | ICD-10-CM

## 2021-07-17 DIAGNOSIS — S42302A Unspecified fracture of shaft of humerus, left arm, initial encounter for closed fracture: Secondary | ICD-10-CM | POA: Diagnosis not present

## 2021-07-17 DIAGNOSIS — M1990 Unspecified osteoarthritis, unspecified site: Secondary | ICD-10-CM | POA: Diagnosis present

## 2021-07-17 DIAGNOSIS — Z87891 Personal history of nicotine dependence: Secondary | ICD-10-CM

## 2021-07-17 DIAGNOSIS — L299 Pruritus, unspecified: Secondary | ICD-10-CM | POA: Diagnosis not present

## 2021-07-17 DIAGNOSIS — Z8673 Personal history of transient ischemic attack (TIA), and cerebral infarction without residual deficits: Secondary | ICD-10-CM | POA: Diagnosis not present

## 2021-07-17 DIAGNOSIS — Z419 Encounter for procedure for purposes other than remedying health state, unspecified: Secondary | ICD-10-CM

## 2021-07-17 DIAGNOSIS — W19XXXA Unspecified fall, initial encounter: Secondary | ICD-10-CM

## 2021-07-17 DIAGNOSIS — S42292A Other displaced fracture of upper end of left humerus, initial encounter for closed fracture: Secondary | ICD-10-CM | POA: Diagnosis not present

## 2021-07-17 DIAGNOSIS — I10 Essential (primary) hypertension: Secondary | ICD-10-CM | POA: Diagnosis present

## 2021-07-17 DIAGNOSIS — E785 Hyperlipidemia, unspecified: Secondary | ICD-10-CM | POA: Diagnosis not present

## 2021-07-17 DIAGNOSIS — Z23 Encounter for immunization: Secondary | ICD-10-CM

## 2021-07-17 DIAGNOSIS — S42202A Unspecified fracture of upper end of left humerus, initial encounter for closed fracture: Secondary | ICD-10-CM | POA: Diagnosis not present

## 2021-07-17 DIAGNOSIS — Z882 Allergy status to sulfonamides status: Secondary | ICD-10-CM

## 2021-07-17 DIAGNOSIS — T148XXA Other injury of unspecified body region, initial encounter: Secondary | ICD-10-CM

## 2021-07-17 DIAGNOSIS — Z8249 Family history of ischemic heart disease and other diseases of the circulatory system: Secondary | ICD-10-CM | POA: Diagnosis not present

## 2021-07-17 DIAGNOSIS — M21332 Wrist drop, left wrist: Secondary | ICD-10-CM | POA: Diagnosis not present

## 2021-07-17 LAB — CBC WITH DIFFERENTIAL/PLATELET
Abs Immature Granulocytes: 0.04 10*3/uL (ref 0.00–0.07)
Basophils Absolute: 0 10*3/uL (ref 0.0–0.1)
Basophils Relative: 0 %
Eosinophils Absolute: 0 10*3/uL (ref 0.0–0.5)
Eosinophils Relative: 0 %
HCT: 38.3 % (ref 36.0–46.0)
Hemoglobin: 12.4 g/dL (ref 12.0–15.0)
Immature Granulocytes: 0 %
Lymphocytes Relative: 10 %
Lymphs Abs: 1.1 10*3/uL (ref 0.7–4.0)
MCH: 30.1 pg (ref 26.0–34.0)
MCHC: 32.4 g/dL (ref 30.0–36.0)
MCV: 93 fL (ref 80.0–100.0)
Monocytes Absolute: 1.1 10*3/uL — ABNORMAL HIGH (ref 0.1–1.0)
Monocytes Relative: 10 %
Neutro Abs: 9 10*3/uL — ABNORMAL HIGH (ref 1.7–7.7)
Neutrophils Relative %: 80 %
Platelets: 309 10*3/uL (ref 150–400)
RBC: 4.12 MIL/uL (ref 3.87–5.11)
RDW: 13.4 % (ref 11.5–15.5)
WBC: 11.4 10*3/uL — ABNORMAL HIGH (ref 4.0–10.5)
nRBC: 0 % (ref 0.0–0.2)

## 2021-07-17 LAB — BASIC METABOLIC PANEL
Anion gap: 9 (ref 5–15)
BUN: 12 mg/dL (ref 8–23)
CO2: 22 mmol/L (ref 22–32)
Calcium: 8.8 mg/dL — ABNORMAL LOW (ref 8.9–10.3)
Chloride: 103 mmol/L (ref 98–111)
Creatinine, Ser: 0.72 mg/dL (ref 0.44–1.00)
GFR, Estimated: >60 mL/min (ref >60)
Glucose, Bld: 118 mg/dL — ABNORMAL HIGH (ref 70–99)
Potassium: 3.7 mmol/L (ref 3.5–5.1)
Sodium: 134 mmol/L — ABNORMAL LOW (ref 135–145)

## 2021-07-17 LAB — SAMPLE TO BLOOD BANK

## 2021-07-17 LAB — RESP PANEL BY RT-PCR (FLU A&B, COVID) ARPGX2
Influenza A by PCR: NEGATIVE
Influenza B by PCR: NEGATIVE
SARS Coronavirus 2 by RT PCR: POSITIVE — AB

## 2021-07-17 MED ORDER — ACETAMINOPHEN 650 MG RE SUPP: 650.0000 mg | Freq: Four times a day (QID) | RECTAL | Status: DC | PRN

## 2021-07-17 MED ORDER — ACETAMINOPHEN 325 MG PO TABS: 650.0000 mg | ORAL_TABLET | Freq: Four times a day (QID) | ORAL | Status: DC | PRN

## 2021-07-17 MED ORDER — HYDROMORPHONE HCL 1 MG/ML IJ SOLN
0.5000 mg | INTRAMUSCULAR | Status: DC | PRN
  Administered 2021-07-17 – 2021-07-20 (×5): 0.5 mg via INTRAVENOUS
  Filled 2021-07-17 (×5): qty 0.5
  Filled 2021-07-17: qty 1

## 2021-07-17 MED ORDER — HYDROCODONE-ACETAMINOPHEN 5-325 MG PO TABS
1.0000 | ORAL_TABLET | ORAL | Status: DC
  Administered 2021-07-17: 1 via ORAL
  Administered 2021-07-17 – 2021-07-18 (×5): 2 via ORAL
  Administered 2021-07-19 (×2): 1 via ORAL
  Administered 2021-07-19: 2 via ORAL
  Administered 2021-07-19: 1 via ORAL
  Administered 2021-07-19: 2 via ORAL
  Administered 2021-07-19: 1 via ORAL
  Filled 2021-07-17 (×2): qty 1
  Filled 2021-07-17 (×4): qty 2
  Filled 2021-07-17: qty 1
  Filled 2021-07-17: qty 2
  Filled 2021-07-17 (×3): qty 1
  Filled 2021-07-17 (×2): qty 2

## 2021-07-17 MED ORDER — METOPROLOL SUCCINATE ER 25 MG PO TB24: 12.5000 mg | ORAL_TABLET | Freq: Every day | ORAL | Status: DC

## 2021-07-17 MED ORDER — FENTANYL CITRATE PF 50 MCG/ML IJ SOSY
75.0000 ug | PREFILLED_SYRINGE | Freq: Once | INTRAMUSCULAR | Status: AC
  Administered 2021-07-17: 75 ug via INTRAVENOUS
  Filled 2021-07-17: qty 2

## 2021-07-17 MED ORDER — HEPARIN (PORCINE) 25000 UT/250ML-% IV SOLN
850.0000 [IU]/h | INTRAVENOUS | Status: DC
  Administered 2021-07-17: 850 [IU]/h via INTRAVENOUS
  Filled 2021-07-17: qty 250

## 2021-07-17 MED ORDER — HEPARIN SODIUM (PORCINE) 5000 UNIT/ML IJ SOLN: 5000.0000 [IU] | Freq: Three times a day (TID) | INTRAMUSCULAR | Status: DC

## 2021-07-17 MED ORDER — ATORVASTATIN CALCIUM 40 MG PO TABS
40.0000 mg | ORAL_TABLET | Freq: Every day | ORAL | Status: DC
  Administered 2021-07-18 – 2021-07-28 (×10): 40 mg via ORAL
  Filled 2021-07-17 (×10): qty 1

## 2021-07-17 MED ORDER — ASPIRIN 81 MG PO CHEW
81.0000 mg | CHEWABLE_TABLET | Freq: Every day | ORAL | Status: DC
  Administered 2021-07-18: 81 mg via ORAL
  Filled 2021-07-17: qty 1

## 2021-07-17 MED ORDER — SENNOSIDES-DOCUSATE SODIUM 8.6-50 MG PO TABS
1.0000 | ORAL_TABLET | Freq: Every evening | ORAL | Status: DC | PRN
Start: 2021-07-17 — End: 2021-07-18

## 2021-07-17 MED ORDER — ASPIRIN 81 MG PO CHEW: 81.0000 mg | CHEWABLE_TABLET | Freq: Every day | ORAL | Status: DC

## 2021-07-17 NOTE — Progress Notes (Signed)
Orthopedic Tech Progress Note Patient Details:  Mckenzie Guerrero Jan 28, 1951 [id number]  Ortho Devices Type of Ortho Device: Coapt Ortho Device/Splint Location: LUE Ortho Device/Splint Interventions: Ordered, Application   Post Interventions Patient Tolerated: Well  Titan Karner A Analyah Mcconnon 07/17/2021, 2:55 PM

## 2021-07-17 NOTE — H&P (View-Only) (Signed)
Reason for Consult:Left humerus fx Referring Physician: Lavenia Atlas Time called: [id number] Time at bedside: Mckenzie Guerrero is an 71 y.o. female.  HPI: Mckenzie Guerrero fell in the bathroom this morning about 0600. It unclear what caused her to fall. She had a stroke about 3 weeks ago but has been doing well according to son. She is nonverbal and cannot contribute to history. She is RHD and does not use any assistive devices to ambulate.  Past Medical History:  Diagnosis Date   Anxiety    Arthritis    osteoarthritis   Complication of anesthesia    GERD (gastroesophageal reflux disease)    Headache    migraines   History of bleeding ulcers 2010   History of hiatal hernia    Hypertension    Neuromuscular disorder (HCC)    tremors   Occasional tremors    PONV (postoperative nausea and vomiting)     Past Surgical History:  Procedure Laterality Date   ANTERIOR CERVICAL DECOMP/DISCECTOMY FUSION N/A 09/28/2019   Procedure: Anterior Cervical Discectomy Fusion - Cervical Four-Cervical Five- Cervical Five-Cervical Six;  Surgeon: Earnie Larsson, MD;  Location: [facility];  Service: Neurosurgery;  Laterality: N/A;  Anterior Cervical Discectomy Fusion - Cervical Four-Cervical Five- Cervical Five-Cervical Six   benign tumor removal from left breast 2003  2003   BUNIONECTOMY Left    Great toe   COLONOSCOPY     ESOPHAGOGASTRODUODENOSCOPY N/A 05/25/2021   Procedure: ESOPHAGOGASTRODUODENOSCOPY (EGD);  Surgeon: Johnathan Hausen, MD;  Location: [facility];  Service: General;  Laterality: N/A;   ESOPHAGOGASTRODUODENOSCOPY  2022   w biopsy.  Dr Odie Sera in [place].  prior to [facility] surgery 05/2021.   XI ROBOTIC ASSISTED HIATAL HERNIA REPAIR N/A 05/25/2021   Procedure: XI ROBOTIC ASSISTED HIATAL HERNIA REPAIR WITH FUNDOPLICATION;  Surgeon: Johnathan Hausen, MD;  Location: [facility];  Service: General;  Laterality: N/A;    No family history on file.  Social History:  reports that she has quit smoking. She has never  used smokeless tobacco. She reports that she does not drink alcohol and does not use drugs.  Allergies:  Allergies  Allergen Reactions   Aspirin Other (See Comments)    unknown   Codeine Hives and Itching   Doxycycline Nausea And Vomiting   Nsaids Other (See Comments)    Gi bleeding   Sulfonamide Derivatives Swelling   Prednisone Anxiety    Medications: I have reviewed the patient's current medications.  Results for orders placed or performed during the hospital encounter of 07/17/21 (from the past 48 hour(s))  CBC with Differential     Status: Abnormal   Collection Time: 07/17/21 12:28 PM  Result Value Ref Range   WBC 11.4 (H) 4.0 - 10.5 K/uL   RBC 4.12 3.87 - 5.11 MIL/uL   Hemoglobin 12.4 12.0 - 15.0 g/dL   HCT 38.3 36.0 - 46.0 %   MCV 93.0 80.0 - 100.0 fL   MCH 30.1 26.0 - 34.0 pg   MCHC 32.4 30.0 - 36.0 g/dL   RDW 13.4 11.5 - 15.5 %   Platelets 309 150 - 400 K/uL   nRBC 0.0 0.0 - 0.2 %   Neutrophils Relative % 80 %   Neutro Abs 9.0 (H) 1.7 - 7.7 K/uL   Lymphocytes Relative 10 %   Lymphs Abs 1.1 0.7 - 4.0 K/uL   Monocytes Relative 10 %   Monocytes Absolute 1.1 (H) 0.1 - 1.0 K/uL   Eosinophils Relative 0 %   Eosinophils Absolute  0.0 0.0 - 0.5 K/uL   Basophils Relative 0 %   Basophils Absolute 0.0 0.0 - 0.1 K/uL   Immature Granulocytes 0 %   Abs Immature Granulocytes 0.04 0.00 - 0.07 K/uL    Comment: Performed at [facility], [place] [address] [place], [place] [address]  Basic metabolic panel     Status: Abnormal   Collection Time: 07/17/21 12:28 PM  Result Value Ref Range   Sodium 134 (L) 135 - 145 mmol/L   Potassium 3.7 3.5 - 5.1 mmol/L   Chloride 103 98 - 111 mmol/L   CO2 22 22 - 32 mmol/L   Glucose, Bld 118 (H) 70 - 99 mg/dL    Comment: Glucose reference range applies only to samples taken after fasting for at least 8 hours.   BUN 12 8 - 23 mg/dL   Creatinine, Ser 0.72 0.44 - 1.00 mg/dL   Calcium 8.8 (L) 8.9 - 10.3 mg/dL   GFR, Estimated >60 >60  mL/min    Comment: (NOTE) Calculated using the CKD-EPI Creatinine Equation (2021)    Anion gap 9 5 - 15    Comment: Performed at [facility] [address] [place], [place] [address]  Sample to Blood Bank     Status: None   Collection Time: 07/17/21 12:28 PM  Result Value Ref Range   Blood Bank Specimen SAMPLE AVAILABLE FOR TESTING    Sample Expiration      07/18/2021,2359 Performed at [facility], [place] [address], [place], [place] [address]     DG Chest 1 View  Result Date: 07/17/2021 CLINICAL DATA:  Fall, pain EXAM: CHEST  1 VIEW COMPARISON:  06/08/2021 FINDINGS: Stable heart size and vascularity. Large hiatal hernia projects over the cardiac silhouette as before. Lungs remain clear. Negative for pneumonia, edema, effusion pneumothorax trachea midline. Aorta is sclerotic. Lower cervical fusion hardware noted. No acute osseous finding. IMPRESSION: Stable chest exam. Large hiatal hernia. Electronically Signed   By: Jerilynn Mages.  Shick M.D.   On: 07/17/2021 11:44   DG Thoracic Spine 2 View  Result Date: 07/17/2021 CLINICAL DATA:  Fall, back pain, left humerus fracture EXAM: THORACIC SPINE 2 VIEWS COMPARISON:  07/17/2021 FINDINGS: Diffuse thoracic spine degenerative changes and mild S-shaped scoliosis. Slight increase kyphosis on the lateral view. lower cervical fusion hardware noted. No definite acute osseous finding or severe compression fracture. Normal paraspinous soft tissues. IMPRESSION: Diffuse thoracic spine degenerative changes and scoliosis. No acute finding by plain radiography. Electronically Signed   By: Jerilynn Mages.  Shick M.D.   On: 07/17/2021 11:53   DG Lumbar Spine Complete  Result Date: 07/17/2021 CLINICAL DATA:  Fall, pain, left humerus fracture EXAM: LUMBAR SPINE - COMPLETE 4+ VIEW COMPARISON:  07/17/2021 FINDINGS: Bones are osteopenic. Thoracolumbar scoliosis noted. No pars defects. Mild diffuse endplate degenerative changes. Preserved vertebral body heights without acute  compression fracture or focal kyphosis. Lower lumbar facet arthropathy. Aorta is atherosclerotic. Normal SI joints for age. Nonobstructive bowel gas pattern. IMPRESSION: 1. Osteopenia and thoracolumbar scoliosis. 2. No acute finding by plain radiography. Electronically Signed   By: Jerilynn Mages.  Shick M.D.   On: 07/17/2021 11:55   DG Pelvis 1-2 Views  Result Date: 07/17/2021 CLINICAL DATA:  Fall, left humerus fracture, pelvic pain EXAM: PELVIS - 1-2 VIEW COMPARISON:  07/17/2021 FINDINGS: Bones are osteopenic. Bony pelvis and hips appear symmetric and intact. Negative for fracture. Normal SI joints for age. No diastasis. Lower lumbar degenerative change present. IMPRESSION: Osteopenia. No acute finding by plain  radiography. Electronically Signed   By: Jerilynn Mages.  Shick M.D.   On: 07/17/2021 11:55   DG Forearm Left  Result Date: 07/17/2021 CLINICAL DATA:  Fall, injury, left humerus fracture EXAM: LEFT FOREARM - 2 VIEW COMPARISON:  None. FINDINGS: There is no evidence of fracture or other focal bone lesions. Soft tissues are unremarkable. Bones are osteopenic. IMPRESSION: No acute osseous finding. Osteopenia. Electronically Signed   By: Jerilynn Mages.  Shick M.D.   On: 07/17/2021 11:49   CT Head Wo Contrast  Result Date: 07/17/2021 CLINICAL DATA:  Head trauma EXAM: CT HEAD WITHOUT CONTRAST TECHNIQUE: Contiguous axial images were obtained from the base of the skull through the vertex without intravenous contrast. COMPARISON:  CT head 06/12/2021 FINDINGS: Brain: No acute intracranial hemorrhage, mass effect, or herniation. No extra-axial fluid collections. No evidence of acute territorial infarct. No hydrocephalus. Large areas of encephalomalacia again seen in the left MCA territory. Patchy hypodensities in the periventricular and subcortical white matter, likely secondary to chronic microvascular ischemic changes. Vascular: No hyperdense vessel or unexpected calcification. Skull: Normal. Negative for fracture or focal lesion.  Sinuses/Orbits: Small air-fluid levels in the bilateral maxillary sinuses and sphenoid sinuses, right greater than left. Moderate mucosal thickening in the ethmoid sinuses. Other: None. IMPRESSION: 1. Chronic changes with no acute intracranial process identified. 2. Paranasal sinus disease with air-fluid levels as described, correlate for acute sinusitis. Electronically Signed   By: Ofilia Neas M.D.   On: 07/17/2021 11:40   CT Cervical Spine Wo Contrast  Result Date: 07/17/2021 CLINICAL DATA:  Trauma EXAM: CT CERVICAL SPINE WITHOUT CONTRAST TECHNIQUE: Multidetector CT imaging of the cervical spine was performed without intravenous contrast. Multiplanar CT image reconstructions were also generated. COMPARISON:  None. FINDINGS: Alignment: No subluxation Skull base and vertebrae: No acute fracture. No primary bone lesion or focal pathologic process. Anterior cervical fusion hardware and intervertebral disc spacers from C4 through C6, hardware appears intact. Soft tissues and spinal canal: No prevertebral fluid or swelling. No visible canal hematoma. Disc levels:  No significant abnormality. Upper chest: Biapical pleural thickening noted. Other: None. IMPRESSION: No acute fracture or subluxation identified.  Postsurgical changes. Electronically Signed   By: Ofilia Neas M.D.   On: 07/17/2021 11:48   DG Shoulder Left  Result Date: 07/17/2021 CLINICAL DATA:  Fall, deformity, pain EXAM: LEFT SHOULDER - 2+ VIEW COMPARISON:  07/17/2021 FINDINGS: There is an acute minimally comminuted and displaced fracture of the left humerus midshaft. Alignment at the shoulder joint appears maintained. No subluxation or dislocation. Bones are osteopenic. AC joint also aligned. IMPRESSION: Acute left humerus displaced midshaft fracture. Electronically Signed   By: Jerilynn Mages.  Shick M.D.   On: 07/17/2021 11:46   DG Humerus Left  Result Date: 07/17/2021 CLINICAL DATA:  Fall, pain and deformity EXAM: LEFT HUMERUS - 2+ VIEW  COMPARISON:  07/17/2021 FINDINGS: There is an acute displaced and minimally comminuted left humerus midshaft fracture. Butterfly fragment noted. Bones are osteopenic. IMPRESSION: Acute left humerus midshaft fracture. Electronically Signed   By: Jerilynn Mages.  Shick M.D.   On: 07/17/2021 11:48   DG Hand Complete Left  Result Date: 07/17/2021 CLINICAL DATA:  Fall, deformity, left humerus fracture EXAM: LEFT HAND - COMPLETE 3+ VIEW COMPARISON:  07/17/2021 FINDINGS: Diffuse osteopenia. Degenerative changes of the wrist at the first [facility] joint with joint space loss and sclerosis. No acute osseous finding or fracture. No other joint abnormality. IMPRESSION: Osteopenia and osteoarthritis. No acute osseous finding. Electronically Signed   By: Jerilynn Mages.  Shick M.D.   On:  07/17/2021 11:52    Review of Systems  Unable to perform ROS: Patient nonverbal  Blood pressure 107/69, pulse 88, temperature 98.1 F (36.7 C), temperature source Oral, resp. rate 18, SpO2 96 %. Physical Exam Constitutional:      General: She is not in acute distress.    Appearance: She is well-developed. She is not diaphoretic.  HENT:     Head: Normocephalic and atraumatic.  Eyes:     General: No scleral icterus.       Right eye: No discharge.        Left eye: No discharge.     Conjunctiva/sclera: Conjunctivae normal.  Cardiovascular:     Rate and Rhythm: Normal rate and regular rhythm.  Pulmonary:     Effort: Pulmonary effort is normal. No respiratory distress.  Musculoskeletal:     Cervical back: Normal range of motion.     Comments: Left shoulder, elbow, wrist, digits- no skin wounds, severe TTP upper arm, no instability, no blocks to motion  Sens  Ax/R/M/U could not assess  Mot   Ax/ R/ PIN/ M/ AIN/ U could not assess  Rad 2+  Skin:    General: Skin is warm and dry.  Neurological:     Mental Status: She is alert.  Psychiatric:        Mood and Affect: Mood normal.        Behavior: Behavior normal.    Assessment/Plan: Left humerus fx  -- Plan ORIF Monday with Dr. Doreatha Martin. NWB LUE. Please hold Eliquis until after surgery.    Lisette Abu, PA-C Orthopedic Surgery [phone] 07/17/2021, 1:57 PM

## 2021-07-17 NOTE — Consult Note (Addendum)
NEUROLOGY CONSULTATION NOTE   Date of service: July 17, 2021 Patient Name: Mckenzie Guerrero MRN:  [id number] DOB:  08/01/1950 Reason for consult: "L MCA stroke on MRI Brain" Requesting Provider: Angelica Pou, MD _ _ _   _ __   _ __ _ _  __ __   _ __   __ _  History of Present Illness  Mckenzie Guerrero is a 71 y.o. 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 She had workup with MRI Brain without contrast which demonstrated new areas of acute stroke in the left MCA territory along with her prior stroke from Nov 2022.  Neurology consulted for further evaluation and management of the noted strokes.  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 I spoke to her son Mckenzie Guerrero over the phone. Mckenzie Guerrero reports that she has been dizzy and not feeling too good since tuesday and staggering when walking since Wednesday. Unclear what made her fall, she fell in the bathroom and Mckenzie Guerrero was not there. She has been using both sides after stroke in November. She mostly had language deficit after the stroke in November.  Has been on Eliquis, did not take her Eliquis this AM because she usually takes meds around 6 or 7AM. She does not miss any doses of medications otherwise.  Chart review demonstrated that she is positive for COVID today and has mild leukocytosis.    ROS  Unable to obtain due to review of system due to aphasia.  Past History   Past Medical History:  Diagnosis Date   Anxiety    Arthritis    osteoarthritis   Complication of anesthesia    GERD (gastroesophageal reflux disease)    Headache    migraines   History of bleeding ulcers 2010   History of hiatal hernia    Hypertension    Neuromuscular disorder  (HCC)    tremors   Occasional tremors    PONV (postoperative nausea and vomiting)    Past Surgical History:  Procedure Laterality Date   ANTERIOR CERVICAL DECOMP/DISCECTOMY FUSION N/A 09/28/2019   Procedure: Anterior Cervical Discectomy Fusion - Cervical Four-Cervical Five- Cervical Five-Cervical Six;  Surgeon: Earnie Larsson, MD;  Location: [place];  Service: Neurosurgery;  Laterality: N/A;  Anterior Cervical Discectomy Fusion - Cervical Four-Cervical Five- Cervical Five-Cervical Six   benign tumor removal from left breast 2003  2003   BUNIONECTOMY Left    Great toe   COLONOSCOPY     ESOPHAGOGASTRODUODENOSCOPY N/A 05/25/2021   Procedure: ESOPHAGOGASTRODUODENOSCOPY (EGD);  Surgeon: Johnathan Hausen, MD;  Location: [facility];  Service: General;  Laterality: N/A;   ESOPHAGOGASTRODUODENOSCOPY  2022   w biopsy.  Dr Odie Sera in [place].  prior to [facility] surgery 05/2021.   XI ROBOTIC ASSISTED HIATAL HERNIA REPAIR N/A 05/25/2021   Procedure: XI ROBOTIC ASSISTED HIATAL HERNIA REPAIR WITH FUNDOPLICATION;  Surgeon: Johnathan Hausen, MD;  Location: [facility];  Service: General;  Laterality: N/A;   No family history on file. Social History   Socioeconomic History   Marital status: Divorced    Spouse name: Not on file   Number of children: Not on file   Years of education: Not on file   Highest  education level: Not on file  Occupational History   Not on file  Tobacco Use   Smoking status: Former   Smokeless tobacco: Never  Vaping Use   Vaping Use: Never used  Substance and Sexual Activity   Alcohol use: Never   Drug use: Never   Sexual activity: Not on file  Other Topics Concern   Not on file  Social History Narrative   Not on file   Social Determinants of Health   Financial Resource Strain: Not on file  Food Insecurity: Not on file  Transportation Needs: Not on file  Physical Activity: Not on file  Stress: Not on file  Social Connections: Not on file   Allergies  Allergen Reactions    Aspirin Other (See Comments)    unknown   Codeine Hives and Itching   Doxycycline Nausea And Vomiting   Nsaids Other (See Comments)    Gi bleeding   Sulfonamide Derivatives Swelling   Prednisone Anxiety    Medications  (Not in a hospital admission)    Vitals   Vitals:   07/17/21 1341 07/17/21 1500 07/17/21 1800 07/17/21 2006  BP: 107/69 117/79 (!) 111/59 111/80  Pulse: 88 82 82 73  Resp: 18 17 13 16   Temp:      TempSrc:      SpO2: 96% 98% 96% 99%     There is no height or weight on file to calculate BMI.  Physical Exam   General: Laying comfortably in bed; in no acute distress.  HENT: Normal oropharynx and mucosa. Normal external appearance of ears and nose.  Neck: Supple, no pain or tenderness  CV: No JVD. No peripheral edema.  Pulmonary: Symmetric Chest rise. Normal respiratory effort.  Abdomen: Soft to touch, non-tender.  Ext: No cyanosis, edema. LUE wraped in bandage and splint. Skin: No rash. Normal palpation of skin.   Musculoskeletal: Normal digits and nails by inspection. No clubbing.  Neurologic Examination  Mental status/Cognition: Alert, awake with eyes open.  Tracks me around the room.  Unable to assess orientation due to aphasia.   Speech/language: Responds with okay to every question.  Unable to comprehend.  Unable to name objects.  She attempts to mimic.  Cranial nerves:   CN II Pupils equal and reactive to light, she blinks to threat bilaterally.   CN III,IV,VI She tracks me around the room. No gaze preference or deviation, no nystagmus   CN V normal sensation in V1, V2, and V3 segments bilaterally   CN VII Symmetric facial grimace   CN VIII Turns head towards speech   CN IX & X Unable to assess but did have a spontaneous cough for me.   CN XI Unable to assess due to aphasia   CN XII midline tongue but does not protrude to command.   Motor:  Muscle bulk: Poor, tone normal. Unable to assess strength in left upper extremity due to left humerus  fracture with her left arm in bandage and splint. Limited motor evaluation secondary to aphasia. Mvmt Root Nerve  Muscle Right Left Comments  SA C5/6 Ax Deltoid     EF C5/6 Mc Biceps     EE C6/7/8 Rad Triceps     WF C6/7 Med FCR     WE C7/8 PIN ECU     F Ab C8/T1 U ADM/FDI 5    HF L1/2/3 Fem Illopsoas 4+ 4+   KE L2/3/4 Fem Quad     DF L4/5 D Peron Tib Ant 4 4  PF S1/2 Tibial Grc/Sol 4 4    Reflexes:  Right Left Comments  Pectoralis      Biceps (C5/6) 2    Brachioradialis (C5/6) 2     Triceps (C6/7) 2     Patellar (L3/4) 2 2    Achilles (S1) 1 1    Hoffman      Plantar withdraws withdarws   Jaw jerk    Sensation:  Light touch Regards to touch in all extremities and localizes to mild pinch in all extremities.   Pin prick    Temperature    Vibration   Proprioception    Coordination/Complex Motor:  - Finger to Nose unable to assess but her movements are overall smooth and appear to be well coordinated. - Heel to shin unable to get her to do secondary to aphasia. - Rapid alternating movement unable to assess - Gait: Unsafe to assess given her recent fall. Labs   CBC:  Recent Labs  Lab 07/17/21 1228  WBC 11.4*  NEUTROABS 9.0*  HGB 12.4  HCT 38.3  MCV 93.0  PLT 932    Basic Metabolic Panel:  Lab Results  Component Value Date   NA 134 (L) 07/17/2021   K 3.7 07/17/2021   CO2 22 07/17/2021   GLUCOSE 118 (H) 07/17/2021   BUN 12 07/17/2021   CREATININE 0.72 07/17/2021   CALCIUM 8.8 (L) 07/17/2021   GFRNONAA >60 07/17/2021   GFRAA >60 09/26/2019   Lipid Panel:  Lab Results  Component Value Date   LDLCALC 139 (H) 06/09/2021   HgbA1c:  Lab Results  Component Value Date   HGBA1C 5.8 (H) 06/09/2021   Urine Drug Screen: No results found for: LABOPIA, COCAINSCRNUR, LABBENZ, AMPHETMU, THCU, LABBARB  Alcohol Level No results found for: [place]  CT Head without contrast(Personally reviewed): Chronic left MCA infarct otherwise no acute abnormality.  MR Angio  head without contrast and Carotid Duplex BL: pending  MRI Brain(Personally reviewed): Appears to have new areas of diffusion restriction along the periphery of her known left MCA stroke consistent with acute stroke.  Has encephalomalacia in the left MCA distribution.  Also has hemosiderin staining consistent with history of hemorrhagic transformation of her prior left MCA stroke. No ICH noted on earlier [facility].  Positive for COVID 19 with no Pneumonia on CXR.   Impression   Mckenzie Guerrero is a 71 y.o. 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 She had workup with MRI Brain without contrast which demonstrated new areas of acute stroke in the left MCA territory along with her prior stroke from Nov 2022. She is also positive for COVID 19 with no Pneumonia on CXR.  Primary Diagnosis:  Cerebral infarction due to embolism of  left middle cerebral artery.   Secondary Diagnosis: Essential (primary) hypertension and Paroxysmal atrial fibrillation  Recommendations  Plan:  - Frequent Neuro checks per stroke unit protocol - Recommend Vascular imaging with MRA Angio Head without contrast and US Carotid doppler - No need to repeat TTE, recently had in Nov 2022 with EF of 60-65% - Recommend obtaining Lipid panel with LDL - Continue home Atorvastatin 40mg daily for now. - Recommend HbA1c - Antithrombotic - Aspirin 81mg  daily for now. - Given new stroke and prior history of  hemorrhagic transformation and a fall today, I would recommend holding off on Anticoagulation for now. Can resume Anticoagulation in a few days. Please discontinue Aspirin and DVT Prophylaxis when she is transitioned to Anticoagulation. - Recommend DVT ppx - SBP goal - recommend permissive  hypertension with goal SBP < 180 for now - Recommend Telemetry monitoring for arrythmia - Recommend bedside swallow screen prior to PO intake. - Stroke education booklet - Recommend PT/OT/SLP consult.  ______________________________________________________________________  Plan discussed with IM residency team overnight.  Thank you for the opportunity to take part in the care of this patient. If you have any further questions, please contact the neurology consultation attending.  Signed,  [place] Pager Number [phone] _ _ _   _ __   _ __ _ _  __ __   _ __   __ _

## 2021-07-17 NOTE — ED Triage Notes (Addendum)
Pt arrived via [facility] with c/c of fall. Pt atttemoted to caught self. Pt arrives with left arm deformity. Hx of stroke, and non-verbal. Pt able to answer yes and no questions. Pt on Eliquis per hospital records  94%, 83HR, 138 CBG, 100/68 50 Fent

## 2021-07-17 NOTE — Consult Note (Addendum)
Reason for Consult:Left humerus fx Referring Physician: Lavenia Atlas Time called: 1700 Time at bedside: Mckenzie Guerrero is an 71 y.o. female.  HPI: Mckenzie Guerrero fell in the bathroom this morning about 0600. It unclear what caused her to fall. She had a stroke about 3 weeks ago but has been doing well according to son. She is nonverbal and cannot contribute to history. She is RHD and does not use any assistive devices to ambulate.  Past Medical History:  Diagnosis Date   Anxiety    Arthritis    osteoarthritis   Complication of anesthesia    GERD (gastroesophageal reflux disease)    Headache    migraines   History of bleeding ulcers 2010   History of hiatal hernia    Hypertension    Neuromuscular disorder (HCC)    tremors   Occasional tremors    PONV (postoperative nausea and vomiting)     Past Surgical History:  Procedure Laterality Date   ANTERIOR CERVICAL DECOMP/DISCECTOMY FUSION N/A 09/28/2019   Procedure: Anterior Cervical Discectomy Fusion - Cervical Four-Cervical Five- Cervical Five-Cervical Six;  Surgeon: Earnie Larsson, MD;  Location: [place];  Service: Neurosurgery;  Laterality: N/A;  Anterior Cervical Discectomy Fusion - Cervical Four-Cervical Five- Cervical Five-Cervical Six   benign tumor removal from left breast 2003  2003   BUNIONECTOMY Left    Great toe   COLONOSCOPY     ESOPHAGOGASTRODUODENOSCOPY N/A 05/25/2021   Procedure: ESOPHAGOGASTRODUODENOSCOPY (EGD);  Surgeon: Johnathan Hausen, MD;  Location: [facility];  Service: General;  Laterality: N/A;   ESOPHAGOGASTRODUODENOSCOPY  2022   w biopsy.  Dr Odie Sera in [place].  prior to [facility] surgery 05/2021.   XI ROBOTIC ASSISTED HIATAL HERNIA REPAIR N/A 05/25/2021   Procedure: XI ROBOTIC ASSISTED HIATAL HERNIA REPAIR WITH FUNDOPLICATION;  Surgeon: Johnathan Hausen, MD;  Location[facility];  Service: General;  Laterality: N/A;    No family history on file.  Social History:  reports that she has quit smoking. She has never  used smokeless tobacco. She reports that she does not drink alcohol and does not use drugs.  Allergies:  Allergies  Allergen Reactions   Aspirin Other (See Comments)    unknown   Codeine Hives and Itching   Doxycycline Nausea And Vomiting   Nsaids Other (See Comments)    Gi bleeding   Sulfonamide Derivatives Swelling   Prednisone Anxiety    Medications: I have reviewed the patient's current medications.  Results for orders placed or performed during the hospital encounter of 07/17/21 (from the past 48 hour(s))  CBC with Differential     Status: Abnormal   Collection Time: 07/17/21 12:28 PM  Result Value Ref Range   WBC 11.4 (H) 4.0 - 10.5 K/uL   RBC 4.12 3.87 - 5.11 MIL/uL   Hemoglobin 12.4 12.0 - 15.0 g/dL   HCT 38.3 36.0 - 46.0 %   MCV 93.0 80.0 - 100.0 fL   MCH 30.1 26.0 - 34.0 pg   MCHC 32.4 30.0 - 36.0 g/dL   RDW 13.4 11.5 - 15.5 %   Platelets 309 150 - 400 K/uL   nRBC 0.0 0.0 - 0.2 %   Neutrophils Relative % 80 %   Neutro Abs 9.0 (H) 1.7 - 7.7 K/uL   Lymphocytes Relative 10 %   Lymphs Abs 1.1 0.7 - 4.0 K/uL   Monocytes Relative 10 %   Monocytes Absolute 1.1 (H) 0.1 - 1.0 K/uL   Eosinophils Relative 0 %   Eosinophils Absolute  0.0 0.0 - 0.5 K/uL   Basophils Relative 0 %   Basophils Absolute 0.0 0.0 - 0.1 K/uL   Immature Granulocytes 0 %   Abs Immature Granulocytes 0.04 0.00 - 0.07 K/uL    Comment: Performed at [facility], [place] [address], [place], [place] [address]  Basic metabolic panel     Status: Abnormal   Collection Time: 07/17/21 12:28 PM  Result Value Ref Range   Sodium 134 (L) 135 - 145 mmol/L   Potassium 3.7 3.5 - 5.1 mmol/L   Chloride 103 98 - 111 mmol/L   CO2 22 22 - 32 mmol/L   Glucose, Bld 118 (H) 70 - 99 mg/dL    Comment: Glucose reference range applies only to samples taken after fasting for at least 8 hours.   BUN 12 8 - 23 mg/dL   Creatinine, Ser 0.72 0.44 - 1.00 mg/dL   Calcium 8.8 (L) 8.9 - 10.3 mg/dL   GFR, Estimated >60 >60  mL/min    Comment: (NOTE) Calculated using the CKD-EPI Creatinine Equation (2021)    Anion gap 9 5 - 15    Comment: Performed at [facility] [address], [place], [name] [address]  Sample to Blood Bank     Status: None   Collection Time: 07/17/21 12:28 PM  Result Value Ref Range   Blood Bank Specimen SAMPLE AVAILABLE FOR TESTING    Sample Expiration      07/18/2021,2359 Performed at [facility], [place] [address], [place], [place] [address]     DG Chest 1 View  Result Date: 07/17/2021 CLINICAL DATA:  Fall, pain EXAM: CHEST  1 VIEW COMPARISON:  06/08/2021 FINDINGS: Stable heart size and vascularity. Large hiatal hernia projects over the cardiac silhouette as before. Lungs remain clear. Negative for pneumonia, edema, effusion pneumothorax trachea midline. Aorta is sclerotic. Lower cervical fusion hardware noted. No acute osseous finding. IMPRESSION: Stable chest exam. Large hiatal hernia. Electronically Signed   By: Jerilynn Mages.  Shick M.D.   On: 07/17/2021 11:44   DG Thoracic Spine 2 View  Result Date: 07/17/2021 CLINICAL DATA:  Fall, back pain, left humerus fracture EXAM: THORACIC SPINE 2 VIEWS COMPARISON:  07/17/2021 FINDINGS: Diffuse thoracic spine degenerative changes and mild S-shaped scoliosis. Slight increase kyphosis on the lateral view. lower cervical fusion hardware noted. No definite acute osseous finding or severe compression fracture. Normal paraspinous soft tissues. IMPRESSION: Diffuse thoracic spine degenerative changes and scoliosis. No acute finding by plain radiography. Electronically Signed   By: Jerilynn Mages.  Shick M.D.   On: 07/17/2021 11:53   DG Lumbar Spine Complete  Result Date: 07/17/2021 CLINICAL DATA:  Fall, pain, left humerus fracture EXAM: LUMBAR SPINE - COMPLETE 4+ VIEW COMPARISON:  07/17/2021 FINDINGS: Bones are osteopenic. Thoracolumbar scoliosis noted. No pars defects. Mild diffuse endplate degenerative changes. Preserved vertebral body heights without acute  compression fracture or focal kyphosis. Lower lumbar facet arthropathy. Aorta is atherosclerotic. Normal SI joints for age. Nonobstructive bowel gas pattern. IMPRESSION: 1. Osteopenia and thoracolumbar scoliosis. 2. No acute finding by plain radiography. Electronically Signed   By: Jerilynn Mages.  Shick M.D.   On: 07/17/2021 11:55   DG Pelvis 1-2 Views  Result Date: 07/17/2021 CLINICAL DATA:  Fall, left humerus fracture, pelvic pain EXAM: PELVIS - 1-2 VIEW COMPARISON:  07/17/2021 FINDINGS: Bones are osteopenic. Bony pelvis and hips appear symmetric and intact. Negative for fracture. Normal SI joints for age. No diastasis. Lower lumbar degenerative change present. IMPRESSION: Osteopenia. No acute finding by plain  radiography. Electronically Signed   By: Jerilynn Mages.  Shick M.D.   On: 07/17/2021 11:55   DG Forearm Left  Result Date: 07/17/2021 CLINICAL DATA:  Fall, injury, left humerus fracture EXAM: LEFT FOREARM - 2 VIEW COMPARISON:  None. FINDINGS: There is no evidence of fracture or other focal bone lesions. Soft tissues are unremarkable. Bones are osteopenic. IMPRESSION: No acute osseous finding. Osteopenia. Electronically Signed   By: Jerilynn Mages.  Shick M.D.   On: 07/17/2021 11:49   CT Head Wo Contrast  Result Date: 07/17/2021 CLINICAL DATA:  Head trauma EXAM: CT HEAD WITHOUT CONTRAST TECHNIQUE: Contiguous axial images were obtained from the base of the skull through the vertex without intravenous contrast. COMPARISON:  CT head 06/12/2021 FINDINGS: Brain: No acute intracranial hemorrhage, mass effect, or herniation. No extra-axial fluid collections. No evidence of acute territorial infarct. No hydrocephalus. Large areas of encephalomalacia again seen in the left MCA territory. Patchy hypodensities in the periventricular and subcortical white matter, likely secondary to chronic microvascular ischemic changes. Vascular: No hyperdense vessel or unexpected calcification. Skull: Normal. Negative for fracture or focal lesion.  Sinuses/Orbits: Small air-fluid levels in the bilateral maxillary sinuses and sphenoid sinuses, right greater than left. Moderate mucosal thickening in the ethmoid sinuses. Other: None. IMPRESSION: 1. Chronic changes with no acute intracranial process identified. 2. Paranasal sinus disease with air-fluid levels as described, correlate for acute sinusitis. Electronically Signed   By: Ofilia Neas M.D.   On: 07/17/2021 11:40   CT Cervical Spine Wo Contrast  Result Date: 07/17/2021 CLINICAL DATA:  Trauma EXAM: CT CERVICAL SPINE WITHOUT CONTRAST TECHNIQUE: Multidetector CT imaging of the cervical spine was performed without intravenous contrast. Multiplanar CT image reconstructions were also generated. COMPARISON:  None. FINDINGS: Alignment: No subluxation Skull base and vertebrae: No acute fracture. No primary bone lesion or focal pathologic process. Anterior cervical fusion hardware and intervertebral disc spacers from C4 through C6, hardware appears intact. Soft tissues and spinal canal: No prevertebral fluid or swelling. No visible canal hematoma. Disc levels:  No significant abnormality. Upper chest: Biapical pleural thickening noted. Other: None. IMPRESSION: No acute fracture or subluxation identified.  Postsurgical changes. Electronically Signed   By: Ofilia Neas M.D.   On: 07/17/2021 11:48   DG Shoulder Left  Result Date: 07/17/2021 CLINICAL DATA:  Fall, deformity, pain EXAM: LEFT SHOULDER - 2+ VIEW COMPARISON:  07/17/2021 FINDINGS: There is an acute minimally comminuted and displaced fracture of the left humerus midshaft. Alignment at the shoulder joint appears maintained. No subluxation or dislocation. Bones are osteopenic. AC joint also aligned. IMPRESSION: Acute left humerus displaced midshaft fracture. Electronically Signed   By: Jerilynn Mages.  Shick M.D.   On: 07/17/2021 11:46   DG Humerus Left  Result Date: 07/17/2021 CLINICAL DATA:  Fall, pain and deformity EXAM: LEFT HUMERUS - 2+ VIEW  COMPARISON:  07/17/2021 FINDINGS: There is an acute displaced and minimally comminuted left humerus midshaft fracture. Butterfly fragment noted. Bones are osteopenic. IMPRESSION: Acute left humerus midshaft fracture. Electronically Signed   By: Jerilynn Mages.  Shick M.D.   On: 07/17/2021 11:48   DG Hand Complete Left  Result Date: 07/17/2021 CLINICAL DATA:  Fall, deformity, left humerus fracture EXAM: LEFT HAND - COMPLETE 3+ VIEW COMPARISON:  07/17/2021 FINDINGS: Diffuse osteopenia. Degenerative changes of the wrist at the first [facility] joint with joint space loss and sclerosis. No acute osseous finding or fracture. No other joint abnormality. IMPRESSION: Osteopenia and osteoarthritis. No acute osseous finding. Electronically Signed   By: Jerilynn Mages.  Shick M.D.   On:  07/17/2021 11:52    Review of Systems  Unable to perform ROS: Patient nonverbal  Blood pressure 107/69, pulse 88, temperature 98.1 F (36.7 C), temperature source Oral, resp. rate 18, SpO2 96 %. Physical Exam Constitutional:      General: She is not in acute distress.    Appearance: She is well-developed. She is not diaphoretic.  HENT:     Head: Normocephalic and atraumatic.  Eyes:     General: No scleral icterus.       Right eye: No discharge.        Left eye: No discharge.     Conjunctiva/sclera: Conjunctivae normal.  Cardiovascular:     Rate and Rhythm: Normal rate and regular rhythm.  Pulmonary:     Effort: Pulmonary effort is normal. No respiratory distress.  Musculoskeletal:     Cervical back: Normal range of motion.     Comments: Left shoulder, elbow, wrist, digits- no skin wounds, severe TTP upper arm, no instability, no blocks to motion  Sens  Ax/R/M/U could not assess  Mot   Ax/ R/ PIN/ M/ AIN/ U could not assess  Rad 2+  Skin:    General: Skin is warm and dry.  Neurological:     Mental Status: She is alert.  Psychiatric:        Mood and Affect: Mood normal.        Behavior: Behavior normal.    Assessment/Plan: Left humerus fx  -- Plan ORIF Monday with Dr. Doreatha Martin. NWB LUE. Please hold Eliquis until after surgery.    Lisette Abu, PA-C Orthopedic Surgery [phone] 07/17/2021, 1:57 PM

## 2021-07-17 NOTE — ED Notes (Signed)
Blue top and pink top drawn at this time.

## 2021-07-17 NOTE — H&P (Signed)
Date: 07/17/2021               Patient Name:  Mckenzie Guerrero MRN: [id number]  DOB: 08/30/1950 Age / Sex: 71 y.o., female   PCP: Raina Mina., MD         Medical Service: Internal Medicine Teaching Service         Attending Physician: Dr. Jimmye Norman, Elaina Pattee, MD    First Contact: Dr. Raymondo Band Pager: [phone]  Second Contact: Dr. Collene Gobble Pager: [phone]       After Hours (After 5p/  First Contact Pager: [phone]  weekends / holidays): Second Contact Pager: [phone]   Chief Complaint: fall  History of Present Illness: Mckenzie Guerrero is a 71 yo female with HTN, Afib on Eliquis, and recent CVA (admitted 11/28-12/3; at [facility] from 12/3-12/20) who presented to the hospital after a fall.   The patient has non-intelligible speech at baseline secondary to her recent CVA, so history was obtained from her son, Mckenzie Guerrero, who was at the bedside. He states that this morning the patient fell while she was in her bathroom. He is unsure if she lost consciousness, but states that she called out to her boyfriend who helped her stand up after and brought her to the ED. The patient has had difficulties with balance and gait over the last few days. 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 She attempted to catch herself during the fall with her left arm and was found sitting on the ground by her boyfriend. The patient endorses significant pain in her left arm and is unable to move it now. Denies any fevers, chills, chest pain, sob, abd pain, n/v/d, back pain, leg pain, numbness/tingling, or any other sxs at this time.   Meds:  Current Meds  Medication Sig   acetaminophen (TYLENOL) 325 MG tablet Take 2 tablets (650 mg total) by mouth 3 (three) times daily.   apixaban (ELIQUIS) 5 MG TABS tablet Take 1 tablet (5 mg total) by mouth 2 (two) times daily.   atorvastatin (LIPITOR) 40 MG tablet Take 1 tablet (40 mg  total) by mouth daily.   B Complex-C (B-COMPLEX WITH VITAMIN C) tablet Take 1 tablet by mouth daily.   lidocaine (LIDODERM) 5 % Place 1 patch onto the skin daily. To lower back--on at 8 am and remove at 8 pm daily. Has to be off for 12 hours   megestrol (MEGACE) 40 MG/ML suspension Take 10 mLs (400 mg total) by mouth daily.   metoprolol succinate (TOPROL-XL) 25 MG 24 hr tablet Take 1/2 tablet (12.5 mg total) by mouth daily.   Nystatin (GERHARDT'S BUTT CREAM) CREA Apply 1 application topically 3 (three) times daily. (Patient taking differently: Apply 1 application topically 3 (three) times daily as needed for irritation.)     Allergies: Allergies as of 07/17/2021 - Review Complete 07/17/2021  Allergen Reaction Noted   Aspirin Other (See Comments) 06/10/2021   Codeine Hives and Itching 04/16/2010   Doxycycline Nausea And Vomiting 08/21/2015   Nsaids Other (See Comments) 08/21/2015   Sulfonamide derivatives Swelling 04/16/2010   Prednisone Anxiety 08/21/2015   Past Medical History:  Diagnosis Date   Anxiety    Arthritis    osteoarthritis   Complication of anesthesia    GERD (gastroesophageal reflux disease)    Headache    migraines   History of bleeding ulcers 2010   History  of hiatal hernia    Hypertension    Neuromuscular disorder (HCC)    tremors   Occasional tremors    PONV (postoperative nausea and vomiting)     Family History: HTN  Social History: Lives alone in [place], but the patient's boyfriend and her son have been alternating staying with her recently, since her stroke.  No tobacco, alcohol, or illicit drug use.  PCP is Dr. Bea Graff  Review of Systems: A complete ROS was negative except as per HPI.   Physical Exam: Blood pressure 107/69, pulse 88, temperature 98.1 F (36.7 C), temperature source Oral, resp. rate 18, SpO2 96 %. Physical Exam Constitutional:      General: She is not in acute distress.    Comments: Appears to be in moderate pain  HENT:      Head: Normocephalic and atraumatic.  Eyes:     Extraocular Movements: Extraocular movements intact.     Pupils: Pupils are equal, round, and reactive to light.  Cardiovascular:     Rate and Rhythm: Normal rate and regular rhythm.     Pulses: Normal pulses.     Heart sounds: No murmur heard. Pulmonary:     Effort: Pulmonary effort is normal. No respiratory distress.     Breath sounds: Normal breath sounds. No wheezing, rhonchi or rales.  Abdominal:     General: Bowel sounds are normal. There is no distension.     Palpations: Abdomen is soft.     Tenderness: There is no abdominal tenderness.  Musculoskeletal:        General: Tenderness and signs of injury present.     Right lower leg: No edema.     Left lower leg: No edema.     Comments: LUE is adducted and flexed at the elbow, in a sling  Skin:    General: Skin is warm and dry.  Neurological:     Mental Status: She is alert. Mental status is at baseline.     Comments: Non intelligible speech  Diminished RUE/RLE strength 2/2 recent CVA Unable to assess LUE strength 2/2 fracture     CXR: personally reviewed my interpretation is large hiatal hernia  Assessment & Plan by Problem: Principal Problem:   Humerus fracture  #Left midshaft humerus fracture Patient fell and attempted to catch herself with her left upper extremity, sustaining a left midshaft humeral fracture. CT head, cervical spine, DG thoracic/lumbar spine, pelvis, and chest with no acute abnormalities. Ortho consulted from the ED with surgical plans for Monday, as patient has been on Eliquis.  - ORIF on Monday  - Hold eliquis until after surgery  - NWB LUE - Pain mgmt: scheduled norco 5-325 mg q4h and prn dilaudid 0.5mg  q4h prn  - PT/OT  #Balance concerns Patient with worsening gait and balance over the past few days, and sustained a fall this morning. CT head negative for acute intracranial process, however, ED was concerned for new stroke. Patient has  non-intelligible speech at her baseline and does not appear to have any facial droop or one sided weakness beyond her baseline.  - MRI brain pending  #Paroxysmal Afib  #Hypertension  Patient in normal sinus rhythm on evaluation. On eliquis 5 mg bid and metoprolol 12.5 mg daily at home. Resumed metoprolol and will hold eliquis in the setting of upcoming ORIF.  - Cardiac monitoring x 24 hrs  #Previous CVA 05/2021 #Hyperlipidemia Resumed home lipitor 40 mg daily.    Best practices: Code: Full VTE: Heparin  Diet: Heart healthy  IVF: None   Dispo: Admit patient to Inpatient with expected length of stay greater than 2 midnights.  SignedDorethea Clan, DO 07/17/2021, 3:06 PM  Pager: [phone] After 5pm on weekdays and 1pm on weekends: On Call pager: [phone]

## 2021-07-17 NOTE — ED Provider Notes (Signed)
[facility] EMERGENCY DEPARTMENT Provider Note   CSN: [id number] Arrival date & time: 07/17/21  8841     History  Chief Complaint  Patient presents with   Fall   Left Arm Injury    Mckenzie Guerrero is a 71 y.o. female.  HPI  70 year old female who is nonverbal at baseline secondary to CVA presents to the emergency department by EMS with concern for fall.  There is no family accompanying the patient, EMS limited on information but describe it as a mechanical fall with an attempt to catch herself with the left upper extremity.  Patient is anticoagulated on Eliquis.  Patient is able to answer yes or no but is inconsistent with that as well, sometimes says yes/no to every answer.  History limited secondary to this.  Level 5 caveat.  Home Medications Prior to Admission medications   Medication Sig Start Date End Date Taking? Authorizing Provider  acetaminophen (TYLENOL) 325 MG tablet Take 2 tablets (650 mg total) by mouth 3 (three) times daily. 06/29/21   Love, Ivan Anchors, PA-C  apixaban (ELIQUIS) 5 MG TABS tablet Take 1 tablet (5 mg total) by mouth 2 (two) times daily. 06/29/21   Love, Ivan Anchors, PA-C  atorvastatin (LIPITOR) 40 MG tablet Take 1 tablet (40 mg total) by mouth daily. 06/29/21   Love, Ivan Anchors, PA-C  B Complex-C (B-COMPLEX WITH VITAMIN C) tablet Take 1 tablet by mouth daily. 06/30/21   Love, Ivan Anchors, PA-C  lidocaine (LIDODERM) 5 % Place 1 patch onto the skin daily. To lower back--on at 8 am and remove at 8 pm daily. Has to be off for 12 hours 06/29/21   Love, Ivan Anchors, PA-C  megestrol (MEGACE) 40 MG/ML suspension Take 10 mLs (400 mg total) by mouth daily. 06/30/21   Love, Ivan Anchors, PA-C  metoprolol succinate (TOPROL-XL) 25 MG 24 hr tablet Take 1/2 tablet (12.5 mg total) by mouth daily. 06/29/21   Love, Ivan Anchors, PA-C  Nystatin (GERHARDT'S BUTT CREAM) CREA Apply 1 application topically 3 (three) times daily. 06/29/21   Love, Ivan Anchors, PA-C      Allergies     Aspirin, Codeine, Doxycycline, Nsaids, Sulfonamide derivatives, and Prednisone    Review of Systems   Review of Systems  Unable to perform ROS: Patient nonverbal   Physical Exam Updated Vital Signs BP 131/64    Pulse (!) 102    Temp 98.1 F (36.7 C) (Oral)    Resp 18    SpO2 97%  Physical Exam Vitals and nursing note reviewed.  Constitutional:      General: She is not in acute distress.    Appearance: Normal appearance.  HENT:     Head: Normocephalic and atraumatic.     Right Ear: External ear normal.     Left Ear: External ear normal.     Nose: Nose normal.     Mouth/Throat:     Mouth: Mucous membranes are moist.  Eyes:     Pupils: Pupils are equal, round, and reactive to light.  Cardiovascular:     Rate and Rhythm: Normal rate.  Pulmonary:     Effort: Pulmonary effort is normal. No respiratory distress.  Abdominal:     Palpations: Abdomen is soft.     Tenderness: There is no abdominal tenderness.     Comments: No bruising  Musculoskeletal:     Cervical back: No rigidity or tenderness.     Comments: Left upper extremity splinted prior to arrival, tenderness to  palpation from the left shoulder to the left hand with intermittent areas of swelling and ecchymosis, equal palpable radial pulse, hesitant to move the left hand for grip strength secondary to pain.  Paraspinal muscular tenderness in the thoracic and lumbar region, pelvis is stable, no other obvious tenderness to palpation of bony prominences.  Skin:    General: Skin is warm.  Neurological:     Mental Status: She is alert. Mental status is at baseline.    ED Results / Procedures / Treatments   Labs (all labs ordered are listed, but only abnormal results are displayed) Labs Reviewed - No data to display  EKG None  Radiology No results found.  Procedures Procedures    Medications Ordered in ED Medications  fentaNYL (SUBLIMAZE) injection 75 mcg (has no administration in time range)    ED Course/  Medical Decision Making/ A&P                           Medical Decision Making  Level 5 caveat due to nonverbal state.  This patient presents to the ED for concern of fall versus syncope, left upper extremity injury, this involves an extensive number of treatment options, and is a complaint that carries with it a high risk of complications and morbidity.  The differential diagnosis includes syncope, seizure, stroke, mechanical fall, arm fracture, head injury   Additional history obtained: -Additional history obtained from brother at bedside and boyfriend from home -External records from outside source obtained and reviewed including: Chart review including previous notes, labs, imaging, consultation notes   Lab Tests: -I ordered, reviewed, and interpreted labs.  The pertinent results include: Baseline lab findings   Imaging Studies ordered: -I ordered imaging studies including CT of the head, CT cervical spine, spine x-ray, extremity x-rays -I independently visualized and interpreted imaging which showed left comminuted displaced midshaft humerus fracture -I agree with the radiologist interpretation   Medicines ordered and prescription drug management: -I ordered medication including fentanyl for pain control -Reevaluation of the patient after these medicines showed that the patient improved -I have reviewed the patients home medicines and have made adjustments as needed   Consultations Obtained: I requested consultation with the orthopedic PA, Hilbert Odor,  and discussed lab and imaging findings as well as pertinent plan - they recommend: Arm immobilization and will come see the patient in consult, potential inpatient surgical correction   ED Course: 71 year old female presents emergency department for what appears to be a mechanical fall.  She lives with her boyfriend, got up to use the restroom, boyfriend heard a loud crash and found her sitting on the ground.  It appears  she tried to catch herself with the left upper extremity.  Patient is able to respond with yes/no but her answers are interchangeable and inconsistent, very difficult interview.  No obvious head injury.  Given her anticoagulated state CT imaging was done.  CT of the head and cervical spine are unremarkable.  X-rays of the back, pelvis showed no acute fracture.  Left upper extremity imaging shows a left midshaft comminuted and displaced humerus fracture.  She does have palpable radial pulse, after pain control ranges the hand better than on prior to arrival.  Daughter called the brother at bedside and also reported concern for worsening gait and balance over the past 3 days.  Concern for new stroke.  MRI of the brain has been added on.  From a medical standpoint she is in  poor condition in terms of safety for discharge home.  Spoke with orthopedics in consultation, plan for medical admission for pain control, orthopedic evaluation.   Cardiac Monitoring: The patient was maintained on a cardiac monitor.  I personally viewed and interpreted the cardiac monitored which showed an underlying rhythm of: Sinus rhythm   Reevaluation: After the interventions noted above, I reevaluated the patient and found that they have :improved   Dispostion: Patients evaluation and results requires admission for further treatment and care.  Spoke with internal medicine, reviewed patient's ED course and they accept admission.  Patient agrees with admission plan, offers no new complaints and is stable/unchanged at time of admit.         Final Clinical Impression(s) / ED Diagnoses Final diagnoses:  Fall    Rx / DC Orders ED Discharge Orders     None         Lorelle Gibbs, DO 07/17/21 1423

## 2021-07-17 NOTE — Progress Notes (Signed)
ANTICOAGULATION CONSULT NOTE - Initial Consult  Pharmacy Consult for IV heparin Indication: atrial fibrillation and history of stroke  Allergies  Allergen Reactions   Aspirin Other (See Comments)    unknown   Codeine Hives and Itching   Doxycycline Nausea And Vomiting   Nsaids Other (See Comments)    Gi bleeding   Sulfonamide Derivatives Swelling   Prednisone Anxiety    Patient Measurements:   Heparin Dosing Weight: TBW  Vital Signs: Temp: 98.1 F (36.7 C) (01/06 0822) Temp Source: Oral (01/06 0822) BP: 117/79 (01/06 1500) Pulse Rate: 82 (01/06 1500)  Labs: Recent Labs    07/17/21 1228  HGB 12.4  HCT 38.3  PLT 309  CREATININE 0.72    CrCl cannot be calculated (Unknown ideal weight.).   Medical History: Past Medical History:  Diagnosis Date   Anxiety    Arthritis    osteoarthritis   Complication of anesthesia    GERD (gastroesophageal reflux disease)    Headache    migraines   History of bleeding ulcers 2010   History of hiatal hernia    Hypertension    Neuromuscular disorder (HCC)    tremors   Occasional tremors    PONV (postoperative nausea and vomiting)    Assessment: 16 YOF admitted for a fall with Left humerus fracture. PMH significant for Afib and recent stroke (06/2021) on Eliquis PTA. Last dose was taken 07/16/21 @ 2200. Patient to undergo ORIF surgery Monday, 1/9 and needing Eliquis washout prior to surgery. Will need heparin bridge. Pharmacy to dose heparin.   Hgb and Platelets are stable and within normal limits. Scr at baseline. Given recent Eliquis use, will need to trend aPTT and heparin level until they both correlate.   Goal of Therapy:  Heparin level 0.3-0.7 units/ml Monitor platelets by anticoagulation protocol: Yes   Plan:  Start IV heparin gtt @ 850 units/hr 8h heparin level, aPTT Daily heparin level, aPTT, CBC Monitor for signs and symptoms of bleeding  Thank you for involving pharmacy in this patient's care.  Elita Quick, PharmD PGY1 Ambulatory Care Pharmacy Resident 07/17/2021 5:01 PM  **Pharmacist phone directory can be found on [email or web address] listed under [place]**

## 2021-07-18 ENCOUNTER — Encounter (HOSPITAL_COMMUNITY): Payer: Self-pay | Admitting: Internal Medicine

## 2021-07-18 DIAGNOSIS — S42352A Displaced comminuted fracture of shaft of humerus, left arm, initial encounter for closed fracture: Secondary | ICD-10-CM | POA: Diagnosis not present

## 2021-07-18 DIAGNOSIS — W19XXXA Unspecified fall, initial encounter: Secondary | ICD-10-CM | POA: Diagnosis not present

## 2021-07-18 DIAGNOSIS — E7800 Pure hypercholesterolemia, unspecified: Secondary | ICD-10-CM

## 2021-07-18 DIAGNOSIS — Z8673 Personal history of transient ischemic attack (TIA), and cerebral infarction without residual deficits: Secondary | ICD-10-CM

## 2021-07-18 DIAGNOSIS — Y92009 Unspecified place in unspecified non-institutional (private) residence as the place of occurrence of the external cause: Secondary | ICD-10-CM

## 2021-07-18 DIAGNOSIS — I10 Essential (primary) hypertension: Secondary | ICD-10-CM

## 2021-07-18 LAB — HEPARIN LEVEL (UNFRACTIONATED): Heparin Unfractionated: >1.1 IU/mL — ABNORMAL HIGH (ref 0.30–0.70)

## 2021-07-18 LAB — URINALYSIS, COMPLETE (UACMP) WITH MICROSCOPIC
Bilirubin Urine: NEGATIVE
Glucose, UA: NEGATIVE mg/dL
Ketones, ur: NEGATIVE mg/dL
Nitrite: NEGATIVE
Protein, ur: 30 mg/dL — AB
Specific Gravity, Urine: >1.03 — ABNORMAL HIGH (ref 1.005–1.030)
Squamous Epithelial / HPF: NONE SEEN (ref 0–5)
WBC, UA: >50 WBC/hpf (ref 0–5)
pH: 6 (ref 5.0–8.0)

## 2021-07-18 LAB — COMPREHENSIVE METABOLIC PANEL
ALT: 21 U/L (ref 0–44)
AST: 25 U/L (ref 15–41)
Albumin: 3.1 g/dL — ABNORMAL LOW (ref 3.5–5.0)
Alkaline Phosphatase: 65 U/L (ref 38–126)
Anion gap: 11 (ref 5–15)
BUN: 15 mg/dL (ref 8–23)
CO2: 20 mmol/L — ABNORMAL LOW (ref 22–32)
Calcium: 9 mg/dL (ref 8.9–10.3)
Chloride: 105 mmol/L (ref 98–111)
Creatinine, Ser: 0.79 mg/dL (ref 0.44–1.00)
GFR, Estimated: >60 mL/min (ref >60)
Glucose, Bld: 95 mg/dL (ref 70–99)
Potassium: 3.6 mmol/L (ref 3.5–5.1)
Sodium: 136 mmol/L (ref 135–145)
Total Bilirubin: 0.7 mg/dL (ref 0.3–1.2)
Total Protein: 6.2 g/dL — ABNORMAL LOW (ref 6.5–8.1)

## 2021-07-18 LAB — CBC
HCT: 38.1 % (ref 36.0–46.0)
Hemoglobin: 12.3 g/dL (ref 12.0–15.0)
MCH: 29.8 pg (ref 26.0–34.0)
MCHC: 32.3 g/dL (ref 30.0–36.0)
MCV: 92.3 fL (ref 80.0–100.0)
Platelets: 306 10*3/uL (ref 150–400)
RBC: 4.13 MIL/uL (ref 3.87–5.11)
RDW: 13.4 % (ref 11.5–15.5)
WBC: 9.7 10*3/uL (ref 4.0–10.5)
nRBC: 0 % (ref 0.0–0.2)

## 2021-07-18 LAB — LIPID PANEL
Cholesterol: 94 mg/dL (ref 0–200)
HDL: 21 mg/dL — ABNORMAL LOW (ref >40)
LDL Cholesterol: 57 mg/dL (ref 0–99)
Total CHOL/HDL Ratio: 4.5 RATIO
Triglycerides: 81 mg/dL (ref <150)
VLDL: 16 mg/dL (ref 0–40)

## 2021-07-18 LAB — HEMOGLOBIN A1C
Hgb A1c MFr Bld: 5 % (ref 4.8–5.6)
Mean Plasma Glucose: 96.8 mg/dL

## 2021-07-18 LAB — APTT: aPTT: >200 seconds (ref 24–36)

## 2021-07-18 MED ORDER — INFLUENZA VAC A&B SA ADJ QUAD 0.5 ML IM PRSY
0.5000 mL | PREFILLED_SYRINGE | INTRAMUSCULAR | Status: AC
  Administered 2021-07-19: 0.5 mL via INTRAMUSCULAR
  Filled 2021-07-18: qty 0.5

## 2021-07-18 MED ORDER — HEPARIN BOLUS VIA INFUSION
2000.0000 [IU] | Freq: Once | INTRAVENOUS | Status: AC
  Administered 2021-07-18: 2000 [IU] via INTRAVENOUS
  Filled 2021-07-18: qty 2000

## 2021-07-18 MED ORDER — ENSURE ENLIVE PO LIQD
237.0000 mL | Freq: Two times a day (BID) | ORAL | Status: DC
  Administered 2021-07-18 – 2021-07-25 (×10): 237 mL via ORAL
  Administered 2021-07-26: 237 via ORAL
  Administered 2021-07-26 – 2021-07-28 (×3): 237 mL via ORAL

## 2021-07-18 MED ORDER — SENNOSIDES-DOCUSATE SODIUM 8.6-50 MG PO TABS
1.0000 | ORAL_TABLET | Freq: Two times a day (BID) | ORAL | Status: DC
  Administered 2021-07-18 – 2021-07-28 (×16): 1 via ORAL
  Filled 2021-07-18 (×18): qty 1

## 2021-07-18 MED ORDER — POLYETHYLENE GLYCOL 3350 17 G PO PACK
17.0000 g | PACK | Freq: Every day | ORAL | Status: DC
  Administered 2021-07-18 – 2021-07-28 (×8): 17 g via ORAL
  Filled 2021-07-18 (×8): qty 1

## 2021-07-18 MED ORDER — NYSTATIN 100000 UNIT/GM EX CREA
TOPICAL_CREAM | Freq: Two times a day (BID) | CUTANEOUS | Status: DC
  Administered 2021-07-26: 1 via TOPICAL
  Filled 2021-07-18: qty 15

## 2021-07-18 MED ORDER — ENOXAPARIN SODIUM 40 MG/0.4ML IJ SOSY
40.0000 mg | PREFILLED_SYRINGE | INTRAMUSCULAR | Status: DC
  Administered 2021-07-18: 40 mg via SUBCUTANEOUS
  Filled 2021-07-18: qty 0.4

## 2021-07-18 MED ORDER — HEPARIN (PORCINE) 25000 UT/250ML-% IV SOLN
850.0000 [IU]/h | INTRAVENOUS | Status: AC
  Administered 2021-07-18: 850 [IU]/h via INTRAVENOUS
  Filled 2021-07-18 (×2): qty 250

## 2021-07-18 MED ORDER — LACTATED RINGERS IV SOLN: INTRAVENOUS | Status: AC

## 2021-07-18 NOTE — Evaluation (Signed)
Clinical/Bedside Swallow Evaluation Patient Details  Name: Mckenzie Guerrero MRN: [id number] Date of Birth: 14-May-1951  Today's Date: 07/18/2021 Time: SLP Start Time (ACUTE ONLY): 1053 SLP Stop Time (ACUTE ONLY): 1120 SLP Time Calculation (min) (ACUTE ONLY): 27 min  Past Medical History:  Past Medical History:  Diagnosis Date   Anxiety    Arthritis    osteoarthritis   Complication of anesthesia    GERD (gastroesophageal reflux disease)    Headache    migraines   History of bleeding ulcers 2010   History of hiatal hernia    Hypertension    Neuromuscular disorder (HCC)    tremors   Occasional tremors    PONV (postoperative nausea and vomiting)    Past Surgical History:  Past Surgical History:  Procedure Laterality Date   ANTERIOR CERVICAL DECOMP/DISCECTOMY FUSION N/A 09/28/2019   Procedure: Anterior Cervical Discectomy Fusion - Cervical Four-Cervical Five- Cervical Five-Cervical Six;  Surgeon: Earnie Larsson, MD;  Location: [facility];  Service: Neurosurgery;  Laterality: N/A;  Anterior Cervical Discectomy Fusion - Cervical Four-Cervical Five- Cervical Five-Cervical Six   benign tumor removal from left breast 2003  2003   BUNIONECTOMY Left    Great toe   COLONOSCOPY     ESOPHAGOGASTRODUODENOSCOPY N/A 05/25/2021   Procedure: ESOPHAGOGASTRODUODENOSCOPY (EGD);  Surgeon: Johnathan Hausen, MD;  Location: [facility];  Service: General;  Laterality: N/A;   ESOPHAGOGASTRODUODENOSCOPY  2022   w biopsy.  Dr Odie Sera in [place].  prior to [facility] surgery 05/2021.   XI ROBOTIC ASSISTED HIATAL HERNIA REPAIR N/A 05/25/2021   Procedure: XI ROBOTIC ASSISTED HIATAL HERNIA REPAIR WITH FUNDOPLICATION;  Surgeon: Johnathan Hausen, MD;  Location: [facility];  Service: General;  Laterality: N/A;   HPI:  Mckenzie Guerrero is a 71 yo female with HTN, Afib on Eliquis, and recent CVA (admitted 11/28-12/3; at [facility] from 12/3-12/20) who presented to the hospital after a fall.      The patient has non-intelligible speech at  baseline secondary to her recent CVA, so history was obtained from her son, [name], who was at the bedside. He states that this morning the patient fell while she was in her bathroom. He is unsure if she lost consciousness, but states that she called out to her boyfriend who helped her stand up after and brought her to the ED. The patient has had difficulties with balance and gait over the last few days. 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 She attempted to catch herself during the fall with her left arm and was found sitting on the ground by her boyfriend. The patient endorses significant pain in her left arm and is unable to move it now; 07/17/21 MRI brain indicated Although there are some conspicuous areas of definitive diffusion  restriction (adjacent to the left frontal horn series 2, image 31),  on close inspection with the 06/09/2021 DWI the parenchyma in that  same location seem to be restricted before. And I discussed with Dr.  Erlinda Hong that I have occasionally seen infarct cases with prolonged  diffusion restriction (sometimes a year or more) which I presume is  due to some type of viscous necrosis of the infarcted tissue.  Superimposed laminar necrosis and petechial hemorrhage also seen in  other areas of the chronic ischemia as reported by Dr. Lennon Alstrom.  SLE/BSE generated    Assessment / Plan / Recommendation  Clinical Impression  Pt seen for clinical  swallowing evaluation with cognitive-based dysphagia noted with slight oral holding prior to swallow, disorganized mastication/oral propulsion with mod verbal cues to clear oral cavity and initiate swallow with soft solids.  Pt with R lingual/labial oral weakness, but no anterior loss noted.  Pt did not demonstrate any s/sx of aspiration with any consistencies assessed using slow rate and smaller bites/sips during intake.  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  F/U ST recommended at next level of care d/t significant deficits.  Thank you for this consult. SLP Visit Diagnosis: Aphasia (R47.01);Dysphagia, oropharyngeal phase (R13.12)    Aspiration Risk  Mild aspiration risk    Diet Recommendation   Dysphagia 2/thin liquids  Medication Administration: Crushed with puree    Other  Recommendations Oral Care Recommendations: Oral care BID;Staff/trained caregiver to provide oral care    Recommendations for follow up therapy are one component of a multi-disciplinary discharge planning process, led by the attending physician.  Recommendations may be updated based on patient status, additional functional criteria and insurance authorization.  Follow up Recommendations Follow physician's recommendations for discharge plan and follow up therapies      Assistance Recommended at Discharge Frequent or constant Supervision/Assistance  Functional Status Assessment Patient has had a recent decline in their functional status and/or demonstrates limited ability to make significant improvements in function in a reasonable and predictable amount of time  Frequency and Duration min 2x/week  1 week       Prognosis Prognosis for Safe Diet Advancement: Good Barriers to Reach Goals: Severity of deficits      Swallow Study   General Date of Onset: 07/17/21 HPI: Mckenzie Guerrero is a 71 yo female with HTN, Afib on Eliquis, and recent CVA (admitted 11/28-12/3; at [facility] from 12/3-12/20) who presented to the hospital after a fall.      The patient has non-intelligible speech at baseline secondary to her recent CVA, so history was obtained from her son, [name], who was at the bedside. He states that this morning the patient fell while she was in her bathroom. He is unsure if she lost consciousness, but states that  she called out to her boyfriend who helped her stand up after and brought her to the ED. The patient has had difficulties with balance and gait over the last few days. 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 She attempted to catch herself during the fall with her left arm and was found sitting on the ground by her boyfriend. The patient endorses significant pain in her left arm and is unable to move it now; 07/17/21 MRI brain indicated Although there are some conspicuous areas of definitive diffusion  restriction (adjacent to the left frontal horn series 2, image 31),  on close inspection with the 06/09/2021 DWI the parenchyma in that  same location seem to be restricted before. And I discussed with Dr.  Erlinda Hong that I have occasionally seen infarct cases with prolonged  diffusion restriction (sometimes a year or more) which I presume is  due to some type of viscous necrosis of the infarcted tissue.  Superimposed laminar necrosis and petechial hemorrhage also seen in  other areas of the chronic ischemia as reported by Dr. Lennon Alstrom.  SLE/BSE generated Type of Study: Bedside Swallow Evaluation Previous Swallow  Assessment: Prior hospitalization through 06/30/21 recommended Dysphagia 1/thin liquids at d/c; MBS completed 06/11/21 Diet Prior to this Study: Regular;Thin liquids Temperature Spikes Noted: No Respiratory Status: Room air History of Recent Intubation: No Behavior/Cognition: Alert;Cooperative;Distractible;Requires cueing Oral Cavity Assessment: Within Functional Limits Oral Care Completed by SLP: Recent completion by staff Oral Cavity - Dentition: Dentures, top;Edentulous Vision: Functional for self-feeding Self-Feeding Abilities: Able to feed self;Needs assist Patient Positioning: Upright in bed Baseline Vocal Quality: Low vocal intensity Volitional Cough: Cognitively unable to elicit Volitional  Swallow: Unable to elicit    Oral/Motor/Sensory Function Overall Oral Motor/Sensory Function: Mild impairment Facial ROM: Reduced right Facial Symmetry: Abnormal symmetry right Facial Strength: Reduced right Facial Sensation: Reduced right Lingual ROM: Reduced right Lingual Symmetry: Abnormal symmetry right Lingual Strength: Within Functional Limits Lingual Sensation: Within Functional Limits Velum: Within Functional Limits Mandible: Within Functional Limits   Ice Chips Ice chips: Not tested   Thin Liquid Thin Liquid: Impaired Presentation: Straw Oral Phase Functional Implications: Oral holding Pharyngeal  Phase Impairments: Suspected delayed Swallow    Nectar Thick Nectar Thick Liquid: Not tested   Honey Thick Honey Thick Liquid: Not tested   Puree Puree: Within functional limits Presentation: Spoon;Self Fed Oral Phase Functional Implications: Oral holding Pharyngeal Phase Impairments: Suspected delayed Swallow   Solid     Solid: Impaired Presentation: Self Fed Oral Phase Impairments: Poor awareness of bolus;Impaired mastication Oral Phase Functional Implications: Prolonged oral transit;Impaired mastication Pharyngeal Phase Impairments: Suspected delayed Swallow      Elvina Sidle, M.S., CCC-SLP 07/18/2021,4:54 PM

## 2021-07-18 NOTE — Progress Notes (Signed)
ANTICOAGULATION CONSULT NOTE - Initial Consult  Pharmacy Consult for Heparin Indication: atrial fibrillation  Allergies  Allergen Reactions   Aspirin Other (See Comments)    unknown   Codeine Hives and Itching   Doxycycline Nausea And Vomiting   Nsaids Other (See Comments)    Gi bleeding   Sulfonamide Derivatives Swelling   Prednisone Anxiety    Patient Measurements: Height: 5\' 2"  (157.5 cm) Weight: 58.9 kg (129 lb 13.6 oz) IBW/kg (Calculated) : 50.1 Heparin Dosing Weight: 58.9 kg   Vital Signs: Temp: 98.3 F (36.8 C) (01/07 0657) Temp Source: Oral (01/07 0657) BP: 116/79 (01/07 0657) Pulse Rate: 96 (01/07 0657)  Labs: Recent Labs    07/17/21 1228 07/18/21 0223  HGB 12.4 12.3  HCT 38.3 38.1  PLT 309 306  CREATININE 0.72 0.79    Estimated Creatinine Clearance: 51.8 mL/min (by C-G formula based on SCr of 0.79 mg/dL).   Medical History: Past Medical History:  Diagnosis Date   Anxiety    Arthritis    osteoarthritis   Complication of anesthesia    GERD (gastroesophageal reflux disease)    Headache    migraines   History of bleeding ulcers 2010   History of hiatal hernia    Hypertension    Neuromuscular disorder (HCC)    tremors   Occasional tremors    PONV (postoperative nausea and vomiting)     Assessment: Patient presented to [facility] following a mechanical fall with left femoral neck fracture. Patient on apixaban PTA for atrial fibrillation (last dose 07/16/2021 at 2200). Patient is scheduled for surgery Monday 1/9 for ORIF. Neurosurgery evaluated the patient d/t recent AIS in 11/22 and has signed off on anticoagulation pending surgery. Pharmacy has been consulted for heparin dosing following.   CBC stable and WNL. As the patient has received a recent dose of apixaban, heparin will be titrated based on APTT until APTT and heparin levels are correlating. Will do a smaller bolus based on recent apixaban use.   Goal of Therapy:  Heparin level 0.3-0.7  units/ml Monitor platelets by anticoagulation protocol: Yes   Plan:  Heparin bolus 2,000 units x1  Heparin gtt at 850 units/h  F/U 8h HL/APTT Daily HL, CBC, APTT, monitor s/sx of bleeding   Adria Dill, PharmD PGY-1 Acute Care Resident  07/18/2021 1:50 PM

## 2021-07-18 NOTE — Progress Notes (Addendum)
STROKE TEAM PROGRESS NOTE   INTERVAL HISTORY Her RN is at the bedside.  Pt lying in bed, with left arm in splint. Plan for Monday orthopedic surgery. MRI brain showed extensive laminar necrosis but the DWI change does not feel to be new stroke, case discussed with Dr. Nevada Crane Neuroradiology. Will consider heparin IV in bridging with surgery.    OBJECTIVE Vitals:   07/17/21 2006 07/17/21 2155 07/18/21 0554 07/18/21 0657  BP: 111/80 125/78 (!) 125/49 116/79  Pulse: 73 (!) 103 63 96  Resp: 16 18 14 16   Temp:  98.6 F (37 C) 98.2 F (36.8 C) 98.3 F (36.8 C)  TempSrc:  Oral    SpO2: 99%  93% 96%  Weight:  58.9 kg    Height:  5\' 2"  (1.575 m)      CBC:  Recent Labs  Lab 07/17/21 1228 07/18/21 0223  WBC 11.4* 9.7  NEUTROABS 9.0*  --   HGB 12.4 12.3  HCT 38.3 38.1  MCV 93.0 92.3  PLT 309 662    Basic Metabolic Panel:  Recent Labs  Lab 07/17/21 1228 07/18/21 0223  NA 134* 136  K 3.7 3.6  CL 103 105  CO2 22 20*  GLUCOSE 118* 95  BUN 12 15  CREATININE 0.72 0.79  CALCIUM 8.8* 9.0    Lipid Panel:     Component Value Date/Time   CHOL 94 07/18/2021 0223   TRIG 81 07/18/2021 0223   HDL 21 (L) 07/18/2021 0223   CHOLHDL 4.5 07/18/2021 0223   VLDL 16 07/18/2021 0223   LDLCALC 57 07/18/2021 0223   HgbA1c:  Lab Results  Component Value Date   HGBA1C 5.0 07/18/2021   Urine Drug Screen: No results found for: LABOPIA, COCAINSCRNUR, LABBENZ, AMPHETMU, THCU, LABBARB  Alcohol Level No results found for: [facility]  IMAGING   DG Chest 1 View  Result Date: 07/17/2021 CLINICAL DATA:  Fall, pain EXAM: CHEST  1 VIEW COMPARISON:  06/08/2021 FINDINGS: Stable heart size and vascularity. Large hiatal hernia projects over the cardiac silhouette as before. Lungs remain clear. Negative for pneumonia, edema, effusion pneumothorax trachea midline. Aorta is sclerotic. Lower cervical fusion hardware noted. No acute osseous finding. IMPRESSION: Stable chest exam. Large hiatal hernia. Electronically  Signed   By: Jerilynn Mages.  Shick M.D.   On: 07/17/2021 11:44   DG Thoracic Spine 2 View  Result Date: 07/17/2021 CLINICAL DATA:  Fall, back pain, left humerus fracture EXAM: THORACIC SPINE 2 VIEWS COMPARISON:  07/17/2021 FINDINGS: Diffuse thoracic spine degenerative changes and mild S-shaped scoliosis. Slight increase kyphosis on the lateral view. lower cervical fusion hardware noted. No definite acute osseous finding or severe compression fracture. Normal paraspinous soft tissues. IMPRESSION: Diffuse thoracic spine degenerative changes and scoliosis. No acute finding by plain radiography. Electronically Signed   By: Jerilynn Mages.  Shick M.D.   On: 07/17/2021 11:53   DG Lumbar Spine Complete  Result Date: 07/17/2021 CLINICAL DATA:  Fall, pain, left humerus fracture EXAM: LUMBAR SPINE - COMPLETE 4+ VIEW COMPARISON:  07/17/2021 FINDINGS: Bones are osteopenic. Thoracolumbar scoliosis noted. No pars defects. Mild diffuse endplate degenerative changes. Preserved vertebral body heights without acute compression fracture or focal kyphosis. Lower lumbar facet arthropathy. Aorta is atherosclerotic. Normal SI joints for age. Nonobstructive bowel gas pattern. IMPRESSION: 1. Osteopenia and thoracolumbar scoliosis. 2. No acute finding by plain radiography. Electronically Signed   By: Jerilynn Mages.  Shick M.D.   On: 07/17/2021 11:55   DG Pelvis 1-2 Views  Result Date: 07/17/2021 CLINICAL DATA:  Fall, left  humerus fracture, pelvic pain EXAM: PELVIS - 1-2 VIEW COMPARISON:  07/17/2021 FINDINGS: Bones are osteopenic. Bony pelvis and hips appear symmetric and intact. Negative for fracture. Normal SI joints for age. No diastasis. Lower lumbar degenerative change present. IMPRESSION: Osteopenia. No acute finding by plain radiography. Electronically Signed   By: Jerilynn Mages.  Shick M.D.   On: 07/17/2021 11:55   DG Forearm Left  Result Date: 07/17/2021 CLINICAL DATA:  Fall, injury, left humerus fracture EXAM: LEFT FOREARM - 2 VIEW COMPARISON:  None. FINDINGS: There is  no evidence of fracture or other focal bone lesions. Soft tissues are unremarkable. Bones are osteopenic. IMPRESSION: No acute osseous finding. Osteopenia. Electronically Signed   By: Jerilynn Mages.  Shick M.D.   On: 07/17/2021 11:49   CT Head Wo Contrast  Result Date: 07/17/2021 CLINICAL DATA:  Head trauma EXAM: CT HEAD WITHOUT CONTRAST TECHNIQUE: Contiguous axial images were obtained from the base of the skull through the vertex without intravenous contrast. COMPARISON:  CT head 06/12/2021 FINDINGS: Brain: No acute intracranial hemorrhage, mass effect, or herniation. No extra-axial fluid collections. No evidence of acute territorial infarct. No hydrocephalus. Large areas of encephalomalacia again seen in the left MCA territory. Patchy hypodensities in the periventricular and subcortical white matter, likely secondary to chronic microvascular ischemic changes. Vascular: No hyperdense vessel or unexpected calcification. Skull: Normal. Negative for fracture or focal lesion. Sinuses/Orbits: Small air-fluid levels in the bilateral maxillary sinuses and sphenoid sinuses, right greater than left. Moderate mucosal thickening in the ethmoid sinuses. Other: None. IMPRESSION: 1. Chronic changes with no acute intracranial process identified. 2. Paranasal sinus disease with air-fluid levels as described, correlate for acute sinusitis. Electronically Signed   By: Ofilia Neas M.D.   On: 07/17/2021 11:40   CT Cervical Spine Wo Contrast  Result Date: 07/17/2021 CLINICAL DATA:  Trauma EXAM: CT CERVICAL SPINE WITHOUT CONTRAST TECHNIQUE: Multidetector CT imaging of the cervical spine was performed without intravenous contrast. Multiplanar CT image reconstructions were also generated. COMPARISON:  None. FINDINGS: Alignment: No subluxation Skull base and vertebrae: No acute fracture. No primary bone lesion or focal pathologic process. Anterior cervical fusion hardware and intervertebral disc spacers from C4 through C6, hardware appears  intact. Soft tissues and spinal canal: No prevertebral fluid or swelling. No visible canal hematoma. Disc levels:  No significant abnormality. Upper chest: Biapical pleural thickening noted. Other: None. IMPRESSION: No acute fracture or subluxation identified.  Postsurgical changes. Electronically Signed   By: Ofilia Neas M.D.   On: 07/17/2021 11:48   MR ANGIO HEAD WO CONTRAST  Result Date: 07/17/2021 CLINICAL DATA:  Stroke follow-up EXAM: MRA HEAD WITHOUT CONTRAST TECHNIQUE: Angiographic images of the Circle of Willis were acquired using MRA technique without intravenous contrast. COMPARISON:  No pertinent prior exam. FINDINGS: POSTERIOR CIRCULATION: --Vertebral arteries: Normal left V4 segment. No flow related enhancement within the proximal right V4 segment, which may be occluded. There is reconstitution of the distal right V4 segment, likely via collateral flow across the vertebrobasilar confluence. --Inferior cerebellar arteries: Normal. --Basilar artery: Normal. --Superior cerebellar arteries: Normal. --Posterior cerebral arteries: Normal. ANTERIOR CIRCULATION: --Intracranial internal carotid arteries: Normal. --Anterior cerebral arteries (ACA): Normal. --Middle cerebral arteries (MCA): Normal. ANATOMIC VARIANTS: None IMPRESSION: 1. No flow related enhancement within the proximal right vertebral artery, which may be occluded. 2. Otherwise normal intracranial MRA. Electronically Signed   By: Ulyses Jarred M.D.   On: 07/17/2021 23:13   MR Brain Wo Contrast (neuro protocol)  Result Date: 07/17/2021 CLINICAL DATA:  Fall, difficulty with balance, previous  CVA EXAM: MRI HEAD WITHOUT CONTRAST TECHNIQUE: Multiplanar, multiecho pulse sequences of the brain and surrounding structures were obtained without intravenous contrast. COMPARISON:  06/09/2021 FINDINGS: Brain: Within the previously noted area of infarct in the left MCA territory, there are new areas of restricted diffusion, predominantly along the  periphery of the infarct, in addition to small foci within the infarcted territory, which demonstrate increased signal on diffusion-weighted imaging and ADC correlates (series 2, image 20-34), consistent with additional acute infarcts. Hemosiderin deposition is noted within the infarcted territory, likely petechial hemorrhage, as well as volume loss and curvilinear intrinsic T1 hyperintense signal, consistent with encephalomalacia and laminar necrosis. No mass, mass effect, or midline shift. There is mild ex vacuo dilatation of the left lateral ventricle, secondary to left cerebral hemisphere encephalomalacia. No extra-axial collection. Vascular: Normal flow voids, with redemonstrated diminutive right V4 flow void. Skull and upper cervical spine: Normal marrow signal. Sinuses/Orbits: Layering fluid in the right-greater-than-left maxillary sinus and bilateral sphenoid sinuses, with mucosal thickening in the ethmoid air cells. The orbits are unremarkable. Other: The mastoids are well aerated. IMPRESSION: 1. Status post prior left MCA territory infarct, with new areas of restricted diffusion along the periphery of the infarct, as well as within the infarct, consistent with new acute infarcts. 2. Layering fluid in the maxillary and sphenoid sinuses, which can be seen in the setting of acute sinusitis. Correlate with symptoms. These results were called by telephone at the time of interpretation on 07/17/2021 at 7:46 pm to provider DR. BASARABA, who verbally acknowledged these results. Electronically Signed   By: Merilyn Baba M.D.   On: 07/17/2021 19:56   DG Shoulder Left  Result Date: 07/17/2021 CLINICAL DATA:  Fall, deformity, pain EXAM: LEFT SHOULDER - 2+ VIEW COMPARISON:  07/17/2021 FINDINGS: There is an acute minimally comminuted and displaced fracture of the left humerus midshaft. Alignment at the shoulder joint appears maintained. No subluxation or dislocation. Bones are osteopenic. AC joint also aligned.  IMPRESSION: Acute left humerus displaced midshaft fracture. Electronically Signed   By: Jerilynn Mages.  Shick M.D.   On: 07/17/2021 11:46   DG Humerus Left  Result Date: 07/17/2021 CLINICAL DATA:  Fall, pain and deformity EXAM: LEFT HUMERUS - 2+ VIEW COMPARISON:  07/17/2021 FINDINGS: There is an acute displaced and minimally comminuted left humerus midshaft fracture. Butterfly fragment noted. Bones are osteopenic. IMPRESSION: Acute left humerus midshaft fracture. Electronically Signed   By: Jerilynn Mages.  Shick M.D.   On: 07/17/2021 11:48   DG Hand Complete Left  Result Date: 07/17/2021 CLINICAL DATA:  Fall, deformity, left humerus fracture EXAM: LEFT HAND - COMPLETE 3+ VIEW COMPARISON:  07/17/2021 FINDINGS: Diffuse osteopenia. Degenerative changes of the wrist at the first [facility] joint with joint space loss and sclerosis. No acute osseous finding or fracture. No other joint abnormality. IMPRESSION: Osteopenia and osteoarthritis. No acute osseous finding. Electronically Signed   By: Jerilynn Mages.  Shick M.D.   On: 07/17/2021 11:52     Transthoracic Echocardiogram  06/09/2021 IMPRESSIONS   1. Left ventricular ejection fraction, by estimation, is 60 to 65%. The  left ventricle has normal function. The left ventricle has no regional  wall motion abnormalities. Left ventricular diastolic parameters are  consistent with Grade I diastolic  dysfunction (impaired relaxation).   2. Right ventricular systolic function is normal. The right ventricular  size is normal. There is normal pulmonary artery systolic pressure. The  estimated right ventricular systolic pressure is 16.6 mmHg.   3. Left atrial size was mildly dilated.   4.  Right atrial size was mildly dilated.   5. The mitral valve is grossly normal. Trivial mitral valve  regurgitation. No evidence of mitral stenosis.   6. The aortic valve is tricuspid. Aortic valve regurgitation is trivial.  No aortic stenosis is present.   7. There is moderate dilatation of the ascending aorta,  measuring 43 mm.   8. The inferior vena cava is normal in size with greater than 50%  respiratory variability, suggesting right atrial pressure of 3 mmHg.    PHYSICAL EXAM  Temp:  [98.2 F (36.8 C)-98.6 F (37 C)] 98.3 F (36.8 C) (01/07 0657) Pulse Rate:  [63-103] 96 (01/07 0657) Resp:  [13-18] 16 (01/07 0657) BP: (107-125)/(49-80) 116/79 (01/07 0657) SpO2:  [93 %-99 %] 96 % (01/07 0657) Weight:  [58.9 kg] 58.9 kg (01/06 2155)  General - Well nourished, well developed, in no apparent distress.  Ophthalmologic - fundi not visualized due to noncooperation.  Cardiovascular - irregularly irregular heart rate and rhythm.  Neuro - awake, alert, eyes open, global aphasia, following all simple commands except eye closure. Not able to name and repeat. No gaze palsy, tracking bilaterally, blinking to visual threat bilaterally, PERRL. No facial droop. Tongue protrusion not cooperative. RUE 5/5, no drift. Bilaterally LEs 5/5, no drift. LUE in splint, did not test strength given farcture. Sensation, coordination not cooperative and gait not tested.   ASSESSMENT/PLAN [name] is a 71 y.o. 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  She did not receive IV t-PA due to Eliquis therapy.  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  MRI head - Status post prior left MCA territory infarct, with significant laminar necrosis and prolonged DWI changes, discussed with Dr. Nevada Crane neuroradiologist, do not feel new ischemic stroke at this time. MRA head - No flow  related enhancement within the proximal right vertebral artery, which may be occluded. Otherwise normal intracranial MRA. Carotid Doppler - pending 2D Echo - 06/09/2021 - EF 60 - 65%. No cardiac source of emboli identified.  Sars Corona Virus 2 - positive LDL - 57 HgbA1c - 5.0 VTE prophylaxis - Lovenox Eliquis (apixaban) daily prior to admission, now on aspirin 81 mg daily (Eliquis on hold pending surgery).  Recommend heparin IV per stroke protocol in bridging to surgery.  Aspirin can be discontinued. Ongoing aggressive stroke risk factor management Therapy recommendations:  pending Disposition:  Pending  History of stroke 05/2021 admitted for right-sided weakness right facial droop, left gaze and aphasia.  CT showed left MCA infarct.  CTA head and neck no LVO but right VA occlusion.  CT perfusion 42/56.  No DVT.  MRI showed left MCA large infarct with hemorrhagic transformation.  EF 60 to 65%, LDL 139, A1c 5.8.  Patient was found to have new onset A. fib.  Initial aspirin but switch to Eliquis during inpatient rehab.  Fall at home Not sure the cause but could be related to COVID infection X-ray showed left femoral fracture Orthopedics on board Plan for ORIF on Monday  COVID infection Positive COVID PCR On airborne isolation  Management per primary team  PAF Found during last admission 05/2021 On Eliquis at home Recommend heparin IV in bridge to surgery on Monday Rate controlled  Hypertension Home BP meds: Toprol XL 25 mg daily Stable Long-term BP goal normotensive  Hyperlipidemia Home Lipid lowering medication: Lipitor 40 mg daily  LDL 57, goal < 70 Current lipid lowering medication: Lipitor 40 mg daily  Continue statin at discharge  Other Stroke Risk Factors Advanced age Former cigarette smoker - quit Migraines  Other Active Problems, Findings, Recommendations and/or Plan Code status - Full code Aortic Atherosclerosis (ICD10-I70.0).  Hospital day # 1  Neurology  will sign off. Please call with questions. Pt will follow up with stroke clinic NP at [facility] on 08/31/2021.  Thanks for the consult.  Rosalin Hawking, MD PhD Stroke Neurology 07/18/2021 1:30 PM  I spent  40 minutes in total face-to-face time with the patient, more than 50% of which was spent in counseling and coordination of care, reviewing test results, images and medication, and discussing the diagnosis, treatment plan and potential prognosis. This patient's care requiresreview of multiple databases, neurological assessment, discussion with family, other specialists and medical decision making of high complexity.  I discussed with Dr. Nevada Crane radiology and primary team over the phone.     To contact Stroke Continuity provider, please refer to [email or web address] After hours, contact General Neurology

## 2021-07-18 NOTE — Progress Notes (Signed)
Initial Nutrition Assessment  DOCUMENTATION CODES:   Not applicable  INTERVENTION:  Provide Ensure Enlive po BID, each supplement provides 350 kcal and 20 grams of protein  Encourage adequate PO intake.   NUTRITION DIAGNOSIS:   Increased nutrient needs related to catabolic illness (COVID) as evidenced by estimated needs.  GOAL:   Patient will meet greater than or equal to 90% of their needs  MONITOR:   PO intake, Supplement acceptance, Skin, Weight trends, Labs, I & O's  REASON FOR ASSESSMENT:   Consult Assessment of nutrition requirement/status  ASSESSMENT:   71 yo female with HTN, Afib on Eliquis, and recent CVA (admitted 11/28-12/3; at [facility] from 12/3-12/20) who presented to the hospital after a fall. Pt with left midshaft humerus fracture. Workup with MRI Brain without contrast which demonstrated new areas of acute stroke in the left MCA territory along with her prior stroke from Nov 2022. She is also positive for COVID 19.  Pt with aphasia. RD unable to obtain pt nutrition history at this time. Plans for ORIF on Monday. RD to order nutritional supplements to aid in caloric and protein needs. Unable to complete Nutrition-Focused physical exam at this time.   Labs and medications reviewed.   Diet Order:   Diet Order             Diet Heart Room service appropriate? Yes; Fluid consistency: Thin  Diet effective now                   EDUCATION NEEDS:   Not appropriate for education at this time  Skin:  Skin Assessment: Reviewed RN Assessment  Last BM:  Unknown  Height:   Ht Readings from Last 1 Encounters:  07/17/21 5\' 2"  (1.575 m)    Weight:   Wt Readings from Last 1 Encounters:  07/17/21 58.9 kg   BMI:  Body mass index is 23.75 kg/m.  Estimated Nutritional Needs:   Kcal:  1700-1900  Protein:  80-90 grams  Fluid:  >/= 1.7 L/day  Corrin Parker, MS, RD, LDN RD pager number/after hours weekend pager number on Amion.

## 2021-07-18 NOTE — Evaluation (Signed)
Physical Therapy Evaluation Patient Details Name: Mckenzie Guerrero MRN: [id number] DOB: [birth year] Today's Date: 07/18/2021  History of Present Illness  71 y.o. female s/p fall and admission 1/6 with L humerus fx has plan for ORIF Monday 1/9. MRI + new areas of acute CVA in L MCA territory. + for COVID.  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  Clinical Impression  Pt was seen for initial evaluation of her mobility after sustaining an unwitnessed fall in the bathroom post CVA with R hemiparesis.  Pt's significant other was available, supportive but also not going to be able to assist with just one person.  Pt was finally willing to release RUE from LUE to scoot and stand partially with a gait belt, but is not safe to move without dense cues for sequence.  Plan is to send her to SNF due to her inability to move well to stand, non ambulatory status and previous independence with gait that bode well for progress.  Given pain to move, hoping the surgery first of the week to correct instability on LUE will give her pain management to try other AD's and work on more dependable and safe gait.  Follow for acute PT goals as are outlined below.       Recommendations for follow up therapy are one component of a multi-disciplinary discharge planning process, led by the attending physician.  Recommendations may be updated based on patient status, additional functional criteria and insurance authorization.  Follow Up Recommendations Skilled nursing-short term rehab (<3 hours/day)    Assistance Recommended at Discharge Frequent or constant Supervision/Assistance  Patient can return home with the following  A lot of help with walking and/or transfers;A little help with bathing/dressing/bathroom;Assistance with cooking/housework;Assist for transportation;Help with stairs or ramp for entrance    Equipment  Recommendations None recommended by PT  Recommendations for Other Services       Functional Status Assessment Patient has had a recent decline in their functional status and demonstrates the ability to make significant improvements in function in a reasonable and predictable amount of time.     Precautions / Restrictions Precautions Precautions: Fall Precaution Comments: Expressive > receptive aphasia, R inattention Required Braces or Orthoses: Sling Restrictions Weight Bearing Restrictions: Yes LUE Weight Bearing: Non weight bearing      Mobility  Bed Mobility Overal bed mobility: Needs Assistance Bed Mobility: Supine to Sit;Sit to Supine     Supine to sit: Min assist;+2 for physical assistance;+2 for safety/equipment Sit to supine: Min assist   General bed mobility comments: pt was assisted to sit on side of bed and then back after standing and scooting on bedside were tested    Transfers Overall transfer level: Needs assistance Equipment used: 1 person hand held assist Transfers: Sit to/from Stand Sit to Stand: Mod assist           General transfer comment: pt was able to assist finally with gait belt and cues for supporting with legs to stand    Ambulation/Gait               General Gait Details: unable due to pain on LUE  Stairs            Wheelchair Mobility    Modified Rankin (Stroke Patients Only)       Balance Overall balance assessment: History of Falls;Needs assistance   Sitting balance-Leahy Scale: Good Sitting balance -  Comments: good with no UE support     Standing balance-Leahy Scale: Poor Standing balance comment: poor due to fear of hurting LUE                             Pertinent Vitals/Pain Pain Assessment: Faces Faces Pain Scale: Hurts whole lot Breathing: normal Negative Vocalization: occasional moan/groan, low speech, negative/disapproving quality Facial Expression: sad, frightened, frown Body  Language: tense, distressed pacing, fidgeting Consolability: distracted or reassured by voice/touch PAINAD Score: 4 Facial Expression: Tense Body Movements: Protection Muscle Tension: Tense, rigid Pain Location: LUE Pain Descriptors / Indicators: Guarding;Grimacing;Moaning Pain Intervention(s): Limited activity within patient's tolerance;Monitored during session;Repositioned    Home Living Family/patient expects to be discharged to:: Private residence Living Arrangements: Children Available Help at Discharge: Family;Friend(s);Available PRN/intermittently Type of Home: House Home Access: Stairs to enter Entrance Stairs-Rails: None Entrance Stairs-Number of Steps: 1 Alternate Level Stairs-Number of Steps: flight Home Layout: Two level;Multi-level Home Equipment: None      Prior Function Prior Level of Function : Needs assist  Cognitive Assist : Mobility (cognitive) Mobility (Cognitive): Intermittent cues   Physical Assist : Mobility (physical) Mobility (physical): Stairs   Mobility Comments: was walking with no AD but was beginning to be unsafe per significant other ADLs Comments: pt had begun to require help to do self care     Hand Dominance   Dominant Hand: Right    Extremity/Trunk Assessment   Upper Extremity Assessment Upper Extremity Assessment: Defer to OT evaluation    Lower Extremity Assessment Lower Extremity Assessment: RLE deficits/detail RLE Deficits / Details: mild weakness on RLE predating her fall from CVA RLE Coordination: decreased gross motor    Cervical / Trunk Assessment Cervical / Trunk Assessment: Normal  Communication   Communication: Expressive difficulties;Receptive difficulties  Cognition Arousal/Alertness: Awake/alert Behavior During Therapy: Restless;Anxious Overall Cognitive Status: Difficult to assess                                 General Comments: pt is reluctant to let PT move her but finally was compliant with  gait belt to scoot up the bed Functional Status Assessment: Patient has had a recent decline in their functional status and/or demonstrates limited ability to make significant improvements in function in a reasonable and predictable amount of time      General Comments General comments (skin integrity, edema, etc.): pt was seen for standing and scooting efforts, but reluctant to let go of LUE with RUE to see how she moves    Exercises     Assessment/Plan    PT Assessment Patient needs continued PT services  PT Problem List Decreased strength;Decreased range of motion;Decreased activity tolerance;Decreased balance;Decreased mobility;Decreased coordination;Decreased skin integrity;Decreased knowledge of precautions       PT Treatment Interventions DME instruction;Gait training;Functional mobility training;Therapeutic activities;Therapeutic exercise;Balance training;Neuromuscular re-education;Patient/family education    PT Goals (Current goals can be found in the Care Plan section)  Acute Rehab PT Goals Patient Stated Goal: unable to state PT Goal Formulation: With family Time For Goal Achievement: 08/01/21 Potential to Achieve Goals: Fair    Frequency Min 3X/week     Co-evaluation               AM-PAC PT "6 Clicks" Mobility  Outcome Measure Help needed turning from your back to your side while in a flat bed without using bedrails?: A Little Help  needed moving from lying on your back to sitting on the side of a flat bed without using bedrails?: A Little Help needed moving to and from a bed to a chair (including a wheelchair)?: Total Help needed standing up from a chair using your arms (e.g., wheelchair or bedside chair)?: A Lot Help needed to walk in hospital room?: Total Help needed climbing 3-5 steps with a railing? : Total 6 Click Score: 11    End of Session Equipment Utilized During Treatment: Gait belt Activity Tolerance: Patient limited by fatigue;Patient limited  by pain Patient left: in bed;with call bell/phone within reach;with bed alarm set;with family/visitor present Nurse Communication: Mobility status PT Visit Diagnosis: Unsteadiness on feet (R26.81);Muscle weakness (generalized) (M62.81);Pain Pain - Right/Left: Left Pain - part of body: Arm    Time: 1455-1516 PT Time Calculation (min) (ACUTE ONLY): 21 min   Charges:   PT Evaluation $PT Eval Moderate Complexity: 1 Mod         Ramond Dial 07/18/2021, 5:54 PM  Mee Hives, PT PhD Acute Rehab Dept. Number: [place] [facility] [place]

## 2021-07-18 NOTE — Evaluation (Signed)
Occupational Therapy Evaluation Patient Details Name: Mckenzie Guerrero MRN: [id number] DOB: 1950/11/30 Today's Date: 07/18/2021   History of Present Illness 71 y.o. female s/p fall with L humerus fx with pan for ORIF Monday 1/9. MRI + new areas of acute CVA in L MCA territory. + for COVID.  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   Clinical Impression   PTA pt had recently discharged home form [facility] and family provided assistance with ADL/IADL tasks due to cognitive and language deficits. Son reports his Mom was staying with her boyfriend at the time of the fall and reports she had only recently had more difficulty with mobility. Son states his Mom was having more frequent episodes of incontinence with BMs. Pt was able to mobilize to the EOB, however starting saying "no no no no" when attempting to stand. Unsure if due to pain or need to urinate. Son states "we can't do this anymore..we need to put her in a facility". Will follow up after surgery to facilitate DC to SNF for rehab     Recommendations for follow up therapy are one component of a multi-disciplinary discharge planning process, led by the attending physician.  Recommendations may be updated based on patient status, additional functional criteria and insurance authorization.   Follow Up Recommendations  Skilled nursing-short term rehab (<3 hours/day)    Assistance Recommended at Discharge Frequent or constant Supervision/Assistance  Patient can return home with the following A little help with walking and/or transfers;A lot of help with bathing/dressing/bathroom;Assistance with feeding;Assistance with cooking/housework;Direct supervision/assist for medications management;Direct supervision/assist for financial management;Assist for transportation;Help with stairs or ramp for entrance    Functional Status Assessment  Patient has  had a recent decline in their functional status and demonstrates the ability to make significant improvements in function in a reasonable and predictable amount of time.  Equipment Recommendations  None recommended by OT    Recommendations for Other Services PT consult;Speech consult     Precautions / Restrictions Precautions Precautions: Fall Precaution Comments: Expressive > receptive aphasia, R inattention      Mobility Bed Mobility Overal bed mobility: Needs Assistance Bed Mobility: Supine to Sit     Supine to sit: Min assist          Transfers                   General transfer comment: unable to stand on attmept; pt stating "no no no no no"      Balance Overall balance assessment: History of Falls;Needs assistance   Sitting balance-Leahy Scale: Good                                     ADL either performed or assessed with clinical judgement   ADL Overall ADL's : Needs assistance/impaired Eating/Feeding: Minimal assistance   Grooming: Moderate assistance   Upper Body Bathing: Maximal assistance   Lower Body Bathing: Maximal assistance   Upper Body Dressing : Maximal assistance   Lower Body Dressing: Maximal assistance     Toilet Transfer Details (indicate cue type and reason): unablet o attempt         Functional mobility during ADLs: Minimal assistance (to get to EOB) General ADL Comments: Limited by pain     Vision   Additional Comments: Will further assess; Decreased attention  to R     Perception     Praxis Praxis Praxis-Other Comments: difficulty with motor planning noted with unfamiliar tasks    Pertinent Vitals/Pain Pain Assessment: Faces Faces Pain Scale: Hurts whole lot Pain Location: LUE Pain Descriptors / Indicators: Crying;Grimacing;Guarding;Moaning Pain Intervention(s): Limited activity within patient's tolerance;Repositioned     Hand Dominance Right   Extremity/Trunk Assessment Upper Extremity  Assessment Upper Extremity Assessment: LUE deficits/detail LUE Deficits / Details: L humerus fx; ORIF scheduled for 1/9; hand/wrist ROM WFL; elbow not assessed LUE Coordination: decreased gross motor   Lower Extremity Assessment Lower Extremity Assessment: Defer to PT evaluation   Cervical / Trunk Assessment Cervical / Trunk Assessment: Normal   Communication Communication Communication: Expressive difficulties;Receptive difficulties   Cognition Arousal/Alertness: Awake/alert Behavior During Therapy: Restless Overall Cognitive Status: Difficult to assess                                 General Comments: inconsistently following 1 step commands; used cup/straw appropriately     General Comments       Exercises     Shoulder Instructions      Home Living Family/patient expects to be discharged to:: Private residence Living Arrangements: Children Available Help at Discharge: Family;Available PRN/intermittently Type of Home: House Home Access: Stairs to enter [employer] of Steps: 1 Entrance Stairs-Rails: None Home Layout: Two level;Multi-level (split level) Alternate Level Stairs-Number of Steps: flight Alternate Level Stairs-Rails: Right Bathroom Shower/Tub: Occupational psychologist: Standard Bathroom Accessibility: No   Home Equipment: None          Prior Functioning/Environment Prior Level of Function : Needs assist  Cognitive Assist : ADLs (cognitive)   ADLs (Cognitive): Step by step cues         ADLs Comments: Family was assisting with ADL tasks; Could dress herself but family assisted with bathing for cognitive assist; family reports incontinence at times        OT Problem List: Decreased strength;Decreased range of motion;Decreased activity tolerance;Decreased coordination;Decreased cognition;Decreased safety awareness;Impaired UE functional use;Pain;Increased edema      OT Treatment/Interventions: Self-care/ADL  training;Therapeutic exercise;Neuromuscular education;DME and/or AE instruction;Therapeutic activities;Patient/family education;Balance training    OT Goals(Current goals can be found in the care plan section) Acute Rehab OT Goals Patient Stated Goal: per son for his Mom to go to a facility OT Goal Formulation: With family Time For Goal Achievement: 08/01/21 Potential to Achieve Goals: Good  OT Frequency: Min 2X/week    Co-evaluation              AM-PAC OT "6 Clicks" Daily Activity     Outcome Measure Help from another person eating meals?: A Little Help from another person taking care of personal grooming?: A Lot Help from another person toileting, which includes using toliet, bedpan, or urinal?: A Lot Help from another person bathing (including washing, rinsing, drying)?: A Lot Help from another person to put on and taking off regular upper body clothing?: A Lot Help from another person to put on and taking off regular lower body clothing?: A Lot 6 Click Score: 13   End of Session Nurse Communication: Mobility status;Precautions;Weight bearing status  Activity Tolerance: Patient tolerated treatment well Patient left: in bed;with call bell/phone within reach;with bed alarm set  OT Visit Diagnosis: Unsteadiness on feet (R26.81);Muscle weakness (generalized) (M62.81);History of falling (Z91.81);Apraxia (R48.2);Other symptoms and signs involving cognitive function;Other symptoms and signs involving the nervous system (R29.898);Cognitive  communication deficit (R41.841);Pain Pain - Right/Left: Left Pain - part of body: Shoulder                Time: 1050-1110 OT Time Calculation (min): 20 min Charges:  OT General Charges $OT Visit: 1 Visit OT Evaluation $OT Eval Moderate Complexity: [place], OT/L   Acute OT Clinical Specialist [place] Pager [phone] Office [phone]   [facility] 07/18/2021, 11:47 AM

## 2021-07-18 NOTE — Evaluation (Signed)
Speech Language Pathology Evaluation Patient Details Name: Mckenzie Guerrero MRN: [id number] DOB: [birth year] Today's Date: 07/18/2021 Time: 2297-9892 SLP Time Calculation (min) (ACUTE ONLY): 27 min  Problem List:  Patient Active Problem List   Diagnosis Date Noted   Humerus fracture 07/17/2021   Constipation 06/30/2021   Combined receptive and expressive aphasia 06/30/2021   Dysphagia, post-stroke    Acute ischemic left middle cerebral artery (MCA) stroke ([place]) 06/13/2021   Acute ischemic stroke [facility])    Urinary retention    Right hemiparesis (HCC)    AF (paroxysmal atrial fibrillation) ([place]) 06/11/2021   Hyperlipidemia, unspecified 06/10/2021   History of migraine 06/10/2021   Thrombocytosis 06/10/2021   History of repair of hiatal hernia 06/10/2021   Acute CVA (cerebrovascular accident) ([place]) 06/08/2021   HTN (hypertension) 06/08/2021   Leukocytosis 06/08/2021   Status post laparoscopic Nissen fundoplication 11/94/1740   Cervical spondylosis with myelopathy and radiculopathy 09/28/2019   Past Medical History:  Past Medical History:  Diagnosis Date   Anxiety    Arthritis    osteoarthritis   Complication of anesthesia    GERD (gastroesophageal reflux disease)    Headache    migraines   History of bleeding ulcers 2010   History of hiatal hernia    Hypertension    Neuromuscular disorder (HCC)    tremors   Occasional tremors    PONV (postoperative nausea and vomiting)    Past Surgical History:  Past Surgical History:  Procedure Laterality Date   ANTERIOR CERVICAL DECOMP/DISCECTOMY FUSION N/A 09/28/2019   Procedure: Anterior Cervical Discectomy Fusion - Cervical Four-Cervical Five- Cervical Five-Cervical Six;  Surgeon: Earnie Larsson, MD;  Location: [place];  Service: Neurosurgery;  Laterality: N/A;  Anterior Cervical Discectomy Fusion - Cervical Four-Cervical Five- Cervical Five-Cervical Six   benign tumor removal from left breast 2003  2003   BUNIONECTOMY Left    Great toe    COLONOSCOPY     ESOPHAGOGASTRODUODENOSCOPY N/A 05/25/2021   Procedure: ESOPHAGOGASTRODUODENOSCOPY (EGD);  Surgeon: Johnathan Hausen, MD;  Location: [facility];  Service: General;  Laterality: N/A;   ESOPHAGOGASTRODUODENOSCOPY  2022   w biopsy.  Dr Odie Sera in [place].  prior to [facility] surgery 05/2021.   XI ROBOTIC ASSISTED HIATAL HERNIA REPAIR N/A 05/25/2021   Procedure: XI ROBOTIC ASSISTED HIATAL HERNIA REPAIR WITH FUNDOPLICATION;  Surgeon: Johnathan Hausen, MD;  Location: [facility];  Service: General;  Laterality: N/A;   HPI:  Mckenzie Guerrero is a 71 yo female with HTN, Afib on Eliquis, and recent CVA (admitted 11/28-12/3; at [facility] from 12/3-12/20) who presented to the hospital after a fall.      The patient has non-intelligible speech at baseline secondary to her recent CVA, so history was obtained from her son, [name], who was at the bedside. He states that this morning the patient fell while she was in her bathroom. He is unsure if she lost consciousness, but states that she called out to her boyfriend who helped her stand up after and brought her to the ED. The patient has had difficulties with balance and gait over the last few days. Of note, she was recently discharged from [facility] on 12/20 following rehabilitation for a left MCA infarction that left her with right sided deficits. She attempted to catch herself during the fall with her left arm and was found sitting on the ground by her boyfriend. The patient endorses significant pain in her left arm and is unable to move it now; 07/17/21 MRI brain indicated Although there are some  conspicuous areas of definitive diffusion  restriction (adjacent to the left frontal horn series 2, image 31),  on close inspection with the 06/09/2021 DWI the parenchyma in that  same location seem to be restricted before. And I discussed with Dr.  Erlinda Hong that I have occasionally seen infarct cases with prolonged  diffusion restriction (sometimes a year or more) which I presume is  due to  some type of viscous necrosis of the infarcted tissue.  Superimposed laminar necrosis and petechial hemorrhage also seen in  other areas of the chronic ischemia as reported by Dr. Lennon Alstrom.  SLE/BSE generated   Assessment / Plan / Recommendation Clinical Impression  Pt seen for a speech/language evaluation with global aphasia persisting from prior admission with L MCA infarct with possible extension.  Pt demonstrated inconsistency following 1-2 step commands without max multimodal cues provided.  Pt able to verbalize a few words during assessment such as "okay" which she would use perseverately during interactions.  Pt stated "kay" when given sentence completion cues for name and used "no" intermittently during session when in pain and asked to move her left arm.  She also used automatic responses such as "bye, thank you" when speaking to OT at end of the session and waved at SLP at end of assessment.   Motor planning impacting overall performance with speech and simple functional tasks.  Cognition unable to be assessed d/t severe global aphasia.  Pt encouraged to use gestures and/or a form of augmementative communication to communicate with others with min success.  OME revealed R labial/lingual weakness overall, but some oral tasks unable to be completed d/t concern for oral apraxia.  ST will f/u for dysphagia and speech/language tx while in acute setting.  Thank you for this consult.    SLP Assessment  SLP Recommendation/Assessment: Patient needs continued Speech Language Pathology Services SLP Visit Diagnosis: Aphasia (R47.01)    Recommendations for follow up therapy are one component of a multi-disciplinary discharge planning process, led by the attending physician.  Recommendations may be updated based on patient status, additional functional criteria and insurance authorization.    Follow Up Recommendations  Follow physician's recommendations for discharge plan and follow up therapies     Assistance Recommended at Discharge  Frequent or constant Supervision/Assistance  Functional Status Assessment Patient has had a recent decline in their functional status and/or demonstrates limited ability to make significant improvements in function in a reasonable and predictable amount of time  Frequency and Duration min 2x/week  1 week      SLP Evaluation Cognition  Overall Cognitive Status: Difficult to assess Arousal/Alertness: Awake/alert Orientation Level: Oriented to person;Disoriented to place;Disoriented to time;Disoriented to situation       Comprehension  Auditory Comprehension Overall Auditory Comprehension: Impaired Yes/No Questions: Impaired Basic Biographical Questions: 0-25% accurate Basic Immediate Environment Questions: 0-24% accurate Commands: Impaired One Step Basic Commands: 50-74% accurate Two Step Basic Commands: 0-24% accurate Conversation: Other (comment) (unable to assess) Interfering Components: Other (comment) (global aphasia) EffectiveTechniques: Visual/Gestural cues;Repetition Visual Recognition/Discrimination Discrimination: Not tested Reading Comprehension Reading Status: Not tested Word level: Impaired Interfering Components: Other (comment) (global aphasia)    Expression Expression Primary Mode of Expression: Nonverbal - gestures Verbal Expression Overall Verbal Expression: Impaired Initiation: Impaired Automatic Speech: Name Level of Generative/Spontaneous Verbalization: Word Repetition: Impaired Level of Impairment: Word level Naming: Impairment Responsive: 0-25% accurate Confrontation: Impaired Convergent: 0-24% accurate Divergent: 0-24% accurate Verbal Errors: Perseveration Pragmatics: Unable to assess Non-Verbal Means of Communication: Other (comment) (DTA) Written Expression Dominant  Hand: Right Written Expression: Not tested   Oral / Motor  Oral Motor/Sensory Function Overall Oral Motor/Sensory Function: Mild  impairment Facial ROM: Reduced right Facial Symmetry: Abnormal symmetry right Facial Strength: Reduced right Facial Sensation: Reduced right Lingual ROM: Reduced right Lingual Symmetry: Abnormal symmetry right Lingual Strength: Within Functional Limits Lingual Sensation: Within Functional Limits Velum: Within Functional Limits Mandible: Within Functional Limits Motor Speech Overall Motor Speech: Impaired Respiration: Within functional limits Articulation:  (DTA) Intelligibility: Not tested Motor Planning: Impaired Level of Impairment: Word Motor Speech Errors: Groping for words;Inconsistent            Elvina Sidle, M.S., CCC-SLP 07/18/2021, 4:33 PM

## 2021-07-18 NOTE — Plan of Care (Signed)

## 2021-07-18 NOTE — Progress Notes (Signed)
HD#1 SUBJECTIVE:  Patient Summary: Mckenzie Guerrero is a 71 y.o. with a pertinent PMH of HTN, Afib on Eliquis, and recent CVA admitted from 11/28-12/3, who presented with a fall and admitted for acute L MCA infarct, COVID-19, and left femoral neck fracture.   Overnight Events: MRI brain resulted overnight which showed new areas of infarction in L MCA territory   Interim History: This is hospital day 1 for Mckenzie Guerrero who was seen and evaluated with her son at the bedside. He states that she appears more comfortable to him today. She did have an episode of severe pain in her left arm this morning, which improved with pain medications.    OBJECTIVE:  Vital Signs: Vitals:   07/17/21 1800 07/17/21 2006 07/17/21 2155 07/18/21 0554  BP: (!) 111/59 111/80 125/78 (!) 125/49  Pulse: 82 73 (!) 103 63  Resp: 13 16 18 14   Temp:   98.6 F (37 C) 98.2 F (36.8 C)  TempSrc:   Oral   SpO2: 96% 99%  93%  Weight:   58.9 kg   Height:   5\' 2"  (1.575 m)    Supplemental O2: Room Air SpO2: 93 % O2 Flow Rate (L/min): 0 L/min  Filed Weights   07/17/21 2155  Weight: 58.9 kg    No intake or output data in the 24 hours ending 07/18/21 0644 Net IO Since Admission: No IO data has been entered for this period [07/18/21 0644]  Physical Exam: General: No acute distress, but does appear to be in mild pain. CV: RRR. No murmurs, rubs, or gallops. No LE edema Pulmonary: Lungs CTAB. Normal effort. Abdominal: Soft, nontender, nondistended. Normal bowel sounds. Extremities: LUE adducted and flexed at elbow, in a sling and wrapped. Not able to wiggle L fingers.  Skin: Warm and dry.  Neuro: A&Ox3. Moves all extremities. Normal sensation. No focal deficit. Psych: Normal mood and affect    ASSESSMENT/PLAN:  Assessment: Principal Problem:   Humerus fracture   Plan: #Acute left MCA infarction Patient admitted on 11/29 with left MCA infarct with evidence of hemorrhagic transformation and trace right  midline shift. Patient admitted to CIR from 12/3 to 12/20 and was discharged to home after. Yesterday, patient sustained an unwitnessed fall and MRI brain upon admission showed new area of left MCA infarction. LDL improved to 57, A1c 5.0. Likely this patient sustained an embolic infarction, in the setting of eliquis failure.  - Neuro consulted, appreciate recs - Hold anticoagulation for a few days - Permissive HTN - Continue lipitor 40 mg  - PT/OT as able - SNF at discharge likely   #Left midshaft humerus fracture Patient fell and attempted to catch herself with her left upper extremity, sustaining a left midshaft humeral fracture. Arm is wrapped and remains in a sling.  - ORIF on Monday with ortho - NWB LUE - Pain mgmt: scheduled norco 5-325 mg q4h and prn dilaudid 0.5mg  q4h prn   #Paroxysmal Afib  #Hypertension  Patient in normal sinus rhythm on evaluation. On eliquis 5 mg bid and metoprolol 12.5 mg daily at home. Holding metoprolol in the setting of permissive hypertension and holding eliquis to avoid risk of hemorrhagic transformation of left MCA infarction.  - Resume anticoagulation in a few days - Likely can resume metoprolol in the next day   #COVID-19 Patient incidentally tested positive for COVID on 1/6. She remains asymptomatic and SpO2 >90% on room air.  - Supportive care as needed  Best Practice: Diet: Cardiac diet  IVF: Fluids: none VTE: enoxaparin (LOVENOX) injection 40 mg Start: 07/18/21 1000 SCDs Start: 07/17/21 1428 Code: Full AB: none Therapy Recs: Pending Family Contact: Son, John, at bedside. DISPO: Anticipated discharge in 3-4 days to Rehab pending Medical stability.  Signature: Buddy Duty, D.O.  Internal Medicine Resident, PGY-1 [facility]  Pager: [phone] 6:44 AM, 07/18/2021   Please contact the on call pager after 5 pm and on weekends at [phone].

## 2021-07-19 ENCOUNTER — Encounter (HOSPITAL_COMMUNITY): Payer: PPO

## 2021-07-19 DIAGNOSIS — U071 COVID-19: Secondary | ICD-10-CM | POA: Diagnosis present

## 2021-07-19 LAB — CBC
HCT: 34.6 % — ABNORMAL LOW (ref 36.0–46.0)
Hemoglobin: 11.4 g/dL — ABNORMAL LOW (ref 12.0–15.0)
MCH: 30.5 pg (ref 26.0–34.0)
MCHC: 32.9 g/dL (ref 30.0–36.0)
MCV: 92.5 fL (ref 80.0–100.0)
Platelets: 302 10*3/uL (ref 150–400)
RBC: 3.74 MIL/uL — ABNORMAL LOW (ref 3.87–5.11)
RDW: 13.6 % (ref 11.5–15.5)
WBC: 8.3 10*3/uL (ref 4.0–10.5)
nRBC: 0 % (ref 0.0–0.2)

## 2021-07-19 LAB — APTT
aPTT: 58 seconds — ABNORMAL HIGH (ref 24–36)
aPTT: 83 seconds — ABNORMAL HIGH (ref 24–36)
aPTT: 118 seconds — ABNORMAL HIGH (ref 24–36)

## 2021-07-19 LAB — HEPARIN LEVEL (UNFRACTIONATED): Heparin Unfractionated: >1.1 IU/mL — ABNORMAL HIGH (ref 0.30–0.70)

## 2021-07-19 MED ORDER — METOPROLOL SUCCINATE ER 25 MG PO TB24
12.5000 mg | ORAL_TABLET | Freq: Every day | ORAL | Status: DC
  Administered 2021-07-19 – 2021-07-28 (×10): 12.5 mg via ORAL
  Filled 2021-07-19 (×10): qty 1

## 2021-07-19 NOTE — Progress Notes (Signed)
ANTICOAGULATION CONSULT NOTE - Initial Consult  Pharmacy Consult for Heparin Indication: atrial fibrillation  Allergies  Allergen Reactions   Aspirin Other (See Comments)    unknown   Codeine Hives and Itching   Doxycycline Nausea And Vomiting   Nsaids Other (See Comments)    Gi bleeding   Sulfonamide Derivatives Swelling   Prednisone Anxiety    Patient Measurements: Height: 5\' 2"  (157.5 cm) Weight: 58.9 kg (129 lb 13.6 oz) IBW/kg (Calculated) : 50.1 Heparin Dosing Weight: 58.9 kg   Vital Signs: Temp: 98 F (36.7 C) (01/08 0439) Temp Source: Oral (01/08 0439) BP: 108/72 (01/08 0439) Pulse Rate: 74 (01/08 0439)  Labs: Recent Labs    07/17/21 1228 07/18/21 0223 07/18/21 2159 07/19/21 0046 07/19/21 0914  HGB 12.4 12.3  --  11.4*  --   HCT 38.3 38.1  --  34.6*  --   PLT 309 306  --  302  --   APTT  --   --  >200* 58* 83*  HEPARINUNFRC  --   --  >1.10* >1.10*  --   CREATININE 0.72 0.79  --   --   --      Estimated Creatinine Clearance: 51.8 mL/min (by C-G formula based on SCr of 0.79 mg/dL).   Medical History: Past Medical History:  Diagnosis Date   Anxiety    Arthritis    osteoarthritis   Complication of anesthesia    GERD (gastroesophageal reflux disease)    Headache    migraines   History of bleeding ulcers 2010   History of hiatal hernia    Hypertension    Neuromuscular disorder (HCC)    tremors   Occasional tremors    PONV (postoperative nausea and vomiting)     Assessment: Patient presented to [facility] following a mechanical fall with left femoral neck fracture. Patient on apixaban PTA for atrial fibrillation (last dose 07/16/2021 at 2200). Patient is scheduled for surgery Monday 1/9 for ORIF. Neurosurgery evaluated the patient d/t recent AIS in 11/22 and has signed off on anticoagulation pending surgery. Pharmacy has been consulted for heparin dosing following.   APTT currently therapeutic at 83 with heparin running at 950 units/h. Initial aPTT and HL  were both elevated but drawn form the same site as the heparin infusion. On redraw, aPTT was subtherapeutic and not correlating with HL, so will titrate based on APTT. Check HL with AM labs. CBC stable RN reports no s/s of bleeding.   Goal of Therapy:  Heparin level 0.3-0.7 units/ml Monitor platelets by anticoagulation protocol: Yes   Plan:  Continue heparin infusion at 950 units/hr  F/U 8h aPTT  Daily HL, CBC, APTT, monitor s/sx of bleeding F/U [facility] plans 1/9 post ORIF    Adria Dill, PharmD PGY-1 Acute Care Resident  07/19/2021 10:37 AM

## 2021-07-19 NOTE — TOC Initial Note (Signed)
Transition of Care [facility]) - Initial/Assessment Note    Patient Details  Name: Mckenzie Guerrero MRN: [id number] Date of Birth: Jul 07, 1951  Transition of Care [facility]) CM/SW Contact:    Vinie Sill, LCSW Phone Number: 07/19/2021, 11:52 AM  Clinical Narrative:                  CSW spoke with patient's son,Mckenzie Guerrero. CSW introduced self and explained role. CSW discussed therapy recommendation of short term rehab at [facility]. He acknowledges the need of SNF placement and agrees with the recommendations. He reports patient lives home alone but has the support of 3 son and her significant other. He states patient was discharged from [facility] 12/20 but now "she needs a facility". CSW explained the SNF process. SNF preference is in the [place] area. She has received covid vaccine. He plans on applying for Medicaid on Monday morning.  No noted questions or concerns at this time.  TOC will provide bed offers once available. TOC will continue to follow and assist with discharge planning.   Thurmond Butts, MSW, LCSW Clinical Social Worker    Expected Discharge Plan: Skilled Nursing Facility Barriers to Discharge: Continued Medical Work up, Ship broker, SNF Pending bed offer   Patient Goals and CMS Choice        Expected Discharge Plan and Services Expected Discharge Plan: [place] In-house Referral: Clinical Social Work                                            Prior Living Arrangements/Services     Patient language and need for interpreter reviewed:: No        Need for Family Participation in Patient Care: Yes (Comment) Care giver support system in place?: No (comment) (familyrequest SNF)   Criminal Activity/Legal Involvement Pertinent to Current Situation/Hospitalization: No - Comment as needed  Activities of Daily Living Home Assistive Devices/Equipment: None ADL Screening (condition at time of admission) Patient's cognitive ability adequate to  safely complete daily activities?: No Is the patient deaf or have difficulty hearing?: No Does the patient have difficulty seeing, even when wearing glasses/contacts?: No Does the patient have difficulty concentrating, remembering, or making decisions?: Yes Patient able to express need for assistance with ADLs?: No Does the patient have difficulty dressing or bathing?: Yes Independently performs ADLs?: No Communication: Needs assistance Is this a change from baseline?: Pre-admission baseline Dressing (OT): Needs assistance Is this a change from baseline?: Pre-admission baseline Grooming: Needs assistance Is this a change from baseline?: Pre-admission baseline Feeding: Needs assistance Is this a change from baseline?: Pre-admission baseline Bathing: Needs assistance Is this a change from baseline?: Pre-admission baseline Toileting: Needs assistance Is this a change from baseline?: Pre-admission baseline In/Out Bed: Needs assistance Is this a change from baseline?: Pre-admission baseline Walks in Home: Needs assistance Is this a change from baseline?: Pre-admission baseline Does the patient have difficulty walking or climbing stairs?: Yes Weakness of Legs: Both Weakness of Arms/Hands: Both  Permission Sought/Granted Permission sought to share information with : Family Supports    Share Information with NAME: Mckenzie Guerrero  Permission granted to share info w AGENCY: SNFs  Permission granted to share info w Relationship: son  Permission granted to share info w Contact Information: [phone]  Emotional Assessment       Orientation: : Oriented to Self Alcohol / Substance Use: Not Applicable Psych Involvement:  No (comment)  Admission diagnosis:  Humerus fracture [S42.309A] Fall [W19.XXXA] Closed displaced comminuted fracture of shaft of left humerus, initial encounter [S42.352A] Fall in home, initial encounter [W19.Merril Abbe, N17.001] Patient Active Problem List   Diagnosis Date  Noted   Humerus fracture 07/17/2021   Constipation 06/30/2021   Combined receptive and expressive aphasia 06/30/2021   Dysphagia, post-stroke    Acute ischemic left middle cerebral artery (MCA) stroke ([place]) 06/13/2021   Acute ischemic stroke [facility])    Urinary retention    Right hemiparesis (HCC)    AF (paroxysmal atrial fibrillation) ([place]) 06/11/2021   Hyperlipidemia, unspecified 06/10/2021   History of migraine 06/10/2021   Thrombocytosis 06/10/2021   History of repair of hiatal hernia 06/10/2021   Acute CVA (cerebrovascular accident) ([place]) 06/08/2021   HTN (hypertension) 06/08/2021   Leukocytosis 06/08/2021   Status post laparoscopic Nissen fundoplication 74/94/4967   Cervical spondylosis with myelopathy and radiculopathy 09/28/2019   PCP:  Raina Mina., MD Pharmacy:   [place] [place] [address] [place] [address] Phone: [phone] Fax: [phone]  [facility] [address] [place] [address] Phone: [phone] Fax: [phone]     Social Determinants of Health (SDOH) Interventions    Readmission Risk Interventions No flowsheet data found.

## 2021-07-19 NOTE — Progress Notes (Signed)
ANTICOAGULATION CONSULT NOTE - Initial Consult  Pharmacy Consult for Heparin Indication: atrial fibrillation  Allergies  Allergen Reactions   Aspirin Other (See Comments)    unknown   Codeine Hives and Itching   Doxycycline Nausea And Vomiting   Nsaids Other (See Comments)    Gi bleeding   Sulfonamide Derivatives Swelling   Prednisone Anxiety    Patient Measurements: Height: 5\' 2"  (157.5 cm) Weight: 58.9 kg (129 lb 13.6 oz) IBW/kg (Calculated) : 50.1 Heparin Dosing Weight: 58.9 kg   Vital Signs: Temp: 97.5 F (36.4 C) (01/08 1210) Temp Source: Axillary (01/08 1210) BP: 113/73 (01/08 1210) Pulse Rate: 89 (01/08 1210)  Labs: Recent Labs    07/17/21 1228 07/18/21 0223 07/18/21 2159 07/18/21 2159 07/19/21 0046 07/19/21 0914 07/19/21 1729  HGB 12.4 12.3  --   --  11.4*  --   --   HCT 38.3 38.1  --   --  34.6*  --   --   PLT 309 306  --   --  302  --   --   APTT  --   --  >200*   < > 58* 83* 118*  HEPARINUNFRC  --   --  >1.10*  --  >1.10*  --   --   CREATININE 0.72 0.79  --   --   --   --   --    < > = values in this interval not displayed.     Estimated Creatinine Clearance: 51.8 mL/min (by C-G formula based on SCr of 0.79 mg/dL).   Medical History: Past Medical History:  Diagnosis Date   Anxiety    Arthritis    osteoarthritis   Complication of anesthesia    GERD (gastroesophageal reflux disease)    Headache    migraines   History of bleeding ulcers 2010   History of hiatal hernia    Hypertension    Neuromuscular disorder (HCC)    tremors   Occasional tremors    PONV (postoperative nausea and vomiting)     Assessment: Patient presented to [facility] following a mechanical fall with left femoral neck fracture. Patient on apixaban PTA for atrial fibrillation (last dose 07/16/2021 at 2200). Patient is scheduled for surgery Monday 1/9 for ORIF. Neurosurgery evaluated the patient d/t recent AIS in 11/22 and has signed off on anticoagulation pending surgery.  Pharmacy has been consulted for heparin dosing following.   APTT now above goal at 118s with heparin running at 950 units/h. CBC stable RN reports no s/s of bleeding. Orders to stop heparin at midnight.   Goal of Therapy:  Heparin level 0.3-0.7 units/ml Monitor platelets by anticoagulation protocol: Yes   Plan:  Reduce heparin infusion to 850 units/hr  Off at midnight F/U [facility] plans 1/9 post ORIF    Erin Hearing PharmD., BCPS Clinical Pharmacist 07/19/2021 7:40 PM

## 2021-07-19 NOTE — Anesthesia Preprocedure Evaluation (Addendum)
Anesthesia Evaluation  Patient identified by MRN, date of birth, ID band Patient confused    Reviewed: Allergy & Precautions, NPO status , Patient's Chart, lab work & pertinent test results  History of Anesthesia Complications (+) PONV and history of anesthetic complications  Airway Mallampati: II  TM Distance: >3 FB Neck ROM: Full    Dental  (+) Dental Advisory Given, Edentulous Upper   Pulmonary former smoker,  COVID + 07/17/30, per son has had a cough/cold symptoms for about a week now    Pulmonary exam normal breath sounds clear to auscultation       Cardiovascular hypertension, Pt. on medications and Pt. on home beta blockers Normal cardiovascular exam+ dysrhythmias (eliquis) Atrial Fibrillation  Rhythm:Regular Rate:Normal  Echo 2022: 1. Left ventricular ejection fraction, by estimation, is 60 to 65%. The  left ventricle has normal function. The left ventricle has no regional  wall motion abnormalities. Left ventricular diastolic parameters are  consistent with Grade I diastolic  dysfunction (impaired relaxation).  2. Right ventricular systolic function is normal. The right ventricular  size is normal. There is normal pulmonary artery systolic pressure. The  estimated right ventricular systolic pressure is 21.2 mmHg.  3. Left atrial size was mildly dilated.  4. Right atrial size was mildly dilated.  5. The mitral valve is grossly normal. Trivial mitral valve  regurgitation. No evidence of mitral stenosis.  6. The aortic valve is tricuspid. Aortic valve regurgitation is trivial.  No aortic stenosis is present.  7. There is moderate dilatation of the ascending aorta, measuring 43 mm.  8. The inferior vena cava is normal in size with greater than 50%  respiratory variability, suggesting right atrial pressure of 3 mmHg.    Neuro/Psych  Headaches, PSYCHIATRIC DISORDERS Anxiety recent CVA (admitted 11/28-12/3; at [facility]  from 12/3-12/20) who presented to the hospital after a fall- has non-intelligible speech at baseline secondary to her recent CVA, CVA, Residual Symptoms    GI/Hepatic Neg liver ROS, hiatal hernia, GERD  Controlled,  Endo/Other  negative endocrine ROS  Renal/GU negative Renal ROS  negative genitourinary   Musculoskeletal  (+) Arthritis , Osteoarthritis,  L distal humerus fx   Abdominal   Peds  Hematology negative hematology ROS (+)   Anesthesia Other Findings   Reproductive/Obstetrics negative OB ROS                           Anesthesia Physical Anesthesia Plan  ASA: 3  Anesthesia Plan: General and Regional   Post-op Pain Management: Regional block   Induction: Intravenous  PONV Risk Score and Plan: 4 or greater and Ondansetron, Dexamethasone and Treatment may vary due to age or medical condition  Airway Management Planned: Oral ETT and Video Laryngoscope Planned  Additional Equipment: None  Intra-op Plan:   Post-operative Plan: Extubation in OR and Possible Post-op intubation/ventilation  Informed Consent: I have reviewed the patients History and Physical, chart, labs and discussed the procedure including the risks, benefits and alternatives for the proposed anesthesia with the patient or authorized representative who has indicated his/her understanding and acceptance.     Dental advisory given and Consent reviewed with POA  Plan Discussed with: CRNA  Anesthesia Plan Comments: (D/w son Simisola Sandles over the phone. Pt minimally verbal at baseline s/p stroke )      Anesthesia Quick Evaluation

## 2021-07-19 NOTE — Progress Notes (Signed)
ANTICOAGULATION CONSULT NOTE - Initial Consult  Pharmacy Consult for Heparin Indication: atrial fibrillation  Allergies  Allergen Reactions   Aspirin Other (See Comments)    unknown   Codeine Hives and Itching   Doxycycline Nausea And Vomiting   Nsaids Other (See Comments)    Gi bleeding   Sulfonamide Derivatives Swelling   Prednisone Anxiety    Patient Measurements: Height: 5\' 2"  (157.5 cm) Weight: 58.9 kg (129 lb 13.6 oz) IBW/kg (Calculated) : 50.1 Heparin Dosing Weight: 58.9 kg   Vital Signs: Temp: 98.3 F (36.8 C) (01/08 0014) Temp Source: Oral (01/08 0014) BP: 107/69 (01/08 0014) Pulse Rate: 73 (01/08 0014)  Labs: Recent Labs    07/17/21 1228 07/18/21 0223 07/18/21 2159 07/19/21 0046  HGB 12.4 12.3  --  11.4*  HCT 38.3 38.1  --  34.6*  PLT 309 306  --  302  APTT  --   --  >200* 58*  HEPARINUNFRC  --   --  >1.10* >1.10*  CREATININE 0.72 0.79  --   --      Estimated Creatinine Clearance: 51.8 mL/min (by C-G formula based on SCr of 0.79 mg/dL).   Medical History: Past Medical History:  Diagnosis Date   Anxiety    Arthritis    osteoarthritis   Complication of anesthesia    GERD (gastroesophageal reflux disease)    Headache    migraines   History of bleeding ulcers 2010   History of hiatal hernia    Hypertension    Neuromuscular disorder (HCC)    tremors   Occasional tremors    PONV (postoperative nausea and vomiting)     Assessment: Patient presented to [facility] following a mechanical fall with left femoral neck fracture. Patient on apixaban PTA for atrial fibrillation (last dose 07/16/2021 at 2200). Patient is scheduled for surgery Monday 1/9 for ORIF. Neurosurgery evaluated the patient d/t recent AIS in 11/22 and has signed off on anticoagulation pending surgery. Pharmacy has been consulted for heparin dosing following.   Currently on IV heparin at 850 units/hr. Initial aPTT and HL were both elevated but drawn form the same site as the heparin  infusion. On a redrawn, aPTT was subtherapeutic at 58 and not correlating with HL. RN reports no s/s of bleeding   Goal of Therapy:  Heparin level 0.3-0.7 units/ml Monitor platelets by anticoagulation protocol: Yes   Plan:  Increase heparin infusion to 950 units/hr  F/U 8h aPTT  Daily HL, CBC, APTT, monitor s/sx of bleeding   Albertina Parr, PharmD., BCCCP Clinical Pharmacist Please refer to [facility] for unit-specific pharmacist

## 2021-07-19 NOTE — NC FL2 (Signed)
[place] LEVEL OF CARE SCREENING TOOL     IDENTIFICATION  Patient Name: Mckenzie Guerrero Birthdate: 24-Aug-1950 Sex: female Admission Date (Current Location): 07/17/2021  [place] and [place] Number:   [facility])   Facility and Address:  [facility] 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   Dysphagia, post-stroke    Acute ischemic left middle cerebral artery (MCA) stroke (HCC) 06/13/2021   Acute ischemic stroke [facility]    Urinary retention    Right hemiparesis (HCC)    AF (paroxysmal atrial fibrillation) ([place]) 06/11/2021   Hyperlipidemia, unspecified 06/10/2021   History of migraine 06/10/2021   Thrombocytosis 06/10/2021   History of repair of hiatal hernia 06/10/2021   Acute CVA (cerebrovascular accident) ([place]) 06/08/2021   HTN (hypertension) 06/08/2021   Leukocytosis 06/08/2021   Status post laparoscopic Nissen fundoplication 72/53/6644   Cervical spondylosis with myelopathy and radiculopathy 09/28/2019    Orientation RESPIRATION BLADDER Height & Weight     Self  Normal External catheter, Continent Weight: 129 lb 13.6 oz (58.9 kg) Height:  5\' 2"  (157.5 cm)  BEHAVIORAL SYMPTOMS/MOOD NEUROLOGICAL BOWEL NUTRITION STATUS      Continent Diet (please see discharge summary)  AMBULATORY STATUS COMMUNICATION OF NEEDS Skin   Extensive Assist Verbally Other (Comment) (Ecchymosis)                       Personal Care  Assistance Level of Assistance  Bathing, Feeding, Dressing Bathing Assistance: Maximum assistance Feeding assistance: Limited assistance Dressing Assistance: Maximum assistance     Functional Limitations Info  Sight, Hearing, Speech Sight Info: Adequate Hearing Info: Adequate Speech Info: Adequate    SPECIAL CARE FACTORS FREQUENCY  PT (By licensed PT), OT (By licensed OT)     PT Frequency: 5x per week OT Frequency: 5x per week            Contractures Contractures Info: Not present    Additional Factors Info  Code Status, Allergies Code Status Info: Full Allergies Info: Aspirin,Codeine,Doxycycline,Nsaids,Sulfonamide Derivatives,Prednisone           Current Medications (07/19/2021):  This is the current hospital active medication list Current Facility-Administered Medications  Medication Dose Route Frequency Provider Last Rate Last Admin   acetaminophen (TYLENOL) tablet 650 mg  650 mg Oral Q6H PRN Sanjuan Dame, MD       Or   acetaminophen (TYLENOL) suppository 650 mg  650 mg Rectal Q6H PRN Sanjuan Dame, MD       atorvastatin (LIPITOR) tablet 40 mg  40 mg Oral Daily Sanjuan Dame, MD   40 mg at 07/19/21 0842   feeding supplement (ENSURE ENLIVE / ENSURE PLUS) liquid 237 mL  237 mL Oral BID BM Angelica Pou, MD   237 mL at 07/19/21 1014   heparin ADULT infusion 100 units/mL (25000 units/274mL)  950 Units/hr Intravenous Continuous Lavenia Atlas, RPH 9.5 mL/hr at 07/19/21 0520 950 Units/hr at 07/19/21 0520  HYDROcodone-acetaminophen (NORCO/VICODIN) 5-325 MG per tablet 1-2 tablet  1-2 tablet Oral Q4H Sanjuan Dame, MD   1 tablet at 07/19/21 1210   HYDROmorphone (DILAUDID) injection 0.5 mg  0.5 mg Intravenous Q4H PRN Sanjuan Dame, MD   0.5 mg at 07/17/21 1610   metoprolol succinate (TOPROL-XL) 24 hr tablet 12.5 mg  12.5 mg Oral Q breakfast Sanjuan Dame, MD   12.5 mg at 07/19/21 1210   nystatin cream (MYCOSTATIN)   Topical BID Orvis Brill, MD   Given at 07/19/21 0005   polyethylene glycol (MIRALAX / GLYCOLAX) packet 17 g  17 g Oral Daily Sanjuan Dame, MD   17 g at 07/19/21 3833   senna-docusate (Senokot-S) tablet 1 tablet  1 tablet Oral BID Sanjuan Dame, MD   1 tablet at 07/19/21 3832     Discharge Medications: Please see discharge summary for a list of discharge medications.  Relevant Imaging Results:  Relevant Lab Results:   Additional Information SSN [id number]   family states she has received covid vaccine  Vinie Sill, LCSW

## 2021-07-19 NOTE — Progress Notes (Addendum)
° °  NAME:  SILVA AAMODT, MRN:  [id number], DOB:  01-22-1951, LOS: 2 ADMISSION DATE:  07/17/2021  Subjective  Patient evaluated at bedside this AM.  Patient doing well this morning, denies current pain.  Objective   Blood pressure 113/73, pulse 89, temperature (!) 97.5 F (36.4 C), temperature source Axillary, resp. rate 18, height 5\' 2"  (1.575 m), weight 58.9 kg, SpO2 97 %.     Intake/Output Summary (Last 24 hours) at 07/19/2021 1800 Last data filed at 07/19/2021 0647 Gross per 24 hour  Intake 290.78 ml  Output 500 ml  Net -209.22 ml   Filed Weights   07/17/21 2155  Weight: 58.9 kg   Physical Exam: General: Resting comfortably in bed, no acute distress CV: Regular rate, rhythm. No murmurs. Distal pulses 2+ bilaterally. Pulm: Normal work of breathing on room air. Abdomen: Soft, non-tender, non-distended. Normoactive bowel sounds MSK: Normal bulk, tone. L arm in sling.  Labs    CBC Latest Ref Rng & Units 07/19/2021 07/18/2021 07/17/2021  WBC 4.0 - 10.5 K/uL 8.3 9.7 11.4(H)  Hemoglobin 12.0 - 15.0 g/dL 11.4(L) 12.3 12.4  Hematocrit 36.0 - 46.0 % 34.6(L) 38.1 38.3  Platelets 150 - 400 K/uL 302 306 309   BMP Latest Ref Rng & Units 07/18/2021 07/17/2021 06/29/2021  Glucose 70 - 99 mg/dL 95 118(H) 99  BUN 8 - 23 mg/dL 15 12 22   Creatinine 0.44 - 1.00 mg/dL 0.79 0.72 0.82  Sodium 135 - 145 mmol/L 136 134(L) 138  Potassium 3.5 - 5.1 mmol/L 3.6 3.7 4.2  Chloride 98 - 111 mmol/L 105 103 105  CO2 22 - 32 mmol/L 20(L) 22 25  Calcium 8.9 - 10.3 mg/dL 9.0 8.8(L) 9.5   Summary  Jacqulin Brandenburger is 71yo with hypertension, atrial fibrillation on Eliquis, recent CVA admitted 1/6 with acute left humerus fracture and incidentally found to have COVID-19.  Assessment & Plan:  Principal Problem:   Humerus fracture Active Problems:   HTN (hypertension)   AF (paroxysmal atrial fibrillation) (HCC)   COVID-19 virus infection  #Left midshaft humerus fracture Initial concern patient had another CVA which  led to fall/fracture. However, after further discussions with neurology and radiology, it was determined that patient did not have further stroke. We have resumed anticoagulation and will plan on surgical intervention for left midshaft humerus fracture tomorrow morning. - NPO @ MN - ORIF tomorrow morning with orthopedics - Non-weight bearing left upper extremity - Norco 5-325mg  every four hours - Diaudid 0.5mg  every four hours as needed - Bowel regimen in place  #Paroxysmal atrial fibrillation  Currently in normal sinus rhythm. Holding home Eliquis in setting of surgical intervention tomorrow morning. Currently on heparin gtt. Will continue with home beta blocker as well. - Continue heparin gtt, will d/c at midnight - Continue home metoprolol 12.5mg  daily  #COVID-19 infection Remains asymptomatic, sating well on room air.   Best practice:  DIET: HH, NPO @ MN IVF: n/a DVT PPX: heparin BOWEL: Miralax, Senokot CODE: FULL FAM COM: Son updated at bedside 1/7  Sanjuan Dame, MD Internal Medicine Resident PGY-2 PAGER: [phone] 07/19/2021 6:00 PM  If after hours (below), please contact on-call pager: [phone] 5PM-7AM Monday-Friday 1PM-7AM Saturday-Sunday

## 2021-07-20 ENCOUNTER — Encounter (HOSPITAL_COMMUNITY)
Admission: EM | Disposition: A | Discharge status: Skilled Nursing Facility | Payer: Self-pay | Source: Home / Self Care | Attending: Student in an Organized Health Care Education/Training Program

## 2021-07-20 ENCOUNTER — Encounter (HOSPITAL_COMMUNITY): Payer: PPO

## 2021-07-20 ENCOUNTER — Inpatient Hospital Stay (HOSPITAL_COMMUNITY): Payer: PPO | Admitting: Anesthesiology

## 2021-07-20 ENCOUNTER — Inpatient Hospital Stay (HOSPITAL_COMMUNITY): Payer: PPO

## 2021-07-20 ENCOUNTER — Encounter (HOSPITAL_COMMUNITY): Payer: Self-pay | Admitting: Internal Medicine

## 2021-07-20 DIAGNOSIS — S42202A Unspecified fracture of upper end of left humerus, initial encounter for closed fracture: Secondary | ICD-10-CM | POA: Diagnosis not present

## 2021-07-20 HISTORY — PX: ORIF HUMERUS FRACTURE: SHX2126

## 2021-07-20 LAB — CBC
HCT: 35.5 % — ABNORMAL LOW (ref 36.0–46.0)
Hemoglobin: 11.2 g/dL — ABNORMAL LOW (ref 12.0–15.0)
MCH: 29.7 pg (ref 26.0–34.0)
MCHC: 31.5 g/dL (ref 30.0–36.0)
MCV: 94.2 fL (ref 80.0–100.0)
Platelets: 311 10*3/uL (ref 150–400)
RBC: 3.77 MIL/uL — ABNORMAL LOW (ref 3.87–5.11)
RDW: 13.4 % (ref 11.5–15.5)
WBC: 10.6 10*3/uL — ABNORMAL HIGH (ref 4.0–10.5)
nRBC: 0 % (ref 0.0–0.2)

## 2021-07-20 LAB — MRSA NEXT GEN BY PCR, NASAL: MRSA by PCR Next Gen: NOT DETECTED

## 2021-07-20 LAB — VITAMIN D 25 HYDROXY (VIT D DEFICIENCY, FRACTURES): Vit D, 25-Hydroxy: 55.53 ng/mL (ref 30–100)

## 2021-07-20 SURGERY — OPEN REDUCTION INTERNAL FIXATION (ORIF) DISTAL HUMERUS FRACTURE
Anesthesia: Regional | Laterality: Left

## 2021-07-20 MED ORDER — ROCURONIUM BROMIDE 10 MG/ML (PF) SYRINGE
PREFILLED_SYRINGE | INTRAVENOUS | Status: AC
  Filled 2021-07-20: qty 10

## 2021-07-20 MED ORDER — ONDANSETRON HCL 4 MG/2ML IJ SOLN
INTRAMUSCULAR | Status: DC | PRN
Start: 2021-07-20 — End: 2021-07-20
  Administered 2021-07-20: 4 mg via INTRAVENOUS

## 2021-07-20 MED ORDER — ONDANSETRON HCL 4 MG/2ML IJ SOLN: 4.0000 mg | Freq: Four times a day (QID) | INTRAMUSCULAR | Status: DC | PRN

## 2021-07-20 MED ORDER — FENTANYL CITRATE (PF) 100 MCG/2ML IJ SOLN: 25.0000 ug | INTRAMUSCULAR | Status: DC | PRN

## 2021-07-20 MED ORDER — ONDANSETRON HCL 4 MG/2ML IJ SOLN
INTRAMUSCULAR | Status: AC
  Filled 2021-07-20: qty 2

## 2021-07-20 MED ORDER — FENTANYL CITRATE (PF) 100 MCG/2ML IJ SOLN
INTRAMUSCULAR | Status: DC | PRN
  Administered 2021-07-20 (×2): 50 ug via INTRAVENOUS

## 2021-07-20 MED ORDER — LIDOCAINE 2% (20 MG/ML) 5 ML SYRINGE
INTRAMUSCULAR | Status: AC
  Filled 2021-07-20: qty 5

## 2021-07-20 MED ORDER — CEFAZOLIN SODIUM-DEXTROSE 2-3 GM-%(50ML) IV SOLR
INTRAVENOUS | Status: DC | PRN
  Administered 2021-07-20: 2 g via INTRAVENOUS

## 2021-07-20 MED ORDER — LIDOCAINE 2% (20 MG/ML) 5 ML SYRINGE
INTRAMUSCULAR | Status: DC | PRN
  Administered 2021-07-20: 20 mg via INTRAVENOUS

## 2021-07-20 MED ORDER — DEXAMETHASONE SODIUM PHOSPHATE 10 MG/ML IJ SOLN
INTRAMUSCULAR | Status: DC | PRN
  Administered 2021-07-20: 10 mg

## 2021-07-20 MED ORDER — BUPIVACAINE HCL (PF) 0.5 % IJ SOLN
INTRAMUSCULAR | Status: DC | PRN
  Administered 2021-07-20: 25 mL via PERINEURAL

## 2021-07-20 MED ORDER — DOCUSATE SODIUM 100 MG PO CAPS
100.0000 mg | ORAL_CAPSULE | Freq: Two times a day (BID) | ORAL | Status: DC
  Administered 2021-07-20 – 2021-07-28 (×12): 100 mg via ORAL
  Filled 2021-07-20 (×14): qty 1

## 2021-07-20 MED ORDER — CEFAZOLIN SODIUM 1 G IJ SOLR
INTRAMUSCULAR | Status: AC
  Filled 2021-07-20: qty 40

## 2021-07-20 MED ORDER — PHENYLEPHRINE HCL (PRESSORS) 10 MG/ML IV SOLN
INTRAVENOUS | Status: DC | PRN
Start: 2021-07-20 — End: 2021-07-20
  Administered 2021-07-20: 120 ug via INTRAVENOUS
  Administered 2021-07-20: 80 ug via INTRAVENOUS
  Administered 2021-07-20 (×3): 120 ug via INTRAVENOUS

## 2021-07-20 MED ORDER — PROPOFOL 10 MG/ML IV BOLUS
INTRAVENOUS | Status: AC
  Filled 2021-07-20: qty 20

## 2021-07-20 MED ORDER — LACTATED RINGERS IV SOLN: INTRAVENOUS | Status: DC | PRN

## 2021-07-20 MED ORDER — METOCLOPRAMIDE HCL 5 MG PO TABS: 5.0000 mg | ORAL_TABLET | Freq: Three times a day (TID) | ORAL | Status: DC | PRN

## 2021-07-20 MED ORDER — MIDAZOLAM HCL 2 MG/2ML IJ SOLN
INTRAMUSCULAR | Status: DC | PRN
Start: 2021-07-20 — End: 2021-07-20
  Administered 2021-07-20: 2 mg via INTRAVENOUS

## 2021-07-20 MED ORDER — FENTANYL CITRATE (PF) 250 MCG/5ML IJ SOLN
INTRAMUSCULAR | Status: AC
  Filled 2021-07-20: qty 5

## 2021-07-20 MED ORDER — VANCOMYCIN HCL 1000 MG IV SOLR
INTRAVENOUS | Status: AC
  Filled 2021-07-20: qty 20

## 2021-07-20 MED ORDER — SUCCINYLCHOLINE CHLORIDE 200 MG/10ML IV SOSY
PREFILLED_SYRINGE | INTRAVENOUS | Status: AC
  Filled 2021-07-20: qty 10

## 2021-07-20 MED ORDER — CHLORHEXIDINE GLUCONATE 4 % EX LIQD
60.0000 mL | Freq: Once | CUTANEOUS | Status: AC
  Administered 2021-07-20: 4 via TOPICAL
  Filled 2021-07-20: qty 60

## 2021-07-20 MED ORDER — DEXMEDETOMIDINE (PRECEDEX) IN NS 20 MCG/5ML (4 MCG/ML) IV SYRINGE
PREFILLED_SYRINGE | INTRAVENOUS | Status: DC | PRN
  Administered 2021-07-20: 8 ug via INTRAVENOUS
  Administered 2021-07-20: 12 ug via INTRAVENOUS

## 2021-07-20 MED ORDER — DEXAMETHASONE SODIUM PHOSPHATE 10 MG/ML IJ SOLN
INTRAMUSCULAR | Status: AC
  Filled 2021-07-20: qty 1

## 2021-07-20 MED ORDER — SODIUM CHLORIDE 0.9 % IV SOLN: INTRAVENOUS | Status: DC

## 2021-07-20 MED ORDER — METOCLOPRAMIDE HCL 5 MG/ML IJ SOLN: 5.0000 mg | Freq: Three times a day (TID) | INTRAMUSCULAR | Status: DC | PRN

## 2021-07-20 MED ORDER — CEFAZOLIN SODIUM-DEXTROSE 2-4 GM/100ML-% IV SOLN
2.0000 g | INTRAVENOUS | Status: AC
  Administered 2021-07-20: 2 g via INTRAVENOUS

## 2021-07-20 MED ORDER — POVIDONE-IODINE 10 % EX SWAB
2.0000 "application " | Freq: Once | CUTANEOUS | Status: AC
  Administered 2021-07-20: 2 via TOPICAL

## 2021-07-20 MED ORDER — ONDANSETRON HCL 4 MG/2ML IJ SOLN: 4.0000 mg | Freq: Once | INTRAMUSCULAR | Status: DC | PRN

## 2021-07-20 MED ORDER — SUGAMMADEX SODIUM 500 MG/5ML IV SOLN
INTRAVENOUS | Status: AC
  Filled 2021-07-20: qty 5

## 2021-07-20 MED ORDER — ALBUMIN HUMAN 5 % IV SOLN: INTRAVENOUS | Status: DC | PRN

## 2021-07-20 MED ORDER — PHENYLEPHRINE HCL-NACL 20-0.9 MG/250ML-% IV SOLN
INTRAVENOUS | Status: DC | PRN
  Administered 2021-07-20: 50 ug/min via INTRAVENOUS

## 2021-07-20 MED ORDER — ACETAMINOPHEN 325 MG PO TABS
325.0000 mg | ORAL_TABLET | Freq: Four times a day (QID) | ORAL | Status: DC | PRN
  Administered 2021-07-24: 650 mg via ORAL
  Filled 2021-07-20: qty 2

## 2021-07-20 MED ORDER — ROCURONIUM BROMIDE 10 MG/ML (PF) SYRINGE
PREFILLED_SYRINGE | INTRAVENOUS | Status: DC | PRN
  Administered 2021-07-20: 60 mg via INTRAVENOUS

## 2021-07-20 MED ORDER — HYDROMORPHONE HCL 1 MG/ML IJ SOLN
0.5000 mg | INTRAMUSCULAR | Status: DC | PRN
  Administered 2021-07-20 – 2021-07-21 (×3): 1 mg via INTRAVENOUS
  Filled 2021-07-20 (×3): qty 1

## 2021-07-20 MED ORDER — VANCOMYCIN HCL 1000 MG IV SOLR
INTRAVENOUS | Status: DC | PRN
  Administered 2021-07-20: 1000 mg via TOPICAL

## 2021-07-20 MED ORDER — ONDANSETRON HCL 4 MG PO TABS: 4.0000 mg | ORAL_TABLET | Freq: Four times a day (QID) | ORAL | Status: DC | PRN

## 2021-07-20 MED ORDER — HYDROCODONE-ACETAMINOPHEN 5-325 MG PO TABS
1.0000 | ORAL_TABLET | ORAL | Status: DC | PRN
  Administered 2021-07-21 – 2021-07-22 (×2): 2 via ORAL
  Administered 2021-07-23: 1 via ORAL
  Administered 2021-07-24 (×2): 2 via ORAL
  Administered 2021-07-25: 1 via ORAL
  Filled 2021-07-20 (×4): qty 2
  Filled 2021-07-20 (×2): qty 1

## 2021-07-20 MED ORDER — 0.9 % SODIUM CHLORIDE (POUR BTL) OPTIME
TOPICAL | Status: DC | PRN
Start: 2021-07-20 — End: 2021-07-20
  Administered 2021-07-20: 1000 mL

## 2021-07-20 MED ORDER — PROPOFOL 10 MG/ML IV BOLUS
INTRAVENOUS | Status: DC | PRN
Start: 2021-07-20 — End: 2021-07-20
  Administered 2021-07-20: 80 mg via INTRAVENOUS

## 2021-07-20 MED ORDER — HYDROCODONE-ACETAMINOPHEN 7.5-325 MG PO TABS: 1.0000 | ORAL_TABLET | ORAL | Status: DC | PRN

## 2021-07-20 MED ORDER — POLYETHYLENE GLYCOL 3350 17 G PO PACK: 17.0000 g | PACK | Freq: Every day | ORAL | Status: DC | PRN

## 2021-07-20 MED ORDER — MIDAZOLAM HCL 2 MG/2ML IJ SOLN
INTRAMUSCULAR | Status: AC
  Filled 2021-07-20: qty 2

## 2021-07-20 SURGICAL SUPPLY — 86 items
ADH SKN CLS APL DERMABOND .7 (GAUZE/BANDAGES/DRESSINGS) ×1
BAG COUNTER SPONGE SURGICOUNT (BAG) ×2 IMPLANT
BAG SPNG CNTER NS LX DISP (BAG) ×1
BIT DRILL QC 2.8X135 (BIT) ×1 IMPLANT
BIT DRILL QC 2X140 (BIT) ×1 IMPLANT
BIT DRILL QC SFS 2.5X170 (BIT) ×1 IMPLANT
BLADE AVERAGE 25X9 (BLADE) IMPLANT
BNDG CMPR 9X4 STRL LF SNTH (GAUZE/BANDAGES/DRESSINGS)
BNDG COHESIVE 4X5 TAN STRL (GAUZE/BANDAGES/DRESSINGS) ×2 IMPLANT
BNDG ELASTIC 3X5.8 VLCR STR LF (GAUZE/BANDAGES/DRESSINGS) ×1 IMPLANT
BNDG ELASTIC 4X5.8 VLCR STR LF (GAUZE/BANDAGES/DRESSINGS) ×1 IMPLANT
BNDG ESMARK 4X9 LF (GAUZE/BANDAGES/DRESSINGS) IMPLANT
BNDG GAUZE ELAST 4 BULKY (GAUZE/BANDAGES/DRESSINGS) ×2 IMPLANT
BRUSH SCRUB EZ PLAIN DRY (MISCELLANEOUS) ×4 IMPLANT
CLEANER TIP ELECTROSURG 2X2 (MISCELLANEOUS) ×2 IMPLANT
CORD BIPOLAR FORCEPS 12FT (ELECTRODE) ×2 IMPLANT
COVER SURGICAL LIGHT HANDLE (MISCELLANEOUS) ×2 IMPLANT
DERMABOND ADVANCED (GAUZE/BANDAGES/DRESSINGS) ×1
DERMABOND ADVANCED .7 DNX12 (GAUZE/BANDAGES/DRESSINGS) IMPLANT
DRAIN PENROSE 1/4X12 LTX STRL (WOUND CARE) IMPLANT
DRAPE C-ARM 42X72 X-RAY (DRAPES) ×2 IMPLANT
DRAPE C-ARMOR (DRAPES) IMPLANT
DRAPE HALF SHEET 40X57 (DRAPES) ×2 IMPLANT
DRAPE INCISE IOBAN 66X45 STRL (DRAPES) IMPLANT
DRAPE ORTHO SPLIT 77X108 STRL (DRAPES) ×2
DRAPE SURG ORHT 6 SPLT 77X108 (DRAPES) ×1 IMPLANT
DRAPE U-SHAPE 47X51 STRL (DRAPES) ×2 IMPLANT
DRSG ADAPTIC 3X8 NADH LF (GAUZE/BANDAGES/DRESSINGS) ×1 IMPLANT
DRSG MEPITEL 4X7.2 (GAUZE/BANDAGES/DRESSINGS) IMPLANT
DRSG PAD ABDOMINAL 8X10 ST (GAUZE/BANDAGES/DRESSINGS) ×1 IMPLANT
ELECT REM PT RETURN 9FT ADLT (ELECTROSURGICAL) ×2
ELECTRODE REM PT RTRN 9FT ADLT (ELECTROSURGICAL) ×1 IMPLANT
EVACUATOR 1/8 PVC DRAIN (DRAIN) IMPLANT
GAUZE SPONGE 4X4 12PLY STRL (GAUZE/BANDAGES/DRESSINGS) ×2 IMPLANT
GAUZE SPONGE 4X4 12PLY STRL LF (GAUZE/BANDAGES/DRESSINGS) ×1 IMPLANT
GLOVE SURG ENC MOIS LTX SZ6.5 (GLOVE) ×6 IMPLANT
GLOVE SURG ENC MOIS LTX SZ7.5 (GLOVE) ×6 IMPLANT
GLOVE SURG UNDER POLY LF SZ6.5 (GLOVE) ×2 IMPLANT
GLOVE SURG UNDER POLY LF SZ7.5 (GLOVE) ×2 IMPLANT
GOWN STRL REUS W/ TWL LRG LVL3 (GOWN DISPOSABLE) ×3 IMPLANT
GOWN STRL REUS W/ TWL XL LVL3 (GOWN DISPOSABLE) ×1 IMPLANT
GOWN STRL REUS W/TWL LRG LVL3 (GOWN DISPOSABLE) ×6
GOWN STRL REUS W/TWL XL LVL3 (GOWN DISPOSABLE) ×2
KIT BASIN OR (CUSTOM PROCEDURE TRAY) ×2 IMPLANT
KIT TURNOVER KIT B (KITS) ×2 IMPLANT
LOOP VESSEL MAXI BLUE (MISCELLANEOUS) ×2 IMPLANT
MANIFOLD NEPTUNE II (INSTRUMENTS) ×2 IMPLANT
NDL HYPO 25X1 1.5 SAFETY (NEEDLE) IMPLANT
NEEDLE HYPO 25X1 1.5 SAFETY (NEEDLE) IMPLANT
NS IRRIG 1000ML POUR BTL (IV SOLUTION) ×2 IMPLANT
PACK ORTHO EXTREMITY (CUSTOM PROCEDURE TRAY) ×2 IMPLANT
PAD ARMBOARD 7.5X6 YLW CONV (MISCELLANEOUS) ×4 IMPLANT
PAD CAST 3X4 CTTN HI CHSV (CAST SUPPLIES) IMPLANT
PAD CAST 4YDX4 CTTN HI CHSV (CAST SUPPLIES) IMPLANT
PADDING CAST COTTON 3X4 STRL (CAST SUPPLIES) ×2
PADDING CAST COTTON 4X4 STRL (CAST SUPPLIES) ×2
PLATE LCP 3.533X150 11H (Plate) ×1 IMPLANT
SCREW CORTEX 2.7X22 ST (Screw) IMPLANT
SCREW CORTEX 2.7X26MM (Screw) ×1 IMPLANT
SCREW CORTEX 3.5 22MM (Screw) ×3 IMPLANT
SCREW CORTEX 3.5 24MM (Screw) ×1 IMPLANT
SCREW LOCK CORT ST 3.5X22 (Screw) IMPLANT
SCREW LOCK CORT ST 3.5X24 (Screw) IMPLANT
SCREW LOCK T15 FT 22X3.5XST (Screw) IMPLANT
SCREW LOCK T15 FT 24X3.5X2.9X (Screw) IMPLANT
SCREW LOCKING 3.5X22 (Screw) ×6 IMPLANT
SCREW LOCKING 3.5X24 (Screw) ×2 IMPLANT
SCREW SELF TAP 22M (Screw) ×1 IMPLANT
SPONGE T-LAP 18X18 ~~LOC~~+RFID (SPONGE) ×1 IMPLANT
STAPLER VISISTAT 35W (STAPLE) IMPLANT
STOCKINETTE IMPERVIOUS 9X36 MD (GAUZE/BANDAGES/DRESSINGS) ×2 IMPLANT
SUCTION FRAZIER HANDLE 10FR (MISCELLANEOUS) ×2
SUCTION TUBE FRAZIER 10FR DISP (MISCELLANEOUS) ×1 IMPLANT
SUT ETHILON 3 0 PS 1 (SUTURE) ×4 IMPLANT
SUT MNCRL AB 3-0 PS2 18 (SUTURE) ×1 IMPLANT
SUT VIC AB 0 CT1 27 (SUTURE) ×6
SUT VIC AB 0 CT1 27XBRD ANBCTR (SUTURE) ×2 IMPLANT
SUT VIC AB 2-0 CT1 27 (SUTURE) ×6
SUT VIC AB 2-0 CT1 TAPERPNT 27 (SUTURE) ×2 IMPLANT
SYR 5ML LL (SYRINGE) IMPLANT
SYR CONTROL 10ML LL (SYRINGE) ×1 IMPLANT
TOWEL GREEN STERILE (TOWEL DISPOSABLE) ×2 IMPLANT
TOWEL GREEN STERILE FF (TOWEL DISPOSABLE) ×2 IMPLANT
TRAY FOLEY MTR SLVR 16FR STAT (SET/KITS/TRAYS/PACK) IMPLANT
WATER STERILE IRR 1000ML POUR (IV SOLUTION) ×2 IMPLANT
YANKAUER SUCT BULB TIP NO VENT (SUCTIONS) IMPLANT

## 2021-07-20 NOTE — Progress Notes (Signed)
Orthopedic Tech Progress Note Patient Details:  Mckenzie Guerrero 07-Dec-1950 [id number]  Patient ID: [name], female   DOB: 18-Mar-1951, 71 y.o.   MRN: [id number] No OHF due to upper extremity injury.  Vernona Rieger 07/20/2021, 6:12 PM

## 2021-07-20 NOTE — Anesthesia Procedure Notes (Signed)
Procedure Name: Intubation Date/Time: 07/20/2021 2:13 PM Performed by: Moshe Salisbury, CRNA Pre-anesthesia Checklist: Patient identified, Emergency Drugs available, Suction available and Patient being monitored Patient Re-evaluated:Patient Re-evaluated prior to induction Oxygen Delivery Method: Circle System Utilized Preoxygenation: Pre-oxygenation with 100% oxygen Induction Type: IV induction Ventilation: Mask ventilation without difficulty Laryngoscope Size: Glidescope and 4 Tube type: Oral Tube size: 7.0 mm Number of attempts: 2 Airway Equipment and Method: Rigid stylet and Video-laryngoscopy Placement Confirmation: ETT inserted through vocal cords under direct vision, positive ETCO2 and breath sounds checked- equal and bilateral Secured at: 20 cm Tube secured with: Tape Dental Injury: Teeth and Oropharynx as per pre-operative assessment

## 2021-07-20 NOTE — Anesthesia Procedure Notes (Signed)
Anesthesia Regional Block: Supraclavicular block   Pre-Anesthetic Checklist: , timeout performed,  Correct Patient, Correct Site, Correct Laterality,  Correct Procedure, Correct Position, site marked,  Risks and benefits discussed,  Surgical consent,  Pre-op evaluation,  At surgeon's request and post-op pain management  Laterality: Left  Prep: Maximum Sterile Barrier Precautions used, chloraprep       Needles:  Injection technique: Single-shot  Needle Type: Echogenic Stimulator Needle     Needle Length: 9cm  Needle Gauge: 22     Additional Needles:   Procedures:,,,, ultrasound used (permanent image in chart),,    Narrative:  Start time: 07/20/2021 1:52 PM End time: 07/20/2021 1:56 PM Injection made incrementally with aspirations every 5 mL.  Performed by: Personally  Anesthesiologist: Pervis Hocking, DO  Additional Notes: Monitors applied. No increased pain on injection. No increased resistance to injection. Injection made in 5cc increments. Good needle visualization. Patient tolerated procedure well.

## 2021-07-20 NOTE — Op Note (Signed)
Orthopaedic Surgery Operative Note (CSN: [id number] ) Date of Surgery: 07/20/2021  Admit Date: 07/17/2021   Diagnoses: Pre-Op Diagnoses: Left humeral shaft fracture  Post-Op Diagnosis: Same  Procedures: CPT 24515-Open reduction internal fixation of left humeral shaft fracture  Surgeons : Primary: Shona Needles, MD  Assistant: Patrecia Pace, PA-C  Location: OR 3   Anesthesia:General with regional block   Antibiotics: Ancef 2g preop with 1 gm vancomycin powder placed topically   Tourniquet time:None    Estimated Blood Loss: 384 mL  Complications:None  Specimens:None  Implants: Implant Name Type Inv. Item Serial No. Manufacturer Lot No. LRB No. Used Action  SCREW SELF TAP 25M - [id number] Screw SCREW SELF TAP 25M  [employer]  Left 1 Implanted  SCREW CORTEX 2.7X26MM - [id number] Screw SCREW CORTEX 2.7X26MM  [employer]  Left 1 Implanted  SCREW CORTEX 3.5 25MM - [id number] Screw SCREW CORTEX 3.5 25MM  [employer]  Left 3 Implanted  SCREW LOCKING 3.5X22 - [id number] Screw SCREW LOCKING 3.5X22  [employer]  Left 3 Implanted  SCREW LOCKING 3.5X24 - [id number] Screw SCREW LOCKING 3.5X24  [employer]  Left 1 Implanted  SCREW CORTEX 3.5 24MM - [id number] Screw SCREW CORTEX 3.5 24MM  [employer]  Left 1 Implanted  PLATE LCP 5.726O035 59R - [id number] Plate PLATE LCP 4.680H212 24M  [employer]  Left 1 Implanted     Indications for Surgery: 71 year old female with a history of previous stroke that presents after a fall and a left humeral shaft fracture.  I discussed risks of nonoperative versus operative management.  Risks included but not limited to bleeding, infection, nonunion, malunion, hardware failure, periprosthetic fracture, elbow and shoulder stiffness, nerve or blood vessel injury, DVT, even the possibility anesthetic complications.  She agreed to proceed with surgery after full discussion.  Consent was obtained.  Operative  Findings: Open reduction internal fixation of left humeral shaft fracture using Synthes 3.5 mm LCP with independent 2.7 mm lag screws for the butterfly fragment  Procedure: The patient was identified in the preoperative holding area. Consent was confirmed with the patient and their family and all questions were answered. The operative extremity was marked after confirmation with the patient. she was then brought back to the operating room by our anesthesia colleagues.  She was placed under regional anesthesia and then placed under general anesthetic.  The left upper extremity was then prepped and draped in usual sterile fashion.  A timeout was performed to verify the patient, the procedure, and the extremity.  Preoperative antibiotics were dosed.  Fluoroscopic imaging was used to show the unstable nature of her injury.  An anterior lateral approach to the humeral shaft was made and carried down to through skin and subcutaneous tissue.  I mobilized the cephalic vein and incised through the biceps fascia.  I then mobilized the biceps medially and split the brachialis in line with my incision.  I then exposed the fracture and removed all the fracture hematoma.  I then reduced the large butterfly fragment to the proximal shaft fragment and confirmed that this was anatomic using fluoroscopy.  I then placed a provisional 2.7 mm positional screw to hold this reduction.  I then reduced the distal shaft to the butterfly fragment in the proximal fragment and held it reduced with a reduction tenaculum.  I confirmed reduction and then placed a 2.7 mm nonlocking screw.  Once I had provisional reduction I then contoured an 11 hole Synthes LCP 3.5 mm plate in place and on  the anterior surface of the humerus.  I then drilled nonlocking screws proximal distal to the fracture.  A total of 4 screws were placed proximal and 4 screws were placed distal.  2 locking screws were placed proximally and 2 locking screws were placed  distal.  Final fluoroscopic imaging was then obtained.  The incision was copiously irrigated.  A gram of vancomycin powder was placed into the incision.  A layer closure of 0 Vicryl, 2-0 Vicryl and 3-0 Monocryl Dermabond was used to close the skin.  Sterile dressings were applied.  The patient was then awoken from anesthesia and taken to the PACU in stable condition.  Post Op Plan/Instructions: The patient may use the arm for weightbearing for walker ambulation if needed.  Otherwise I would limit weight lifting for no more than 2 pounds.  She should receive postoperative Ancef.  She will be placed on her anticoagulation postoperative day 1 if her hemoglobin is stable.  We will have her mobilize with physical and Occupational Therapy.  I was present and performed the entire surgery.  Patrecia Pace, PA-C did assist me throughout the case. An assistant was necessary given the difficulty in approach, maintenance of reduction and ability to instrument the fracture.   Katha Hamming, MD [facility]

## 2021-07-20 NOTE — Progress Notes (Signed)
Pt transferred to OR via bed.

## 2021-07-20 NOTE — Transfer of Care (Signed)
Immediate Anesthesia Transfer of Care Note  Patient: Mckenzie Guerrero  Procedure(s) Performed: OPEN REDUCTION INTERNAL FIXATION (ORIF) DISTAL HUMERUS FRACTURE (Left)  Patient Location: PACU  Anesthesia Type:GA combined with regional for post-op pain  Level of Consciousness: drowsy and patient cooperative  Airway & Oxygen Therapy: Patient Spontanous Breathing and Patient connected to nasal cannula oxygen  Post-op Assessment: Report given to RN, Post -op Vital signs reviewed and stable and Patient moving all extremities  Post vital signs: Reviewed and stable  Last Vitals:  Vitals Value Taken Time  BP    Temp    Pulse    Resp    SpO2      Last Pain:  Vitals:   07/20/21 1249  TempSrc:   PainSc: Asleep         Complications: No notable events documented.

## 2021-07-20 NOTE — Interval H&P Note (Signed)
History and Physical Interval Note:  07/20/2021 11:19 AM  Mckenzie Guerrero  has presented today for surgery, with the diagnosis of Left Humerus Fracture.  The various methods of treatment have been discussed with the patient and family. After consideration of risks, benefits and other options for treatment, the patient has consented to  Procedure(s): OPEN REDUCTION INTERNAL FIXATION (ORIF) DISTAL HUMERUS FRACTURE (Left) as a surgical intervention.  The patient's history has been reviewed, patient examined, no change in status, stable for surgery.  I have reviewed the patient's chart and labs.  Questions were answered to the patient's satisfaction.     Lennette Bihari P Giavanni Odonovan

## 2021-07-20 NOTE — Progress Notes (Addendum)
° °  HD#3 SUBJECTIVE:  Patient Summary: Mckenzie Guerrero is a 71 y.o. with a pertinent PMH of HTN, Afib on Eliquis, and recent CVA admitted from 11/28-12/3, who presented with a fall and admitted for a COVID-19 and left humeral head fracture.  Overnight Events: No acute events overnight  Interim History: This is hospital day 3 for Mckenzie Guerrero who was seen and evaluated with her son at the bedside. Patient overall is doing well this morning and does not endorse any current pain.   OBJECTIVE:  Vital Signs: Vitals:   07/19/21 0439 07/19/21 1210 07/19/21 2159 07/20/21 0417  BP: 108/72 113/73 110/72 119/80  Pulse: 74 89 79 82  Resp: 16 18 16 18   Temp: 98 F (36.7 C) (!) 97.5 F (36.4 C) 97.6 F (36.4 C) 98.3 F (36.8 C)  TempSrc: Oral Axillary Axillary Oral  SpO2: 98% 97% 98% 98%  Weight:      Height:       Supplemental O2: Room Air SpO2: 98 % O2 Flow Rate (L/min): 0 L/min  Filed Weights   07/17/21 2155  Weight: 58.9 kg    No intake or output data in the 24 hours ending 07/20/21 0711 Net IO Since Admission: -84.55 mL [07/20/21 0711]  Physical Exam: General: Resting comfortably, in no acute distress. CV: RRR. No murmurs, rubs, or gallops. Pulmonary: Lungs CTAB. Normal effort on room air Extremities: Normal bulk and tone. L arm in sling.  Skin: Warm and dry.  Neuro: Awake, alert. Unable to converse appropriately       ASSESSMENT/PLAN:   Assessment: Principal Problem:   Humerus fracture Active Problems:   HTN (hypertension)   AF (paroxysmal atrial fibrillation) (HCC)   COVID-19 virus infection   Plan: #Left midshaft humerus fracture Fall could have been secondary to gait imbalance from recent CVA vs COVID-19 infection. Initial concern was for new CVA, however, per neuro and radiology, this was ruled out. Anticoagulation was restarted, but stopped today in the setting of upcoming surgery. - ORIF today at 10:30 with ortho - Ancef 2 g: surgical ppx  -  Non-weight bearing LUE - Norco 5-325 mg q4h - Dilaudid 0.5 mg q4h prn - Bowel regimen: scheduled senokot-S and miralax  - PT/OT as able - SNF placement pending   #Paroxysmal Afib #Hypertension Patient remains in normal sinus rhythm. Held home eliquis in the setting of upcoming surgery.  - Continue home metoprolol 12.5 mg daily - Will resume anticoagulation tomorrow  #Recent embolic cerebral infarction Holding anticoagulation as above. Continue Lipitor 40 mg daily.   #COVID-19 Incidentally tested positive on 1/6. Patient remains asympatomatic and is saturating well on room air. No indication for steroids or remdesivir. Little benefit to antivirals at this point.  Best Practice: Diet: NPO IVF: Fluids: none VTE: SCDs Start: 07/17/21 1428 Code: Full AB: Ancef  Therapy Recs: SNF Family Contact: son, at bedside. DISPO: Anticipated discharge  in 1-3 days  to Skilled nursing facility pending Medical stability and placement .  Signature: Buddy Duty, D.O.  Internal Medicine Resident, PGY-1 [facility]  Pager: [phone] 7:11 AM, 07/20/2021   Please contact the on call pager after 5 pm and on weekends at [phone].

## 2021-07-21 ENCOUNTER — Encounter (HOSPITAL_COMMUNITY): Payer: Self-pay | Admitting: Student

## 2021-07-21 DIAGNOSIS — S42202A Unspecified fracture of upper end of left humerus, initial encounter for closed fracture: Secondary | ICD-10-CM | POA: Diagnosis not present

## 2021-07-21 LAB — CBC
HCT: 31.1 % — ABNORMAL LOW (ref 36.0–46.0)
Hemoglobin: 10.2 g/dL — ABNORMAL LOW (ref 12.0–15.0)
MCH: 30.6 pg (ref 26.0–34.0)
MCHC: 32.8 g/dL (ref 30.0–36.0)
MCV: 93.4 fL (ref 80.0–100.0)
Platelets: 317 10*3/uL (ref 150–400)
RBC: 3.33 MIL/uL — ABNORMAL LOW (ref 3.87–5.11)
RDW: 13.2 % (ref 11.5–15.5)
WBC: 12.4 10*3/uL — ABNORMAL HIGH (ref 4.0–10.5)
nRBC: 0 % (ref 0.0–0.2)

## 2021-07-21 LAB — BASIC METABOLIC PANEL
Anion gap: 9 (ref 5–15)
BUN: 14 mg/dL (ref 8–23)
CO2: 21 mmol/L — ABNORMAL LOW (ref 22–32)
Calcium: 8.5 mg/dL — ABNORMAL LOW (ref 8.9–10.3)
Chloride: 108 mmol/L (ref 98–111)
Creatinine, Ser: 0.67 mg/dL (ref 0.44–1.00)
GFR, Estimated: >60 mL/min (ref >60)
Glucose, Bld: 127 mg/dL — ABNORMAL HIGH (ref 70–99)
Potassium: 4.4 mmol/L (ref 3.5–5.1)
Sodium: 138 mmol/L (ref 135–145)

## 2021-07-21 MED ORDER — APIXABAN 5 MG PO TABS
5.0000 mg | ORAL_TABLET | Freq: Two times a day (BID) | ORAL | Status: DC
  Administered 2021-07-21 – 2021-07-28 (×15): 5 mg via ORAL
  Filled 2021-07-21 (×15): qty 1

## 2021-07-21 NOTE — Hospital Course (Addendum)
#  Left midshaft humerus fracture Patient admitted on 11/29 with left MCA infarct with evidence of hemorrhagic transformation and trace right midline shift. Patient admitted to [facility] from 12/3 to 12/20 and was discharged to home after. Prior to admission, patient fell and attempted to catch herself with her left upper extremity, thus sustaining a left midshaft humeral fracture. CT head, cervical spine, DG thoracic/lumbar spine, pelvis, and chest with no acute abnormalities. Initial concern was that this patient sustained a new cerebral infarction, however, after discussion with neuro and radiology, it was determined that MRI findings were consistent with previous embolic infarction. Fall could have been secondary to previous infarction vs COVID infection. Patient had ORIF with ortho on 1/9. She was deemed safe for weightbearing with walker, although recommend minimizing weightbearing with LUE. Will follow up with ortho in 2 weeks from surgery. Patient is also noted to have a wrist drop, consistent with a radial nerve injury from her left midshaft humerus fracture. She was given a wrist splint by OT, and recommend using this as needed. When using the splint, recommend 4 hrs on, and 4 hrs off, to prevent any pressure wounds. Stable for discharge to SNF on 1/17. Of note, the patient developed a radial nerve palsy following ORIF L mid shaft humerus fracture 07/20/21. She has been placed in a cock-up wrist splint while inpatient. Plan to have patient follow-up in 1-2 weeks with Dr. Doreatha Martin in [place] [facility] for re-evaluation and we will continue monitor the recovery of the radial nerve. The patient may require a splint be fabricated as an outpatient after assessed at her return ortho visit. Pt to be assessed for a radial nerve palsy splint as indicated after follow up with orthopedics  #Paroxysmal Afib on Eliquis #Hypertension  Patient remained in normal sinus rhythm during admission. Resumed anticoagulation with eliquis 5 mg  bid after surgery and continued metoprolol 12.5 mg daily.   #Recent embolic cerebral infarction Anticoagulation with eliquis 5 mg bid as noted above. Continued lipitor 40 mg daily  #COVID-19  Tested positive on 1/6. Remained asymptomatic, and saturated well on room air. There was little benefit for starting antivirals or steroids at this point.

## 2021-07-21 NOTE — Anesthesia Postprocedure Evaluation (Signed)
Anesthesia Post Note  Patient: Mckenzie Guerrero  Procedure(s) Performed: OPEN REDUCTION INTERNAL FIXATION (ORIF) DISTAL HUMERUS FRACTURE (Left)     Patient location during evaluation: PACU Anesthesia Type: Regional and General Level of consciousness: awake and alert Pain management: pain level controlled Vital Signs Assessment: post-procedure vital signs reviewed and stable Respiratory status: spontaneous breathing, nonlabored ventilation, respiratory function stable and patient connected to nasal cannula oxygen Cardiovascular status: blood pressure returned to baseline and stable Postop Assessment: no apparent nausea or vomiting Anesthetic complications: no   No notable events documented.  Last Vitals:  Vitals:   07/20/21 2126 07/21/21 0500  BP: (!) 147/74 130/74  Pulse: 96 92  Resp: 18 16  Temp: 36.5 C 36.5 C  SpO2: 96% 96%    Last Pain:  Vitals:   07/21/21 0500  TempSrc: Axillary  PainSc:                  [place]

## 2021-07-21 NOTE — Progress Notes (Signed)
Physical Therapy Treatment Patient Details Name: Mckenzie Guerrero MRN: [id number] DOB: Aug 07, 1950 Today's Date: 07/21/2021   History of Present Illness 71 y.o. female s/p fall and admission 1/6 with L humerus fx, s/p ORIF 1/9.[facility] MRI + new areas of acute CVA in L MCA territory. + for COVID.  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    PT Comments    Pt now with L UE ORIF. Pt remains to have expressive aphasia making communication difficulty but did follow commands majority of time with R UE and for gross mobility ie initiating transfers. Pt with significant posterior bias upon standing and during std pvt to chair limiting ability to ambulation. Recommend SNF upon d/c to allow for maximal functional recovery to achieve safe level of function for family to care for her.   Recommendations for follow up therapy are one component of a multi-disciplinary discharge planning process, led by the attending physician.  Recommendations may be updated based on patient status, additional functional criteria and insurance authorization.  Follow Up Recommendations  Skilled nursing-short term rehab (<3 hours/day)     Assistance Recommended at Discharge Frequent or constant Supervision/Assistance  Patient can return home with the following A lot of help with walking and/or transfers;A little help with bathing/dressing/bathroom;Assistance with cooking/housework;Assist for transportation;Help with stairs or ramp for entrance   Equipment Recommendations   (TBD at next facility)    Recommendations for Other Services       Precautions / Restrictions Precautions Precautions: Fall Precaution Comments: Expressive > receptive aphasia, R inattention Required Braces or Orthoses: Sling (sling for comfort to L UE) Restrictions Weight Bearing Restrictions: Yes LUE Weight Bearing: Weight bearing as  tolerated Other Position/Activity Restrictions: WBAT for RW only, sling for comfort, cannot lift more than 2lbs. no ROM restrictions     Mobility  Bed Mobility Overal bed mobility: Needs Assistance Bed Mobility: Supine to Sit;Rolling Rolling: Min assist   Supine to sit: Mod assist;+2 for physical assistance     General bed mobility comments: pt did initiate rolling for hygiene due to pt incontinent of stool, modAx2 for trunk elevation and to scoot to EOB    Transfers Overall transfer level: Needs assistance Equipment used: 2 person hand held assist Transfers: Sit to/from Stand Sit to Stand: Mod assist;+2 physical assistance           General transfer comment: pt powered up into standing with max verbal and tactile cues, pt with posterior bias but did advance LEs towards chair to complete transfer    Ambulation/Gait               General Gait Details: unable due to significant posterior bias and inability to follow commands to proceed forward   Stairs             Wheelchair Mobility    Modified Rankin (Stroke Patients Only)       Balance Overall balance assessment: History of Falls;Needs assistance Sitting-balance support: Feet supported;Single extremity supported Sitting balance-Leahy Scale: Fair Sitting balance - Comments: needs occasional UE support sitting EOB   Standing balance support: During functional activity;Single extremity supported Standing balance-Leahy Scale: Poor Standing balance comment: posterior lean                            Cognition Arousal/Alertness: Awake/alert Behavior During Therapy: Restless;Anxious Overall Cognitive Status: Difficult to assess  General Comments: apraxia noted, pt using flat side of toothbrush to brush teeth vs. side with bristles, pt with simple command follow 50% of time, unable to express needs, didn't follow commands with L UE         Exercises      General Comments General comments (skin integrity, edema, etc.): L UE in ace wrap and sling, pt found with incontinence of stool, dependent for hygiene      Pertinent Vitals/Pain Pain Assessment: Faces Faces Pain Scale: Hurts little more Breathing: normal Negative Vocalization: occasional moan/groan, low speech, negative/disapproving quality Facial Expression: smiling or inexpressive Body Language: tense, distressed pacing, fidgeting Consolability: no need to console PAINAD Score: 2 Pain Location: LUE with movement Pain Descriptors / Indicators: Guarding;Grimacing;Moaning Pain Intervention(s): Monitored during session    Home Living                          Prior Function            PT Goals (current goals can now be found in the care plan section) Acute Rehab PT Goals Patient Stated Goal: unable to state PT Goal Formulation: With family Time For Goal Achievement: 08/01/21 Potential to Achieve Goals: Fair Progress towards PT goals: Progressing toward goals    Frequency    Min 3X/week      PT Plan Current plan remains appropriate    Co-evaluation PT/OT/SLP Co-Evaluation/Treatment: Yes Reason for Co-Treatment: Complexity of the patient's impairments (multi-system involvement) PT goals addressed during session: Mobility/safety with mobility        AM-PAC PT "6 Clicks" Mobility   Outcome Measure  Help needed turning from your back to your side while in a flat bed without using bedrails?: A Little Help needed moving from lying on your back to sitting on the side of a flat bed without using bedrails?: A Little Help needed moving to and from a bed to a chair (including a wheelchair)?: A Lot Help needed standing up from a chair using your arms (e.g., wheelchair or bedside chair)?: A Lot Help needed to walk in hospital room?: A Lot Help needed climbing 3-5 steps with a railing? : Total 6 Click Score: 13    End of Session Equipment  Utilized During Treatment: Gait belt Activity Tolerance: Patient limited by fatigue;Patient limited by pain Patient left: with call bell/phone within reach;in chair;with chair alarm set Nurse Communication: Mobility status PT Visit Diagnosis: Unsteadiness on feet (R26.81);Muscle weakness (generalized) (M62.81);Pain Pain - Right/Left: Left Pain - part of body: Arm     Time: 1610-9604 PT Time Calculation (min) (ACUTE ONLY): 24 min  Charges:  $Therapeutic Activity: 8-22 mins                     Kittie Plater, PT, DPT Acute Rehabilitation Services Pager #: [phone] Office #: [phone]    Berline Lopes 07/21/2021, 3:10 PM

## 2021-07-21 NOTE — Progress Notes (Signed)
Orthopaedic Trauma Progress Note  SUBJECTIVE: Patient nonverbal. Moving the arm freely. Does not appear in pain  OBJECTIVE:  Vitals:   07/20/21 2126 07/21/21 0500  BP: (!) 147/74 130/74  Pulse: 96 92  Resp: 18 16  Temp: 97.7 F (36.5 C) 97.7 F (36.5 C)  SpO2: 96% 96%    General: NAD Respiratory: No increased work of breathing.  LUE: Dressing CDI. Sling in place. Moving arm freely in bed. Difficulty obtaining reliable motor or sensory exam due to mental status.   IMAGING: Stable post op imaging.   LABS:  Results for orders placed or performed during the hospital encounter of 07/17/21 (from the past 24 hour(s))  VITAMIN D 25 Hydroxy (Vit-D Deficiency, Fractures)     Status: None   Collection Time: 07/20/21  6:50 PM  Result Value Ref Range   Vit D, 25-Hydroxy 55.53 30 - 100 ng/mL  CBC     Status: Abnormal   Collection Time: 07/21/21  6:05 AM  Result Value Ref Range   WBC 12.4 (H) 4.0 - 10.5 K/uL   RBC 3.33 (L) 3.87 - 5.11 MIL/uL   Hemoglobin 10.2 (L) 12.0 - 15.0 g/dL   HCT 31.1 (L) 36.0 - 46.0 %   MCV 93.4 80.0 - 100.0 fL   MCH 30.6 26.0 - 34.0 pg   MCHC 32.8 30.0 - 36.0 g/dL   RDW 13.2 11.5 - 15.5 %   Platelets 317 150 - 400 K/uL   nRBC 0.0 0.0 - 0.2 %  Basic metabolic panel     Status: Abnormal   Collection Time: 07/21/21  6:05 AM  Result Value Ref Range   Sodium 138 135 - 145 mmol/L   Potassium 4.4 3.5 - 5.1 mmol/L   Chloride 108 98 - 111 mmol/L   CO2 21 (L) 22 - 32 mmol/L   Glucose, Bld 127 (H) 70 - 99 mg/dL   BUN 14 8 - 23 mg/dL   Creatinine, Ser 0.67 0.44 - 1.00 mg/dL   Calcium 8.5 (L) 8.9 - 10.3 mg/dL   GFR, Estimated >60 >60 mL/min   Anion gap 9 5 - 15    ASSESSMENT: Mckenzie Guerrero is a 71 y.o. female, 1 Day Post-Op s/p ORIF LEFT MIDSHAFT HUMERUS FRACTURE  CV/Blood loss: Acute blood loss anemia, Hgb 10.2 this morning. Hemodynamically stable  PLAN: Weightbearing:  WBAT for walker ambulation. Otherwise no lifting greater than 2 lb Incisional and  dressing care: OK to remove dressings 07/22/09 and leave open to air with dry gauze PRN  Showering: Ok to begin showering 07/23/20 with assistance Orthopedic device(s):  Sling LUE as needed for comfort   Pain management:  1. Tylenol 325-650 mg q 6 hours PRN 2. Norco 5-325 mg q 4 hours PRN moderate pain 3. Norco 7.5-325 mg q 4 hours PRN severe pain 4. Neurontin 100 mg TID 5. Dilaudid 0.5-1 mg q 4 hours PRN VTE prophylaxis:  Eliquis , SCDs ID:  Ancef 2gm post op Foley/Lines: No foley, KVO IVFs Impediments to Fracture Healing: Vit D level 55, no supplementation needed Dispo: PT/OT eval today. Dispo pending.  Okay for discharge from ortho standpoint once cleared by medicine team and therapies Follow - up plan: 2 weeks  Contact information:  Katha Hamming MD, Rushie Nyhan PA-C. After hours and holidays please check [email or web address] for group call information for [facility]   Gwinda Passe, PA-C [phone] (office) [email or web address]

## 2021-07-21 NOTE — Progress Notes (Signed)
° °  HD#4 SUBJECTIVE:  Patient Summary: Mckenzie Guerrero is a 71 y.o. with a pertinent PMH of HTN, Afib on Eliquis, and recent CVA admitted from 11/28-12/3, who presented with a fall and admitted for a COVID-19 and left midshaft humerus fracture s/p ORIF.   Overnight Events: No acute events overnight.  Interim History: This is hospital day 4 for Mckenzie Guerrero who was seen and evaluated at the bedside this morning. She does not endorse any pain, however, she appears agitated as she is trying to get out of bed this morning.   OBJECTIVE:  Vital Signs: Vitals:   07/20/21 1615 07/20/21 1644 07/20/21 2126 07/21/21 0500  BP: 130/72 (!) 116/94 (!) 147/74 130/74  Pulse: 60 74 96 92  Resp: 15 14 18 16   Temp: (!) 97 F (36.1 C) (!) 97.4 F (36.3 C) 97.7 F (36.5 C) 97.7 F (36.5 C)  TempSrc:  Oral Oral Axillary  SpO2: 95% 93% 96% 96%  Weight:      Height:       Supplemental O2: Room Air SpO2: 96 %  Filed Weights   07/17/21 2155  Weight: 58.9 kg     Intake/Output Summary (Last 24 hours) at 07/21/2021 0947 Last data filed at 07/20/2021 2131 Gross per 24 hour  Intake 750 ml  Output 800 ml  Net -50 ml   Net IO Since Admission: -134.55 mL [07/21/21 0625]  Physical Exam: General: In no acute distress.  CV: RRR. No murmurs Pulmonary: Lungs CTAB. Normal work of breathing on room air Abdominal: Soft, nontender, nondistended. Normal bowel sounds. Extremities: Palpable radial and DP pulses. Normal ROM. L arm remains in sling, dressing is clean/dry/intact.  Skin: Warm and dry.  Neuro: Awake, alert. Unable to converse appropriately 2/2 expressive aphasia      ASSESSMENT/PLAN:  Assessment: Principal Problem:   Humerus fracture Active Problems:   HTN (hypertension)   AF (paroxysmal atrial fibrillation) (HCC)   COVID-19 virus infection   Plan: #Left midshaft humerus fracture s/p ORIF Patient is POD 1 following ORIF with ortho and she received post-op ancef. Patient may use arm  for weightbearing with walker for ambulation, if needed, otherwise is recommended to limit weight bearing to no more than 2 lbs.  - Will require ortho follow up in 2 weeks - Tylenol 325-650 mg q6h prn for mild pain - Norco 5-325 mg q4h prn for moderate pain - Norco 7.5-325 mg q4h for severe pain - Dilaudid 0.5-1 mg q4h prn for breakthrough pain - Scheduled senokot-S and miralax for bowel regimen - PT/OT as able - SNF placement on discharge  #Paroxysmal Afib on Eliquis Patient remains in NSR. Will resume anticoagulation with eliquis 5 mg bid and will continue metoprolol 12.5 mg daily.  #Recent embolic cerebral infarction Resumed anticoagulation as above. Continuing lipitor 40 mg daily  #COVID-19  Tested positive on 1/6. Remains asymptomatic, and saturating well on room air. There is little benefit for starting antivirals or steroids at this point.   Best Practice: Diet: Heart healthy IVF: Fluids: none VTE: SCDs Start: 07/20/21 1637 SCDs Start: 07/17/21 1428 Code: Full AB: none Therapy Recs: SNF Family Contact: Son, [name], to be notified. DISPO: Anticipated discharge to Skilled nursing facility pending Medical stability and placement .  Signature: Buddy Duty, D.O.  Internal Medicine Resident, PGY-1 [facility]  Pager: [phone] 6:25 AM, 07/21/2021   Please contact the on call pager after 5 pm and on weekends at [phone].

## 2021-07-21 NOTE — Plan of Care (Signed)
°  Problem: Clinical Measurements: Goal: Will remain free from infection Outcome: Progressing   Problem: Activity: Goal: Risk for activity intolerance will decrease Outcome: Progressing   Problem: Nutrition: Goal: Adequate nutrition will be maintained Outcome: Progressing   Problem: Coping: Goal: Level of anxiety will decrease Outcome: Progressing   Problem: Elimination: Goal: Will not experience complications related to bowel motility Outcome: Progressing

## 2021-07-21 NOTE — Progress Notes (Signed)
Speech Language Pathology Treatment: Dysphagia;Cognitive-Linquistic  Patient Details Name: Mckenzie Guerrero MRN: [id number] DOB: 25-Jan-1951 Today's Date: 07/21/2021 Time: 1203-1220 SLP Time Calculation (min) (ACUTE ONLY): 17 min  Assessment / Plan / Recommendation Clinical Impression  SWALLOWING Pt's diet was changed to a regular texture diet.  Today, pt's swallow function was reassessed.  There were no overt s/s of aspiration with any consistencies trialed.  With solid textures there was prolonged oral phase, multiple swallows, and oral residue R>L.  Oral residuals increased with advancing textures.  Recommend resuming dysphagia 2 (ground/chopped) diet and continuing thin liquids.  COMMUNICATION Pt continues to present with severe receptive and expressive language impairments.  Pt answered yes no questions for biographical information and immediate environment with 0% accuracy.  Pt followed directions with 40% accuracy independently, increasing to 60% with visual and tactile cuing.  Pt identified objects from a field of 2 with 0 percent accuracy.  Pt attempted to participate in rote speech tasks with therapist.  Pt completed choral counting task with 20-40% accuracy.  With singing, pt appeared to attempt to join, but target phonemes were not heard.  Pt maintained rhythm fairly well and perhaps some melodic intonation.     HPI HPI: Mckenzie Guerrero is a 71 yo female with HTN, Afib on Eliquis, and recent CVA (admitted 11/28-12/3; at [facility] from 12/3-12/20) who presented to the hospital after a fall.      The patient has non-intelligible speech at baseline secondary to her recent CVA, so history was obtained from her son, Mckenzie Guerrero, who was at the bedside. He states that this morning the patient fell while she was in her bathroom. He is unsure if she lost consciousness, but states that she called out to her boyfriend who helped her stand up after and brought her to the ED. The patient has had difficulties with  balance and gait over the last few days. Of note, she was recently discharged from [facility] on 12/20 following rehabilitation for a left MCA infarction that left her with right sided deficits. She attempted to catch herself during the fall with her left arm and was found sitting on the ground by her boyfriend. The patient endorses significant pain in her left arm and is unable to move it now; 07/17/21 MRI brain indicated Although there are some conspicuous areas of definitive diffusion  restriction (adjacent to the left frontal horn series 2, image 31),  on close inspection with the 06/09/2021 DWI the parenchyma in that  same location seem to be restricted before. And I discussed with Dr.  Erlinda Guerrero that I have occasionally seen infarct cases with prolonged  diffusion restriction (sometimes a year or more) which I presume is  due to some type of viscous necrosis of the infarcted tissue.  Superimposed laminar necrosis and petechial hemorrhage also seen in  other areas of the chronic ischemia as reported by Mckenzie Guerrero.  SLE/BSE generated      SLP Plan  Continue with current plan of care      Recommendations for follow up therapy are one component of a multi-disciplinary discharge planning process, led by the attending physician.  Recommendations may be updated based on patient status, additional functional criteria and insurance authorization.    Recommendations  Diet recommendations: Dysphagia 2 (fine chop);Thin liquid Liquids provided via: Straw;Cup Medication Administration: Crushed with puree Supervision: Staff to assist with self feeding Compensations: Slow rate;Small sips/bites;Minimize environmental distractions Postural Changes and/or Swallow Maneuvers: Seated upright 90 degrees  Oral Care Recommendations: Oral care BID;Staff/trained caregiver to provide oral care Follow Up Recommendations: Skilled nursing-short term rehab (<3 hours/day) Assistance recommended at discharge: Frequent or  constant Supervision/Assistance SLP Visit Diagnosis: Aphasia (R47.01);Dysphagia, oropharyngeal phase (R13.12) Plan: Continue with current plan of care           Mckenzie Guerrero , [place], [place] Office: [phone]  07/21/2021, 12:28 PM

## 2021-07-21 NOTE — TOC CAGE-AID Note (Signed)
Transition of Care [facility]) - CAGE-AID Screening   Patient Details  Name: KAMREN HESKETT MRN: [id number] Date of Birth: [birth year]  Transition of Care [facility]) CM/SW Contact:    Romie Tay C Tarpley-Carter, [place] Phone Number: 07/21/2021, 12:40 PM   Clinical Narrative: Pt participated in [place].  Pt stated she does not use substance or ETOH.  Pt was not offered resources, due to no usage of substance or ETOH.    Donnalynn Wheeless Tarpley-Carter, MSW, LCSW-A Pronouns:  She/Her/Hers [place] Transitions of Care Clinical Social Worker Direct Number:  [phone] [email or web address]  CAGE-AID Screening:    Have You Ever Felt You Ought to Cut Down on Your Drinking or Drug Use?: No Have People Annoyed You By [employer] Your Drinking Or Drug Use?: No Have You Felt Bad Or Guilty About Your Drinking Or Drug Use?: No Have You Ever Had a Drink or Used Drugs First Thing In The Morning to Steady Your Nerves or to Get Rid of a Hangover?: No CAGE-AID Score: 0  Substance Abuse Education Offered: No

## 2021-07-21 NOTE — Evaluation (Addendum)
Occupational Therapy Evaluation Patient Details Name: Mckenzie Guerrero MRN: [id number] DOB: 1950/10/18 Today's Date: 07/21/2021   History of Present Illness 71 y.o. female s/p fall and admission 1/6 with L humerus fx, s/p ORIF 1/9.[facility] Kitchen MRI + new areas of acute CVA in L MCA territory. + for COVID.  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   Clinical Impression   Per chart review, pt requires increased assistance at home with ADLs and when navigating stairs, walks without AD but is unsafe at times per boyfriend. Pt currently min-max A for ADLs, min-mod A +2 for bed mobility, and min-mod A +2 for transfers, demonstrates posterior lean in sitting and standing. Pt L hand/wrist AROM WFL given bandage, elbow AAROM WFL with Mod A support. Pt follows single step commands with increased time, requires step-by-step cuing for oral care, demonstrates apraxia using back side of toothbrush to brush teeth instead of bristles. Pt presenting with impairments listed below, will continue to follow acutely. Recommend SNF at d/c.     Recommendations for follow up therapy are one component of a multi-disciplinary discharge planning process, led by the attending physician.  Recommendations may be updated based on patient status, additional functional criteria and insurance authorization.   Follow Up Recommendations  Skilled nursing-short term rehab (<3 hours/day)    Assistance Recommended at Discharge Frequent or constant Supervision/Assistance  Patient can return home with the following A little help with walking and/or transfers;A lot of help with bathing/dressing/bathroom;Assistance with cooking/housework;Direct supervision/assist for financial management;Direct supervision/assist for medications management;Assist for transportation;Help with stairs or ramp for entrance    Functional Status Assessment  Patient  has had a recent decline in their functional status and demonstrates the ability to make significant improvements in function in a reasonable and predictable amount of time.  Equipment Recommendations  None recommended by OT;Other (comment) (defer to next venue of care)    Recommendations for Other Services PT consult;Speech consult     Precautions / Restrictions Precautions Precautions: Fall Precaution Comments: Expressive > receptive aphasia, R inattention Required Braces or Orthoses: Sling (sling for comfort) Restrictions Weight Bearing Restrictions: Yes LUE Weight Bearing: Weight bearing as tolerated Other Position/Activity Restrictions: WBAT for RW only, sling for comfort, cannot lift more than 2lbs. no ROM restrictions      Mobility Bed Mobility Overal bed mobility: Needs Assistance Bed Mobility: Supine to Sit;Sit to Supine     Supine to sit: Min assist;Mod assist;+2 for physical assistance          Transfers Overall transfer level: Needs assistance Equipment used: 2 person hand held assist Transfers: Sit to/from Stand Sit to Stand: Min assist;Mod assist;+2 physical assistance                  Balance Overall balance assessment: History of Falls;Needs assistance Sitting-balance support: Feet supported;Single extremity supported Sitting balance-Leahy Scale: Fair Sitting balance - Comments: needs occasional UE support sitting EOB   Standing balance support: During functional activity;Single extremity supported Standing balance-Leahy Scale: Poor Standing balance comment: posterior lean                           ADL either performed or assessed with clinical judgement   ADL Overall ADL's : Needs assistance/impaired Eating/Feeding: Minimal assistance;Cueing for safety;Cueing for sequencing   Grooming: Moderate assistance;Sitting;Cueing for safety;Cueing for sequencing;Oral care Grooming Details (indicate cue type and  reason): apraxic, requires  increased cuing and hand over hand assist to use toothbrush appropriately. Upper Body Bathing: Moderate assistance;Sitting   Lower Body Bathing: Maximal assistance   Upper Body Dressing : Minimal assistance;Bed level Upper Body Dressing Details (indicate cue type and reason): dons new gown lying supine in bed Lower Body Dressing: Maximal assistance   Toilet Transfer: Minimal assistance;+2 for physical assistance;Moderate assistance;Stand-pivot;BSC/3in1 Armed forces technical officer Details (indicate cue type and reason): posterior lean Toileting- Clothing Manipulation and Hygiene: Maximal assistance;+2 for physical assistance;Bed level Toileting - Clothing Manipulation Details (indicate cue type and reason): bed level to perform pericare rolling R/L     Functional mobility during ADLs: Minimal assistance;Moderate assistance       Vision   Vision Assessment?: Vision impaired- to be further tested in functional context     Perception     Praxis      Pertinent Vitals/Pain Pain Assessment: Faces Pain Score: 2  Faces Pain Scale: Hurts a little bit Pain Location: LUE Pain Descriptors / Indicators: Guarding;Grimacing;Moaning Pain Intervention(s): Limited activity within patient's tolerance;Monitored during session;Premedicated before session;Repositioned     Hand Dominance Right   Extremity/Trunk Assessment Upper Extremity Assessment Upper Extremity Assessment: LUE deficits/detail LUE Deficits / Details: hand/wrist PROM WFL given bandage/splint, pt requiring AAROM for elbow ROM, WFL LUE Coordination: decreased gross motor   Lower Extremity Assessment Lower Extremity Assessment: Defer to PT evaluation   Cervical / Trunk Assessment Cervical / Trunk Assessment: Normal   Communication Communication Communication: Expressive difficulties;Receptive difficulties   Cognition Arousal/Alertness: Awake/alert Behavior During Therapy: Restless;Anxious Overall Cognitive Status: Difficult to  assess                                       General Comments  pt guarding LUE throughout session, appeared anxious throughoout session    Exercises     Shoulder Instructions      Home Living Family/patient expects to be discharged to:: Private residence Living Arrangements: Children Available Help at Discharge: Family;Friend(s);Available PRN/intermittently Type of Home: House Home Access: Stairs to enter Entrance Stairs-Number of Steps: 1 Entrance Stairs-Rails: None Home Layout: Two level;Multi-level Alternate Level Stairs-Number of Steps: flight Alternate Level Stairs-Rails: Right Bathroom Shower/Tub: Occupational psychologist: Standard Bathroom Accessibility: No   Home Equipment: None   Additional Comments: information obtained from chart reveiw  Lives With: Other (Comment) (alone with boyfriend/son assisting since d/c from [facility])    Prior Functioning/Environment Prior Level of Function : Needs assist  Cognitive Assist : Mobility (cognitive) Mobility (Cognitive): Intermittent cues ADLs (Cognitive): Step by step cues Physical Assist : Mobility (physical) Mobility (physical): Stairs   Mobility Comments: was walking with no AD but was beginning to be unsafe per significant other ADLs Comments: pt had begun to require help to do self care        OT Problem List: Decreased strength;Decreased range of motion;Decreased activity tolerance;Decreased coordination;Decreased cognition;Decreased safety awareness;Impaired UE functional use;Pain;Increased edema;Decreased knowledge of precautions;Impaired balance (sitting and/or standing)      OT Treatment/Interventions: Self-care/ADL training;Therapeutic exercise;Neuromuscular education;DME and/or AE instruction;Therapeutic activities;Patient/family education;Balance training    OT Goals(Current goals can be found in the care plan section) Acute Rehab OT Goals Patient Stated Goal: per chart review, family  wants pt to go to SNF OT Goal Formulation: With family Time For Goal Achievement: 08/04/21 Potential to Achieve Goals: Good ADL Goals Pt Will Perform Upper Body Dressing: with min guard  assist;sitting Pt Will Transfer to Toilet: with min assist;bedside commode;stand pivot transfer Pt Will Perform Toileting - Clothing Manipulation and hygiene: with mod assist;sitting/lateral leans  OT Frequency: Min 2X/week    Co-evaluation PT/OT/SLP Co-Evaluation/Treatment: Yes Reason for Co-Treatment: Complexity of the patient's impairments (multi-system involvement);For patient/therapist safety;To address functional/ADL transfers;Necessary to address cognition/behavior during functional activity   OT goals addressed during session: ADL's and self-care      AM-PAC OT "6 Clicks" Daily Activity     Outcome Measure Help from another person eating meals?: A Little Help from another person taking care of personal grooming?: A Lot Help from another person toileting, which includes using toliet, bedpan, or urinal?: Total Help from another person bathing (including washing, rinsing, drying)?: Total Help from another person to put on and taking off regular upper body clothing?: A Lot Help from another person to put on and taking off regular lower body clothing?: A Lot 6 Click Score: 11   End of Session Equipment Utilized During Treatment: Gait belt Nurse Communication: Mobility status;Other (comment) (RN notified of meds found in pt's bed)  Activity Tolerance: Patient tolerated treatment well Patient left: in chair;with call bell/phone within reach;with chair alarm set  OT Visit Diagnosis: Unsteadiness on feet (R26.81);Muscle weakness (generalized) (M62.81);History of falling (Z91.81);Apraxia (R48.2);Other symptoms and signs involving cognitive function;Other symptoms and signs involving the nervous system (R29.898);Cognitive communication deficit (R41.841);Pain Pain - Right/Left: Left Pain - part of body:  Arm;Shoulder                Time: 1035-1105 OT Time Calculation (min): 30 min Charges:  OT General Charges $OT Visit: 1 Visit OT Evaluation $OT Eval Moderate Complexity: 1 [address] OTD, OTR/L Acute Rehab [phone] [place] 07/21/2021, 12:13 PM

## 2021-07-22 DIAGNOSIS — S42202A Unspecified fracture of upper end of left humerus, initial encounter for closed fracture: Secondary | ICD-10-CM | POA: Diagnosis not present

## 2021-07-22 LAB — CBC
HCT: 29.1 % — ABNORMAL LOW (ref 36.0–46.0)
Hemoglobin: 9.4 g/dL — ABNORMAL LOW (ref 12.0–15.0)
MCH: 30.1 pg (ref 26.0–34.0)
MCHC: 32.3 g/dL (ref 30.0–36.0)
MCV: 93.3 fL (ref 80.0–100.0)
Platelets: 379 10*3/uL (ref 150–400)
RBC: 3.12 MIL/uL — ABNORMAL LOW (ref 3.87–5.11)
RDW: 13.3 % (ref 11.5–15.5)
WBC: 15.8 10*3/uL — ABNORMAL HIGH (ref 4.0–10.5)
nRBC: 0 % (ref 0.0–0.2)

## 2021-07-22 LAB — BASIC METABOLIC PANEL
Anion gap: 9 (ref 5–15)
BUN: 10 mg/dL (ref 8–23)
CO2: 21 mmol/L — ABNORMAL LOW (ref 22–32)
Calcium: 8.6 mg/dL — ABNORMAL LOW (ref 8.9–10.3)
Chloride: 108 mmol/L (ref 98–111)
Creatinine, Ser: 0.59 mg/dL (ref 0.44–1.00)
GFR, Estimated: >60 mL/min (ref >60)
Glucose, Bld: 94 mg/dL (ref 70–99)
Potassium: 4 mmol/L (ref 3.5–5.1)
Sodium: 138 mmol/L (ref 135–145)

## 2021-07-22 MED ORDER — HYDROCODONE-ACETAMINOPHEN 5-325 MG PO TABS: 1.0000 | ORAL_TABLET | Freq: Four times a day (QID) | ORAL | 0 refills | Status: DC | PRN

## 2021-07-22 MED ORDER — ACETAMINOPHEN 500 MG PO TABS
1000.0000 mg | ORAL_TABLET | Freq: Once | ORAL | Status: AC
  Administered 2021-07-22: 1000 mg via ORAL
  Filled 2021-07-22: qty 2

## 2021-07-22 NOTE — Progress Notes (Signed)
° °  HD#5 SUBJECTIVE:  Patient Summary: Mckenzie Guerrero is a 71 y.o. with a pertinent PMH of HTN, Afib on Eliquis, and recent CVA admitted from 11/28-12/3, who presented with a fall and admitted for a COVID-19 and left midshaft humerus fracture s/p ORIF on 1/9.   Overnight Events: No acute events overnight  Interim History: This is hospital day 5 for Mckenzie Guerrero who was seen and evaluated at the bedside this morning. Patient does not appear to be in distress, although she is not able to verbally interact with the team. She is able to move her left arm on her own today.   OBJECTIVE:  Vital Signs: Vitals:   07/21/21 0500 07/21/21 1314 07/21/21 2047 07/22/21 0428  BP: 130/74 118/75 (!) 104/92 (!) 146/86  Pulse: 92 75 100 (!) 105  Resp: 16 16 14    Temp: 97.7 F (36.5 C) 97.7 F (36.5 C) 98.2 F (36.8 C) 98.1 F (36.7 C)  TempSrc: Axillary Oral Oral Oral  SpO2: 96% 98% 96% 98%  Weight:      Height:       Supplemental O2: Room Air SpO2: 98 %  Filed Weights   07/17/21 2155  Weight: 58.9 kg     Intake/Output Summary (Last 24 hours) at 07/22/2021 0659 Last data filed at 07/21/2021 2245 Gross per 24 hour  Intake --  Output 1100 ml  Net -1100 ml   Net IO Since Admission: -1,234.55 mL [07/22/21 0659]  Physical Exam: General: Resting comfortably in bed, in no acute distress CV: RRR. No murmurs Pulmonary: Lungs CTAB. Normal effort. Abdominal: Soft, nontender, nondistended.  Extremities: L arm in sling with postop dressing clean/dry/intact. Able to move L arm and fingers  Skin: Warm and dry.  Neuro: Awake, alert. Expressive aphasia     ASSESSMENT/PLAN:  Assessment: Principal Problem:   Humerus fracture Active Problems:   HTN (hypertension)   AF (paroxysmal atrial fibrillation) (HCC)   COVID-19 virus infection   Plan: #Left midshaft humerus fracture s/p ORIF on 1/9 Patient is POD 2 following ORIF with ortho. Patient is able to move her left arm today and she can  weight bear with that arm if needed for ambulation with her walker. Continue LUE sling as needed for comfort. Will have post-op follow up with ortho in 2 weeks. - Tylenol 325-650 mg q6h prn for mild pain - Norco 5-325 mg q4h prn for moderate pain - Norco 7.5-325 mg q4h for severe pain - Dilaudid 0.5-1 mg q4h prn for breakthrough pain  #Acute blood loss anemia Hb 11.2 prior to surgery and has been downtrending to 9.4 this morning. No active signs of bleeding noted on exam. Will check iron studies in the morning.  - Daily CBC - Iron studies pending  #Paroxysmal Afib Remains in NSR. Anticoagulation with eliquis 5 mg bid. Continue metoprolol 12.5 mg daily.  #COVID-19 Positive on 1/6, although patient remains asymptomatic and not requiring any supplemental O2.   Best Practice: Diet: Cardiac diet IVF: none VTE: SCDs Start: 07/20/21 1637 SCDs Start: 07/17/21 1428 Code: Full AB: none Therapy Recs: SNF, DME: none Family Contact: Son, [name], to be notified. DISPO: Anticipated discharge  in 1-3 days  to Skilled nursing facility pending  placement .  Signature: Buddy Duty, D.O.  Internal Medicine Resident, PGY-1 [facility]  Pager: [phone] 6:59 AM, 07/22/2021   Please contact the on call pager after 5 pm and on weekends at [phone].

## 2021-07-22 NOTE — TOC Progression Note (Signed)
Transition of Care [facility]) - Progression Note    Patient Details  Name: Mckenzie Guerrero MRN: [id number] Date of Birth: February 12, 1951  Transition of Care [facility]) CM/SW Contact  Joanne Chars, LCSW Phone Number: 07/22/2021, 10:30 AM  Clinical Narrative:   CSW spoke with [facility].  [facility] in [place] is out of network with [facility], but [place] is in network.    Expected Discharge Plan: [place] Barriers to Discharge: Continued Medical Work up, Ship broker, SNF Pending bed offer  Expected Discharge Plan and Services Expected Discharge Plan: [place] In-house Referral: Clinical Social Work                                             Social Determinants of Health (SDOH) Interventions    Readmission Risk Interventions No flowsheet data found.

## 2021-07-22 NOTE — Plan of Care (Signed)
°  Problem: Education: Goal: Knowledge of General Education information will improve Description: Including pain rating scale, medication(s)/side effects and non-pharmacologic comfort measures Outcome: Progressing   Problem: Health Behavior/Discharge Planning: Goal: Ability to manage health-related needs will improve Outcome: Progressing   Problem: Clinical Measurements: Goal: Ability to maintain clinical measurements within normal limits will improve Outcome: Progressing Goal: Cardiovascular complication will be avoided Outcome: Progressing   Problem: Activity: Goal: Risk for activity intolerance will decrease Outcome: Progressing   Problem: Nutrition: Goal: Adequate nutrition will be maintained Outcome: Progressing   

## 2021-07-22 NOTE — Progress Notes (Signed)
Orthopaedic Trauma Progress Note  SUBJECTIVE: Patient more interactive today. Moving arm on her own. Denies any significant pain. Pain controlled with Norco. Friend at bedside  OBJECTIVE:  Vitals:   07/22/21 0428 07/22/21 0827  BP: (!) 146/86 (!) 146/91  Pulse: (!) 105 (!) 101  Resp:  18  Temp: 98.1 F (36.7 C) 98.6 F (37 C)  SpO2: 98% 98%    General: Sitting up in bedside chair. NAD Respiratory: No increased work of breathing.  LUE: Dressing removed, incision CDI with dermabond in place. New mepilex dressing applied. Sling removed. Moving arm freely. Difficulty obtaining reliable motor or sensory exam. Able to passively move fingers of left using the right hand. Neurovascularly intact  IMAGING: Stable post op imaging.   LABS:  Results for orders placed or performed during the hospital encounter of 07/17/21 (from the past 24 hour(s))  Basic metabolic panel     Status: Abnormal   Collection Time: 07/22/21  2:25 AM  Result Value Ref Range   Sodium 138 135 - 145 mmol/L   Potassium 4.0 3.5 - 5.1 mmol/L   Chloride 108 98 - 111 mmol/L   CO2 21 (L) 22 - 32 mmol/L   Glucose, Bld 94 70 - 99 mg/dL   BUN 10 8 - 23 mg/dL   Creatinine, Ser 0.59 0.44 - 1.00 mg/dL   Calcium 8.6 (L) 8.9 - 10.3 mg/dL   GFR, Estimated >60 >60 mL/min   Anion gap 9 5 - 15  CBC     Status: Abnormal   Collection Time: 07/22/21  2:25 AM  Result Value Ref Range   WBC 15.8 (H) 4.0 - 10.5 K/uL   RBC 3.12 (L) 3.87 - 5.11 MIL/uL   Hemoglobin 9.4 (L) 12.0 - 15.0 g/dL   HCT 29.1 (L) 36.0 - 46.0 %   MCV 93.3 80.0 - 100.0 fL   MCH 30.1 26.0 - 34.0 pg   MCHC 32.3 30.0 - 36.0 g/dL   RDW 13.3 11.5 - 15.5 %   Platelets 379 150 - 400 K/uL   nRBC 0.0 0.0 - 0.2 %    ASSESSMENT: Mckenzie Guerrero is a 71 y.o. female, 2 Days Post-Op s/p ORIF LEFT MIDSHAFT HUMERUS FRACTURE  CV/Blood loss: Acute blood loss anemia, Hgb 9.4 this morning.   PLAN: Weightbearing:  WBAT for walker ambulation. Otherwise no lifting greater than  2 lb Incisional and dressing care: Changed dressing today 07/22/21. Continue to change PRN. Ok to leave open to air Showering: Ok to begin showering 07/23/20 with assistance Orthopedic device(s):  Sling LUE as needed for comfort   Pain management:  1. Tylenol 325-650 mg q 6 hours PRN 2. Norco 5-325 mg q 4 hours PRN moderate pain 3. Norco 7.5-325 mg q 4 hours PRN severe pain 4. Neurontin 100 mg TID 5. Dilaudid 0.5-1 mg q 4 hours PRN VTE prophylaxis:  Eliquis , SCDs ID:  Ancef 2gm post op completed Foley/Lines: No foley, KVO IVFs Impediments to Fracture Healing: Vit D level 55, no supplementation needed Dispo: Therapies as tolerated. PT/OT recommending SNF. TOC following for placement. Okay for discharge from ortho standpoint once cleared by medicine team and therapies.   Follow - up plan: 2 weeks after d/c for wound check and repeat x-rays  Contact information:  Katha Hamming MD, Rushie Nyhan PA-C. After hours and holidays please check [email or web address] for group call information for [facility]   Gwinda Passe, PA-C [phone] (office) [email or web address]

## 2021-07-22 NOTE — Discharge Instructions (Addendum)
Dear Mckenzie Guerrero, You were hospitalized after you fell and fractured your left arm. You received surgery on 1/9 and are now being discharged to a skilled nursing facility for rehab, so we can build up the strength in your arm, and hopefully prevent any future falls. Please follow up with the orthopedic surgeons in ~2 weeks for a post-op visit.   No new changes were made to your medications while you were hospitalized. I am glad you are feeling better!    Orthopaedic Trauma Service Discharge Instructions   General Discharge Instructions  WEIGHT BEARING STATUS: Weightbearing left arm for walker ambulation. Otherwise, no lifting greater than 2 pounds with left arm  RANGE OF MOTION/ACTIVITY: Ok for unrestricted motion of the shoulder and elbow. Use sling as needed for comfort  Wound Care: Incisions can be left open to air if there is no drainage. If incision continues to have drainage, follow wound care instructions below. Okay to shower if no drainage from incisions.  DVT/PE prophylaxis:  Eliquis  Diet: as you were eating previously.  Can use over the counter stool softeners and bowel preparations, such as Miralax, to help with bowel movements.  Narcotics can be constipating.  Be sure to drink plenty of fluids  PAIN MEDICATION USE AND EXPECTATIONS  You have likely been given narcotic medications to help control your pain.  After a traumatic event that results in an fracture (broken bone) with or without surgery, it is ok to use narcotic pain medications to help control one's pain.  We understand that everyone responds to pain differently and each individual patient will be evaluated on a regular basis for the continued need for narcotic medications. Ideally, narcotic medication use should last no more than 6-8 weeks (coinciding with fracture healing).   As a patient it is your responsibility as well to monitor narcotic medication use and report the amount and frequency you use these medications  when you come to your office visit.   We would also advise that if you are using narcotic medications, you should take a dose prior to therapy to maximize you participation.  IF YOU ARE ON NARCOTIC MEDICATIONS IT IS NOT PERMISSIBLE TO OPERATE A MOTOR VEHICLE (MOTORCYCLE/CAR/TRUCK/MOPED) OR HEAVY MACHINERY DO NOT MIX NARCOTICS WITH OTHER CNS (CENTRAL NERVOUS SYSTEM) DEPRESSANTS SUCH AS ALCOHOL   STOP SMOKING OR USING NICOTINE PRODUCTS!!!!  As discussed nicotine severely impairs your body's ability to heal surgical and traumatic wounds but also impairs bone healing.  Wounds and bone heal by forming microscopic blood vessels (angiogenesis) and nicotine is a vasoconstrictor (essentially, shrinks blood vessels).  Therefore, if vasoconstriction occurs to these microscopic blood vessels they essentially disappear and are unable to deliver necessary nutrients to the healing tissue.  This is one modifiable factor that you can do to dramatically increase your chances of healing your injury.    (This means no smoking, no nicotine gum, patches, etc)  DO NOT USE NONSTEROIDAL ANTI-INFLAMMATORY DRUGS (NSAID'S)  Using products such as Advil (ibuprofen), Aleve (naproxen), Motrin (ibuprofen) for additional pain control during fracture healing can delay and/or prevent the healing response.  If you would like to take over the counter (OTC) medication, Tylenol (acetaminophen) is ok.  However, some narcotic medications that are given for pain control contain acetaminophen as well. Therefore, you should not exceed more than 4000 mg of tylenol in a day if you do not have liver disease.  Also note that there are may OTC medicines, such as cold medicines and allergy medicines that  my contain tylenol as well.  If you have any questions about medications and/or interactions please ask your doctor/PA or your pharmacist.      ICE AND ELEVATE INJURED/OPERATIVE EXTREMITY  Using ice and elevating the injured extremity above your  heart can help with swelling and pain control.  Icing in a pulsatile fashion, such as 20 minutes on and 20 minutes off, can be followed.    Do not place ice directly on skin. Make sure there is a barrier between to skin and the ice pack.    Using frozen items such as frozen peas works well as the conform nicely to the are that needs to be iced.  USE AN ACE WRAP OR TED HOSE FOR SWELLING CONTROL  In addition to icing and elevation, Ace wraps or TED hose are used to help limit and resolve swelling.  It is recommended to use Ace wraps or TED hose until you are informed to stop.    When using Ace Wraps start the wrapping distally (farthest away from the body) and wrap proximally (closer to the body)   Example: If you had surgery on your leg or thing and you do not have a splint on, start the ace wrap at the toes and work your way up to the thigh        If you had surgery on your upper extremity and do not have a splint on, start the ace wrap at your fingers and work your way up to the upper arm   [place]: [phone]   VISIT OUR WEBSITE FOR ADDITIONAL INFORMATION: [email or web address]    Discharge Wound Care Instructions  Do NOT apply any ointments, solutions or lotions to pin sites or surgical wounds.  These prevent needed drainage and even though solutions like hydrogen peroxide kill bacteria, they also damage cells lining the pin sites that help fight infection.  Applying lotions or ointments can keep the wounds moist and can cause them to breakdown and open up as well. This can increase the risk for infection. When in doubt call the office.  If any drainage is noted, use foam dressing  Once the incision is completely dry and without drainage, it may be left open to air out.  Showering may begin 36-48 hours later.  Cleaning gently with soap and water.  Information on my medicine - ELIQUIS (apixaban)  This medication education was reviewed with me or my  healthcare representative as part of my discharge preparation.  The pharmacist that spoke with me during my hospital stay was:    Why was Eliquis prescribed for you? Eliquis was prescribed for you to reduce the risk of a blood clot forming that can cause a stroke if you have a medical condition called atrial fibrillation (a type of irregular heartbeat).  What do You need to know about Eliquis ? Take your Eliquis TWICE DAILY - one tablet in the morning and one tablet in the evening with or without food. If you have difficulty swallowing the tablet whole please discuss with your pharmacist how to take the medication safely.  Take Eliquis exactly as prescribed by your doctor and DO NOT stop taking Eliquis without talking to the doctor who prescribed the medication.  Stopping may increase your risk of developing a stroke.  Refill your prescription before you run out.  After discharge, you should have regular check-up appointments with your healthcare provider that is prescribing your Eliquis.  In the future your dose  may need to be changed if your kidney function or weight changes by a significant amount or as you get older.  What do you do if you miss a dose? If you miss a dose, take it as soon as you remember on the same day and resume taking twice daily.  Do not take more than one dose of ELIQUIS at the same time to make up a missed dose.  Important Safety Information A possible side effect of Eliquis is bleeding. You should call your healthcare provider right away if you experience any of the following: Bleeding from an injury or your nose that does not stop. Unusual colored urine (red or dark brown) or unusual colored stools (red or black). Unusual bruising for unknown reasons. A serious fall or if you hit your head (even if there is no bleeding).  Some medicines may interact with Eliquis and might increase your risk of bleeding or clotting while on Eliquis. To help avoid this,  consult your healthcare provider or pharmacist prior to using any new prescription or non-prescription medications, including herbals, vitamins, non-steroidal anti-inflammatory drugs (NSAIDs) and supplements.  This website has more information on Eliquis (apixaban): http://www.eliquis.com/eliquis/home

## 2021-07-23 DIAGNOSIS — S42302A Unspecified fracture of shaft of humerus, left arm, initial encounter for closed fracture: Secondary | ICD-10-CM

## 2021-07-23 DIAGNOSIS — D62 Acute posthemorrhagic anemia: Secondary | ICD-10-CM | POA: Diagnosis not present

## 2021-07-23 DIAGNOSIS — U071 COVID-19: Secondary | ICD-10-CM | POA: Diagnosis not present

## 2021-07-23 LAB — IRON AND TIBC
Iron: 34 ug/dL (ref 28–170)
Saturation Ratios: 13 % (ref 10.4–31.8)
TIBC: 252 ug/dL (ref 250–450)
UIBC: 218 ug/dL

## 2021-07-23 LAB — CBC
HCT: 29 % — ABNORMAL LOW (ref 36.0–46.0)
Hemoglobin: 9.3 g/dL — ABNORMAL LOW (ref 12.0–15.0)
MCH: 29.8 pg (ref 26.0–34.0)
MCHC: 32.1 g/dL (ref 30.0–36.0)
MCV: 92.9 fL (ref 80.0–100.0)
Platelets: 439 10*3/uL — ABNORMAL HIGH (ref 150–400)
RBC: 3.12 MIL/uL — ABNORMAL LOW (ref 3.87–5.11)
RDW: 13.4 % (ref 11.5–15.5)
WBC: 13.3 10*3/uL — ABNORMAL HIGH (ref 4.0–10.5)
nRBC: 0 % (ref 0.0–0.2)

## 2021-07-23 LAB — BASIC METABOLIC PANEL
Anion gap: 11 (ref 5–15)
BUN: 12 mg/dL (ref 8–23)
CO2: 21 mmol/L — ABNORMAL LOW (ref 22–32)
Calcium: 8.4 mg/dL — ABNORMAL LOW (ref 8.9–10.3)
Chloride: 106 mmol/L (ref 98–111)
Creatinine, Ser: 0.46 mg/dL (ref 0.44–1.00)
GFR, Estimated: >60 mL/min (ref >60)
Glucose, Bld: 90 mg/dL (ref 70–99)
Potassium: 3.4 mmol/L — ABNORMAL LOW (ref 3.5–5.1)
Sodium: 138 mmol/L (ref 135–145)

## 2021-07-23 LAB — FERRITIN: Ferritin: 123 ng/mL (ref 11–307)

## 2021-07-23 MED ORDER — POTASSIUM CHLORIDE 20 MEQ PO PACK
40.0000 meq | PACK | Freq: Once | ORAL | Status: AC
  Administered 2021-07-23: 40 meq via ORAL
  Filled 2021-07-23: qty 2

## 2021-07-23 NOTE — TOC Progression Note (Addendum)
Transition of Care [facility]) - Progression Note    Patient Details  Name: Mckenzie Guerrero MRN: [id number] Date of Birth: December 22, 1950  Transition of Care [facility]) CM/SW Contact  Joanne Chars, LCSW Phone Number: 07/23/2021, 1:38 PM  Clinical Narrative:  CSW spoke with son [name] in hallway, presented bed offers and gave choice document.  He asked that CSW get response from [facility] in [place].  Also interested in [facility], who is full but CSW can check back when pt closer to [place].  CSW reached out to [place] at [facility] and asked her to review referral.  TOC will continue to follow.     1430: pt discussed at [place].  CSW informed that asymptomatic covid patients may be able to go to [place].  CSW spoke with Helene Kelp at [facility], now [place].  She will review pt but will have to check as this may have changed.  Expected Discharge Plan: [place] Barriers to Discharge: Continued Medical Work up, Ship broker, SNF Pending bed offer  Expected Discharge Plan and Services Expected Discharge Plan: [place] In-house Referral: Clinical Social Work                                             Social Determinants of Health (SDOH) Interventions    Readmission Risk Interventions No flowsheet data found.

## 2021-07-23 NOTE — Progress Notes (Signed)
° °  HD#6 SUBJECTIVE:  Patient Summary: Mckenzie Guerrero is a 71 y.o. with a pertinent PMH of HTN, Afib on Eliquis, and recent CVA admitted from 11/28-12/3, who presented with a fall and admitted for a COVID-19 and left midshaft humerus fracture s/p ORIF on 1/9.   Overnight Events: No acute events overnight  Interim History: This is hospital day 6 for Mckenzie Guerrero who was seen and evaluated at the bedside this morning.  She seems to be doing well and was sitting up in her bedside chair. Denied any acute concerns and was able to move her left arm around.   OBJECTIVE:  Vital Signs: Vitals:   07/22/21 0827 07/22/21 2132 07/23/21 0112 07/23/21 0535  BP: (!) 146/91 116/75 132/84 (!) 137/95  Pulse: (!) 101 77 76 86  Resp: 18 18 16 17   Temp: 98.6 F (37 C) 98.4 F (36.9 C) 97.8 F (36.6 C) 98.3 F (36.8 C)  TempSrc: Oral Oral Axillary   SpO2: 98% 99% 98% 98%  Weight:      Height:       Supplemental O2: Room Air SpO2: 98 %  Filed Weights   07/17/21 2155  Weight: 58.9 kg     Intake/Output Summary (Last 24 hours) at 07/23/2021 0733 Last data filed at 07/23/2021 0303 Gross per 24 hour  Intake 5459.23 ml  Output 300 ml  Net 5159.23 ml   Net IO Since Admission: 3,924.68 mL [07/23/21 0733]  Physical Exam: General: Resting comfortably in bedside chair. No acute distress. CV: RRR. No murmurs Pulmonary: Lungs CTAB. Normal effort.  Extremities: L arm with improved ROM and able to wiggle fingers appropriately.  Skin: Warm and dry.  Neuro: Awake and alert, although unable to converse 2/2 expressive aphasia     ASSESSMENT/PLAN:  Assessment: Principal Problem:   Humerus fracture Active Problems:   HTN (hypertension)   AF (paroxysmal atrial fibrillation) (HCC)   COVID-19 virus infection   Plan: #Left midshaft humerus fracture s/p ORIF on 1/9 Patient is POD 3 following ORIF with ortho. She continues to be able to move her left arm. Only required 1 prn dose of norco in the  last 24 hrs. Patient is recommended to go to SNF by PT/OT, however, she will not be able to do so until 1/17 when she is out of airborne/contact precautions.  - Tylenol 325-650 mg q6h prn for mild pain - Norco 5-325 mg q4h prn for moderate pain - Norco 7.5-325 mg q4h for severe pain - Ortho follow up in 2 weeks  #Acute blood loss anemia Hb stable this morning at 9.3. Iron studies all within normal limits, however, iron deficit calculated at ~900 mg. Will consider iron supplementation.   #COVID-19 Tested positive on 1/6, airborne and contact precautions to continue through 1/16. Will be out of precautions on 1/17 and able to go to SNF at this point.   Best Practice: Diet: Cardiac diet IVF: Fluids: none VTE: Eliquis Code: Full AB: none Therapy Recs: SNF Family Contact: son, [name], to be notified. DISPO: Anticipated discharge in 5 days to Skilled nursing facility pending  placement and COVID precautions .  Signature: Buddy Duty, D.O.  Internal Medicine Resident, PGY-1 [facility]  Pager: [phone] 7:33 AM, 07/23/2021   Please contact the on call pager after 5 pm and on weekends at [phone].

## 2021-07-23 NOTE — Progress Notes (Signed)
Physical Therapy Treatment Patient Details Name: Mckenzie Guerrero MRN: [id number] DOB: Jun 25, 1951 Today's Date: 07/23/2021   History of Present Illness 71 y.o. female s/p fall and admission 1/6 with L humerus fx, s/p ORIF 1/9.[facility] MRI + new areas of acute CVA in L MCA territory. + for COVID.  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    PT Comments    Pt continues with expressive aphasia but demonstrated improved mobility compared to previous session. Pt with minimal posterior bias this date and was able to ambulate in room with modAx2 via IV pole and HHA. Pt remains impulsive, restless, and anxious. Pt with urinary incontinence during ambulation, Dependent for hygiene. With tactile cues and demonstration pt able to doff socks but dependent to don socks. Acute PT to cont to follow.    Recommendations for follow up therapy are one component of a multi-disciplinary discharge planning process, led by the attending physician.  Recommendations may be updated based on patient status, additional functional criteria and insurance authorization.  Follow Up Recommendations  Skilled nursing-short term rehab (<3 hours/day)     Assistance Recommended at Discharge Frequent or constant Supervision/Assistance  Patient can return home with the following A lot of help with walking and/or transfers;A little help with bathing/dressing/bathroom;Assistance with cooking/housework;Assist for transportation;Help with stairs or ramp for entrance   Equipment Recommendations   (TBD at next facility)    Recommendations for Other Services       Precautions / Restrictions Precautions Precautions: Fall Precaution Comments: Expressive > receptive aphasia, R inattention Required Braces or Orthoses: Sling (sling for comfort to L UE) Restrictions Weight Bearing Restrictions: Yes LUE Weight Bearing: Weight bearing  as tolerated Other Position/Activity Restrictions: WBAT for RW only, sling for comfort, cannot lift more than 2lbs. no ROM restrictions     Mobility  Bed Mobility Overal bed mobility: Needs Assistance Bed Mobility: Sit to Supine       Sit to supine: Min assist   General bed mobility comments: pt requiring tactile cues to initiate transfer to supine in bed, once initiated pt able to return to supine in the bed    Transfers Overall transfer level: Needs assistance Equipment used: 2 person hand held assist Transfers: Sit to/from Stand Sit to Stand: +2 physical assistance;Min assist           General transfer comment: pt impulsive and powered up prior to therapist being ready, minAx2 to steady pt and manage IV pole, pt with no posterior bias today as was last session    Ambulation/Gait Ambulation/Gait assistance: Mod assist;+2 safety/equipment Gait Distance (Feet): 40 Feet Assistive device: IV Pole;1 person hand held assist (2nd person for lines) Gait Pattern/deviations: Step-through pattern;Decreased stride length;Narrow base of support;Staggering left;Staggering right Gait velocity: dec Gait velocity interpretation: <1.31 ft/sec, indicative of household ambulator   General Gait Details: pt initiated taking steps, however unsteady requiring PT to provide tactile cues via gait belt at pelvis to stabilize pt   Stairs             Wheelchair Mobility    Modified Rankin (Stroke Patients Only)       Balance Overall balance assessment: History of Falls;Needs assistance Sitting-balance support: Feet supported;Single extremity supported Sitting balance-Leahy Scale: Fair Sitting balance - Comments: needs occasional UE support sitting EOB   Standing balance support: During functional activity;Single extremity supported Standing balance-Leahy Scale: Poor  Cognition Arousal/Alertness: Awake/alert Behavior During Therapy:  Restless;Anxious Overall Cognitive Status: Difficult to assess                                 General Comments: pt continues to have difficulty expressing needs due to expressive aphasia, noted decreased safety awareness        Exercises      General Comments General comments (skin integrity, edema, etc.): L surgical incision covered by dressing      Pertinent Vitals/Pain Pain Assessment: Faces Faces Pain Scale: Hurts little more Pain Location: LUE with movement Pain Descriptors / Indicators: Guarding;Grimacing;Moaning Pain Intervention(s): Monitored during session    Home Living                          Prior Function            PT Goals (current goals can now be found in the care plan section) Acute Rehab PT Goals Patient Stated Goal: unable to state PT Goal Formulation: With family Time For Goal Achievement: 08/01/21 Potential to Achieve Goals: Fair Progress towards PT goals: Progressing toward goals    Frequency    Min 3X/week      PT Plan Current plan remains appropriate    Co-evaluation              AM-PAC PT "6 Clicks" Mobility   Outcome Measure  Help needed turning from your back to your side while in a flat bed without using bedrails?: A Little Help needed moving from lying on your back to sitting on the side of a flat bed without using bedrails?: A Little Help needed moving to and from a bed to a chair (including a wheelchair)?: A Lot Help needed standing up from a chair using your arms (e.g., wheelchair or bedside chair)?: A Lot Help needed to walk in hospital room?: A Lot Help needed climbing 3-5 steps with a railing? : Total 6 Click Score: 13    End of Session Equipment Utilized During Treatment: Gait belt Activity Tolerance: Patient limited by fatigue;Patient limited by pain Patient left: with call bell/phone within reach;in bed;with bed alarm set Nurse Communication: Mobility status PT Visit Diagnosis:  Unsteadiness on feet (R26.81);Muscle weakness (generalized) (M62.81);Pain Pain - Right/Left: Left Pain - part of body: Arm     Time: 8182-9937 PT Time Calculation (min) (ACUTE ONLY): 22 min  Charges:  $Gait Training: 8-22 mins                     Kittie Plater, PT, DPT Acute Rehabilitation Services Pager #: [phone] Office #: [phone]    Berline Lopes 07/23/2021, 12:43 PM

## 2021-07-23 NOTE — Progress Notes (Signed)
Occupational Therapy Treatment Patient Details Name: Mckenzie Guerrero MRN: [id number] DOB: 07-07-1951 Today's Date: 07/23/2021   History of present illness 71 y.o. female s/p fall and admission 1/6 with L humerus fx, s/p ORIF 1/9.Marland Kitchen MRI + new areas of acute CVA in L MCA territory. + for COVID.  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   OT comments  Pt able to move to EOB with min A.  She requires min A for self feeding activities, but decreased intake.  PROM/AAROM performed Lt UE.  Pt appears to have Lt wrist drop with no active/spontaneous wrist extension noted 0 would benefit from Lt wrist splint to prevent overstretch of carpals.  Will continue to follow.    Recommendations for follow up therapy are one component of a multi-disciplinary discharge planning process, led by the attending physician.  Recommendations may be updated based on patient status, additional functional criteria and insurance authorization.    Follow Up Recommendations  Skilled nursing-short term rehab (<3 hours/day)    Assistance Recommended at Discharge Frequent or constant Supervision/Assistance  Patient can return home with the following  A lot of help with bathing/dressing/bathroom;Assistance with cooking/housework;Direct supervision/assist for financial management;Direct supervision/assist for medications management;Assist for transportation;Help with stairs or ramp for entrance;A lot of help with walking and/or transfers   Equipment Recommendations  None recommended by OT;Other (comment)    Recommendations for Other Services      Precautions / Restrictions Precautions Precautions: Fall Precaution Comments: Expressive > receptive aphasia, R inattention Required Braces or Orthoses: Sling (for comfort) Restrictions Weight Bearing Restrictions: Yes LUE Weight Bearing: Weight bear through elbow  only Other Position/Activity Restrictions: WBAT for RW only, sling for comfort, cannot lift more than 2lbs. no ROM restrictions       Mobility Bed Mobility Overal bed mobility: Needs Assistance Bed Mobility: Supine to Sit;Sit to Supine     Supine to sit: Min assist Sit to supine: Min assist        Transfers                         Balance Overall balance assessment: History of Falls;Needs assistance Sitting-balance support: Feet supported;Single extremity supported Sitting balance-Leahy Scale: Fair                                     ADL either performed or assessed with clinical judgement   ADL Overall ADL's : Needs assistance/impaired Eating/Feeding: Minimal assistance;Cueing for safety;Cueing for sequencing                                   Functional mobility during ADLs: Moderate assistance;Minimal assistance      Extremity/Trunk Assessment Upper Extremity Assessment Upper Extremity Assessment: LUE deficits/detail LUE Deficits / Details: Shoulder grossly WFL; elbow ext ~10-15 with PROM.  able to achieve elbow flexion WFL with gentle PROM.  Does not appear to have active wrist extension.  PROM of wrist and fingers WFL.  Unable to accurately assess finger function or sensation due to communication deficits. LUE Coordination: decreased gross motor;decreased fine motor   Lower Extremity Assessment Lower Extremity Assessment: Defer to PT evaluation        Vision       Perception  Praxis      Cognition Arousal/Alertness: Awake/alert Behavior During Therapy: WFL for tasks assessed/performed;Restless Overall Cognitive Status: Difficult to assess                                 General Comments: Pt will follow an occasional command with max gestural cues          Exercises Exercises: Other exercises Other Exercises Other Exercises: PROM elbow with gentle passive stretch of elbow flexion and  extension Other Exercises: PROM shoulder flexion, ER/IR, abduction, and horizontal abd/add x 10 Other Exercises: wrist and finger PROM x 10   Shoulder Instructions       General Comments      Pertinent Vitals/ Pain       Pain Assessment: Faces Faces Pain Scale: Hurts even more Pain Location: Lt UE with ROM Pain Descriptors / Indicators: Grimacing;Guarding Pain Intervention(s): Monitored during session;Repositioned  Home Living                                          Prior Functioning/Environment              Frequency  Min 2X/week        Progress Toward Goals  OT Goals(current goals can now be found in the care plan section)  Progress towards OT goals: Progressing toward goals     Plan Discharge plan remains appropriate    Co-evaluation                 AM-PAC OT "6 Clicks" Daily Activity     Outcome Measure   Help from another person eating meals?: A Little Help from another person taking care of personal grooming?: A Lot Help from another person toileting, which includes using toliet, bedpan, or urinal?: Total Help from another person bathing (including washing, rinsing, drying)?: Total Help from another person to put on and taking off regular upper body clothing?: A Lot Help from another person to put on and taking off regular lower body clothing?: A Lot 6 Click Score: 11    End of Session    OT Visit Diagnosis: Unsteadiness on feet (R26.81);Muscle weakness (generalized) (M62.81);History of falling (Z91.81);Apraxia (R48.2);Other symptoms and signs involving cognitive function;Other symptoms and signs involving the nervous system (R29.898);Cognitive communication deficit (R41.841);Pain Pain - Right/Left: Left Pain - part of body: Arm;Shoulder   Activity Tolerance Patient tolerated treatment well   Patient Left in bed;with call bell/phone within reach;with bed alarm set   Nurse Communication Mobility status        Time:  9767-3419 OT Time Calculation (min): 23 min  Charges: OT General Charges $OT Visit: 1 Visit OT Treatments $Self Care/Home Management : 8-22 mins $Therapeutic Exercise: 8-22 mins  [name]., OTR/L Acute Rehabilitation Services Pager [phone] Office [phone]   [name] 07/23/2021, 4:54 PM

## 2021-07-24 DIAGNOSIS — S42202A Unspecified fracture of upper end of left humerus, initial encounter for closed fracture: Secondary | ICD-10-CM | POA: Diagnosis not present

## 2021-07-24 LAB — BASIC METABOLIC PANEL
Anion gap: 11 (ref 5–15)
BUN: 9 mg/dL (ref 8–23)
CO2: 19 mmol/L — ABNORMAL LOW (ref 22–32)
Calcium: 8.9 mg/dL (ref 8.9–10.3)
Chloride: 108 mmol/L (ref 98–111)
Creatinine, Ser: 0.54 mg/dL (ref 0.44–1.00)
GFR, Estimated: >60 mL/min (ref >60)
Glucose, Bld: 86 mg/dL (ref 70–99)
Potassium: 4 mmol/L (ref 3.5–5.1)
Sodium: 138 mmol/L (ref 135–145)

## 2021-07-24 LAB — CBC
HCT: 28 % — ABNORMAL LOW (ref 36.0–46.0)
Hemoglobin: 9.1 g/dL — ABNORMAL LOW (ref 12.0–15.0)
MCH: 30 pg (ref 26.0–34.0)
MCHC: 32.5 g/dL (ref 30.0–36.0)
MCV: 92.4 fL (ref 80.0–100.0)
Platelets: 494 10*3/uL — ABNORMAL HIGH (ref 150–400)
RBC: 3.03 MIL/uL — ABNORMAL LOW (ref 3.87–5.11)
RDW: 13.6 % (ref 11.5–15.5)
WBC: 14.5 10*3/uL — ABNORMAL HIGH (ref 4.0–10.5)
nRBC: 0.3 % — ABNORMAL HIGH (ref 0.0–0.2)

## 2021-07-24 LAB — URINALYSIS, COMPLETE (UACMP) WITH MICROSCOPIC
Bilirubin Urine: NEGATIVE
Glucose, UA: NEGATIVE mg/dL
Ketones, ur: NEGATIVE mg/dL
Nitrite: NEGATIVE
Protein, ur: NEGATIVE mg/dL
Specific Gravity, Urine: 1.01 (ref 1.005–1.030)
WBC, UA: >50 WBC/hpf — ABNORMAL HIGH (ref 0–5)
pH: 7 (ref 5.0–8.0)

## 2021-07-24 NOTE — Progress Notes (Signed)
Nutrition Follow-up  DOCUMENTATION CODES:   Not applicable  INTERVENTION:  -continue Ensure Enlive po BID, each supplement provides 350 kcal and 20 grams of protein  NUTRITION DIAGNOSIS:   Increased nutrient needs related to catabolic illness (COVID) as evidenced by estimated needs.  ongoing  GOAL:   Patient will meet greater than or equal to 90% of their needs  progressing  MONITOR:   PO intake, Supplement acceptance, Skin, Weight trends, Labs, I & O's  REASON FOR ASSESSMENT:   Consult Assessment of nutrition requirement/status  ASSESSMENT:   71 yo female with HTN, Afib on Eliquis, and recent CVA (admitted 11/28-12/3; at [facility] from 12/3-12/20) who presented to the hospital after a fall. Pt with left midshaft humerus fracture. Workup with MRI Brain without contrast which demonstrated new areas of acute stroke in the left MCA territory along with her prior stroke from Nov 2022. She is also positive for COVID 19.  1/09 - s/p ORIF L humeral shaft fracture   Pt unavailable at time of RD visit. Pt is POD 4 following ORIF with ortho. P recommended to go to SNF by PT/OT, however, this is pending pt's airborne/contact precautions ending. Should be able to go to SNF on 1/17 per MD.   PO Intake: 25% x 1 recorded meal   UOP: 3x unmeasured occurrences x24 hours I/O: +6953ml since admit  Medications: Scheduled Meds:  apixaban  5 mg Oral BID   atorvastatin  40 mg Oral Daily   docusate sodium  100 mg Oral BID   feeding supplement  237 mL Oral BID BM   metoprolol succinate  12.5 mg Oral Q breakfast   nystatin cream   Topical BID   polyethylene glycol  17 g Oral Daily   senna-docusate  1 tablet Oral BID  Continuous Infusions:  sodium chloride 100 mL/hr at 07/24/21 1615    Labs: Recent Labs  Lab 07/22/21 0225 07/23/21 0316 07/24/21 0255  NA 138 138 138  K 4.0 3.4* 4.0  CL 108 106 108  CO2 21* 21* 19*  BUN 10 12 9   CREATININE 0.59 0.46 0.54  CALCIUM 8.6* 8.4* 8.9   GLUCOSE 94 90 86   Diet Order:   Diet Order             DIET DYS 2 Room service appropriate? Yes; Fluid consistency: Thin  Diet effective now                   EDUCATION NEEDS:   Not appropriate for education at this time  Skin:  Skin Assessment: Reviewed RN Assessment  Last BM:  Unknown  Height:   Ht Readings from Last 1 Encounters:  07/17/21 5\' 2"  (1.575 m)    Weight:   Wt Readings from Last 1 Encounters:  07/17/21 58.9 kg    BMI:  Body mass index is 23.75 kg/m.  Estimated Nutritional Needs:   Kcal:  1700-1900  Protein:  80-90 grams  Fluid:  >/= 1.7 L/day     Theone Stanley., MS, RD, LDN (she/her/hers) RD pager number and weekend/on-call pager number located in [facility].

## 2021-07-24 NOTE — Progress Notes (Signed)
Orthopedic Tech Progress Note Patient Details:  Mckenzie Guerrero 23-Aug-1950 [id number]  Ortho Devices Type of Ortho Device: Velcro wrist splint Ortho Device/Splint Location: LUE Ortho Device/Splint Interventions: Ordered   Post Interventions Patient Tolerated: Well  Child Campoy A Ross Hefferan 07/24/2021, 5:01 PM

## 2021-07-24 NOTE — Progress Notes (Signed)
° °  HD#7 SUBJECTIVE:  Patient Summary: Mckenzie Guerrero is a 71 y.o. with a pertinent PMH of HTN, Afib on Eliquis, and recent CVA admitted from 11/28-12/3, who presented with a fall and admitted for a COVID-19 and left midshaft humerus fracture s/p ORIF on 1/9.   Overnight Events: No acute events overnight.  Interim History: This is hospital day 7 for Mckenzie Guerrero who was seen and evaluated at the bedside this morning. She appeared to be resting comfortably and her friend was at the bedside visiting. No acute concerns/complaints today.   OBJECTIVE:  Vital Signs: Vitals:   07/23/21 0112 07/23/21 0535 07/23/21 0757 07/23/21 2022  BP: 132/84 (!) 137/95 (!) 141/90 (!) 125/91  Pulse: 76 86 92 81  Resp: 16 17 16 16   Temp: 97.8 F (36.6 C) 98.3 F (36.8 C) 98 F (36.7 C) 98.8 F (37.1 C)  TempSrc: Axillary  Axillary Oral  SpO2: 98% 98% 98% 100%  Weight:      Height:       Supplemental O2: Room Air SpO2: 100 %  Filed Weights   07/17/21 2155  Weight: 58.9 kg     Intake/Output Summary (Last 24 hours) at 07/24/2021 0734 Last data filed at 07/23/2021 2340 Gross per 24 hour  Intake 1940.06 ml  Output --  Net 1940.06 ml   Net IO Since Admission: 5,864.74 mL [07/24/21 0734]  Physical Exam: General: Resting comfortably in bed. No acute distress. CV: RRR. No murmurs. No LE edema Pulmonary: Lungs CTAB. Normal effort on room air.  Extremities: L arm s/p ORIF, improved ROM. Postop dressing clean/dry/intact Skin: Warm and dry.  Neuro: Awake and alert, expressive aphasia.    ASSESSMENT/PLAN:  Assessment: Principal Problem:   Humerus fracture Active Problems:   HTN (hypertension)   AF (paroxysmal atrial fibrillation) (HCC)   COVID-19 virus infection   Plan: #Left midshaft humerus fracture s/p ORIF on 1/9 POD 4 following ORIF with ortho. Patient recommended to go to SNF by PT/OT, however, this is pending patient's airborne/contact precautions ending. Should be able to go to  SNF on 1/17.  - Tylenol 325-650 mg q6h prn for mild pain - Norco 5-325 mg q4h prn for moderate pain - Norco 7.5-325 mg q4h for severe pain - Ortho follow up in 2 weeks  #Acute blood loss anemia, stable Hb stable at 9.1. No signs of active bleeding. Do not need to check CBC daily unless signs of bleeding are noted.   #COVID-19 Tested positive on 1/6, airborne and contact precautions to continue through 1/16. Will be out of precautions on 1/17 and able to go to SNF at this point.     Best Practice: Diet: Cardiac diet IVF: Fluids: none VTE: SCDs Start: 07/20/21 1637 SCDs Start: 07/17/21 1428 Code: Full AB: none Therapy Recs: SNF Family Contact: John, to be notified.  DISPO: Anticipated discharge in 4-5 days to Skilled nursing facility pending  placement .  Signature: Buddy Duty, D.O.  Internal Medicine Resident, PGY-1 [facility]  Pager: [phone] 7:34 AM, 07/24/2021   Please contact the on call pager after 5 pm and on weekends at [phone].

## 2021-07-24 NOTE — TOC Progression Note (Signed)
Transition of Care [facility]) - Progression Note    Patient Details  Name: FELIX PRATT MRN: [id number] Date of Birth: 1951/03/14  [facility]) CM/SW Contact  Joanne Chars, LCSW Phone Number: 07/24/2021, 12:33 PM  Clinical Narrative:  CSW received call from Sidney at [facility].  They can accept pt prior to her being out of covid isolation.  They cannot take pt over the weekend, so if pt cannot come today, will need to be Monday.  CSW spoke with supervisor Barbette Or as family has already indicated they want pt in [place], which is close to their home.  Per Denyse Amass, if family declines available SNF insurance could possibly not pay for the final days in acute care.    1100: CSW spoke with pt son [name], discussed the bed offer at [facility].  His preference would be to wait for pt to be able to admit to [facility] but not if insurance will not pay for final days at [facility].  He asked CSW to speak with [employer] and let him now.  1130:  CSW LM with Tammie at [facility] regarding the situation.  1230: CSW spoke with Tammie who cannot say if the final days in acute care would be approved if SNF available.  She is traveling, will not be able to initiate SNF auth until she arrives but will do so and assign as soon as she can.  Unsure if this [facility] would be approved today until they get into the case.     Expected Discharge Plan: [place] Barriers to Discharge: Continued Medical Work up, Ship broker, SNF Pending bed offer  Expected Discharge Plan and Services Expected Discharge Plan: [place] In-house Referral: Clinical Social Work                                             Social Determinants of Health (SDOH) Interventions    Readmission Risk Interventions No flowsheet data found.

## 2021-07-24 NOTE — Progress Notes (Signed)
Speech Language Pathology Treatment: Dysphagia;Cognitive-Linquistic  Patient Details Name: Mckenzie Guerrero MRN: [id number] DOB: [birth year] Today's Date: 07/24/2021 Time: 1207-1232 SLP Time Calculation (min) (ACUTE ONLY): 25 min  Assessment / Plan / Recommendation Clinical Impression  SWALLOWING Pt tolerating chopped/ground diet well per RN report.  Today with trials of regular and soft solid there was prolonged oral phase and oral residue R>L.  Pt exhibited good clearance of regular solid on initial trial with residuals increasing across trials, suggesting fatigue is still impacting swallow function as noted by CIR. Pt also appeared to be experiencing some discomfort/pain during this session, which may have impacted performance as well.  Pt fidgeting and shifting in bed and grabbing groin region.  RN notified and assessed pt with plans to request UA.  COMMUNICATION Severe expressive and expressive language deficits persist.  Today pt identified objects from field of 2 with 100% accuracy (up from 0% earlier in the week).  Pt was slightly above 50% accuracy with personal yes/no questions.  Pt did not attempt to participate in rote speech counting task, or choral singing.  Pt was unable to complete repetition task.  With simple, 1-step commands pt performed with 40% accuracy.     It is unlikely given severity of deficits and outcomes from CIR that pt will make much progress during this stay; however, pt may still benefit from consistent therapy at next level of care to improve function in SNF setting.  SLP will continue to follow for diet tolerance and possible upgrade as well as communication.  Pt currently planned for d/c after COVID precautions cleared.    HPI HPI: Mckenzie Guerrero is a 71 yo female with HTN, Afib on Eliquis, and recent CVA (admitted 11/28-12/3; at [facility] from 12/3-12/20) who presented to the hospital after a fall.      The patient has non-intelligible speech at baseline secondary to  her recent CVA, so history was obtained from her son, [name], who was at the bedside. He states that this morning the patient fell while she was in her bathroom. He is unsure if she lost consciousness, but states that she called out to her boyfriend who helped her stand up after and brought her to the ED. The patient has had difficulties with balance and gait over the last few days. Of note, she was recently discharged from [facility] on 12/20 following rehabilitation for a left MCA infarction that left her with right sided deficits. She attempted to catch herself during the fall with her left arm and was found sitting on the ground by her boyfriend. The patient endorses significant pain in her left arm and is unable to move it now; 07/17/21 MRI brain indicated Although there are some conspicuous areas of definitive diffusion  restriction (adjacent to the left frontal horn series 2, image 31),  on close inspection with the 06/09/2021 DWI the parenchyma in that  same location seem to be restricted before. And I discussed with Dr.  Erlinda Hong that I have occasionally seen infarct cases with prolonged  diffusion restriction (sometimes a year or more) which I presume is  due to some type of viscous necrosis of the infarcted tissue.  Superimposed laminar necrosis and petechial hemorrhage also seen in  other areas of the chronic ischemia as reported by Dr. Lennon Alstrom.  SLE/BSE generated      SLP Plan  Continue with current plan of care      Recommendations for follow up therapy are one component of a multi-disciplinary discharge planning process,  led by the attending physician.  Recommendations may be updated based on patient status, additional functional criteria and insurance authorization.    Recommendations  Diet recommendations: Dysphagia 2 (fine chop);Thin liquid Liquids provided via: Straw;Cup Medication Administration: Crushed with puree Supervision: Staff to assist with self feeding Compensations: Slow rate;Small  sips/bites;Minimize environmental distractions Postural Changes and/or Swallow Maneuvers: Seated upright 90 degrees                Oral Care Recommendations: Oral care BID;Staff/trained caregiver to provide oral care Follow Up Recommendations: Skilled nursing-short term rehab (<3 hours/day) Assistance recommended at discharge: Frequent or constant Supervision/Assistance SLP Visit Diagnosis: Aphasia (R47.01);Dysphagia, oropharyngeal phase (R13.12) Plan: Continue with current plan of care           Celedonio Savage , [place], [place] Office: [phone]   07/24/2021, 12:42 PM

## 2021-07-25 MED ORDER — ACETAMINOPHEN 500 MG PO TABS
1000.0000 mg | ORAL_TABLET | Freq: Three times a day (TID) | ORAL | Status: DC
  Administered 2021-07-25 – 2021-07-28 (×10): 1000 mg via ORAL
  Filled 2021-07-25 (×10): qty 2

## 2021-07-25 MED ORDER — HYDROCODONE-ACETAMINOPHEN 5-325 MG PO TABS
1.0000 | ORAL_TABLET | Freq: Four times a day (QID) | ORAL | Status: DC | PRN
  Administered 2021-07-25 – 2021-07-26 (×3): 1 via ORAL
  Administered 2021-07-26: 2 via ORAL
  Filled 2021-07-25: qty 2
  Filled 2021-07-25 (×3): qty 1
  Filled 2021-07-25: qty 2

## 2021-07-25 MED ORDER — HYDROCODONE-ACETAMINOPHEN 7.5-325 MG PO TABS: 1.0000 | ORAL_TABLET | Freq: Four times a day (QID) | ORAL | Status: DC | PRN

## 2021-07-25 MED ORDER — NITROFURANTOIN MONOHYD MACRO 100 MG PO CAPS
100.0000 mg | ORAL_CAPSULE | Freq: Two times a day (BID) | ORAL | Status: DC
  Administered 2021-07-25 – 2021-07-28 (×7): 100 mg via ORAL
  Filled 2021-07-25 (×8): qty 1

## 2021-07-25 NOTE — Progress Notes (Signed)
Occupational Therapy Treatment Patient Details Name: Mckenzie Guerrero MRN: [id number] DOB: Mar 12, 1951 Today's Date: 07/25/2021   History of present illness 71 y.o. female s/p fall and admission 1/6 with L humerus fx, s/p ORIF 1/9.Marland Kitchen MRI + new areas of acute CVA in L MCA territory. + for COVID.  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   OT comments  Patient continues to make progress towards goals in skilled OT session. Patient's session encompassed on initial donning of splint pre-fabricated wrist cock-up splint, AROM and PROM of LUE and assessing initial fit and function. Patient able to tolerate initial donning of splint, with increased time to assess skin integrity and ensure fit was not too tight. Patient unable to communicate due to aphasia, so relied heavily on facial expressions, with no distress noted. Will follow back in 2 hours to assess skin integrity and tolerance, and then assess at 4 hour mark to determine splint schedule. Therapy will continue to follow.    Recommendations for follow up therapy are one component of a multi-disciplinary discharge planning process, led by the attending physician.  Recommendations may be updated based on patient status, additional functional criteria and insurance authorization.    Follow Up Recommendations  Skilled nursing-short term rehab (<3 hours/day)    Assistance Recommended at Discharge Frequent or constant Supervision/Assistance  Patient can return home with the following  A lot of help with bathing/dressing/bathroom;Assistance with cooking/housework;Direct supervision/assist for financial management;Direct supervision/assist for medications management;Assist for transportation;Help with stairs or ramp for entrance;A lot of help with walking and/or transfers   Equipment Recommendations  None recommended by OT    Recommendations for  Other Services      Precautions / Restrictions Precautions Precautions: Fall Precaution Comments: Expressive > receptive aphasia, R inattention Required Braces or Orthoses: Sling (for comfort) Restrictions Weight Bearing Restrictions: Yes LUE Weight Bearing: Weight bear through elbow only Other Position/Activity Restrictions: WBAT for RW only, sling for comfort, cannot lift more than 2lbs. no ROM restrictions       Mobility Bed Mobility                    Transfers                         Balance                                           ADL either performed or assessed with clinical judgement   ADL Overall ADL's : Needs assistance/impaired                                       General ADL Comments: Session focus on initial donning of splint, AROM and PROM of LUE and assessing initial fit and function    Extremity/Trunk Assessment              Vision       Perception     Praxis      Cognition Arousal/Alertness: Awake/alert Behavior During Therapy: WFL for tasks assessed/performed;Restless Overall Cognitive Status: Difficult to assess  General Comments: Pt will follow an occasional command with max gestural cues          Exercises     Shoulder Instructions       General Comments      Pertinent Vitals/ Pain       Pain Assessment: Faces Faces Pain Scale: Hurts a little bit Pain Location: Lt UE with ROM Pain Descriptors / Indicators: Grimacing;Guarding Pain Intervention(s): Limited activity within patient's tolerance;Monitored during session  Home Living                                          Prior Functioning/Environment              Frequency  Min 2X/week        Progress Toward Goals  OT Goals(current goals can now be found in the care plan section)  Progress towards OT goals: Progressing toward goals  Acute  Rehab OT Goals OT Goal Formulation: Patient unable to participate in goal setting Time For Goal Achievement: 08/04/21 Potential to Achieve Goals: [place] Discharge plan remains appropriate    Co-evaluation                 AM-PAC OT "6 Clicks" Daily Activity     Outcome Measure   Help from another person eating meals?: A Little Help from another person taking care of personal grooming?: A Lot Help from another person toileting, which includes using toliet, bedpan, or urinal?: Total Help from another person bathing (including washing, rinsing, drying)?: Total Help from another person to put on and taking off regular upper body clothing?: A Lot Help from another person to put on and taking off regular lower body clothing?: A Lot 6 Click Score: 11    End of Session Equipment Utilized During Treatment: Other (comment) (wrist cock-up splint donned)  OT Visit Diagnosis: Unsteadiness on feet (R26.81);Muscle weakness (generalized) (M62.81);History of falling (Z91.81);Apraxia (R48.2);Other symptoms and signs involving cognitive function;Other symptoms and signs involving the nervous system (R29.898);Cognitive communication deficit (R41.841);Pain Pain - Right/Left: Left Pain - part of body: Arm;Shoulder   Activity Tolerance Patient tolerated treatment well   Patient Left in bed;with call bell/phone within reach;with bed alarm set   Nurse Communication Mobility status;Other (comment) (Starting splint assessment)        Time: 0488-8916 OT Time Calculation (min): 13 min  Charges: OT General Charges $OT Visit: 1 Visit OT Treatments $Orthotics Fit/Training: 8-22 mins  [place]. Jessen Siegman, COTA/L Acute Rehabilitation Services [place] 07/25/2021, 11:45 AM

## 2021-07-25 NOTE — Plan of Care (Signed)
  Problem: Activity: Goal: Risk for activity intolerance will decrease Outcome: Progressing   Problem: Nutrition: Goal: Adequate nutrition will be maintained Outcome: Progressing   Problem: Safety: Goal: Ability to remain free from injury will improve Outcome: Progressing   

## 2021-07-25 NOTE — Progress Notes (Signed)
° °  HD#8 SUBJECTIVE:  Patient Summary: Mckenzie Guerrero is a 71 y.o. with a pertinent PMH of HTN, Afib on Eliquis, and recent CVA admitted from 11/28-12/3, who presented with a fall and admitted for a COVID-19 and left midshaft humerus fracture s/p ORIF on 1/9.   Overnight Events: No acute events overnight  Interim History: This is hospital day 8 for this patient who was seen and evaluated at the bedside this morning. She is resting comfortably in bed and has good ROM in her left arm. Does not appear to be in any pain.   OBJECTIVE:  Vital Signs: Vitals:   07/23/21 2022 07/24/21 1028 07/24/21 2002 07/25/21 0617  BP: (!) 125/91 111/70 135/88 121/71  Pulse: 81 90 86 83  Resp: 16 16 16 12   Temp: 98.8 F (37.1 C) 98.4 F (36.9 C) 98.5 F (36.9 C) 98 F (36.7 C)  TempSrc: Oral Oral    SpO2: 100% 99% 100% 98%  Weight:      Height:       Supplemental O2: Room Air SpO2: 98 % O2 Flow Rate (L/min): 2 L/min  Filed Weights   07/17/21 2155  Weight: 58.9 kg     Intake/Output Summary (Last 24 hours) at 07/25/2021 0622 Last data filed at 07/25/2021 0500 Gross per 24 hour  Intake 2375.81 ml  Output 600 ml  Net 1775.81 ml   Net IO Since Admission: 7,640.55 mL [07/25/21 0622]  Physical Exam: General: Resting comfortably in bed. No acute distress. CV: RRR. No LE edema Pulmonary: Lungs CTAB. Normal effort.  Extremities: L arm with improved ROM although noted to have L wrist drop.  Skin: Warm and dry. No obvious rash or lesions. Neuro: Awake and alert. Expressive aphasia.     ASSESSMENT/PLAN:  Assessment: Principal Problem:   Humerus fracture Active Problems:   HTN (hypertension)   AF (paroxysmal atrial fibrillation) (HCC)   COVID-19 virus infection   Plan: #Left midshaft humerus fracture s/p ORIF on 1/9 POD 5 following ORIF with ortho. 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 Still pending  SNF placement, as patient was found to be COVID positive on 1.6. Will be able to go to SNF on 1/17.  - Tylenol 1000 mg tid - Norco 5-325 mg q6h prn for moderate pain - Norco 7.5-325 mg q6h for severe pain - L wrist splint for support  - Post-op ortho follow up in 2 weeks  - PT/OT   #UTI Patient has expressive aphasia and is not able to express what urinary symptoms she has been having, but per nursing staff, she has been have pruritis. Urinalysis was obtained, and showed large leukocytes, >50 WBC, many bacteria, and large Hb.  - Treat with macrobid 100 mg bid x 5 days  #COVID-19 Tested positive on 1/6, airborne and contact precautions to continue through 1/16. Will be out of precautions on 1/17 and able to go to SNF at this point.    Best Practice: Diet: Cardiac diet IVF: Fluids: none VTE: Eliquis Code: Full AB: Macrobid Therapy Recs: SNF Family Contact: son, [name], to be notified. DISPO: Anticipated discharge  in 3-4 days  to Skilled nursing facility pending  placement .  Signature: Buddy Duty, D.O.  Internal Medicine Resident, PGY-1 [facility]  Pager: [phone] 6:22 AM, 07/25/2021   Please contact the on call pager after 5 pm and on weekends at [phone].

## 2021-07-25 NOTE — Progress Notes (Signed)
Occupational Therapy Treatment Patient Details Name: Mckenzie Guerrero MRN: [id number] DOB: 1951-06-04 Today's Date: 07/25/2021   History of present illness 71 y.o. female s/p fall and admission 1/6 with L humerus fx, s/p ORIF 1/9.Marland Kitchen MRI + new areas of acute CVA in L MCA territory. + for COVID.  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   OT comments  This 71 yo female seen for a splint check and it is still causing reddened places on two parts of her hand, will continue to modify what we are doing to see if we can get it where it won't do this.   Recommendations for follow up therapy are one component of a multi-disciplinary discharge planning process, led by the attending physician.  Recommendations may be updated based on patient status, additional functional criteria and insurance authorization.    Follow Up Recommendations  Skilled nursing-short term rehab (<3 hours/day)    Assistance Recommended at Discharge Frequent or constant Supervision/Assistance  Patient can return home with the following  A lot of help with bathing/dressing/bathroom;Assistance with cooking/housework;Direct supervision/assist for financial management;Direct supervision/assist for medications management;Assist for transportation;Help with stairs or ramp for entrance;A lot of help with walking and/or transfers   Equipment Recommendations  None recommended by OT       Precautions / Restrictions Precautions Precautions: Fall Precaution Comments: Expressive > receptive aphasia, R inattention Required Braces or Orthoses: Sling (for comfort) Restrictions Weight Bearing Restrictions: Yes LUE Weight Bearing: Weight bear through elbow only Other Position/Activity Restrictions: WBAT for RW only, sling for comfort, cannot lift more than 2lbs. no ROM restrictions       Mobility Bed Mobility Overal bed  mobility: Needs Assistance Bed Mobility: Supine to Sit;Sit to Supine Rolling: Min guard   Supine to sit: Min guard     General bed mobility comments: VCs to not use RUE In [place]    Transfers Overall transfer level: Needs assistance Equipment used: 1 person hand held assist Transfers: Sit to/from Stand Sit to Stand: Min assist           General transfer comment: VCs to not use RUE in a WB'ing position     Balance Overall balance assessment: Needs assistance Sitting-balance support: No upper extremity supported;Feet supported Sitting balance-Leahy Scale: Good Sitting balance - Comments: needs occasional UE support sitting EOB   Standing balance support: Single extremity supported Standing balance-Leahy Scale: Poor                             ADL either performed or assessed with clinical judgement   ADL Overall ADL's : Needs assistance/impaired       Toilet Transfer: Minimal assistance;Stand-pivot;BSC/3in1 Toilet Transfer Details (indicate cue type and reason): posterior lean Toileting- Clothing Manipulation and Hygiene: Total assistance Toileting - Clothing Manipulation Details (indicate cue type and reason): min A sit<>stand           Cognition Arousal/Alertness: Awake/alert Behavior During Therapy: WFL for tasks assessed/performed;Restless Overall Cognitive Status: Difficult to assess                                 General Comments: Pt will follow an occasional command with max gestural cues          Exercises  Other Exercises Other Exercises: Pt seen for splint  check (wrist cock up). The material of the splint that lays just below MCPs has a pattern to it and even though the splint is not too tight at this area the imprint of the pattern is on her hand causing a reddened area. Also there is a reddend area at the webspace where the strap goes through. Will need to see if there is some way we can pad these areas so they do not  leave reddened areas in these two places.           Pertinent Vitals/ Pain       Pain Assessment: Faces Faces Pain Scale: Hurts little more Pain Location: Lt UE when she moves it a certain way Pain Descriptors / Indicators: Grimacing;Guarding Pain Intervention(s): Limited activity within patient's tolerance;Monitored during session;Repositioned         Frequency  Min 2X/week        Progress Toward Goals  OT Goals(current goals can now be found in the care plan section)  Progress towards OT goals:  (continuing to asses for splint wearing tolerance)  Acute Rehab OT Goals OT Goal Formulation: Patient unable to participate in goal setting Time For Goal Achievement: 08/04/21 Potential to Achieve Goals: Good  Plan Discharge plan remains appropriate       AM-PAC OT "6 Clicks" Daily Activity     Outcome Measure   Help from another person eating meals?: A Little Help from another person taking care of personal grooming?: A Little Help from another person toileting, which includes using toliet, bedpan, or urinal?: A Lot Help from another person bathing (including washing, rinsing, drying)?: A Lot Help from another person to put on and taking off regular upper body clothing?: A Lot Help from another person to put on and taking off regular lower body clothing?: A Lot 6 Click Score: 14    End of Session Equipment Utilized During Treatment: Other (comment) (wrist cock-up slint doffed)  OT Visit Diagnosis: Unsteadiness on feet (R26.81);Muscle weakness (generalized) (M62.81);History of falling (Z91.81);Apraxia (R48.2);Other symptoms and signs involving cognitive function;Other symptoms and signs involving the nervous system (R29.898);Cognitive communication deficit (R41.841);Pain Pain - Right/Left: Left Pain - part of body: Arm;Shoulder   Activity Tolerance Patient tolerated treatment well   Patient Left in bed;with call bell/phone within reach;with bed alarm set   Nurse  Communication  (RN in room with [place] the whole time)        Time: [phone] OT Time Calculation (min): 14 min  Charges: OT General Charges $OT Visit: 1 Visit OT Treatments $Self Care/Home Management : 8-22 mins $Orthotics/Prosthetics Check: 8-22 mins  [place]en Circle, OTR/L Acute Rehab Services Pager [phone] Office [phone]    Almon Register 07/25/2021, 5:30 PM

## 2021-07-25 NOTE — Plan of Care (Signed)

## 2021-07-25 NOTE — Progress Notes (Signed)
Occupational Therapy Treatment Patient Details Name: Mckenzie Guerrero MRN: [id number] DOB: 27-Mar-1951 Today's Date: 07/25/2021   History of present illness 71 y.o. female s/p fall and admission 1/6 with L humerus fx, s/p ORIF 1/9.Marland Kitchen MRI + new areas of acute CVA in L MCA territory. + for COVID.  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   OT comments  Patient continues to make progress towards goals in skilled OT session. Patient's session encompassed splint check, toilet transfer, and transfer to recliner. Splint doffed in session with noted redness below MCPs, and in web space between thumb and index finger. Of note, redness had improved at end of session but was not completely gone. Also noted swelling pooling between splint and elbow, despite elevation at joint (no red band or mark at end of splint to suggest that splint was too tight against skin). Patient impulsively getting out of bed to use BSC, with improved mobility and ability to complete hygiene. Patient in chair at end of session with LUE elevated. RN informed about splint, will reassess later this afternoon to determine splint schedule.    Recommendations for follow up therapy are one component of a multi-disciplinary discharge planning process, led by the attending physician.  Recommendations may be updated based on patient status, additional functional criteria and insurance authorization.    Follow Up Recommendations  Skilled nursing-short term rehab (<3 hours/day)    Assistance Recommended at Discharge Frequent or constant Supervision/Assistance  Patient can return home with the following  A lot of help with bathing/dressing/bathroom;Assistance with cooking/housework;Direct supervision/assist for financial management;Direct supervision/assist for medications management;Assist for transportation;Help with stairs or ramp for  entrance;A lot of help with walking and/or transfers   Equipment Recommendations  None recommended by OT    Recommendations for Other Services      Precautions / Restrictions Precautions Precautions: Fall Precaution Comments: Expressive > receptive aphasia, R inattention Required Braces or Orthoses: Sling (for comfort) Restrictions Weight Bearing Restrictions: Yes LUE Weight Bearing: Weight bear through elbow only Other Position/Activity Restrictions: WBAT for RW only, sling for comfort, cannot lift more than 2lbs. no ROM restrictions       Mobility Bed Mobility Overal bed mobility: Needs Assistance Bed Mobility: Supine to Sit     Supine to sit: Min assist     General bed mobility comments: patient initiating transfer immediately upon seeing therapist, min A to come into sitting with assist provided with RUE and support at back    Transfers Overall transfer level: Needs assistance Equipment used: 1 person hand held assist Transfers: Sit to/from Stand Sit to Stand: Mod assist           General transfer comment: pt impulsive and powered up prior to therapist being ready, mod A of 1 to pivot to [facility], then min A of 1 with hand held assist to ambulate a few steps to recliner     Balance Overall balance assessment: History of Falls;Needs assistance Sitting-balance support: Feet supported;Single extremity supported Sitting balance-Leahy Scale: Fair Sitting balance - Comments: needs occasional UE support sitting EOB   Standing balance support: During functional activity;Single extremity supported Standing balance-Leahy Scale: Fair                             ADL either performed or assessed with clinical judgement   ADL Overall ADL's : Needs assistance/impaired  Lower Body Dressing: Maximal assistance   Toilet Transfer: Moderate assistance;Stand-pivot;BSC/3in1 Armed forces technical officer Details (indicate cue type and reason): posterior  lean Toileting- Clothing Manipulation and Hygiene: Sit to/from stand;Moderate assistance Toileting - Clothing Manipulation Details (indicate cue type and reason): able to complete peri care in standing with mod A for standing balance support from therapist     Functional mobility during ADLs: Moderate assistance General ADL Comments: Session focus on splint check, toilet transfer to 3n1, transfer to recliner and AAROM exercises to LUE    Extremity/Trunk Assessment              Vision       Perception     Praxis      Cognition Arousal/Alertness: Awake/alert Behavior During Therapy: WFL for tasks assessed/performed;Restless Overall Cognitive Status: Difficult to assess                                 General Comments: Pt will follow an occasional command with max gestural cues          Exercises General Exercises - Upper Extremity Shoulder Flexion: AAROM;Left;5 reps Shoulder Extension: AAROM;Left;5 reps Elbow Flexion: AAROM;Left;10 reps Elbow Extension: AAROM;Left;10 reps   Shoulder Instructions       General Comments      Pertinent Vitals/ Pain       Pain Assessment: Faces Faces Pain Scale: Hurts a little bit Pain Location: Lt UE with ROM Pain Descriptors / Indicators: Grimacing;Guarding Pain Intervention(s): Limited activity within patient's tolerance;Monitored during session;Repositioned  Home Living                                          Prior Functioning/Environment              Frequency  Min 2X/week        Progress Toward Goals  OT Goals(current goals can now be found in the care plan section)  Progress towards OT goals: Progressing toward goals  Acute Rehab OT Goals OT Goal Formulation: Patient unable to participate in goal setting Time For Goal Achievement: 08/04/21 Potential to Achieve Goals: Good  Plan Discharge plan remains appropriate    Co-evaluation                 AM-PAC OT "6  Clicks" Daily Activity     Outcome Measure   Help from another person eating meals?: A Little Help from another person taking care of personal grooming?: A Little Help from another person toileting, which includes using toliet, bedpan, or urinal?: A Little Help from another person bathing (including washing, rinsing, drying)?: A Lot Help from another person to put on and taking off regular upper body clothing?: A Lot Help from another person to put on and taking off regular lower body clothing?: A Lot 6 Click Score: 15    End of Session Equipment Utilized During Treatment: Other (comment) (wrist cock-up slint doffed)  OT Visit Diagnosis: Unsteadiness on feet (R26.81);Muscle weakness (generalized) (M62.81);History of falling (Z91.81);Apraxia (R48.2);Other symptoms and signs involving cognitive function;Other symptoms and signs involving the nervous system (R29.898);Cognitive communication deficit (R41.841);Pain Pain - Right/Left: Left Pain - part of body: Arm;Shoulder   Activity Tolerance Patient tolerated treatment well   Patient Left in chair;with call bell/phone within reach;with chair alarm set   Nurse Communication Mobility status;Other (comment) (Splint doffed, will create  splint schedule)        Time: 7628-3151 OT Time Calculation (min): 19 min  Charges: OT General Charges $OT Visit: 1 Visit OT Treatments $Self Care/Home Management : 8-22 mins $Orthotics Fit/Training: 8-22 mins  Corinne Ports E. Eleri Ruben, COTA/L Acute Rehabilitation Services [place] 07/25/2021, 2:34 PM

## 2021-07-25 NOTE — Progress Notes (Signed)
OT Contact Note  Patient Details Name: Mckenzie Guerrero MRN: [id number] DOB: [birth year]   Contact note: Splint donned at 1500 with red mark below MCPs having fully dissipated. Of note, there is a small abrasion in the web space of thumb and index, therefore this therapist believes this was there prior to donning of the splint during the initial trial. To ensure utmost safely the strap that affixes at the web space was donned with extra room to provide no further irritation. Patients arm elevated higher in bed to prevent pooling between splint and elbow. Another OT will reassess skin at 1700 to determine appropriate splint schedule for this patient. RN informed and in agreement.   Corinne Ports E. Mariam Helbert, COTA/L Acute Rehabilitation Services [phone] [place] 07/25/2021, 3:21 PM

## 2021-07-26 DIAGNOSIS — S42202A Unspecified fracture of upper end of left humerus, initial encounter for closed fracture: Secondary | ICD-10-CM | POA: Diagnosis not present

## 2021-07-26 NOTE — Progress Notes (Signed)
° °  HD#9 SUBJECTIVE:  Patient Summary: Mckenzie Guerrero is a 71 y.o. with a pertinent PMH of HTN, Afib on Eliquis, and recent CVA admitted from 11/28-12/3, who presented with a fall and admitted for a COVID-19 and left midshaft humerus fracture s/p ORIF on 1/9.   Overnight Events: No acute events overnight  Interim History: This is hospital day 9 for this patient who was seen and evaluated this morning. Patient seen in her bedside chair. Does not appear to be in any pain, and overall, looks well.   OBJECTIVE:  Vital Signs: Vitals:   07/25/21 1141 07/25/21 1417 07/25/21 2125 07/26/21 0542  BP:   (!) 137/52 113/74  Pulse:   93 86  Resp:   20 13  Temp:   97.8 F (36.6 C) 97.8 F (36.6 C)  TempSrc:   Oral Oral  SpO2: 98% 98% 100% 99%  Weight:      Height:       Supplemental O2: Room Air SpO2: 99 % O2 Flow Rate (L/min): 2 L/min  Filed Weights   07/17/21 2155  Weight: 58.9 kg     Intake/Output Summary (Last 24 hours) at 07/26/2021 0633 Last data filed at 07/25/2021 0920 Gross per 24 hour  Intake 785.38 ml  Output --  Net 785.38 ml   Net IO Since Admission: 8,425.93 mL [07/26/21 0633]  Physical Exam: General: Pleasant, elderly female resting comfortably in bedside chair. No acute distress. CV: RRR. No LE edema Pulmonary: Lungs CTAB. Normal effort.  Extremities: L wrist in splint for wrist drop. L arm with good ROM Neuro: Awake, alert. Expressive aphasia.     ASSESSMENT/PLAN:  Assessment: Principal Problem:   Humerus fracture Active Problems:   HTN (hypertension)   AF (paroxysmal atrial fibrillation) (HCC)   COVID-19 virus infection   Plan: #Left midshaft humerus fracture s/p ORIF on 1/9 POD 6 following ORIF with ortho. Patient now has L wrist splint in place, as she was noted to have a wrist drop. Still pending SNF placement on/around 1/17, as she is still under COVID precautions. Continues to require a few doses of prn norco each day. She will require a post op  follow up with ortho in about 2 weeks.  - Tylenol 1000 mg tid scheduled - Norco 5-325 mg q6h prn for moderate pain - Norco 7.5-325 mg q6h for severe pain - L wrist splint for support  - PT/OT as able  #UTI Patient unable to endorse any urinary symptoms at this time, secondary to her expressive aphasia.  - Macrobid 100 mg bid, day 2/5  #COVID-19 Tested positive on 1/6, airborne and contact precautions to continue through 1/16. Will be out of precautions on 1/17 and able to go to SNF at this point. Patient remains on room air.    Best Practice: Diet: Cardiac diet IVF: Fluids: none VTE: Eliquis Code: Full AB: Macrobid Therapy Recs: SNF Family Contact: John, to be notified. DISPO: Anticipated discharge in 2-3 days to Skilled nursing facility pending  placement and COVID precautions ending .  Signature: Buddy Duty, D.O.  Internal Medicine Resident, PGY-1 [facility]  Pager: [phone] 6:33 AM, 07/26/2021   Please contact the on call pager after 5 pm and on weekends at [phone].

## 2021-07-26 NOTE — Plan of Care (Signed)
  Problem: Education: Goal: Knowledge of General Education information will improve Description: Including pain rating scale, medication(s)/side effects and non-pharmacologic comfort measures Outcome: Not Progressing   Problem: Health Behavior/Discharge Planning: Goal: Ability to manage health-related needs will improve Outcome: Not Progressing   

## 2021-07-26 NOTE — Progress Notes (Signed)
SPLINT  Splint wear schedule: On 4 hours Off 4 hours  Please check splint every 4 hours during shift ( remove splint) to assess for: * pain * redness *swelling  If any symptoms above present remove splint for 15 minutes. If symptoms continue - keep the splint removed and notify OT staff [phone] immediately.   Keep the UE elevated at all times on pillows / towels.  OT checking splint again this session with noted pressure at the webspace even with loose strap. Recommend Splint to be off for several hours. Pt and friend sitting at EOB. Friend encouraging pt to use L hand and demonstrates wrist activation with the cues "pretend you are driving a motorcycle" Pt demonstrates decrease digit extension and using R hand to help with extension of digits. OT to further assess L wrist cock up splint. Pt may benefit from radial nerve palsy splint to help with digit extension which also would not require strapping at the webspace.   Fleeta Emmer, OTR/L  Acute Rehabilitation Services Pager: [phone] Office: [phone] .

## 2021-07-26 NOTE — Progress Notes (Signed)
Occupational Therapy Treatment Patient Details Name: Mckenzie Guerrero MRN: [id number] DOB: [birth year] Today's Date: 07/26/2021   History of present illness 71 y.o. female s/p fall and admission 1/6 with L humerus fx, s/p ORIF 1/9.Marland Kitchen MRI + new areas of acute CVA in L MCA territory. + for COVID.  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   OT comments  Pt tolerating splint at this time with gel added to strap. Ot will further assess skin in 4 hours to see if pt able to tolerate. Pt attempting indep chair transfers prior to arrival with chair alarm sounding. Pt if fall risk and lack awareness to fall risk. Pt attempting to gather adl items needed for meal showing awareness for need to wear dentures. Recommendation for SNF at this time.     Recommendations for follow up therapy are one component of a multi-disciplinary discharge planning process, led by the attending physician.  Recommendations may be updated based on patient status, additional functional criteria and insurance authorization.    Follow Up Recommendations  Skilled nursing-short term rehab (<3 hours/day)    Assistance Recommended at Discharge Frequent or constant Supervision/Assistance  Patient can return home with the following  A lot of help with bathing/dressing/bathroom;Assistance with cooking/housework;Direct supervision/assist for financial management;Direct supervision/assist for medications management;Assist for transportation;Help with stairs or ramp for entrance;A lot of help with walking and/or transfers   Equipment Recommendations  None recommended by OT    Recommendations for Other Services PT consult;Speech consult    Precautions / Restrictions Precautions Precautions: Fall Precaution Comments: Expressive > receptive aphasia, R inattention Required Braces or Orthoses: Sling (PRN  comfort) Restrictions Weight Bearing Restrictions: Yes LUE Weight Bearing: Weight bear through elbow only Other Position/Activity Restrictions: WBAT for RW only, sling for comfort, cannot lift more than 2lbs. no ROM restrictions       Mobility Bed Mobility               General bed mobility comments: oob on arrival    Transfers Overall transfer level: Needs assistance   Transfers: Sit to/from Stand Sit to Stand: Min guard                 Balance             Standing balance-Leahy Scale: Fair                             ADL either performed or assessed with clinical judgement   ADL Overall ADL's : Needs assistance/impaired Eating/Feeding: Set up;Sitting   Grooming: Minimal assistance;Sitting                               Functional mobility during ADLs: Min guard      Extremity/Trunk Assessment Upper Extremity Assessment Upper Extremity Assessment: LUE deficits/detail LUE Deficits / Details: wrist drop noted. pt tolerates PROM of wrist at this time. noted to have wound starting at webspace thumb and index noted by previous OTs. Splint don on arrival. Splint removed during session. OT applying gel with tegaderm wrapped over gel to hold it to splint strap. Once applied pt states "better better" pt nodding head in approval. pt states "thank you"            Vision       Perception  Praxis      Cognition Arousal/Alertness: Awake/alert Behavior During Therapy: Flat affect Overall Cognitive Status: Impaired/Different from baseline Area of Impairment: Attention;Safety/judgement;Problem solving                   Current Attention Level: Selective     Safety/Judgement: Decreased awareness of safety;Decreased awareness of deficits   Problem Solving: Slow processing;Difficulty sequencing General Comments: pt up in room with chair alarm sounding. pt getting teeth for break fast with tooth paste instead of glue. OT  finding the glue and giving dentures back to patient to apply. pt attempting to apply them to top when they are the bottom denture. pt needs cues to sequence task. pt eating appropriately.          Exercises Other Exercises Other Exercises: PROM L digits, wrist forearm Other Exercises: elevated on foam and towels with ice applied   Shoulder Instructions       General Comments addressed above L UE splint    Pertinent Vitals/ Pain       Pain Assessment: Faces Faces Pain Scale: Hurts a little bit Pain Location: L UE Pain Descriptors / Indicators: Grimacing Pain Intervention(s): Monitored during session;Repositioned;Other (comment) (pt states "Better" when applied with gel)  Home Living                                          Prior Functioning/Environment              Frequency  Min 2X/week        Progress Toward Goals  OT Goals(current goals can now be found in the care plan section)  Progress towards OT goals: Progressing toward goals  Acute Rehab OT Goals Patient Stated Goal: none stated OT Goal Formulation: Patient unable to participate in goal setting Time For Goal Achievement: 08/04/21 Potential to Achieve Goals: Good ADL Goals Pt Will Perform Upper Body Bathing: with min assist Pt Will Perform Lower Body Bathing: with min assist Pt Will Perform Upper Body Dressing: with min guard assist;sitting Pt Will Transfer to Toilet: with min assist;bedside commode;stand pivot transfer Pt Will Perform Toileting - Clothing Manipulation and hygiene: with mod assist;sitting/lateral leans  Plan Discharge plan remains appropriate    Co-evaluation                 AM-PAC OT "6 Clicks" Daily Activity     Outcome Measure   Help from another person eating meals?: A Little Help from another person taking care of personal grooming?: A Little Help from another person toileting, which includes using toliet, bedpan, or urinal?: A Lot Help from  another person bathing (including washing, rinsing, drying)?: A Lot Help from another person to put on and taking off regular upper body clothing?: A Lot Help from another person to put on and taking off regular lower body clothing?: A Lot 6 Click Score: 14    End of Session    OT Visit Diagnosis: Unsteadiness on feet (R26.81);Muscle weakness (generalized) (M62.81);History of falling (Z91.81);Apraxia (R48.2);Other symptoms and signs involving cognitive function;Other symptoms and signs involving the nervous system (R29.898);Cognitive communication deficit (R41.841);Pain Pain - Right/Left: Left Pain - part of body: Arm;Shoulder   Activity Tolerance Patient tolerated treatment well   Patient Left in chair;with call bell/phone within reach;with chair alarm set   Nurse Communication Mobility status;Precautions;Weight bearing status        Time: [phone]  OT Time Calculation (min): 32 min  Charges: OT General Charges $OT Visit: 1 Visit OT Treatments $Self Care/Home Management : 8-22 mins $Orthotics/Prosthetics Check: 8-22 mins   Brynn, OTR/L  Acute Rehabilitation Services Pager: [phone] Office: [phone] .   Jeri Modena 07/26/2021, 9:59 AM

## 2021-07-27 NOTE — TOC Progression Note (Signed)
Transition of Care [facility]) - Progression Note    Patient Details  Name: Mckenzie Guerrero MRN: [id number] Date of Birth: 1951/06/25  [facility]) CM/SW Contact  Joanne Chars, LCSW Phone Number: 07/27/2021, 1:12 PM  Clinical Narrative:   Josem Kaufmann request facility changed to [facility] in [place].  Carol at [facility] confirmed that pt can come tomorrow.    Expected Discharge Plan: [place] Barriers to Discharge: Continued Medical Work up, Ship broker, SNF Pending bed offer  Expected Discharge Plan and Services Expected Discharge Plan: [place] In-house Referral: Clinical Social Work                                             Social Determinants of Health (SDOH) Interventions    Readmission Risk Interventions No flowsheet data found.

## 2021-07-27 NOTE — Progress Notes (Signed)
Occupational Therapy Treatment Patient Details Name: Mckenzie Guerrero MRN: [id number] DOB: 08/27/1950 Today's Date: 07/27/2021   History of present illness 71 y.o. female s/p fall and admission 1/6 with L humerus fx, s/p ORIF 1/9.Marland Kitchen MRI + new areas of acute CVA in L MCA territory. + for COVID.  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   OT comments  Pt progressing towards goals, able to complete ADLs and transfers min A with increased v/cing for sequencing and safety. Splint check performed, wound still present on dorsum area of webspace, splint left off for remainder of day, bandage applied to webspace, RN notified. AAROM performed for composite flexion/extension of digits, AAROM/PROM wrist flexion/extension. Pt L hand presenting with weak grasp, unable to actively reach for objects, but is able to pass objects from R hand and place them in L hand when asked. Pt presenting with impairments listed below, will continue to follow acutely. Recommend SNF at d/c.   Recommendations for follow up therapy are one component of a multi-disciplinary discharge planning process, led by the attending physician.  Recommendations may be updated based on patient status, additional functional criteria and insurance authorization.    Follow Up Recommendations  Skilled nursing-short term rehab (<3 hours/day)    Assistance Recommended at Discharge Frequent or constant Supervision/Assistance  Patient can return home with the following  A lot of help with bathing/dressing/bathroom;Assistance with cooking/housework;Direct supervision/assist for financial management;Direct supervision/assist for medications management;Assist for transportation;Help with stairs or ramp for entrance;A lot of help with walking and/or transfers   Equipment Recommendations  None recommended by OT;Other (comment) (defer to next venue of  care)    Recommendations for Other Services PT consult;Speech consult    Precautions / Restrictions Precautions Precautions: Fall Precaution Comments: Expressive > receptive aphasia, R inattention Required Braces or Orthoses: Sling;Other Brace Other Brace: L wrist splint Restrictions Weight Bearing Restrictions: Yes LUE Weight Bearing: Weight bearing as tolerated Other Position/Activity Restrictions: WBAT for RW only, sling for comfort, cannot lift more than 2lbs. no ROM restrictions       Mobility Bed Mobility Overal bed mobility: Needs Assistance Bed Mobility: Sit to Supine       Sit to supine: Min guard;HOB elevated        Transfers Overall transfer level: Needs assistance Equipment used: 1 person hand held assist Transfers: Sit to/from Stand Sit to Stand: Min assist           General transfer comment: RW not used due to LUE in sling     Balance Overall balance assessment: Needs assistance Sitting-balance support: Feet supported;No upper extremity supported Sitting balance-Leahy Scale: Fair Sitting balance - Comments: needs occasional UE support sitting EOB   Standing balance support: Single extremity supported;During functional activity Standing balance-Leahy Scale: Fair                             ADL either performed or assessed with clinical judgement   ADL Overall ADL's : Needs assistance/impaired Eating/Feeding: Set up;Sitting Eating/Feeding Details (indicate cue type and reason): sitting EOB eating upon arrival Grooming: Brushing hair;Wash/dry face;Sitting;Standing Grooming Details (indicate cue type and reason): able to brush hair in front of mirror when given comb             Lower Body Dressing: Min guard;Cueing for sequencing;Sitting/lateral leans Lower Body Dressing Details (indicate cue type and reason):  able to pull up socks with increased v/cing Toilet Transfer: Minimal assistance;Stand-pivot;BSC/3in1 Toilet Transfer  Details (indicate cue type and reason): posterior lean Toileting- Clothing Manipulation and Hygiene: Total assistance Toileting - Clothing Manipulation Details (indicate cue type and reason): pericare completed in standing     Functional mobility during ADLs: Minimal assistance General ADL Comments: Spint check performed along with toileting and grooming task    Extremity/Trunk Assessment Upper Extremity Assessment Upper Extremity Assessment: LUE deficits/detail LUE Deficits / Details: wrist drop noted. pt tolerates PROM of wrist and digits at this time. noted to have wound starting at dorsum of webspace thumb and index ~48mm in diameter noted by previous OTs. Splint don on arrival. Splint removed, and left off for remainder of day, bandage applied to webspace of hand for wound healing. Will reassess tomorrow LUE Coordination: decreased gross motor;decreased fine motor   Lower Extremity Assessment Lower Extremity Assessment: Defer to PT evaluation        Vision   Vision Assessment?: Vision impaired- to be further tested in functional context   Perception Perception Perception: Not tested   Praxis Praxis Praxis: Not tested    Cognition Arousal/Alertness: Awake/alert Behavior During Therapy: Restless Overall Cognitive Status: Impaired/Different from baseline Area of Impairment: Attention;Safety/judgement;Problem solving                   Current Attention Level: Selective     Safety/Judgement: Decreased awareness of safety;Decreased awareness of deficits   Problem Solving: Slow processing;Difficulty sequencing            Exercises Exercises: General Upper Extremity General Exercises - Upper Extremity Wrist Flexion: AAROM;5 reps;Left;Seated Wrist Extension: PROM;5 reps;Seated;Left Digit Composite Flexion: AAROM;Left;5 reps;Seated Composite Extension: AAROM;Left;5 reps;Seated   Shoulder Instructions       General Comments positioned LUE with supportive  pillows/blankets to elevate    Pertinent Vitals/ Pain       Pain Assessment: Faces Pain Score: 2  Faces Pain Scale: Hurts a little bit Pain Location: L UE Pain Descriptors / Indicators: Grimacing;Guarding;Moaning Pain Intervention(s): Limited activity within patient's tolerance;Monitored during session;Repositioned  Home Living                                          Prior Functioning/Environment              Frequency  Min 2X/week        Progress Toward Goals  OT Goals(current goals can now be found in the care plan section)  Progress towards OT goals: Progressing toward goals  Acute Rehab OT Goals Patient Stated Goal: none stated OT Goal Formulation: Patient unable to participate in goal setting Time For Goal Achievement: 08/04/21 Potential to Achieve Goals: Good ADL Goals Pt Will Perform Upper Body Bathing: with min assist Pt Will Perform Lower Body Bathing: with min assist Pt Will Perform Upper Body Dressing: with min guard assist;sitting Pt Will Transfer to Toilet: with min assist;bedside commode;stand pivot transfer Pt Will Perform Toileting - Clothing Manipulation and hygiene: with mod assist;sitting/lateral leans  Plan Discharge plan remains appropriate    Co-evaluation                 AM-PAC OT "6 Clicks" Daily Activity     Outcome Measure   Help from another person eating meals?: A Little Help from another person taking care of personal grooming?: A Little Help from another person toileting,  which includes using toliet, bedpan, or urinal?: A Lot Help from another person bathing (including washing, rinsing, drying)?: A Lot Help from another person to put on and taking off regular upper body clothing?: A Lot Help from another person to put on and taking off regular lower body clothing?: A Lot 6 Click Score: 14    End of Session Equipment Utilized During Treatment: Other (comment) (Wrist cock up splint and sling)  OT Visit  Diagnosis: Unsteadiness on feet (R26.81);Muscle weakness (generalized) (M62.81);History of falling (Z91.81);Apraxia (R48.2);Other symptoms and signs involving cognitive function;Other symptoms and signs involving the nervous system (R29.898);Cognitive communication deficit (R41.841);Pain Pain - Right/Left: Left Pain - part of body: Hand;Arm   Activity Tolerance Patient tolerated treatment well   Patient Left in bed;with bed alarm set;with call bell/phone within reach   Nurse Communication Mobility status;Precautions;Weight bearing status        Time: 8264-1583 OT Time Calculation (min): 42 min  Charges: OT General Charges $OT Visit: 1 Visit OT Treatments $Self Care/Home Management : 8-22 mins $Therapeutic Activity: 8-22 mins $Orthotics/Prosthetics Check: 8-22 mins  Lynnda Child, OTD, OTR/L Acute Rehab [phone] [place] 07/27/2021, 4:49 PM

## 2021-07-27 NOTE — Progress Notes (Signed)
Physical Therapy Treatment Patient Details Name: Mckenzie Guerrero MRN: [id number] DOB: 1950-12-25 Today's Date: 07/27/2021   History of Present Illness 71 y.o. female s/p fall and admission 1/6 with L humerus fx, s/p ORIF 1/9.Marland Kitchen MRI + new areas of acute CVA in L MCA territory. + for COVID.  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    PT Comments    Received pt sitting in recliner. Session with emphasis on functional mobility, gait training, and safety awareness. Pt performed all transfers with RW and min A (L wrist splint donned). Pt ambulated throughout room with RW and min A with manual facilitation to control RW due to R inattention. Returned to recliner and pt able to communicate to therapist that she wanted to stay sitting and rest. Elevated LUE on pillows for comfort and edema management and pt appeared to be in less discomfort. Acute PT to cont to follow.     Recommendations for follow up therapy are one component of a multi-disciplinary discharge planning process, led by the attending physician.  Recommendations may be updated based on patient status, additional functional criteria and insurance authorization.  Follow Up Recommendations  Skilled nursing-short term rehab (<3 hours/day)     Assistance Recommended at Discharge Frequent or constant Supervision/Assistance  Patient can return home with the following A little help with bathing/dressing/bathroom;Assistance with cooking/housework;Assist for transportation;Help with stairs or ramp for entrance;A little help with walking and/or transfers   Equipment Recommendations  Rolling walker (2 wheels)    Recommendations for Other Services       Precautions / Restrictions Precautions Precautions: Fall Precaution Comments: Expressive > receptive aphasia, R inattention Required Braces or Orthoses: Sling;Other Brace (for  comfort) Other Brace: L wrist splint Restrictions Weight Bearing Restrictions: Yes LUE Weight Bearing: Weight bearing as tolerated Other Position/Activity Restrictions: WBAT for RW only, sling for comfort, cannot lift more than 2lbs. no ROM restrictions     Mobility  Bed Mobility               General bed mobility comments: sitting in recliner upon arrival Patient Response: Cooperative  Transfers Overall transfer level: Needs assistance Equipment used: Rolling walker (2 wheels) Transfers: Sit to/from Stand Sit to Stand: Min assist           General transfer comment: pt stood from recliner and from couch in room using RW and min guard/min A (from low surface) - required cues for hand placement on RW with caution for LUE    Ambulation/Gait Ambulation/Gait assistance: Min assist (54ft x 2) Gait Distance (Feet): 30 Feet Assistive device: Rolling walker (2 wheels) Gait Pattern/deviations: Step-to pattern;Decreased step length - right;Decreased step length - left;Decreased stride length;Trunk flexed;Narrow base of support Gait velocity: decreased Gait velocity interpretation: <1.31 ft/sec, indicative of household ambulator   General Gait Details: Pt required cues for R attention and manual assist to steer RW as pt bumping into objects on R side and unable to problem solve how to navigate around them.   Stairs             Wheelchair Mobility    Modified Rankin (Stroke Patients Only)       Balance Overall balance assessment: Needs assistance Sitting-balance support: Feet supported;No upper extremity supported Sitting balance-Leahy Scale: Fair     Standing balance support: Bilateral upper extremity supported (RW) Standing balance-Leahy Scale: Fair Standing balance comment: requires min guard for  static standing balance with cues for R attention                            Cognition Arousal/Alertness: Awake/alert Behavior During Therapy:  Restless Overall Cognitive Status: Impaired/Different from baseline                           Safety/Judgement: Decreased awareness of safety;Decreased awareness of deficits   Problem Solving: Slow processing;Difficulty sequencing General Comments: pt sitting in recliner upon arrival. Per RN, pt has been impulsive attempting to get up - therefore focused on ambulation to help with restlesness        Exercises      General Comments        Pertinent Vitals/Pain Pain Assessment: Faces Faces Pain Scale: Hurts a little bit Breathing: normal Negative Vocalization: occasional moan/groan, low speech, negative/disapproving quality Facial Expression: sad, frightened, frown Body Language: relaxed Consolability: no need to console PAINAD Score: 2 Pain Location: L UE Pain Descriptors / Indicators: Grimacing;Guarding;Moaning Pain Intervention(s): Monitored during session;Repositioned;Other (comment) (pt appeared to be in less discomfort when therapist elevated LUE)    Home Living                          Prior Function            PT Goals (current goals can now be found in the care plan section) Acute Rehab PT Goals Patient Stated Goal: unable to state PT Goal Formulation: With family Time For Goal Achievement: 08/01/21 Potential to Achieve Goals: Fair Progress towards PT goals: Progressing toward goals    Frequency    Min 3X/week      PT Plan Current plan remains appropriate    Co-evaluation              AM-PAC PT "6 Clicks" Mobility   Outcome Measure  Help needed turning from your back to your side while in a flat bed without using bedrails?: A Little Help needed moving from lying on your back to sitting on the side of a flat bed without using bedrails?: A Little Help needed moving to and from a bed to a chair (including a wheelchair)?: A Lot Help needed standing up from a chair using your arms (e.g., wheelchair or bedside chair)?: A  Little Help needed to walk in hospital room?: A Little Help needed climbing 3-5 steps with a railing? : A Lot 6 Click Score: 16    End of Session Equipment Utilized During Treatment: Gait belt Activity Tolerance: Patient tolerated treatment well Patient left: in chair;with call bell/phone within reach;with chair alarm set Nurse Communication: Mobility status PT Visit Diagnosis: Unsteadiness on feet (R26.81);Muscle weakness (generalized) (M62.81);Pain;Other abnormalities of gait and mobility (R26.89);Hemiplegia and hemiparesis Hemiplegia - Right/Left: Right Pain - Right/Left: Left Pain - part of body: Arm     Time: 1610-9604 PT Time Calculation (min) (ACUTE ONLY): 11 min  Charges:  $Gait Training: 8-22 mins                     Becky Sax PT, DPT  Blenda Nicely 07/27/2021, 12:10 PM

## 2021-07-27 NOTE — Progress Notes (Signed)
° °  HD#10 SUBJECTIVE:  Patient Summary: Mckenzie Guerrero is a 71 y.o. with a pertinent PMH of HTN, Afib on Eliquis, and recent CVA admitted from 11/28-12/3, who presented with a fall and admitted for a COVID-19 and left midshaft humerus fracture s/p ORIF on 1/9.  Overnight Events: No acute events overnight  Interim History: This is hospital day 10 for this patient who was seen and evaluated at the bedside. She has been doing well this morning and her pain remains well controlled. Appears much improved compared to initial presentaiton.   OBJECTIVE:  Vital Signs: Vitals:   07/25/21 2125 07/26/21 0542 07/26/21 0809 07/26/21 2040  BP: (!) 137/52 113/74 107/67 (!) 112/57  Pulse: 93 86 83 90  Resp: 20 13 18 16   Temp: 97.8 F (36.6 C) 97.8 F (36.6 C) 98.1 F (36.7 C) 98.6 F (37 C)  TempSrc: Oral Oral Oral Oral  SpO2: 100% 99% 94% 96%  Weight:      Height:       Supplemental O2: Room Air SpO2: 96 %  Filed Weights   07/17/21 2155  Weight: 58.9 kg    No intake or output data in the 24 hours ending 07/27/21 6761 Net IO Since Admission: 8,425.93 mL [07/27/21 0632]  Physical Exam: General: Pleasant,elderly female laying in bed. No acute distress. CV: RRR. No LE edema Pulmonary: Lungs CTAB. Normal effort.  Extremities: L arm ROM remains stable. L wrist in splint Neuro: Awake, alert. Expressive aphasia, improved.      ASSESSMENT/PLAN:  Assessment: Principal Problem:   Humerus fracture Active Problems:   HTN (hypertension)   AF (paroxysmal atrial fibrillation) (HCC)   COVID-19 virus infection   Plan: #Left midshaft humerus fracture s/p ORIF on 1/9 POD 7 following ORIF with ortho. Recommend using the left wrist splint for her wrist drop for ~4 hours on, and then ~4 hours off.  Would not leave wrist splint on continuouslyto prevent any pressure wounds. Still pending SNF placement.  - Tylenol 1000 mg tid scheduled - Norco 5-325 mg q6h prn  - PT/OT as able - SNF placement  pending  #UTI Continue to treat with Macrobid 100 mg bid, day 3/5.   #COVID-19 Positive on 1/6, airborne/contact precautions to end this afternoon.    Best Practice: Diet: Cardiac diet IVF: Fluids: none VTE: Eliquis Code: Full AB: Macrobid  Therapy Recs: SNF Family Contact: son, [name], to be notified. DISPO: Anticipated discharge  in 1-2 days  to Skilled nursing facility pending  placement .  Signature: Buddy Duty, D.O.  Internal Medicine Resident, PGY-1 [facility]  Pager: [phone] 6:32 AM, 07/27/2021   Please contact the on call pager after 5 pm and on weekends at [phone].

## 2021-07-28 DIAGNOSIS — I480 Paroxysmal atrial fibrillation: Secondary | ICD-10-CM

## 2021-07-28 MED ORDER — NITROFURANTOIN MONOHYD MACRO 100 MG PO CAPS: 100.0000 mg | ORAL_CAPSULE | Freq: Two times a day (BID) | ORAL | 0 refills | Status: AC

## 2021-07-28 NOTE — Progress Notes (Signed)
Report given to Gerald Stabs, RN at [facility] by this nurse.

## 2021-07-28 NOTE — TOC Progression Note (Signed)
Transition of Care [facility]) - Progression Note    Patient Details  Name: Mckenzie Guerrero MRN: [id number] Date of Birth: 1950-12-30  Transition of Care [facility]) CM/SW Contact  Joanne Chars, LCSW Phone Number: 07/28/2021, 9:42 AM  Clinical Narrative:    CSW spoke with Marlowe Kays at [facility].  SNF auth approved, facility changed to [facility].  7 days effective 1/13, ref#[id number].  Ambulance transport approved [phone].     Expected Discharge Plan: [place] Barriers to Discharge: Continued Medical Work up, Ship broker, SNF Pending bed offer  Expected Discharge Plan and Services Expected Discharge Plan: [place] In-house Referral: Clinical Social Work                                             Social Determinants of Health (SDOH) Interventions    Readmission Risk Interventions No flowsheet data found.

## 2021-07-28 NOTE — Progress Notes (Signed)
Occupational Therapy Treatment Patient Details Name: Mckenzie Guerrero MRN: [id number] DOB: Apr 01, 1951 Today's Date: 07/28/2021   History of present illness 71 y.o. female s/p fall and admission 1/6 with L humerus fx, s/p ORIF 1/9.Marland Kitchen MRI + new areas of acute CVA in L MCA territory. + for COVID.  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   OT comments  Pt seen today for splint check and ROM, wound on dorsum webspace has not increased in size since previous session. Pt tolerates AAROM of wrist and digits, frequently rubbing dorsum of hand and digits. Mepilex bandage applied to webspace and gel/tegaderm applied to webspace strap of splint to support skin integrity. Leaving splint donned, will reassess in 1 hr.   Recommendations for follow up therapy are one component of a multi-disciplinary discharge planning process, led by the attending physician.  Recommendations may be updated based on patient status, additional functional criteria and insurance authorization.    Follow Up Recommendations  Skilled nursing-short term rehab (<3 hours/day)    Assistance Recommended at Discharge Frequent or constant Supervision/Assistance  Patient can return home with the following  A lot of help with bathing/dressing/bathroom;Assistance with cooking/housework;Direct supervision/assist for financial management;Direct supervision/assist for medications management;Assist for transportation;Help with stairs or ramp for entrance;A lot of help with walking and/or transfers   Equipment Recommendations  None recommended by OT;Other (comment) (defer to next venue of care)    Recommendations for Other Services PT consult;Speech consult    Precautions / Restrictions Precautions Precautions: Fall Precaution Comments: Expressive > receptive aphasia, R inattention Required Braces or Orthoses: Sling;Other  Brace Other Brace: L wrist splint Restrictions Weight Bearing Restrictions: Yes LUE Weight Bearing: Weight bearing as tolerated Other Position/Activity Restrictions: WBAT for RW only, sling for comfort, cannot lift more than 2lbs. no ROM restrictions       Mobility Bed Mobility               General bed mobility comments: sitting in recliner upon arrival    Transfers                         Balance Overall balance assessment: Needs assistance Sitting-balance support: Feet supported, No upper extremity supported Sitting balance-Leahy Scale: Fair Sitting balance - Comments: needs occasional UE support sitting EOB                                   ADL either performed or assessed with clinical judgement   ADL   Eating/Feeding: Set up;Sitting Eating/Feeding Details (indicate cue type and reason): sitting up in chair eating upon arrival                                        Extremity/Trunk Assessment Upper Extremity Assessment Upper Extremity Assessment: LUE deficits/detail LUE Deficits / Details: wrist drop noted. pt tolerates PROM of wrist and digits at this time. noted to have wound at dorsum of webspace thumb and index ~54mm in diameter noted by previous OTs, has not expanded. Tegaderm and gel added to webspace strap, as well as bandage applied. Splint donned, will reassess in 1 hr to assess skin integrity. LUE Coordination: decreased gross motor;decreased fine motor   Lower Extremity Assessment Lower  Extremity Assessment: Defer to PT evaluation        Vision   Vision Assessment?: Vision impaired- to be further tested in functional context   Perception Perception Perception: Not tested   Praxis Praxis Praxis: Not tested    Cognition Arousal/Alertness: Awake/alert Behavior During Therapy: Restless Overall Cognitive Status: Impaired/Different from baseline Area of Impairment: Attention, Safety/judgement, Problem  solving                   Current Attention Level: Selective     Safety/Judgement: Decreased awareness of safety, Decreased awareness of deficits   Problem Solving: Slow processing, Difficulty sequencing          Exercises General Exercises - Upper Extremity Wrist Flexion: AAROM, 5 reps, Left, Seated Wrist Extension: PROM, 5 reps, Seated, Left Digit Composite Flexion: AAROM, Left, 5 reps, Seated Composite Extension: AAROM, Left, 5 reps, Seated    Shoulder Instructions       General Comments LUE elevated and supported    Pertinent Vitals/ Pain       Pain Assessment Pain Assessment: Faces Pain Score: 2  Faces Pain Scale: Hurts a little bit Pain Location: L UE Pain Descriptors / Indicators: Grimacing, Guarding, Moaning Pain Intervention(s): Limited activity within patient's tolerance, Monitored during session  Home Living                                          Prior Functioning/Environment              Frequency  Min 2X/week        Progress Toward Goals  OT Goals(current goals can now be found in the care plan section)  Progress towards OT goals: Progressing toward goals  Acute Rehab OT Goals Patient Stated Goal: none stated OT Goal Formulation: Patient unable to participate in goal setting Time For Goal Achievement: 08/04/21 Potential to Achieve Goals: Good ADL Goals Pt Will Perform Upper Body Bathing: with min assist Pt Will Perform Lower Body Bathing: with min assist Pt Will Perform Upper Body Dressing: with min guard assist;sitting Pt Will Transfer to Toilet: with min assist;bedside commode;stand pivot transfer Pt Will Perform Toileting - Clothing Manipulation and hygiene: with mod assist;sitting/lateral leans  Plan Discharge plan remains appropriate    Co-evaluation                 AM-PAC OT "6 Clicks" Daily Activity     Outcome Measure   Help from another person eating meals?: A Little Help from another  person taking care of personal grooming?: A Little Help from another person toileting, which includes using toliet, bedpan, or urinal?: A Lot Help from another person bathing (including washing, rinsing, drying)?: A Lot Help from another person to put on and taking off regular upper body clothing?: A Lot Help from another person to put on and taking off regular lower body clothing?: A Lot 6 Click Score: 14    End of Session Equipment Utilized During Treatment: Other (comment) (splint and sling)  OT Visit Diagnosis: Unsteadiness on feet (R26.81);Muscle weakness (generalized) (M62.81);History of falling (Z91.81);Apraxia (R48.2);Other symptoms and signs involving cognitive function;Other symptoms and signs involving the nervous system (R29.898);Cognitive communication deficit (R41.841);Pain Pain - Right/Left: Left Pain - part of body: Hand;Arm   Activity Tolerance Patient tolerated treatment well   Patient Left in bed;with bed alarm set;with call bell/phone within reach   Nurse Communication Mobility  status;Precautions;Weight bearing status        Time: 1901-2224 OT Time Calculation (min): 34 min  Charges: OT General Charges $OT Visit: 1 Visit OT Treatments $Therapeutic Activity: 8-22 mins $Orthotics Fit/Training: 8-22 mins  Lynnda Child, OTD, OTR/L Acute Rehab [phone] - [place] 07/28/2021, 10:44 AM

## 2021-07-28 NOTE — Discharge Summary (Addendum)
Name: Mckenzie Guerrero MRN: [id number] DOB: 02/12/1951 71 y.o. PCP: Raina Mina., MD  Date of Admission: 07/17/2021  8:16 AM Date of Discharge:  07/28/2021 Attending Physician: Dr. Angelia Mould  DISCHARGE DIAGNOSIS:  Primary Problem: Humerus fracture   Hospital Problems: Principal Problem:   Humerus fracture Active Problems:   HTN (hypertension)   AF (paroxysmal atrial fibrillation) (HCC)   COVID-19 virus infection    DISCHARGE MEDICATIONS:   Allergies as of 07/28/2021       Reactions   Aspirin Other (See Comments)   unknown   Codeine Hives, Itching   Doxycycline Nausea And Vomiting   Nsaids Other (See Comments)   Gi bleeding   Sulfonamide Derivatives Swelling   Prednisone Anxiety        Medication List     TAKE these medications    acetaminophen 325 MG tablet Commonly known as: TYLENOL Take 2 tablets (650 mg total) by mouth 3 (three) times daily.   atorvastatin 40 MG tablet Commonly known as: LIPITOR Take 1 tablet (40 mg total) by mouth daily.   B-complex with vitamin C tablet Take 1 tablet by mouth daily.   Eliquis 5 MG Tabs tablet Generic drug: apixaban Take 1 tablet (5 mg total) by mouth 2 (two) times daily.   Gerhardt's butt cream Crea Apply 1 application topically 3 (three) times daily. What changed:  when to take this reasons to take this   HYDROcodone-acetaminophen 5-325 MG tablet Commonly known as: NORCO/VICODIN Take 1-2 tablets by mouth every 6 (six) hours as needed for moderate pain or severe pain (Use 1 tablet for pain score 4-6, use 2 tablets for pain score 7-10).   lidocaine 5 % Commonly known as: LIDODERM Place 1 patch onto the skin daily. To lower back--on at 8 am and remove at 8 pm daily. Has to be off for 12 hours   megestrol 40 MG/ML suspension Commonly known as: MEGACE Take 10 mLs (400 mg total) by mouth daily.   metoprolol succinate 25 MG 24 hr tablet Commonly known as: TOPROL-XL Take 1/2 tablet (12.5 mg total) by mouth  daily.   nitrofurantoin (macrocrystal-monohydrate) 100 MG capsule Commonly known as: MACROBID Take 1 capsule (100 mg total) by mouth every 12 (twelve) hours for 3 doses.        DISPOSITION AND FOLLOW-UP:  Mckenzie Guerrero was discharged from [facility] in Stable condition. At the hospital follow up visit please address:  Left midshaft humerus fracture: Assess pain and her left radial nerve palsy. May need left wrist splint   Follow-up Recommendations: Consults: Orthopedic surgery Labs:  none Studies: none Medications: no new medication changes   Follow-up Appointments:  Contact information for follow-up providers     Frann Rider, NP. Go on 08/31/2021.   Specialty: Neurology Why: stroke clinic Contact information: [address] [place] [address] [phone]         Shona Needles, MD. Schedule an appointment as soon as possible for a visit in 2 week(s).   Specialty: Orthopedic Surgery Why: for wound check and repeat x-rays Contact information: [place] [address] [phone]         Raina Mina., MD Follow up.   Specialty: Internal Medicine Contact information: [place] [address] [phone]              Contact information for after-discharge care     Destination     [facility] Preferred  SNF .   Service: Skilled Nursing Contact information: [address] [place] COURSE:  Patient Summary: #Left midshaft humerus fracture Patient admitted on 11/29 with left MCA infarct with evidence of hemorrhagic transformation and trace right midline shift. Patient admitted to CIR from 12/3 to 12/20 and was discharged to home after. Prior to admission, patient fell and attempted to catch herself with her left upper extremity, thus sustaining a left midshaft humeral fracture. CT head,  cervical spine, DG thoracic/lumbar spine, pelvis, and chest with no acute abnormalities. Initial concern was that this patient sustained a new cerebral infarction, however, after discussion with neuro and radiology, it was determined that MRI findings were consistent with previous embolic infarction. Fall could have been secondary to previous infarction vs COVID infection. Patient had ORIF with ortho on 1/9. She was deemed safe for weightbearing with walker, although recommend minimizing weightbearing with LUE. Will follow up with ortho in 2 weeks from surgery. Patient is also noted to have a wrist drop, consistent with a radial nerve injury from her left midshaft humerus fracture. She was given a wrist splint by OT, and recommend using this as needed. When using the splint, recommend 4 hrs on, and 4 hrs off, to prevent any pressure wounds. Stable for discharge to SNF on 1/17. Of note, the patient developed a radial nerve palsy following ORIF L mid shaft humerus fracture 07/20/21. She has been placed in a cock-up wrist splint while inpatient. Plan to have patient follow-up in 1-2 weeks with Dr. Doreatha Martin in [place] clinic for re-evaluation and we will continue monitor the recovery of the radial nerve. The patient may require a splint be fabricated as an outpatient after assessed at her return ortho visit. Pt to be assessed for a radial nerve palsy splint as indicated after follow up with orthopedics  #Paroxysmal Afib on Eliquis #Hypertension  Patient remained in normal sinus rhythm during admission. Resumed anticoagulation with eliquis 5 mg bid after surgery and continued metoprolol 12.5 mg daily.   #Recent embolic cerebral infarction Anticoagulation with eliquis 5 mg bid as noted above. Continued lipitor 40 mg daily  #COVID-19  Tested positive on 1/6. Remained asymptomatic, and saturated well on room air. There was little benefit for starting antivirals or steroids at this point.    DISCHARGE INSTRUCTIONS:    Discharge Instructions     Call MD for:  severe uncontrolled pain   Complete by: As directed    Call MD for:  temperature >100.4   Complete by: As directed    Diet - low sodium heart healthy   Complete by: As directed    Increase activity slowly   Complete by: As directed    No wound care   Complete by: As directed      Dear Mckenzie Guerrero, You were hospitalized after you fell and fractured your left arm. You received surgery on 1/9 and are now being discharged to a skilled nursing facility for rehab, so we can build up the strength in your arm, and hopefully prevent any future falls. Please follow up with the orthopedic surgeons in ~2 weeks for a post-op visit.   No new changes were made to your medications while you were hospitalized. I am glad you are feeling better!  SUBJECTIVE:  Mckenzie Guerrero was seen and evaluated on the day of discharge. She was sitting up  in her bedside chair and eating breakfast. Overall she is feeling much better and has no acute complaints.   Discharge Vitals:   BP 102/86 (BP Location: Right Arm)    Pulse 98    Temp 98 F (36.7 C) (Oral)    Resp 18    Ht 5\' 2"  (1.575 m)    Wt 58.9 kg    SpO2 99%    BMI 23.75 kg/m   OBJECTIVE:   General: Pleasant,elderly female sitting in bedside chair. No acute distress. CV: RRR. No murmurs. No LE edema Pulmonary: Lungs CTAB. Normal effort.  Abdominal: Soft, nontender, nondistended.  Extremities: L wrist drop, in splint. L arm ROM stable Skin: Warm and dry. Ecchymosis on L arm Neuro: Awake, alert. Expressive aphasia improved   Pertinent Labs, Studies, and Procedures:  CBC Latest Ref Rng & Units 07/24/2021 07/23/2021 07/22/2021  WBC 4.0 - 10.5 K/uL 14.5(H) 13.3(H) 15.8(H)  Hemoglobin 12.0 - 15.0 g/dL 9.1(L) 9.3(L) 9.4(L)  Hematocrit 36.0 - 46.0 % 28.0(L) 29.0(L) 29.1(L)  Platelets 150 - 400 K/uL 494(H) 439(H) 379    CMP Latest Ref Rng & Units 07/24/2021 07/23/2021 07/22/2021  Glucose 70 - 99 mg/dL 86 90 94  BUN 8  - 23 mg/dL 9 12 10   Creatinine 0.44 - 1.00 mg/dL 0.54 0.46 0.59  Sodium 135 - 145 mmol/L 138 138 138  Potassium 3.5 - 5.1 mmol/L 4.0 3.4(L) 4.0  Chloride 98 - 111 mmol/L 108 106 108  CO2 22 - 32 mmol/L 19(L) 21(L) 21(L)  Calcium 8.9 - 10.3 mg/dL 8.9 8.4(L) 8.6(L)  Total Protein 6.5 - 8.1 g/dL - - -  Total Bilirubin 0.3 - 1.2 mg/dL - - -  Alkaline Phos 38 - 126 U/L - - -  AST 15 - 41 U/L - - -  ALT 0 - 44 U/L - - -     DG Humerus Left  Result Date: 07/17/2021 CLINICAL DATA:  Fall, pain and deformity EXAM: LEFT HUMERUS - 2+ VIEW COMPARISON:  07/17/2021 FINDINGS: There is an acute displaced and minimally comminuted left humerus midshaft fracture. Butterfly fragment noted. Bones are osteopenic. IMPRESSION: Acute left humerus midshaft fracture. Electronically Signed   By: Jerilynn Mages.  Shick M.D.   On: 07/17/2021 11:48       Signed: Buddy Duty, D.O.  Internal Medicine Resident, PGY-1 [facility]  Pager: [phone] 11:24 AM, 07/28/2021

## 2021-07-28 NOTE — Progress Notes (Signed)
It appears patient has developed a radial nerve palsy following ORIF L mid shaft humerus fracture 07/20/21. She has been placed in a cock-up wrist splint while inpatient. Patient stable for d/c to SNF today from ortho standpoint. We will plan to have patient follow-up in 1-2 weeks with Dr. Doreatha Martin in [place] clinic for re-evaluation and we will continue monitor the recovery of the radial nerve. The patient may require a splint be fabricated as an outpatient after assessed at her return ortho visit.   Gwinda Passe PA-C [facility] [phone] (office) [email or web address]

## 2021-07-28 NOTE — Progress Notes (Signed)
Occupational Therapy Treatment Patient Details Name: Mckenzie Guerrero MRN: [id number] DOB: 1951/02/12 Today's Date: 07/28/2021   History of present illness 71 y.o. female s/p fall and admission 1/6 with L humerus fx, s/p ORIF 1/9.[facility] MRI + new areas of acute CVA in L MCA territory. + for COVID.  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   OT comments  Pt seen for splint check this session, no increased skin irritation observed, dorsal webspace wound has not expanded since earlier session this AM. Pt able to tolerate AAROM of digits, wrist, elbow, and shoulder at this session. Noted mild grimacing with elbow and shoulder flexion. Pt able to perform ~50% degrees of shoulder flexion and abduction, 75% elbow flexion, and 25% of composite flexion. Reapplied mepilex bandage and splint. Will reassess in 1 hr for skin irritation. Will follow acutely. Continue to recommend SNF at d/c.   Recommendations for follow up therapy are one component of a multi-disciplinary discharge planning process, led by the attending physician.  Recommendations may be updated based on patient status, additional functional criteria and insurance authorization.    Follow Up Recommendations  Skilled nursing-short term rehab (<3 hours/day)    Assistance Recommended at Discharge Frequent or constant Supervision/Assistance  Patient can return home with the following  A lot of help with bathing/dressing/bathroom;Assistance with cooking/housework;Direct supervision/assist for financial management;Direct supervision/assist for medications management;Assist for transportation;Help with stairs or ramp for entrance;A lot of help with walking and/or transfers   Equipment Recommendations  None recommended by OT;Other (comment) (defer to next venue of care)    Recommendations for Other Services PT consult;Speech consult     Precautions / Restrictions Precautions Precautions: Fall Precaution Comments: Expressive > receptive aphasia, R inattention Required Braces or Orthoses: Sling;Other Brace Other Brace: L wrist splint Restrictions Weight Bearing Restrictions: Yes LUE Weight Bearing: Weight bearing as tolerated Other Position/Activity Restrictions: WBAT for RW only, sling for comfort, cannot lift more than 2lbs. no ROM restrictions       Mobility Bed Mobility Overal bed mobility: Needs Assistance             General bed mobility comments: sitting in recliner upon arrival    Transfers Overall transfer level: Needs assistance Equipment used: 1 person hand held assist Transfers: Sit to/from Stand                   Balance Overall balance assessment: Needs assistance Sitting-balance support: Feet supported, No upper extremity supported Sitting balance-Leahy Scale: Fair Sitting balance - Comments: needs occasional UE support sitting EOB                                   ADL either performed or assessed with clinical judgement   ADL   Eating/Feeding: Set up;Sitting Eating/Feeding Details (indicate cue type and reason): sitting up in chair eating upon arrival                     Toilet Transfer: Minimal assistance;Ambulation;BSC/3in1           Functional mobility during ADLs: Minimal assistance      Extremity/Trunk Assessment Upper Extremity Assessment Upper Extremity Assessment: LUE deficits/detail LUE Deficits / Details: wrist drop noted. pt tolerates PROM of wrist and digits at this time. noted to have wound at dorsum of webspace thumb and index ~  39mm in diameter noted by previous OTs, has not expanded since session this AM. Tegaderm and gel added to webspace strap, as well as milapex bandage still applied. Splint donned, will reassess in 1 hr to assess skin integrity. LUE Coordination: decreased gross motor;decreased fine motor   Lower Extremity  Assessment Lower Extremity Assessment: Defer to PT evaluation        Vision   Vision Assessment?: Vision impaired- to be further tested in functional context   Perception Perception Perception: Not tested   Praxis Praxis Praxis: Not tested    Cognition Arousal/Alertness: Awake/alert Behavior During Therapy: Restless Overall Cognitive Status: Impaired/Different from baseline Area of Impairment: Attention, Safety/judgement, Problem solving                   Current Attention Level: Selective     Safety/Judgement: Decreased awareness of safety, Decreased awareness of deficits   Problem Solving: Slow processing, Difficulty sequencing          Exercises Exercises: General Upper Extremity General Exercises - Upper Extremity Shoulder Flexion: AAROM, Left, 5 reps Shoulder Extension: AAROM, Left, 5 reps Shoulder ABduction: AROM, Left, 15 reps, Seated Shoulder ADduction: AROM, Left, 10 reps, Seated Elbow Flexion: AAROM, Left, 10 reps Elbow Extension: AAROM, Left, 10 reps Wrist Flexion: AAROM, 5 reps, Left, Seated Wrist Extension: PROM, 5 reps, Seated, Left Digit Composite Flexion: AAROM, Left, 5 reps, Seated Composite Extension: AAROM, Left, 5 reps, Seated    Shoulder Instructions       General Comments LUE elevated and supported with pillows/foam at end of session, splint donned    Pertinent Vitals/ Pain       Pain Assessment Pain Assessment: Faces Pain Score: 2  Faces Pain Scale: Hurts a little bit Pain Location: L UE Pain Descriptors / Indicators: Grimacing, Guarding, Moaning Pain Intervention(s): Limited activity within patient's tolerance, Monitored during session  Home Living                                          Prior Functioning/Environment              Frequency  Min 2X/week        Progress Toward Goals  OT Goals(current goals can now be found in the care plan section)  Progress towards OT goals: Progressing  toward goals  Acute Rehab OT Goals Patient Stated Goal: none stated OT Goal Formulation: Patient unable to participate in goal setting Time For Goal Achievement: 08/04/21 Potential to Achieve Goals: Good ADL Goals Pt Will Perform Upper Body Bathing: with min assist Pt Will Perform Lower Body Bathing: with min assist Pt Will Perform Upper Body Dressing: with min guard assist;sitting Pt Will Transfer to Toilet: with min assist;bedside commode;stand pivot transfer Pt Will Perform Toileting - Clothing Manipulation and hygiene: with mod assist;sitting/lateral leans  Plan Discharge plan remains appropriate    Co-evaluation                 AM-PAC OT "6 Clicks" Daily Activity     Outcome Measure   Help from another person eating meals?: A Little Help from another person taking care of personal grooming?: A Little Help from another person toileting, which includes using toliet, bedpan, or urinal?: A Lot Help from another person bathing (including washing, rinsing, drying)?: A Lot Help from another person to put on and taking off regular upper body clothing?: A Lot Help  from another person to put on and taking off regular lower body clothing?: A Lot 6 Click Score: 14    End of Session Equipment Utilized During Treatment: Other (comment) (splint and sling)  OT Visit Diagnosis: Unsteadiness on feet (R26.81);Muscle weakness (generalized) (M62.81);History of falling (Z91.81);Apraxia (R48.2);Other symptoms and signs involving cognitive function;Other symptoms and signs involving the nervous system (R29.898);Cognitive communication deficit (R41.841);Pain Pain - Right/Left: Left Pain - part of body: Hand;Arm   Activity Tolerance Patient tolerated treatment well   Patient Left in bed;with bed alarm set;with call bell/phone within reach   Nurse Communication Mobility status;Precautions;Other (comment) (RN notified to check bandage on upper arm, noted discoloration)        Time:  6269-4854 OT Time Calculation (min): 14 min  Charges: OT General Charges $OT Visit: 1 Visit OT Treatments $Therapeutic Activity: 8-22 mins $Orthotics Fit/Training: 8-22 mins  Lynnda Child, OTD, OTR/L Acute Rehab [phone]) [phone] - [place] 07/28/2021, 11:26 AM

## 2021-07-28 NOTE — TOC Transition Note (Signed)
Transition of Care [facility]) - CM/SW Discharge Note   Patient Details  Name: LAQUANNA VEAZEY MRN: [id number] Date of Birth: 1950/08/17  Transition of [facility]) CM/SW Contact:  Joanne Chars, LCSW Phone Number: 07/28/2021, 10:56 AM   Clinical Narrative:   Pt discharging to [facility], room 306.  RN call [phone] for report and ask for 300 hall.      Final next level of care: Skilled Nursing Facility Barriers to Discharge: Barriers Resolved   Patient Goals and CMS Choice        Discharge Placement              Patient chooses bed at:  ([facility]) Patient to be transferred to facility by: [place] Name of family member notified: son [name] Patient and family notified of of transfer: 07/28/21  Discharge Plan and Services In-house Referral: Clinical Social Work                                   Social Determinants of Health ([place]) Interventions     Readmission Risk Interventions No flowsheet data found.

## 2021-08-06 ENCOUNTER — Encounter: Payer: PPO | Admitting: Physical Medicine and Rehabilitation

## 2021-08-31 ENCOUNTER — Inpatient Hospital Stay: Payer: PPO | Admitting: Adult Health

## 2021-10-01 ENCOUNTER — Ambulatory Visit (INDEPENDENT_AMBULATORY_CARE_PROVIDER_SITE_OTHER): Payer: PPO | Admitting: Adult Health

## 2021-10-01 ENCOUNTER — Encounter: Payer: Self-pay | Admitting: Adult Health

## 2021-10-01 ENCOUNTER — Other Ambulatory Visit: Payer: Self-pay

## 2021-10-01 VITALS — BP 124/68 | HR 80 | Ht 62.0 in | Wt 119.0 lb

## 2021-10-01 DIAGNOSIS — R4701 Aphasia: Secondary | ICD-10-CM | POA: Diagnosis not present

## 2021-10-01 DIAGNOSIS — I63412 Cerebral infarction due to embolism of left middle cerebral artery: Secondary | ICD-10-CM | POA: Diagnosis not present

## 2021-10-01 DIAGNOSIS — Z09 Encounter for follow-up examination after completed treatment for conditions other than malignant neoplasm: Secondary | ICD-10-CM

## 2021-10-01 NOTE — Patient Instructions (Addendum)
Please contact ortho to schedule a hospital follow up visit ?[facility] ?[address] ?[place], [place] [id number] ?[phone] ? ?Continue working with therapies for hopeful ongoing recovery ? ?Continue Eliquis  and atorvastatin for secondary stroke prevention measures  ? ? ? ?Follow up in 4 months or call earlier if needed  ? ? ? ? ?Stroke Prevention ?Some medical conditions and lifestyle choices can lead to a higher risk for a stroke. You can help to prevent a stroke by eating healthy foods and exercising. It also helps to not smoke and to manage any health problems you may have. ?How can this condition affect me? ?A stroke is an emergency. It should be treated right away. A stroke can lead to brain damage or threaten your life. There is a better chance of surviving and getting better after a stroke if you get medical help right away. ?What can increase my risk? ?The following medical conditions may increase your risk of a stroke: ?Diseases of the heart and blood vessels (cardiovascular disease). ?High blood pressure (hypertension). ?Diabetes. ?High cholesterol. ?Sickle cell disease. ?Problems with blood clotting. ?Being very overweight. ?Sleeping problems (obstructivesleep apnea). ?Other risk factors include: ?Being older than age 20. ?A history of blood clots, stroke, or mini-stroke (TIA). ?Race, ethnic background, or a family history of stroke. ?Smoking or using tobacco products. ?Taking birth control pills, especially if you smoke. ?Heavy alcohol and drug use. ?Not being active. ?What actions can I take to prevent this? ?Manage your health conditions ?High cholesterol. ?Eat a healthy diet. If this is not enough to manage your cholesterol, you may need to take medicines. ?Take medicines as told by your doctor. ?High blood pressure. ?Try to keep your blood pressure below 130/80. ?If your blood pressure cannot be managed through a healthy diet and regular exercise, you may need to take  medicines. ?Take medicines as told by your doctor. ?Ask your doctor if you should check your blood pressure at home. ?Have your blood pressure checked every year. ?Diabetes. ?Eat a healthy diet and get regular exercise. If your blood sugar (glucose) cannot be managed through diet and exercise, you may need to take medicines. ?Take medicines as told by your doctor. ?Talk to your doctor about getting checked for sleeping problems. Signs of a problem can include: ?Snoring a lot. ?Feeling very tired. ?Make sure that you manage any other conditions you have. ?Nutrition ? ?Follow instructions from your doctor about what to eat or drink. You may be told to: ?Eat and drink fewer calories each day. ?Limit how much salt (sodium) you use to 1,500 milligrams (mg) each day. ?Use only healthy fats for cooking, such as olive oil, canola oil, and sunflower oil. ?Eat healthy foods. To do this: ?Choose foods that are high in fiber. These include whole grains, and fresh fruits and vegetables. ?Eat at least 5 servings of fruits and vegetables a day. Try to fill one-half of your plate with fruits and vegetables at each meal. ?Choose low-fat (lean) proteins. These include low-fat cuts of meat, chicken without skin, fish, tofu, beans, and nuts. ?Eat low-fat dairy products. ?Avoid foods that: ?Are high in salt. ?Have saturated fat. ?Have trans fat. ?Have cholesterol. ?Are processed or pre-made. ?Count how many carbohydrates you eat and drink each day. ?Lifestyle ?If you drink alcohol: ?Limit how much you have to: ?0-1 drink a day for women who are not pregnant. ?0-2 drinks a day for men. ?Know how much alcohol is in your drink. In the [place]  one drink equals one 12 oz bottle of beer (348m), one 5 oz glass of wine (1416m, or one 1? oz glass of hard liquor (4447m ?Do not smoke or use any products that have nicotine or tobacco. If you need help quitting, ask your doctor. ?Avoid secondhand smoke. ?Do not use drugs. ?Activity ? ?Try to stay  at a healthy weight. ?Get at least 30 minutes of exercise on most days, such as: ?Fast walking. ?Biking. ?Swimming. ?Medicines ?Take over-the-counter and prescription medicines only as told by your doctor. ?Avoid taking birth control pills. Talk to your doctor about the risks of taking birth control pills if: ?You are over 35 28ars old. ?You smoke. ?You get very bad headaches. ?You have had a blood clot. ?Where to find more information ?American Stroke Association: www.strokeassociation.org ?Get help right away if: ?You or a loved one has any signs of a stroke. "BE FAST" is an easy way to remember the warning signs: ?B - Balance. Dizziness, sudden trouble walking, or loss of balance. ?E - Eyes. Trouble seeing or a change in how you see. ?F - Face. Sudden weakness or loss of feeling of the face. The face or eyelid may droop on one side. ?A - Arms. Weakness or loss of feeling in an arm. This happens all of a sudden and most often on one side of the body. ?S - Speech. Sudden trouble speaking, slurred speech, or trouble understanding what people say. ?T - Time. Time to call emergency services. Write down what time symptoms started. ?You or a loved one has other signs of a stroke, such as: ?A sudden, very bad headache with no known cause. ?Feeling like you may vomit (nausea). ?Vomiting. ?A seizure. ?These symptoms may be an emergency. Get help right away. Call your local emergency services (911 in the [place] ?Do not wait to see if the symptoms will go away. ?Do not drive yourself to the hospital. ?Summary ?You can help to prevent a stroke by eating healthy, exercising, and not smoking. It also helps to manage any health problems you have. ?Do not smoke or use any products that contain nicotine or tobacco. ?Get help right away if you or a loved one has any signs of a stroke. ?This information is not intended to replace advice given to you by your health care provider. Make sure you discuss any questions you have with your  health care provider. ?Document Revised: 01/28/2020 Document Reviewed: 01/28/2020 ?Elsevier Patient Education ? 2022 [place] ? ? ? ? ? ? ?

## 2021-10-01 NOTE — Progress Notes (Signed)
?[facility] ?[address] ?[name]. [place] [address] ?[phone] ? ?     HOSPITAL FOLLOW UP NOTE ? ?[name] ?Date of Birth:  December 21, 1950 ?Medical Record Number:  [id number]  ? ?Reason for Referral:  hospital stroke follow up ? ? ? ?SUBJECTIVE: ? ? ?CHIEF COMPLAINT:  ?Chief Complaint  ?Patient presents with  ? Follow-up  ?  RM 2 with son Mckenzie Guerrero   ?Pt is well, having some issues with speech but overall stable.   ? ? ?HPI:  ? ?[name] is a 71 y.o. female with history of migraine headaches, anxiety, gleeding ulcers, HTN and tremor, presented to the ED via EMS as a Code Stroke on 06/08/2021 after family found her in her recliner at home with aphasia, right hemiplegia, right facial droop and left gaze preference.  Personally reviewed hospitalization pertinent progress notes, lab work and imaging.  Evaluated by Dr. Erlinda Hong for large left MCA infarct with hemorrhagic conversion secondary to newly diagnosed A-fib.  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  Repeat CT head showed slightly increased in mass effect with 3 mm midline shift and repeat CT head stable.  EF 60 to 65%.  LE Doppler negative.  LDL 139.  A1c 5.8.  Telemetry and EKG confirmed A-fib placed on aspirin in setting of hemorrhage and recommended initiating DOAC 10-14 days post stroke if stable.  Added atorvastatin 40 mg daily.  Other stroke risk factors include advanced age and migraines with no prior stroke history.  Residual deficits of global aphasia, nonverbal, left gaze preference, no blinking to visual threat on the right, right facial droop, and right-sided weakness.  She was discharged to [facility] on 12/3 per therapy recommendations. During rehab stay, aspirin discontinued and can switch to Eliquis 5 mg twice daily.  She was discharged home on 12/20. ? ?Return to ED on 07/17/2021 for dizziness with staggering gait with fall sustaining left humerus fracture s/p ORIF.  Noted to be  positive for COVID.  MRI showed prior left MCA territory infarct with significant laminar necrosis, further reviewed by neurology and neuroradiologist who did not feel new ischemic stroke.  MRA showed proximal right VA likely occlusion otherwise normal. LDL 57.  A1c 5.0.  Eliquis placed on hold for ORIF for left humerus fracture and restarted prior to discharge. Noted radial nerve palsy  following ORIF and advised f/u with ortho 2 weeks post d/c. She was discharged to SNF on 1/17 ? ? ?Today, 10/01/2021, patient being seen for initial hospital follow-up accompanied by her son, Mckenzie Guerrero who provides majority of history. She is still [facility] SN[facility].  Overall stable without new stroke/TIA symptoms.  She is working with therapies. Main issue is speech difficulty - more so difficulty saying correct words - son believes she understands what is being said to her. Able to say yes/no. At times can say words clearly but when trying to say longer sentences, words get jumbled up. Physically recovered well - son does not believe any additional weakness.  Ambulates without assistive device and denies any recent falls.  She has not been seen by ortho since discharge.  Compliant on Eliquis and atorvastatin, denies side effects.  Blood pressure today initially 156/87 and on recheck 124/68.  No further concerns at this time. ? ? ? ?PERTINENT IMAGING ? ? ?Per hospitalization 06/08/2021 ?CT head : acute to early subacute left MCA territorial infarct without hemorrhagic transformation. Aspects 4.  ?  CTA head & neck  - No intracranial large vessel occlusion. 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 ?CT perfusion  - 42 mL left MCA territory core infarct with 14 mL surrounding ischemic penumbra. ?MRI Evolving left MCA territorial infarct with evidence of hemorrhagic transformation and trace rightward midline shift. ?CT repeat large MCA infarct with petechial hemorrhage, no signal change slightly  increased intracranial mass-effect, 3 mm midline shift ? repeat head CT 06/12/21 Stable ?2D Echo EF 60 to 65% ?LE venous Doppler no DVT ?LDL 139 ?HgbA1c 5.8 ? ? ?Per hospitalization 07/17/2021 ?CT head - Chronic changes with no acute intracranial process identified.  ?MRI head - Status post prior left MCA territory infarct, with significant laminar necrosis and prolonged DWI changes, discussed with Dr. Nevada Crane neuroradiologist, do not feel new ischemic stroke at this time. ?MRA head - No flow related enhancement within the proximal right vertebral artery, which may be occluded. Otherwise normal intracranial MRA. ?2D Echo - 06/09/2021 - EF 60 - 65%. No cardiac source of emboli identified.  ?Lacey Jensen Virus 2 - positive ?LDL - 57 ?HgbA1c - 5.0 ? ? ? ?ROS:   ?14 system review of systems performed and negative with exception of those listed in HPI ? ?PMH:  ?Past Medical History:  ?Diagnosis Date  ? Anxiety   ? Arthritis   ? osteoarthritis  ? Complication of anesthesia   ? GERD (gastroesophageal reflux disease)   ? Headache   ? migraines  ? History of bleeding ulcers 2010  ? History of hiatal hernia   ? Hypertension   ? Neuromuscular disorder ([place])   ? tremors  ? Occasional tremors   ? PONV (postoperative nausea and vomiting)   ? ? ?PSH:  ?Past Surgical History:  ?Procedure Laterality Date  ? ANTERIOR CERVICAL DECOMP/DISCECTOMY FUSION N/A 09/28/2019  ? Procedure: Anterior Cervical Discectomy Fusion - Cervical Four-Cervical Five- Cervical Five-Cervical Six;  Surgeon: Earnie Larsson, MD;  Location: [facility];  Service: Neurosurgery;  Laterality: N/A;  Anterior Cervical Discectomy Fusion - Cervical Four-Cervical Five- Cervical Five-Cervical Six  ? benign tumor removal from left breast 2003  2003  ? BUNIONECTOMY Left   ? Great toe  ? COLONOSCOPY    ? ESOPHAGOGASTRODUODENOSCOPY N/A 05/25/2021  ? Procedure: ESOPHAGOGASTRODUODENOSCOPY (EGD);  Surgeon: Johnathan Hausen, MD;  Location: [facility];  Service: General;  Laterality: N/A;  ?  ESOPHAGOGASTRODUODENOSCOPY  2022  ? w biopsy.  Dr Odie Sera in [place].  prior to [facility] surgery 05/2021.  ? ORIF HUMERUS FRACTURE Left 07/20/2021  ? Procedure: OPEN REDUCTION INTERNAL FIXATION (ORIF) DISTAL HUMERUS FRACTURE;  Surgeon: Shona Needles, MD;  Location: [facility];  Service: Orthopedics;  Laterality: Left;  ? XI ROBOTIC ASSISTED HIATAL HERNIA REPAIR N/A 05/25/2021  ? Procedure: XI ROBOTIC ASSISTED HIATAL HERNIA REPAIR WITH FUNDOPLICATION;  Surgeon: Johnathan Hausen, MD;  Location: [facility];  Service: General;  Laterality: N/A;  ? ? ?Social History:  ?Social History  ? ?Socioeconomic History  ? Marital status: Divorced  ?  Spouse name: Not on file  ? Number of children: Not on file  ? Years of education: Not on file  ? Highest education level: Not on file  ?Occupational History  ? Not on file  ?Tobacco Use  ? Smoking status: Former  ? Smokeless tobacco: Never  ?Vaping Use  ? Vaping Use: Never used  ?Substance and Sexual Activity  ? Alcohol use: Never  ? Drug use: Never  ? Sexual activity: Not on file  ?  Other Topics Concern  ? Not on file  ?Social History Narrative  ? Not on file  ? ?Social Determinants of Health  ? ?Financial Resource Strain: Not on file  ?Food Insecurity: Not on file  ?Transportation Needs: Not on file  ?Physical Activity: Not on file  ?Stress: Not on file  ?Social Connections: Not on file  ?Intimate Partner Violence: Not on file  ? ? ?Family History: History reviewed. No pertinent family history. ? ?Medications:   ?Current Outpatient Medications on File Prior to Visit  ?Medication Sig Dispense Refill  ? acetaminophen (TYLENOL) 325 MG tablet Take 2 tablets (650 mg total) by mouth 3 (three) times daily. 300 tablet 0  ? apixaban (ELIQUIS) 5 MG TABS tablet Take 1 tablet (5 mg total) by mouth 2 (two) times daily. 60 tablet 0  ? atorvastatin (LIPITOR) 40 MG tablet Take 1 tablet (40 mg total) by mouth daily. 30 tablet 0  ? B Complex-C (B-COMPLEX WITH VITAMIN C) tablet Take 1 tablet by mouth daily. 30  tablet 0  ? lidocaine (LIDODERM) 5 % Place 1 patch onto the skin daily. To lower back--on at 8 am and remove at 8 pm daily. Has to be off for 12 hours 30 patch 0  ? metoprolol succinate (TOPROL-XL) 25 MG 24 hr tabl

## 2021-10-01 NOTE — Progress Notes (Signed)
I agree with the above plan 

## 2022-02-03 NOTE — Progress Notes (Unsigned)
[facility] [address] [place]. [place] [address] [phone]       STROKE FOLLOW UP NOTE  Mckenzie Guerrero Date of Birth:  09-Jul-1951 Medical Record Number:  [id number]   Reason for Referral: stroke follow up    SUBJECTIVE:   CHIEF COMPLAINT:  No chief complaint on file.   HPI:   Update 02/04/2022 JM: Patient returns for 64-monthstroke follow-up.  Continues to reside at ***.  Overall stable without new stroke/TIA symptoms.  Reports continued aphasia ***.   Compliant on Eliquis and atorvastatin, denies side effects Blood pressure today ***        History provided for reference purposes only Initial visit 10/01/2021 JM:  Patient being seen for initial hospital follow-up accompanied by her son, [name] provides majority of history. [facility]  Overall stable without new stroke/TIA symptoms.  She is working with therapies. Main issue is speech difficulty - more so difficulty saying correct words - son believes she understands what is being said to her. Able to say yes/no. At times can say words clearly but when trying to say longer sentences, words get jumbled up. Physically recovered well - son does not believe any additional weakness.  Ambulates without assistive device and denies any recent falls.  She has not been seen by ortho since discharge.  Compliant on Eliquis and atorvastatin, denies side effects.  Blood pressure today initially 156/87 and on recheck 124/68.  No further concerns at this time.  Stroke admission 06/08/2021 Ms. Mckenzie PULLMANis a 71y.o. female with history of migraine headaches, anxiety, gleeding ulcers, HTN and tremor, presented to the ED via EMS as a Code Stroke on 06/08/2021 after family found her in her recliner at home with aphasia, right hemiplegia, right facial droop and left gaze preference.  Personally reviewed hospitalization pertinent progress notes, lab work and imaging.  Evaluated by Dr.  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  Repeat CT head showed slightly increased in mass effect with 3 mm midline shift and repeat CT head stable.  EF 60 to 65%.  LE Doppler negative.  LDL 139.  A1c 5.8.  Telemetry and EKG confirmed A-fib placed on aspirin in setting of hemorrhage and recommended initiating DOAC 10-14 days post stroke if stable.  Added atorvastatin 40 mg daily.  Other stroke risk factors include advanced age and migraines with no prior stroke history.  Residual deficits of global aphasia, nonverbal, left gaze preference, no blinking to visual threat on the right, right facial droop, and right-sided weakness.  She was discharged to CIR on 12/3 per therapy recommendations. During rehab stay, aspirin discontinued and can switch to Eliquis 5 mg twice daily.  She was discharged home on 12/20.  Return to ED on 07/17/2021 for dizziness with staggering gait with fall sustaining left humerus fracture s/p ORIF.  Noted to be positive for COVID.  MRI showed prior left MCA territory infarct with significant laminar necrosis, further reviewed by neurology and neuroradiologist who did not feel new ischemic stroke.  MRA showed proximal right VA likely occlusion otherwise normal. LDL 57.  A1c 5.0.  Eliquis placed on hold for ORIF for left humerus fracture and restarted prior to discharge. Noted radial nerve palsy  following ORIF and advised f/u with ortho 2 weeks post d/c. She was discharged to  SNF on 1/17       PERTINENT IMAGING   Per hospitalization 06/08/2021 CT head : acute to early subacute left MCA territorial infarct without hemorrhagic transformation. Aspects 4.  CTA head & neck  - No intracranial large vessel occlusion. 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 CT perfusion  - 42 mL left MCA territory core infarct with 14  mL surrounding ischemic penumbra. MRI Evolving left MCA territorial infarct with evidence of hemorrhagic transformation and trace rightward midline shift. CT repeat large MCA infarct with petechial hemorrhage, no signal change slightly increased intracranial mass-effect, 3 mm midline shift  repeat head CT 06/12/21 Stable 2D Echo EF 60 to 65% LE venous Doppler no DVT LDL 139 HgbA1c 5.8   Per hospitalization 07/17/2021 CT head - Chronic changes with no acute intracranial process identified.  MRI head - Status post prior left MCA territory infarct, with significant laminar necrosis and prolonged DWI changes, discussed with Dr. Nevada Crane neuroradiologist, do not feel new ischemic stroke at this time. MRA head - No flow related enhancement within the proximal right vertebral artery, which may be occluded. Otherwise normal intracranial MRA. 2D Echo - 06/09/2021 - EF 60 - 65%. No cardiac source of emboli identified.  Sars Corona Virus 2 - positive LDL - 57 HgbA1c - 5.0    ROS:   14 system review of systems performed and negative with exception of those listed in HPI  PMH:  Past Medical History:  Diagnosis Date   Anxiety    Arthritis    osteoarthritis   Complication of anesthesia    GERD (gastroesophageal reflux disease)    Headache    migraines   History of bleeding ulcers 2010   History of hiatal hernia    Hypertension    Neuromuscular disorder (HCC)    tremors   Occasional tremors    PONV (postoperative nausea and vomiting)     PSH:  Past Surgical History:  Procedure Laterality Date   ANTERIOR CERVICAL DECOMP/DISCECTOMY FUSION N/A 09/28/2019   Procedure: Anterior Cervical Discectomy Fusion - Cervical Four-Cervical Five- Cervical Five-Cervical Six;  Surgeon: Earnie Larsson, MD;  Location: [facility];  Service: Neurosurgery;  Laterality: N/A;  Anterior Cervical Discectomy Fusion - Cervical Four-Cervical Five- Cervical Five-Cervical Six   benign tumor removal from left breast 2003  2003    BUNIONECTOMY Left    Great toe   COLONOSCOPY     ESOPHAGOGASTRODUODENOSCOPY N/A 05/25/2021   Procedure: ESOPHAGOGASTRODUODENOSCOPY (EGD);  Surgeon: Johnathan Hausen, MD;  Location: [facility];  Service: General;  Laterality: N/A;   ESOPHAGOGASTRODUODENOSCOPY  2022   w biopsy.  Dr Odie Sera in [place].  prior to [facility] surgery 05/2021.   ORIF HUMERUS FRACTURE Left 07/20/2021   Procedure: OPEN REDUCTION INTERNAL FIXATION (ORIF) DISTAL HUMERUS FRACTURE;  Surgeon: Shona Needles, MD;  Location: [facility];  Service: Orthopedics;  Laterality: Left;   XI ROBOTIC ASSISTED HIATAL HERNIA REPAIR N/A 05/25/2021   Procedure: XI ROBOTIC ASSISTED HIATAL HERNIA REPAIR WITH FUNDOPLICATION;  Surgeon: Johnathan Hausen, MD;  Location: [facility];  Service: General;  Laterality: N/A;    Social History:  Social History   Socioeconomic History   Marital status: Divorced    Spouse name: Not on file   Number of children: Not on file   Years of education: Not on file   Highest education level: Not on file  Occupational History   Not on file  Tobacco Use   Smoking status: Former   Smokeless  tobacco: Never  Vaping Use   Vaping Use: Never used  Substance and Sexual Activity   Alcohol use: Never   Drug use: Never   Sexual activity: Not on file  Other Topics Concern   Not on file  Social History Narrative   Not on file   Social Determinants of Health   Financial Resource Strain: Not on file  Food Insecurity: Not on file  Transportation Needs: Not on file  Physical Activity: Not on file  Stress: Not on file  Social Connections: Not on file  Intimate Partner Violence: Not on file    Family History: No family history on file.  Medications:   Current Outpatient Medications on File Prior to Visit  Medication Sig Dispense Refill   acetaminophen (TYLENOL) 325 MG tablet Take 2 tablets (650 mg total) by mouth 3 (three) times daily. 300 tablet 0   apixaban (ELIQUIS) 5 MG TABS tablet Take 1 tablet (5 mg total) by  mouth 2 (two) times daily. 60 tablet 0   atorvastatin (LIPITOR) 40 MG tablet Take 1 tablet (40 mg total) by mouth daily. 30 tablet 0   B Complex-C (B-COMPLEX WITH VITAMIN C) tablet Take 1 tablet by mouth daily. 30 tablet 0   HYDROcodone-acetaminophen (NORCO/VICODIN) 5-325 MG tablet Take 1-2 tablets by mouth every 6 (six) hours as needed for moderate pain or severe pain (Use 1 tablet for pain score 4-6, use 2 tablets for pain score 7-10). (Patient not taking: Reported on 10/01/2021) 30 tablet 0   lidocaine (LIDODERM) 5 % Place 1 patch onto the skin daily. To lower back--on at 8 am and remove at 8 pm daily. Has to be off for 12 hours 30 patch 0   megestrol (MEGACE) 40 MG/ML suspension Take 10 mLs (400 mg total) by mouth daily. (Patient not taking: Reported on 10/01/2021) 300 mL 1   metoprolol succinate (TOPROL-XL) 25 MG 24 hr tablet Take 1/2 tablet (12.5 mg total) by mouth daily. 30 tablet 0   Nystatin (GERHARDT'S BUTT CREAM) CREA Apply 1 application topically 3 (three) times daily. (Patient not taking: Reported on 10/01/2021) 1 each 0   No current facility-administered medications on file prior to visit.    Allergies:   Allergies  Allergen Reactions   Aspirin Other (See Comments)    unknown   Codeine Hives and Itching   Doxycycline Nausea And Vomiting   Nsaids Other (See Comments)    Gi bleeding   Sulfonamide Derivatives Swelling   Prednisone Anxiety      OBJECTIVE:  Physical Exam  There were no vitals filed for this visit.  There is no height or weight on file to calculate BMI. No results found.   General: well developed, well nourished, very pleasant elderly Caucasian female, seated, in no evident distress Head: head normocephalic and atraumatic.   Neck: supple with no carotid or supraclavicular bruits Cardiovascular: regular rate and rhythm, no murmurs Musculoskeletal: no deformity Skin:  no rash/petichiae Vascular:  Normal pulses all extremities   Neurologic Exam Mental  Status: Awake and fully alert.  Severe aphasia and mild to moderate receptive aphasia. Unable to name objects or say name.  Occasionally answers yes/no appropriately.  Intermittently able to follow simple step commands.  Difficulty fully assessing cognition. mood and affect appropriate.  Cranial Nerves: Fundoscopic exam reveals sharp disc margins. Pupils equal, briskly reactive to light. Extraocular movements full without nystagmus.  Blink to threat bilaterally. Hearing intact. Facial sensation intact.  Mild right lower facial weakness.  Tongue, palate  moves normally and symmetrically.  Motor: Normal bulk and tone. Normal strength in all tested extremity muscles although difficulty fully assessing due to difficulty understanding directions but unable to appreciate any weakness on right side. Possible some weakness of LUE but guarded and difficulty understanding directions, obvious weakness of left wrist  Sensory.:  Responded to painful stimuli appropriately; unable to determine if sensation equal Coordination: Rapid alternating movements unable to assess. Finger-to-nose and heel-to-shin unable to assess. Gait and Station: Arises from chair without difficulty. Stance is normal. Gait demonstrates normal stride length and balance with mild imbalance without use of AD. Tandem walk and heel toe not attempted.  Reflexes: 1+ and symmetric. Toes downgoing.     NIHSS  7 Modified Rankin  3-4      ASSESSMENT: Mckenzie Guerrero is a 71 y.o. year old female with large left MCA infarct with hemorrhagic conversion on 06/08/2021 secondary to newly diagnosed A-fib. Vascular risk factors include HTN, HLD, A-fib, R VA occlusion, advanced age, former tobacco use and migraines.  Returned to ED on 07/17/2021 after a fall at home sustaining left humerus fracture s/p ORIF and COVID-positive, MRI showed laminar necrosis in area of prior L MCA infarct but no new stroke.      PLAN:  Large left MCA stroke:  Residual  deficit: Significant expressive> receptive aphasia.  Continue working with therapies at [facility].  Discussed potential further recovery but overall limited due to extent of stroke.  Continue Eliquis (apixaban) daily  and atorvastatin for secondary stroke prevention.   Discussed secondary stroke prevention measures and importance of close PCP/SNF follow up for aggressive stroke risk factor management including BP goal<130/90, and HLD with LDL goal<70 I have gone over the pathophysiology of stroke, warning signs and symptoms, risk factors and their management in some detail with instructions to go to the closest emergency room for symptoms of concern. Atrial fibrillation: Continue Eliquis 5 mg twice daily managed by SNF  Left humerus fracture s/p ORIF: provided office number information to schedule follow-up visit with orthopedics who also plan on evaluation of radial nerve palsy 2/2 fracture     Follow up in 4 months or call earlier if needed   CC:  [facility] provider: Dr. Leonie Man PCP: Raina Mina., MD    I spent 58 minutes of face-to-face and non-face-to-face time with patient and son.  This included previsit chart review including review of recent hospitalization, lab review, study review, electronic health record documentation, patient and son education regarding recent stroke including etiology, secondary stroke prevention measures and importance of managing stroke risk factors, residual deficits and typical recovery time, left fracture and further concerns and answered all other questions to patient and sons satisfaction   Frann Rider, AGNP-BC  [facility] [address] [place],  [address]  Phone [phone] Fax [phone] Note: This document was prepared with digital dictation and possible smart phrase technology. Any transcriptional errors that result from this process are unintentional.

## 2022-02-04 ENCOUNTER — Encounter: Payer: Self-pay | Admitting: Adult Health

## 2022-02-04 ENCOUNTER — Ambulatory Visit (INDEPENDENT_AMBULATORY_CARE_PROVIDER_SITE_OTHER): Payer: PPO | Admitting: Adult Health

## 2022-02-04 VITALS — BP 122/72 | HR 85 | Ht 62.0 in | Wt 119.0 lb

## 2022-02-04 DIAGNOSIS — R4701 Aphasia: Secondary | ICD-10-CM | POA: Diagnosis not present

## 2022-02-04 DIAGNOSIS — I63412 Cerebral infarction due to embolism of left middle cerebral artery: Secondary | ICD-10-CM

## 2022-02-04 NOTE — Patient Instructions (Addendum)
Overall stable from stroke standpoint, recommend follow up as needed  Continue working with speech therapy - no significant changes noted since prior visit  Continue  Eliquis 5 mg twice daily   and atorvastatin for secondary stroke prevention  Continue to follow up with PCP/SNF regarding blood pressure and cholesterol management - request routine lab work at [facility] including cholesterol levels  Maintain strict control of hypertension with blood pressure goal below 130/90 and cholesterol with LDL cholesterol (bad cholesterol) goal below 70 mg/dL.   Signs of a Stroke? Follow the BEFAST method:  Balance Watch for a sudden loss of balance, trouble with coordination or vertigo Eyes Is there a sudden loss of vision in one or both eyes? Or double vision?  Face: Ask the person to smile. Does one side of the face droop or is it numb?  Arms: Ask the person to raise both arms. Does one arm drift downward? Is there weakness or numbness of a leg? Speech: Ask the person to repeat a simple phrase. Does the speech sound slurred/strange? Is the person confused ? Time: If you observe any of these signs, call [phone].        Thank you for coming to see [place] at [facility]. I hope we have been able to provide you high quality care today.  You may receive a patient satisfaction survey over the next few weeks. We would appreciate your feedback and comments so that we may continue to improve ourselves and the health of our patients.

## 2022-11-14 IMAGING — CT CT HEAD W/O CM
3 of 4 series · 15 of 47 positions shown, 18 images · non-contrast
Comparison: CT head 06/10/2021.  MRI 06/09/2021

CLINICAL DATA: Stroke follow-up

EXAM:
CT HEAD WITHOUT CONTRAST
TECHNIQUE: Contiguous axial images were obtained from the base of the skull
through the vertex without intravenous contrast.

[Series 4: head 2.0 h70h · axial · 0.43mm/px · z∈[-122,+8]mm · 9 of 83 slices shown, 12 images]
[im 9/83  brain]
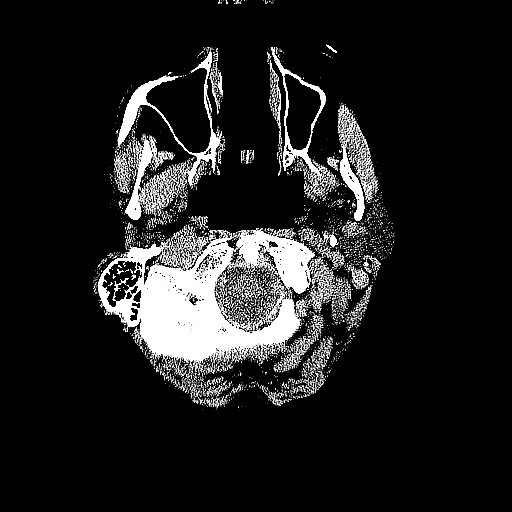
[im 9/83  bone]
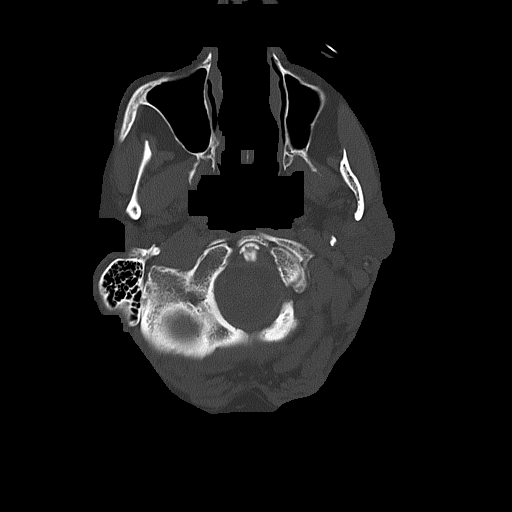
[im 17/83  brain]
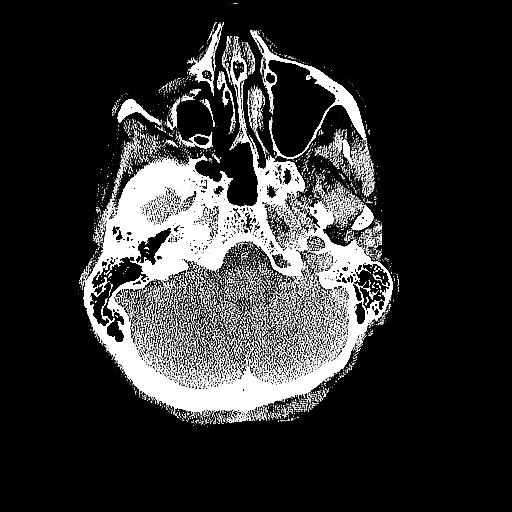
[im 25/83  brain]
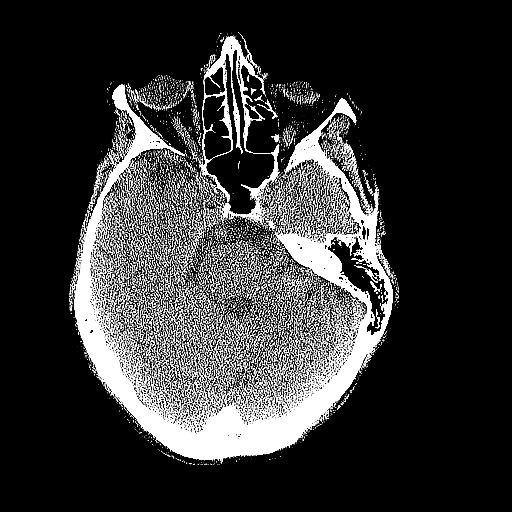
[im 33/83  brain]
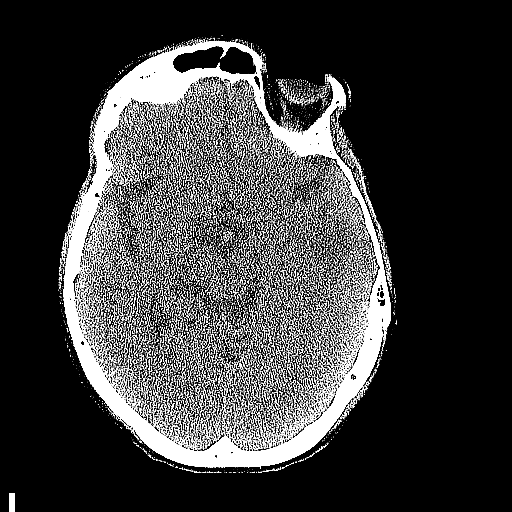
[im 42/83  brain]
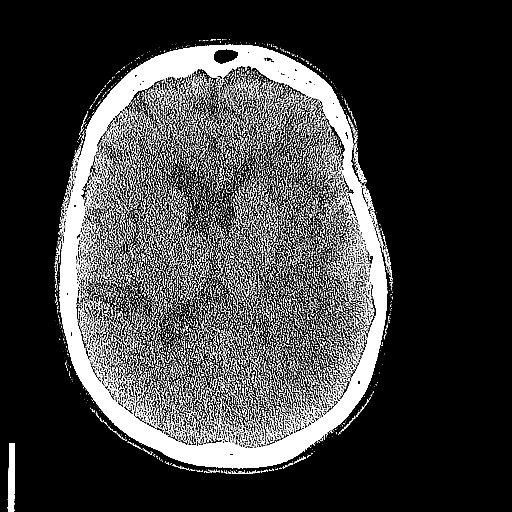
[im 42/83  bone]
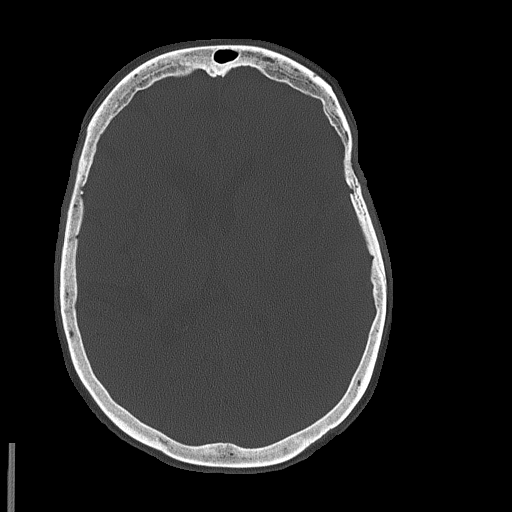
[im 50/83  brain]
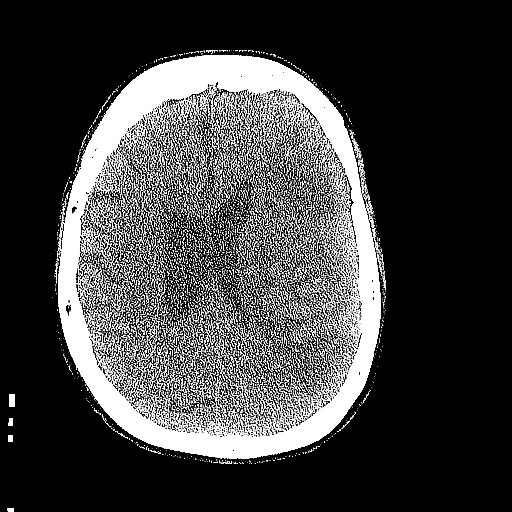
[im 58/83  brain]
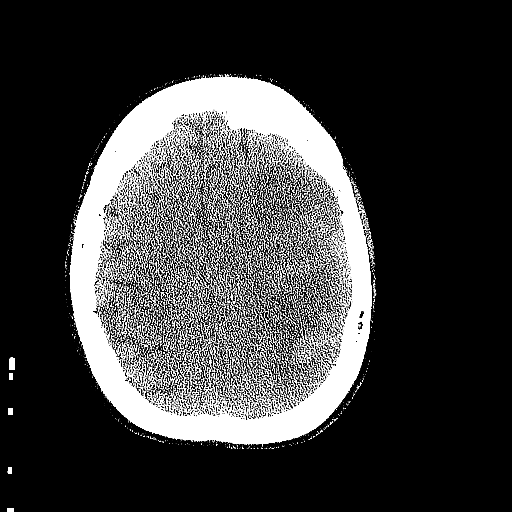
[im 66/83  brain]
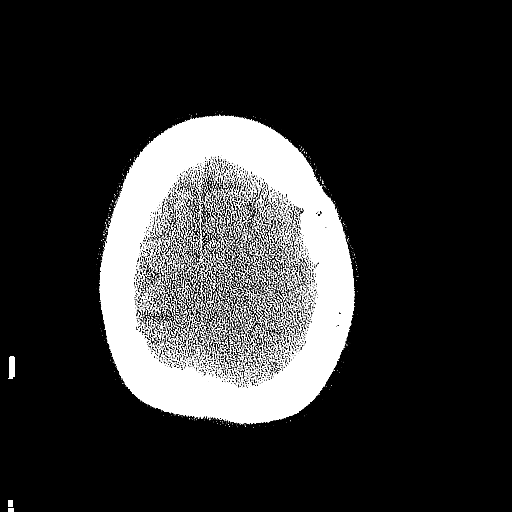
[im 74/83  brain]
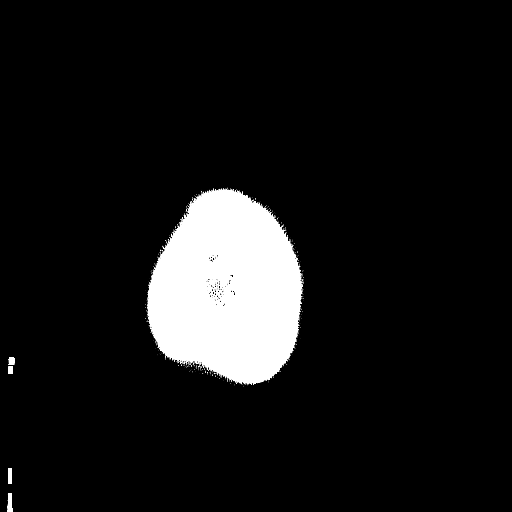
[im 74/83  bone]
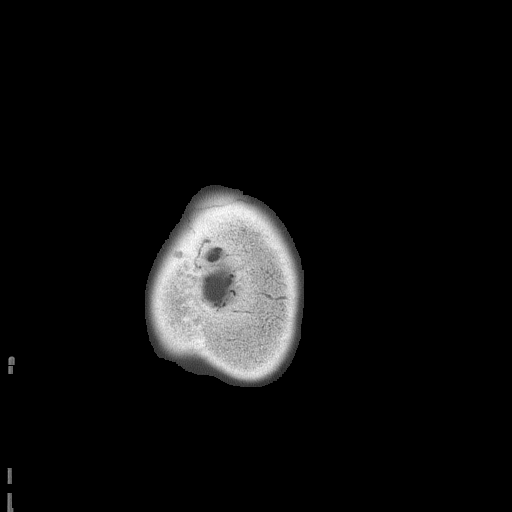

[Series 5: head 3.0 mpr cor · coronal · 0.32mm/px · 3 of 67 slices shown]
[im 23/67  brain]
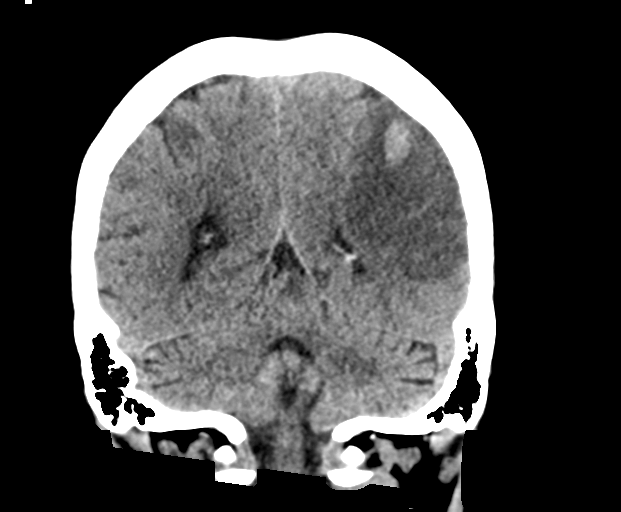
[im 30/67  brain]
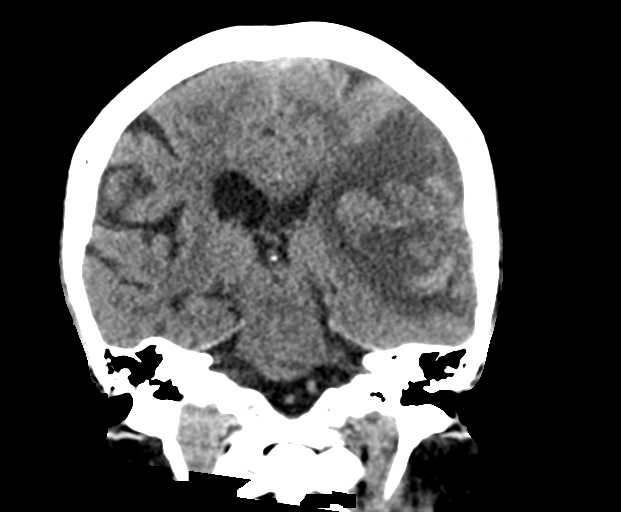
[im 37/67  brain]
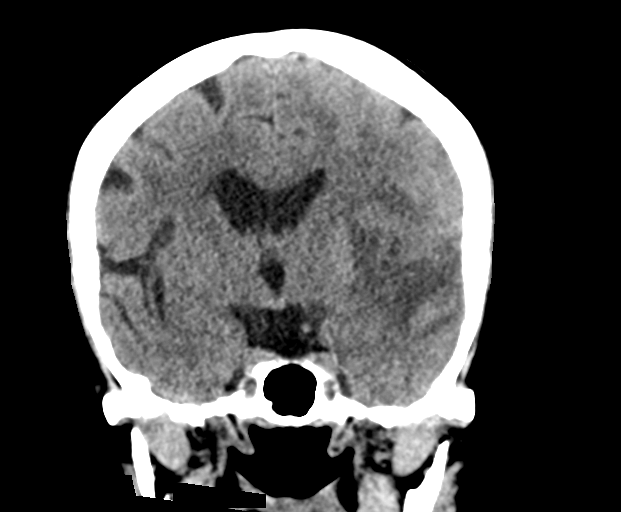

[Series 6: head 3.0 mpr sag · sagittal · 0.32mm/px · 3 of 65 slices shown]
[im 28/65  brain]
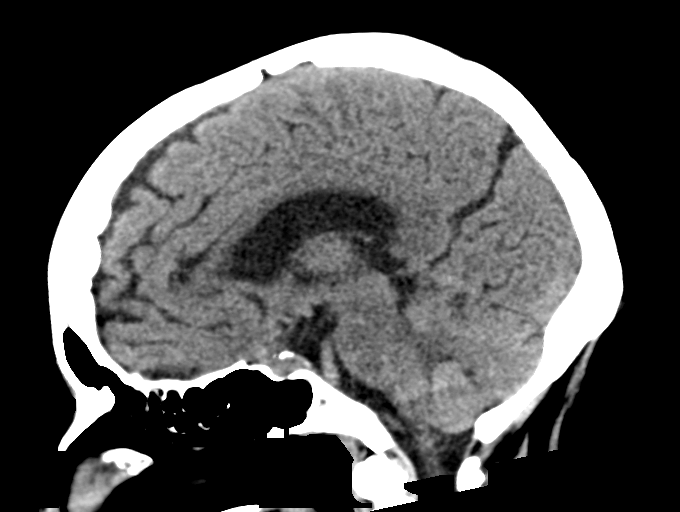
[im 34/65  brain]
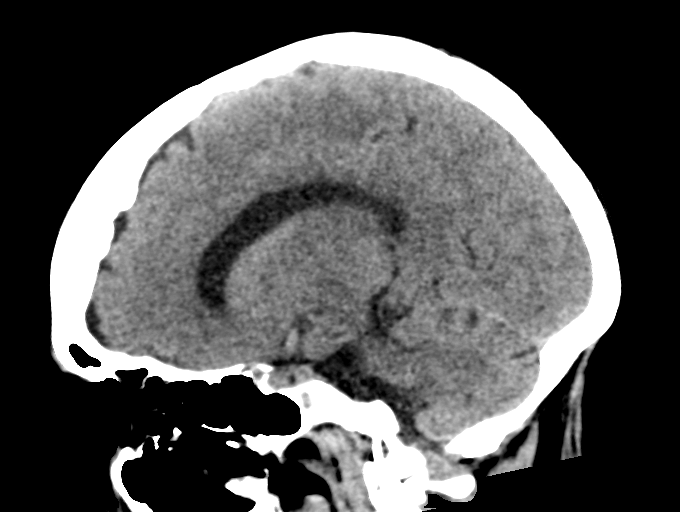
[im 39/65  brain]
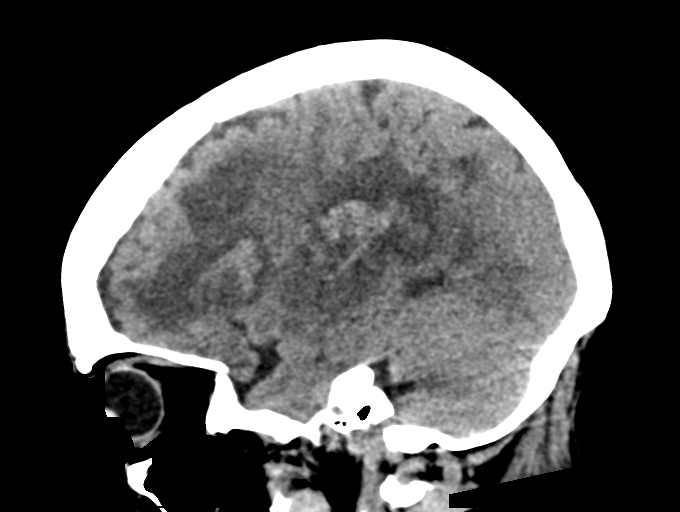

[15 of 47 positions shown; findings below may reference images not displayed]

FINDINGS: Brain: Large territory acute infarct left MCA territory with
hemorrhagic transformation. No significant change in infarct
distribution or hemorrhage since the prior CT. Mass-effect and 3 mm
midline shift to the right unchanged

Ventricle size normal. Mild atrophy and mild chronic microvascular
ischemia.

Vascular: Negative for hyperdense vessel

Skull: Negative

Sinuses/Orbits: Negative

Other: None
IMPRESSION: CT stable from 2 days prior.

Acute large territory left MCA infarct with hemorrhagic
transformation. 3 mm midline shift to the right unchanged.

## 2022-11-19 DIAGNOSIS — Z8673 Personal history of transient ischemic attack (TIA), and cerebral infarction without residual deficits: Secondary | ICD-10-CM

## 2022-11-19 DIAGNOSIS — R1310 Dysphagia, unspecified: Secondary | ICD-10-CM

## 2022-11-19 DIAGNOSIS — E785 Hyperlipidemia, unspecified: Secondary | ICD-10-CM | POA: Diagnosis not present

## 2022-11-19 DIAGNOSIS — I69351 Hemiplegia and hemiparesis following cerebral infarction affecting right dominant side: Secondary | ICD-10-CM | POA: Diagnosis not present

## 2022-11-19 DIAGNOSIS — G894 Chronic pain syndrome: Secondary | ICD-10-CM

## 2022-11-19 DIAGNOSIS — Z681 Body mass index (BMI) 19 or less, adult: Secondary | ICD-10-CM

## 2022-11-19 DIAGNOSIS — I69320 Aphasia following cerebral infarction: Secondary | ICD-10-CM | POA: Diagnosis not present

## 2022-11-19 DIAGNOSIS — I119 Hypertensive heart disease without heart failure: Secondary | ICD-10-CM | POA: Diagnosis not present

## 2022-11-19 IMAGING — CR DG ABDOMEN 1V
1 series · 1 of 1 positions shown · non-contrast
Comparison: None.

CLINICAL DATA: Constipation.

EXAM:
ABDOMEN - 1 VIEW

[abdomen kub]
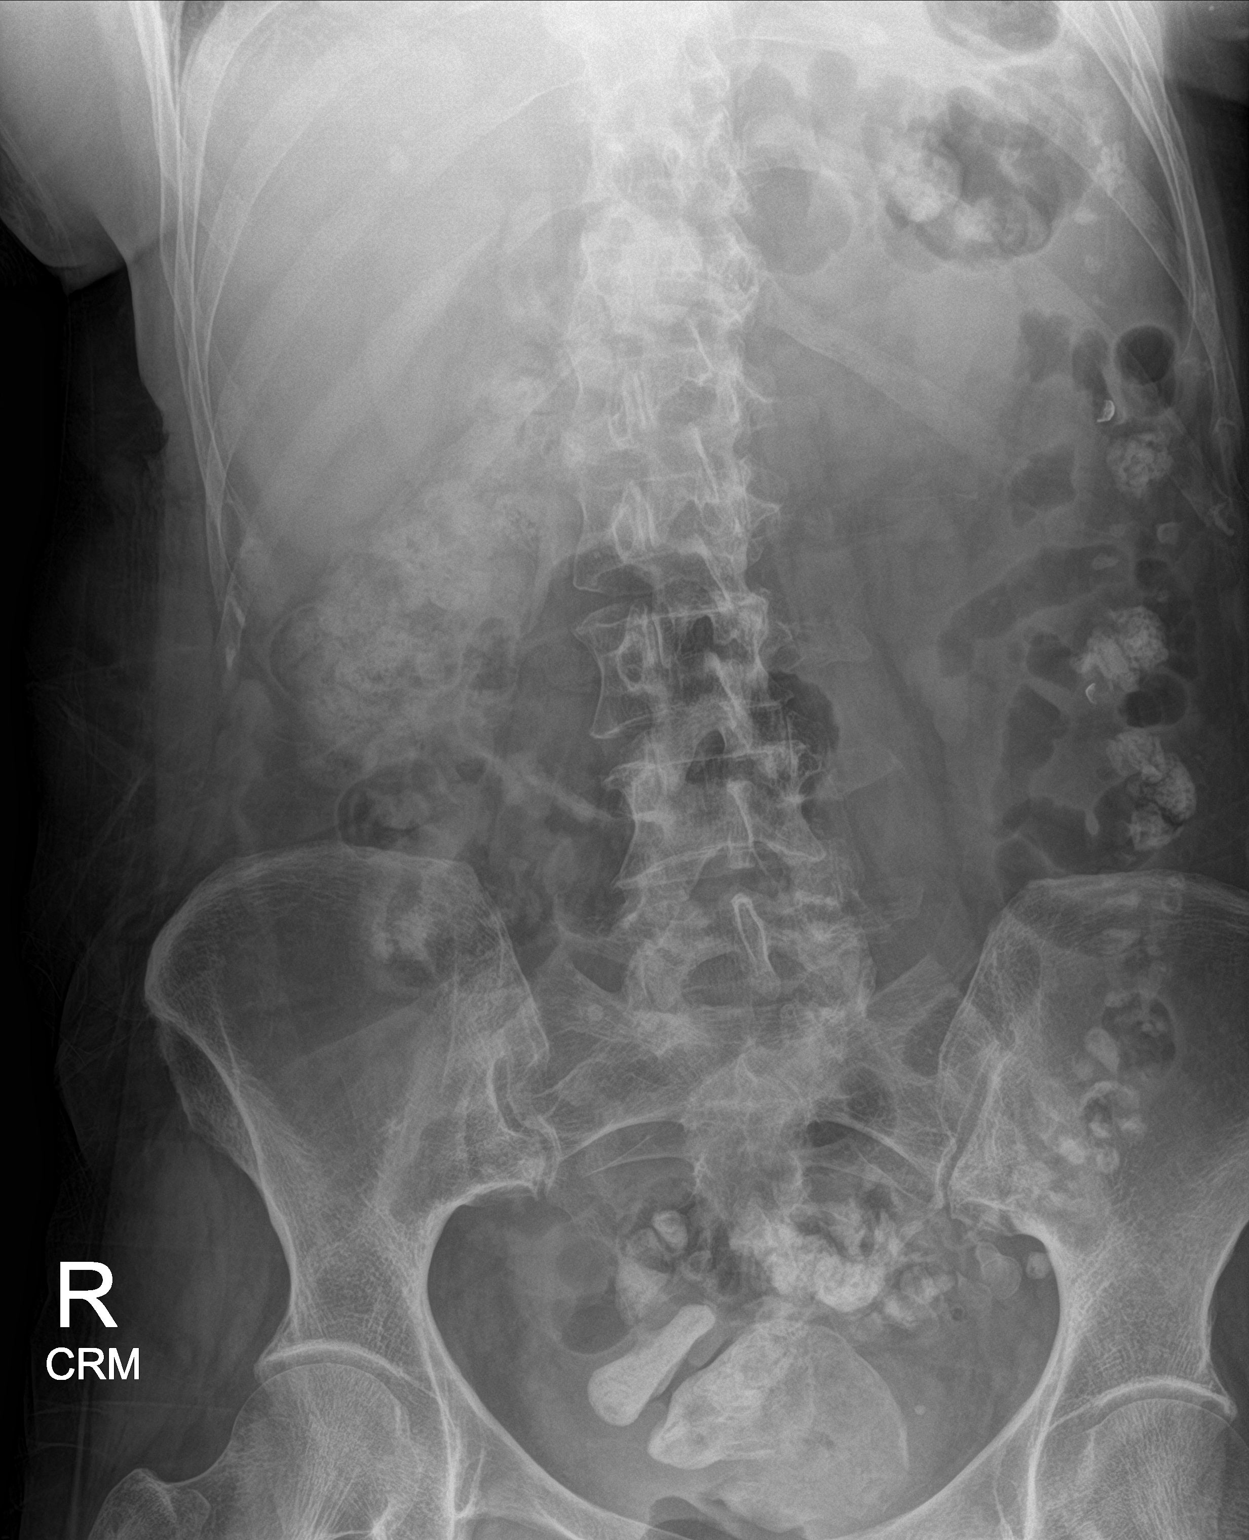

[1 of 1 positions shown; findings below may reference images not displayed]

FINDINGS: There is mild-to-moderate amount of stool mixed with contrast
throughout the colon. No bowel dilatation or evidence of
obstruction. Colonic diverticula noted. No free air. Degenerative
changes of the spine. No acute osseous pathology.
IMPRESSION: 1. No bowel obstruction.
2. Colonic diverticulosis.

## 2022-11-22 DIAGNOSIS — Z7689 Persons encountering health services in other specified circumstances: Secondary | ICD-10-CM | POA: Diagnosis not present

## 2022-11-22 DIAGNOSIS — Z8673 Personal history of transient ischemic attack (TIA), and cerebral infarction without residual deficits: Secondary | ICD-10-CM | POA: Diagnosis not present

## 2022-11-22 DIAGNOSIS — R1310 Dysphagia, unspecified: Secondary | ICD-10-CM | POA: Diagnosis not present

## 2022-12-16 DIAGNOSIS — L304 Erythema intertrigo: Secondary | ICD-10-CM

## 2022-12-16 DIAGNOSIS — L299 Pruritus, unspecified: Secondary | ICD-10-CM

## 2023-01-24 DIAGNOSIS — R52 Pain, unspecified: Secondary | ICD-10-CM | POA: Diagnosis not present

## 2023-01-24 DIAGNOSIS — G894 Chronic pain syndrome: Secondary | ICD-10-CM | POA: Diagnosis not present

## 2023-01-24 DIAGNOSIS — M1990 Unspecified osteoarthritis, unspecified site: Secondary | ICD-10-CM | POA: Diagnosis not present

## 2023-02-07 DIAGNOSIS — I119 Hypertensive heart disease without heart failure: Secondary | ICD-10-CM | POA: Diagnosis not present

## 2023-02-07 DIAGNOSIS — Z8673 Personal history of transient ischemic attack (TIA), and cerebral infarction without residual deficits: Secondary | ICD-10-CM | POA: Diagnosis not present

## 2023-02-07 DIAGNOSIS — H1131 Conjunctival hemorrhage, right eye: Secondary | ICD-10-CM | POA: Diagnosis not present

## 2023-02-28 DIAGNOSIS — R52 Pain, unspecified: Secondary | ICD-10-CM | POA: Diagnosis not present

## 2023-02-28 DIAGNOSIS — Z8673 Personal history of transient ischemic attack (TIA), and cerebral infarction without residual deficits: Secondary | ICD-10-CM | POA: Diagnosis not present

## 2023-02-28 DIAGNOSIS — G894 Chronic pain syndrome: Secondary | ICD-10-CM | POA: Diagnosis not present

## 2023-02-28 DIAGNOSIS — M79643 Pain in unspecified hand: Secondary | ICD-10-CM | POA: Diagnosis not present

## 2023-03-03 DIAGNOSIS — M6281 Muscle weakness (generalized): Secondary | ICD-10-CM

## 2023-03-03 DIAGNOSIS — Z8673 Personal history of transient ischemic attack (TIA), and cerebral infarction without residual deficits: Secondary | ICD-10-CM

## 2023-03-03 DIAGNOSIS — R52 Pain, unspecified: Secondary | ICD-10-CM

## 2023-03-03 DIAGNOSIS — R531 Weakness: Secondary | ICD-10-CM

## 2023-03-10 DIAGNOSIS — E1159 Type 2 diabetes mellitus with other circulatory complications: Secondary | ICD-10-CM | POA: Diagnosis not present

## 2023-03-10 DIAGNOSIS — R21 Rash and other nonspecific skin eruption: Secondary | ICD-10-CM | POA: Diagnosis not present

## 2023-03-10 DIAGNOSIS — E162 Hypoglycemia, unspecified: Secondary | ICD-10-CM | POA: Diagnosis not present

## 2023-03-10 DIAGNOSIS — L299 Pruritus, unspecified: Secondary | ICD-10-CM | POA: Diagnosis not present

## 2023-03-16 DIAGNOSIS — K219 Gastro-esophageal reflux disease without esophagitis: Secondary | ICD-10-CM | POA: Diagnosis not present

## 2023-03-16 DIAGNOSIS — L299 Pruritus, unspecified: Secondary | ICD-10-CM | POA: Diagnosis not present

## 2023-03-23 DIAGNOSIS — E162 Hypoglycemia, unspecified: Secondary | ICD-10-CM | POA: Diagnosis not present

## 2023-03-23 DIAGNOSIS — R5381 Other malaise: Secondary | ICD-10-CM | POA: Diagnosis not present

## 2023-03-24 DIAGNOSIS — R21 Rash and other nonspecific skin eruption: Secondary | ICD-10-CM | POA: Diagnosis not present

## 2023-03-28 DIAGNOSIS — R5381 Other malaise: Secondary | ICD-10-CM | POA: Diagnosis not present

## 2023-03-28 DIAGNOSIS — Z8673 Personal history of transient ischemic attack (TIA), and cerebral infarction without residual deficits: Secondary | ICD-10-CM | POA: Diagnosis not present

## 2023-03-28 DIAGNOSIS — I119 Hypertensive heart disease without heart failure: Secondary | ICD-10-CM | POA: Diagnosis not present

## 2023-04-06 DIAGNOSIS — R52 Pain, unspecified: Secondary | ICD-10-CM | POA: Diagnosis not present

## 2023-04-06 DIAGNOSIS — M25561 Pain in right knee: Secondary | ICD-10-CM | POA: Diagnosis not present

## 2023-04-06 DIAGNOSIS — M069 Rheumatoid arthritis, unspecified: Secondary | ICD-10-CM | POA: Diagnosis not present

## 2023-04-11 DIAGNOSIS — R42 Dizziness and giddiness: Secondary | ICD-10-CM | POA: Diagnosis not present

## 2023-04-11 DIAGNOSIS — N390 Urinary tract infection, site not specified: Secondary | ICD-10-CM | POA: Diagnosis not present

## 2023-04-11 DIAGNOSIS — W19XXXA Unspecified fall, initial encounter: Secondary | ICD-10-CM

## 2023-04-11 DIAGNOSIS — R2689 Other abnormalities of gait and mobility: Secondary | ICD-10-CM | POA: Diagnosis not present

## 2023-04-11 DIAGNOSIS — M6281 Muscle weakness (generalized): Secondary | ICD-10-CM | POA: Diagnosis not present

## 2023-04-11 DIAGNOSIS — Z8673 Personal history of transient ischemic attack (TIA), and cerebral infarction without residual deficits: Secondary | ICD-10-CM

## 2023-04-15 DIAGNOSIS — Z23 Encounter for immunization: Secondary | ICD-10-CM | POA: Diagnosis not present

## 2023-05-13 DEATH — deceased

## 2023-06-29 ENCOUNTER — Other Ambulatory Visit: Payer: Self-pay
